# Patient Record
Sex: Female | Born: 1945
Health system: Southern US, Community
[De-identification: ages and names within clinical notes are randomized; demographics above are authoritative.]

## PROBLEM LIST (undated history)

## (undated) DIAGNOSIS — M797 Fibromyalgia: Secondary | ICD-10-CM

## (undated) DIAGNOSIS — E039 Hypothyroidism, unspecified: Secondary | ICD-10-CM

## (undated) DIAGNOSIS — I10 Essential (primary) hypertension: Secondary | ICD-10-CM

## (undated) DIAGNOSIS — K862 Cyst of pancreas: Secondary | ICD-10-CM

## (undated) DIAGNOSIS — R5382 Chronic fatigue, unspecified: Secondary | ICD-10-CM

## (undated) DIAGNOSIS — I493 Ventricular premature depolarization: Secondary | ICD-10-CM

## (undated) DIAGNOSIS — H8109 Meniere's disease, unspecified ear: Secondary | ICD-10-CM

## (undated) DIAGNOSIS — R7303 Prediabetes: Secondary | ICD-10-CM

## (undated) DIAGNOSIS — Z973 Presence of spectacles and contact lenses: Secondary | ICD-10-CM

## (undated) DIAGNOSIS — N189 Chronic kidney disease, unspecified: Secondary | ICD-10-CM

## (undated) DIAGNOSIS — E785 Hyperlipidemia, unspecified: Secondary | ICD-10-CM

## (undated) DIAGNOSIS — Z972 Presence of dental prosthetic device (complete) (partial): Secondary | ICD-10-CM

## (undated) DIAGNOSIS — K0381 Cracked tooth: Secondary | ICD-10-CM

## (undated) DIAGNOSIS — N950 Postmenopausal bleeding: Secondary | ICD-10-CM

## (undated) DIAGNOSIS — K760 Fatty (change of) liver, not elsewhere classified: Secondary | ICD-10-CM

## (undated) DIAGNOSIS — Z8673 Personal history of transient ischemic attack (TIA), and cerebral infarction without residual deficits: Secondary | ICD-10-CM

## (undated) DIAGNOSIS — Z794 Long term (current) use of insulin: Secondary | ICD-10-CM

## (undated) DIAGNOSIS — E119 Type 2 diabetes mellitus without complications: Secondary | ICD-10-CM

## (undated) DIAGNOSIS — R32 Unspecified urinary incontinence: Secondary | ICD-10-CM

## (undated) DIAGNOSIS — G4733 Obstructive sleep apnea (adult) (pediatric): Secondary | ICD-10-CM

## (undated) DIAGNOSIS — J45909 Unspecified asthma, uncomplicated: Secondary | ICD-10-CM

## (undated) DIAGNOSIS — Z8679 Personal history of other diseases of the circulatory system: Secondary | ICD-10-CM

## (undated) DIAGNOSIS — K219 Gastro-esophageal reflux disease without esophagitis: Secondary | ICD-10-CM

## (undated) DIAGNOSIS — K449 Diaphragmatic hernia without obstruction or gangrene: Secondary | ICD-10-CM

## (undated) DIAGNOSIS — D3502 Benign neoplasm of left adrenal gland: Secondary | ICD-10-CM

## (undated) DIAGNOSIS — F32A Depression, unspecified: Secondary | ICD-10-CM

## (undated) DIAGNOSIS — K589 Irritable bowel syndrome without diarrhea: Secondary | ICD-10-CM

## (undated) DIAGNOSIS — M1990 Unspecified osteoarthritis, unspecified site: Secondary | ICD-10-CM

## (undated) DIAGNOSIS — R600 Localized edema: Secondary | ICD-10-CM

## (undated) DIAGNOSIS — G9332 Myalgic encephalomyelitis/chronic fatigue syndrome: Secondary | ICD-10-CM

## (undated) DIAGNOSIS — T7840XA Allergy, unspecified, initial encounter: Secondary | ICD-10-CM

## (undated) DIAGNOSIS — G4730 Sleep apnea, unspecified: Secondary | ICD-10-CM

## (undated) DIAGNOSIS — Z860101 Personal history of adenomatous and serrated colon polyps: Secondary | ICD-10-CM

## (undated) DIAGNOSIS — M8580 Other specified disorders of bone density and structure, unspecified site: Secondary | ICD-10-CM

## (undated) DIAGNOSIS — N183 Chronic kidney disease, stage 3 unspecified: Secondary | ICD-10-CM

## (undated) DIAGNOSIS — H269 Unspecified cataract: Secondary | ICD-10-CM

## (undated) DIAGNOSIS — I639 Cerebral infarction, unspecified: Secondary | ICD-10-CM

## (undated) DIAGNOSIS — Z86010 Personal history of colonic polyps: Secondary | ICD-10-CM

## (undated) DIAGNOSIS — K0889 Other specified disorders of teeth and supporting structures: Secondary | ICD-10-CM

## (undated) HISTORY — DX: Fibromyalgia: M79.7

## (undated) HISTORY — DX: Unspecified asthma, uncomplicated: J45.909

## (undated) HISTORY — DX: Depression, unspecified: F32.A

## (undated) HISTORY — DX: Irritable bowel syndrome, unspecified: K58.9

## (undated) HISTORY — DX: Type 2 diabetes mellitus without complications: E11.9

## (undated) HISTORY — PX: TUBAL LIGATION: SHX77

## (undated) HISTORY — DX: Meniere's disease, unspecified ear: H81.09

## (undated) HISTORY — DX: Hyperlipidemia, unspecified: E78.5

## (undated) HISTORY — DX: Sleep apnea, unspecified: G47.30

## (undated) HISTORY — DX: Unspecified cataract: H26.9

## (undated) HISTORY — DX: Chronic kidney disease, unspecified: N18.9

## (undated) HISTORY — DX: Cerebral infarction, unspecified: I63.9

## (undated) HISTORY — DX: Chronic fatigue, unspecified: R53.82

## (undated) HISTORY — DX: Gastro-esophageal reflux disease without esophagitis: K21.9

## (undated) HISTORY — DX: Myalgic encephalomyelitis/chronic fatigue syndrome: G93.32

## (undated) HISTORY — PX: HERNIA REPAIR: SHX51

## (undated) HISTORY — DX: Other specified disorders of bone density and structure, unspecified site: M85.80

## (undated) HISTORY — DX: Allergy, unspecified, initial encounter: T78.40XA

---

## 1968-11-27 DIAGNOSIS — T7840XA Allergy, unspecified, initial encounter: Secondary | ICD-10-CM | POA: Insufficient documentation

## 1993-03-29 HISTORY — PX: CHOLECYSTECTOMY: SHX55

## 2000-07-12 ENCOUNTER — Encounter: Payer: Self-pay | Admitting: Family Medicine

## 2000-07-12 ENCOUNTER — Ambulatory Visit (HOSPITAL_COMMUNITY): Admission: RE | Admit: 2000-07-12 | Discharge: 2000-07-12 | Payer: Self-pay | Admitting: Family Medicine

## 2001-11-09 ENCOUNTER — Encounter: Payer: Self-pay | Admitting: Family Medicine

## 2001-11-09 ENCOUNTER — Ambulatory Visit (HOSPITAL_COMMUNITY): Admission: RE | Admit: 2001-11-09 | Discharge: 2001-11-09 | Payer: Self-pay | Admitting: Family Medicine

## 2001-11-16 ENCOUNTER — Ambulatory Visit: Admission: RE | Admit: 2001-11-16 | Discharge: 2001-11-16 | Payer: Self-pay | Admitting: Family Medicine

## 2002-11-14 HISTORY — PX: HYSTEROSCOPY WITH D & C: SHX1775

## 2003-02-08 HISTORY — PX: ESOPHAGOGASTRODUODENOSCOPY: SHX1529

## 2004-01-29 HISTORY — PX: LIVER BIOPSY: SHX301

## 2005-03-29 DIAGNOSIS — F909 Attention-deficit hyperactivity disorder, unspecified type: Secondary | ICD-10-CM

## 2005-03-29 HISTORY — DX: Attention-deficit hyperactivity disorder, unspecified type: F90.9

## 2009-05-06 ENCOUNTER — Ambulatory Visit (HOSPITAL_COMMUNITY): Admission: RE | Admit: 2009-05-06 | Discharge: 2009-05-06 | Payer: Self-pay | Admitting: Family Medicine

## 2009-05-15 ENCOUNTER — Ambulatory Visit (HOSPITAL_COMMUNITY): Admission: RE | Admit: 2009-05-15 | Discharge: 2009-05-15 | Payer: Self-pay | Admitting: Family Medicine

## 2010-05-07 ENCOUNTER — Other Ambulatory Visit (HOSPITAL_COMMUNITY): Payer: Self-pay | Admitting: Family Medicine

## 2010-05-07 DIAGNOSIS — Z139 Encounter for screening, unspecified: Secondary | ICD-10-CM

## 2010-05-14 ENCOUNTER — Ambulatory Visit (HOSPITAL_COMMUNITY)
Admission: RE | Admit: 2010-05-14 | Discharge: 2010-05-14 | Disposition: A | Discharge status: Home / Self Care | Payer: Medicare HMO | Source: Ambulatory Visit | Attending: Family Medicine | Admitting: Family Medicine

## 2010-05-14 ENCOUNTER — Other Ambulatory Visit (HOSPITAL_COMMUNITY): Payer: Self-pay | Admitting: Family Medicine

## 2010-05-14 DIAGNOSIS — R0600 Dyspnea, unspecified: Secondary | ICD-10-CM

## 2010-05-14 DIAGNOSIS — Z139 Encounter for screening, unspecified: Secondary | ICD-10-CM

## 2010-05-14 DIAGNOSIS — Z1231 Encounter for screening mammogram for malignant neoplasm of breast: Secondary | ICD-10-CM | POA: Insufficient documentation

## 2011-07-27 ENCOUNTER — Telehealth: Payer: Self-pay

## 2011-07-27 NOTE — Telephone Encounter (Signed)
Pt called to schedule colonoscopy. Referred by Dr. Sallee Lange and Pearson Forster, NP. She has family history of colon rectal cancer in her brother. She also has diarrhea and constipation due to IBS. She is scheduled for OV with Vickey Huger on 08/03/2011 @ 2:30 PM.

## 2011-08-03 ENCOUNTER — Ambulatory Visit: Payer: Medicare HMO | Admitting: Urgent Care

## 2011-08-19 ENCOUNTER — Ambulatory Visit (INDEPENDENT_AMBULATORY_CARE_PROVIDER_SITE_OTHER): Payer: Medicare Other | Admitting: Otolaryngology

## 2011-08-19 DIAGNOSIS — H6980 Other specified disorders of Eustachian tube, unspecified ear: Secondary | ICD-10-CM

## 2011-08-19 DIAGNOSIS — H902 Conductive hearing loss, unspecified: Secondary | ICD-10-CM

## 2011-08-19 DIAGNOSIS — H6520 Chronic serous otitis media, unspecified ear: Secondary | ICD-10-CM

## 2011-08-19 DIAGNOSIS — H8109 Meniere's disease, unspecified ear: Secondary | ICD-10-CM

## 2011-09-02 ENCOUNTER — Ambulatory Visit (INDEPENDENT_AMBULATORY_CARE_PROVIDER_SITE_OTHER): Payer: Medicare Other | Admitting: Otolaryngology

## 2011-09-02 DIAGNOSIS — H6980 Other specified disorders of Eustachian tube, unspecified ear: Secondary | ICD-10-CM

## 2011-09-02 DIAGNOSIS — H902 Conductive hearing loss, unspecified: Secondary | ICD-10-CM

## 2011-09-02 DIAGNOSIS — H6520 Chronic serous otitis media, unspecified ear: Secondary | ICD-10-CM

## 2011-09-02 DIAGNOSIS — J310 Chronic rhinitis: Secondary | ICD-10-CM

## 2011-09-06 ENCOUNTER — Other Ambulatory Visit: Payer: Self-pay | Admitting: Nurse Practitioner

## 2011-09-06 DIAGNOSIS — Z139 Encounter for screening, unspecified: Secondary | ICD-10-CM

## 2011-09-07 ENCOUNTER — Inpatient Hospital Stay (HOSPITAL_COMMUNITY): Admission: RE | Admit: 2011-09-07 | Payer: Medicare Other | Source: Ambulatory Visit

## 2011-10-04 ENCOUNTER — Ambulatory Visit (INDEPENDENT_AMBULATORY_CARE_PROVIDER_SITE_OTHER): Payer: Medicare Other | Admitting: Urgent Care

## 2011-10-04 ENCOUNTER — Encounter: Payer: Self-pay | Admitting: Urgent Care

## 2011-10-04 ENCOUNTER — Other Ambulatory Visit: Payer: Self-pay | Admitting: Gastroenterology

## 2011-10-04 VITALS — BP 130/80 | HR 76 | Temp 98.2°F | Ht 60.0 in | Wt 211.6 lb

## 2011-10-04 DIAGNOSIS — K589 Irritable bowel syndrome without diarrhea: Secondary | ICD-10-CM | POA: Insufficient documentation

## 2011-10-04 DIAGNOSIS — Z800 Family history of malignant neoplasm of digestive organs: Secondary | ICD-10-CM

## 2011-10-04 DIAGNOSIS — K219 Gastro-esophageal reflux disease without esophagitis: Secondary | ICD-10-CM | POA: Insufficient documentation

## 2011-10-04 MED ORDER — PEG 3350-KCL-NA BICARB-NACL 420 G PO SOLR: ORAL | Status: AC

## 2011-10-04 MED ORDER — PANTOPRAZOLE SODIUM 40 MG PO TBEC: 40.0000 mg | DELAYED_RELEASE_TABLET | Freq: Every day | ORAL | Status: DC

## 2011-10-04 NOTE — Assessment & Plan Note (Signed)
Due for high-risk screening colonoscopy.  I have discussed risks & benefits which include, but are not limited to, bleeding, infection, perforation & drug reaction.  The patient agrees with this plan & written consent will be obtained.

## 2011-10-04 NOTE — Progress Notes (Signed)
Referring Provider: Kathyrn Drown, MD Primary Care Physician:  Sallee Lange, MD Primary Gastroenterologist:  Dr. Barney Drain  Chief Complaint  Patient presents with  . Colonoscopy    Family Hx colobn ca (brother)  . Gastrophageal Reflux    HPI:  Kristen Mack is a 66 y.o. female here as a referral from Dr. Wolfgang Phoenix for high-risk screening colonoscopy.   Denies any lower GI symptoms including constipation, diarrhea, rectal bleeding, melena or weight loss.  She gives hx of IBS lifelong.  She has bloating after eating.  She wants to be tested for celiac disease.  She also tells me she has been off PPI for almost 5 years.  More recently she has been having daily heartburn & indigestion.  She has been taking "stomach soothe" herbal supplement.  2 hrs after eating, she has lots of reflux.  C/o intermittent nausea & feels she may have gastroparesis.  No recent vomiting.  She has also tried digestive enzymes & papaya.  Reflux wakes her up in the middle of the night.  If she lies on her right side, she has regurgitation.  Denies dysphagia.  She had an EGD many years ago & believes she had "inflammation"at that time.  C/o lots of belching & sulfer-smelling rotten belches. Weight has steadily increased.  Appetite is ok.  Denies dysphagia or odynophagia.  Reports good relief w/ prevacid in the past.  Tried zantac & omeprazole previously but felt prevacid worked the best as far as she can remember.  Past Medical History  Diagnosis Date  . HTN (hypertension)   . Hyperglycemia   . GERD (gastroesophageal reflux disease)   . Asthma   . Bronchitis   . Hypothyroid   . CFS (chronic fatigue syndrome)   . Fibromyalgia     Past Surgical History  Procedure Date  . Cesarean section     x 2  . Cholecystectomy   . Esophagogastroduodenoscopy     Dr. Inda Castle ([place]) erosive esophagitis?  . Dilation and curettage of uterus     Current Outpatient Prescriptions  Medication Sig Dispense Refill  .  albuterol (PROVENTIL HFA;VENTOLIN HFA) 108 (90 BASE) MCG/ACT inhaler Inhale 2 puffs into the lungs every 6 (six) hours as needed.      . citalopram (CELEXA) 40 MG tablet Take 40 mg by mouth as needed.       . fluticasone (FLONASE) 50 MCG/ACT nasal spray Place 1 spray into the nose as needed.       Marland Kitchen ipratropium (ATROVENT HFA) 17 MCG/ACT inhaler Inhale 2 puffs into the lungs every 6 (six) hours.      Marland Kitchen ipratropium (ATROVENT) 0.02 % nebulizer solution Take 500 mcg by nebulization 4 (four) times daily.      Marland Kitchen levothyroxine (SYNTHROID, LEVOTHROID) 150 MCG tablet Take 150 mcg by mouth daily.       Marland Kitchen losartan (COZAAR) 50 MG tablet Take 100 mg by mouth daily.       . montelukast (SINGULAIR) 10 MG tablet Take 10 mg by mouth at bedtime.       . potassium chloride SA (K-DUR,KLOR-CON) 20 MEQ tablet Take 20 mEq by mouth daily.      . verapamil (VERELAN PM) 180 MG 24 hr capsule Take 180 mg by mouth at bedtime.       . pantoprazole (PROTONIX) 40 MG tablet Take 1 tablet (40 mg total) by mouth daily.  31 tablet  11  . polyethylene glycol-electrolytes (TRILYTE) 420 G solution Use as directed Also buy  1 fleet enema & 4 dulcolax tablets to use as directed  4000 mL  0    Allergies as of 10/04/2011 - Review Complete 10/04/2011  Allergen Reaction Noted  . Codeine Itching and Nausea And Vomiting 10/04/2011  . Sulfa antibiotics Other (See Comments) 10/04/2011    Family History  Problem Relation Age of Onset  . Rectal cancer Brother 75  . Ovarian cancer Mother 65    History   Social History  . Marital Status: Married    Spouse Name: N/A    Number of Children: 2  . Years of Education: N/A   Occupational History  . retired, Therapist, sports    Social History Main Topics  . Smoking status: Former Smoker -- 0.5 packs/day    Types: Cigarettes    Quit date: 10/04/1971  . Smokeless tobacco: Not on file   Comment: quit years ago  . Alcohol Use: No  . Drug Use: No  . Sexually Active: Not on file  Review of  Systems: Gen: Denies any fever, chills, sweats, anorexia, fatigue, weakness, malaise, weight loss, and sleep disorder CV: wt gain 6#, increased LEE since prednisone.  Denies chest pain, angina, palpitations, syncope, orthopnea, PND, and claudication. Resp: Denies dyspnea at rest, dyspnea with exercise, cough, sputum, wheezing, coughing up blood, and pleurisy. GI: Denies vomiting blood, jaundice, and fecal incontinence.   Denies dysphagia or odynophagia. GU : Denies urinary burning, blood in urine, urinary frequency, urinary hesitancy, nocturnal urination, and urinary incontinence. MS: Denies joint pain, limitation of movement, and swelling, stiffness, low back pain, extremity pain. Denies muscle weakness, cramps, atrophy.  Derm: Denies rash, itching, dry skin, hives, moles, warts, or unhealing ulcers.  Psych: Denies depression, anxiety, memory loss, suicidal ideation, hallucinations, paranoia, and confusion. Heme: Denies bruising, bleeding, and enlarged lymph nodes. Neuro:  Denies any headaches, dizziness, paresthesias. Endo:  Thyroid meds recently adjusted, problems w/ hyperglycemia, recent steroids, "borderline" DM  Physical Exam: BP 130/80  Pulse 76  Temp 98.2 F (36.8 C) (Temporal)  Ht 5' (1.524 m)  Wt 211 lb 9.6 oz (95.981 kg)  BMI 41.33 kg/m2 General:   Alert,  Well-developed, obese, pleasant and cooperative in NAD Head:  Normocephalic and atraumatic. Eyes:  Sclera clear, no icterus.   Conjunctiva pink. Ears:  Normal auditory acuity. Nose:  No deformity, discharge, or lesions. Mouth:  No deformity or lesions,oropharynx pink & moist. Neck:  Supple; no masses or thyromegaly. Lungs:  Clear throughout to auscultation.   No wheezes, crackles, or rhonchi. No acute distress. Heart:  Regular rate and rhythm; no murmurs, clicks, rubs,  or gallops. Abdomen:  Obese.  Normal bowel sounds.  No bruits.  Soft, non-tender and non-distended without masses, hepatosplenomegaly or hernias noted.  No  guarding or rebound tenderness.  Exam limited due to body habitus. Rectal:  Deferred. Msk:  Symmetrical without gross deformities. Normal posture. Pulses:  Normal pulses noted. Extremities:  Trace bilateral pretibial edema. Neurologic:  Alert and oriented x4;  grossly normal neurologically. Skin:  Intact without significant lesions or rashes. Lymph Nodes:  No significant cervical adenopathy. Psych:  Alert and cooperative. Normal mood and affect.

## 2011-10-04 NOTE — Assessment & Plan Note (Signed)
Chronic IBS with bloating & post-prandial component.  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  I fell this is warranted.  No alarm features at this time.   TTG IgA

## 2011-10-04 NOTE — Assessment & Plan Note (Addendum)
Hx of GERD years ago, however recent severe daily symptoms, along with chronic nausea, warrant further evaluation w/ EGD to look for PUD, gastritis, gastroparesis, or malignancy. Increased weight may be exacerbating GERD.  I have discussed risks & benefits which include, but are not limited to, bleeding, infection, perforation & drug reaction.  The patient agrees with this plan & written consent will be obtained.    Begin pantoprazole 40 mg 30 minutes before breakfast GERD diet Weight loss

## 2011-10-04 NOTE — Patient Instructions (Addendum)
Go to lab as soon as possible Begin pantoprazole 40 mg 30 minutes before breakfast GERD diet EGD & colonoscopy w/ Dr Oneida Alar Diet for GERD or PUD Nutrition therapy can help ease the discomfort of gastroesophageal reflux disease (GERD) and peptic ulcer disease (PUD).  HOME CARE INSTRUCTIONS   Eat your meals slowly, in a relaxed setting.   Eat 5 to 6 small meals per day.   If a food causes distress, stop eating it for a period of time.  FOODS TO AVOID  Coffee, regular or decaffeinated.   Cola beverages, regular or low calorie.   Tea, regular or decaffeinated.   Pepper.   Cocoa.   High fat foods, including meats.   Butter, margarine, hydrogenated oil (trans fats).   Peppermint or spearmint (if you have GERD).   Fruits and vegetables if not tolerated.   Alcohol.   Nicotine (smoking or chewing). This is one of the most potent stimulants to acid production in the gastrointestinal tract.   Any food that seems to aggravate your condition.  If you have questions regarding your diet, ask your caregiver or a registered dietitian. TIPS  Lying flat may make symptoms worse. Keep the head of your bed raised 6 to 9 inches (15 to 23 cm) by using a foam wedge or blocks under the legs of the bed.   Do not lay down until 3 hours after eating a meal.   Daily physical activity may help reduce symptoms.  MAKE SURE YOU:   Understand these instructions.   Will watch your condition.   Will get help right away if you are not doing well or get worse.  Document Released: 03/15/2005 Document Revised: 03/04/2011 Document Reviewed: 01/29/2011 [facility] Patient Information 2012 [place].

## 2011-10-04 NOTE — Progress Notes (Signed)
Faxed to PCP

## 2011-10-05 ENCOUNTER — Encounter: Payer: Self-pay | Admitting: Urgent Care

## 2011-10-05 ENCOUNTER — Telehealth: Payer: Self-pay

## 2011-10-05 LAB — TISSUE TRANSGLUTAMINASE, IGA: Tissue Transglutaminase Ab, IgA: 6.1 U/mL (ref <20)

## 2011-10-05 NOTE — Telephone Encounter (Signed)
Pt called and said Brennan Bailey was supposed to have sent her prescription for Pantoprazole to [facility] in [place] yesterday and they do not have it. I called it in verbally to [facility] at [facility].

## 2011-10-05 NOTE — Telephone Encounter (Signed)
Records reviewed from [facility] updated

## 2011-10-05 NOTE — Progress Notes (Signed)
Quick Note:  Please call the patient & let her know there is no evidence of celiac disease based on her labs. CC: LUKING,SCOTT, MD  ______

## 2011-10-05 NOTE — Telephone Encounter (Signed)
thanks

## 2011-10-06 NOTE — Progress Notes (Signed)
Quick Note:  Called and informed pt. ______

## 2011-10-07 ENCOUNTER — Ambulatory Visit (INDEPENDENT_AMBULATORY_CARE_PROVIDER_SITE_OTHER): Payer: Medicare Other | Admitting: Otolaryngology

## 2011-10-07 DIAGNOSIS — H8109 Meniere's disease, unspecified ear: Secondary | ICD-10-CM

## 2011-10-07 DIAGNOSIS — H7200 Central perforation of tympanic membrane, unspecified ear: Secondary | ICD-10-CM

## 2011-10-07 DIAGNOSIS — H6980 Other specified disorders of Eustachian tube, unspecified ear: Secondary | ICD-10-CM

## 2011-10-14 ENCOUNTER — Encounter (HOSPITAL_COMMUNITY): Payer: Self-pay | Admitting: Pharmacy Technician

## 2011-10-25 ENCOUNTER — Other Ambulatory Visit: Payer: Self-pay | Admitting: Gastroenterology

## 2011-10-25 ENCOUNTER — Telehealth: Payer: Self-pay | Admitting: Gastroenterology

## 2011-10-25 MED ORDER — PEG 3350-KCL-NA BICARB-NACL 420 G PO SOLR: ORAL | Status: AC

## 2011-10-25 NOTE — Telephone Encounter (Signed)
Pt called to let us know that she has lost her rx of Tri-Lyte and needs another one. I told her we would fax it to her pharmacy. She uses [facility] in [place]

## 2011-10-25 NOTE — Telephone Encounter (Signed)
Rx faxed to pharmacy  

## 2011-10-27 ENCOUNTER — Telehealth: Payer: Self-pay | Admitting: Gastroenterology

## 2011-10-27 NOTE — Telephone Encounter (Signed)
Patient called and cancelled her procedure for Friday 08/02 and she said she will call us back to R/S

## 2011-10-29 ENCOUNTER — Ambulatory Visit (HOSPITAL_COMMUNITY): Admission: RE | Admit: 2011-10-29 | Payer: Medicare Other | Source: Ambulatory Visit | Admitting: Gastroenterology

## 2011-10-29 ENCOUNTER — Encounter (HOSPITAL_COMMUNITY): Admission: RE | Payer: Self-pay | Source: Ambulatory Visit

## 2011-10-29 SURGERY — COLONOSCOPY WITH ESOPHAGOGASTRODUODENOSCOPY (EGD)
Anesthesia: Moderate Sedation

## 2011-11-16 NOTE — Progress Notes (Signed)
REVIEWED.  

## 2012-04-27 ENCOUNTER — Ambulatory Visit (INDEPENDENT_AMBULATORY_CARE_PROVIDER_SITE_OTHER): Payer: Medicare Other | Admitting: Otolaryngology

## 2012-04-27 DIAGNOSIS — H6120 Impacted cerumen, unspecified ear: Secondary | ICD-10-CM

## 2012-04-27 DIAGNOSIS — H7200 Central perforation of tympanic membrane, unspecified ear: Secondary | ICD-10-CM

## 2012-04-27 DIAGNOSIS — H8109 Meniere's disease, unspecified ear: Secondary | ICD-10-CM

## 2012-04-30 DIAGNOSIS — R32 Unspecified urinary incontinence: Secondary | ICD-10-CM | POA: Insufficient documentation

## 2012-06-26 ENCOUNTER — Other Ambulatory Visit: Payer: Self-pay | Admitting: Family Medicine

## 2012-06-27 LAB — TSH: TSH: 0.082 u[IU]/mL — ABNORMAL LOW (ref 0.350–4.500)

## 2012-07-01 ENCOUNTER — Encounter: Payer: Self-pay | Admitting: *Deleted

## 2012-07-06 ENCOUNTER — Encounter: Payer: Self-pay | Admitting: Nurse Practitioner

## 2012-07-06 ENCOUNTER — Ambulatory Visit (INDEPENDENT_AMBULATORY_CARE_PROVIDER_SITE_OTHER): Payer: Medicare Other | Admitting: Nurse Practitioner

## 2012-07-06 VITALS — BP 152/90 | Temp 90.0°F | Ht 60.0 in | Wt 205.0 lb

## 2012-07-06 DIAGNOSIS — Z0000 Encounter for general adult medical examination without abnormal findings: Secondary | ICD-10-CM

## 2012-07-06 DIAGNOSIS — M8580 Other specified disorders of bone density and structure, unspecified site: Secondary | ICD-10-CM

## 2012-07-06 DIAGNOSIS — M949 Disorder of cartilage, unspecified: Secondary | ICD-10-CM

## 2012-07-06 DIAGNOSIS — Z139 Encounter for screening, unspecified: Secondary | ICD-10-CM

## 2012-07-06 MED ORDER — MONTELUKAST SODIUM 10 MG PO TABS: 10.0000 mg | ORAL_TABLET | Freq: Every day | ORAL | Status: DC

## 2012-07-06 MED ORDER — IPRATROPIUM BROMIDE HFA 17 MCG/ACT IN AERS: 2.0000 | INHALATION_SPRAY | Freq: Four times a day (QID) | RESPIRATORY_TRACT | Status: DC

## 2012-07-06 NOTE — Progress Notes (Signed)
Pt requests to make own app. For bone density and mammogram.

## 2012-07-07 ENCOUNTER — Telehealth: Payer: Self-pay | Admitting: Family Medicine

## 2012-07-07 ENCOUNTER — Other Ambulatory Visit: Payer: Self-pay | Admitting: *Deleted

## 2012-07-07 MED ORDER — IPRATROPIUM BROMIDE 0.02 % IN SOLN: 500.0000 ug | Freq: Four times a day (QID) | RESPIRATORY_TRACT | Status: DC | PRN

## 2012-07-07 NOTE — Telephone Encounter (Signed)
Pt notified neb med sent to [facility] with 5 refills

## 2012-07-07 NOTE — Telephone Encounter (Signed)
Patient says that she came in here yesterday for her appointment and had some medications called in, but the nurse called in the wrong prescription. Instead of Atrovent nebulizer being called in, the atrovent inhaler was called in. Please advise. [facility] in [place].

## 2012-07-08 ENCOUNTER — Encounter: Payer: Self-pay | Admitting: Nurse Practitioner

## 2012-07-08 DIAGNOSIS — M8580 Other specified disorders of bone density and structure, unspecified site: Secondary | ICD-10-CM | POA: Insufficient documentation

## 2012-07-08 NOTE — Progress Notes (Signed)
Subjective:  Presents for her preventive health physical. No pelvic pain. No vaginal bleeding. Same sexual partner. No vaginal discharge. Gets regular eye and dental exams. Takes some calcium and vitamin D. Defers Pneumovax and Zostavax vaccines. Does fairly well her diet. Limited exercise.  Objective:   BP 152/90  Temp(Src) 90 F (32.2 C)  Ht 5' (1.524 m)  Wt 205 lb (92.987 kg)  BMI 40.04 kg/m2 NAD. Alert, oriented. TMs mild clear effusion, no erythema. Pharynx clear. Neck supple with minimal adenopathy. Thyroid normal to palpation. Lungs clear. Heart regular rate rhythm. Abdomen soft nondistended nontender. Breast exam normal limit, axilla no adenopathy. EGBUS normal. Bimanual exam normal limit, limited due to abdominal girth. See labs dated 03/10/12.  Assessment: Preventive health physical Morbid obesity Osteopenia Plan: Patient to call to schedule her colonoscopy. Had to reschedule her last one due to her husband's illness. Schedule mammogram and bone density study. Discussed importance of healthy diet activity and weight loss. Recheck 3 months, call back sooner if any problems.

## 2012-07-24 ENCOUNTER — Other Ambulatory Visit: Payer: Self-pay | Admitting: Family Medicine

## 2012-08-30 ENCOUNTER — Other Ambulatory Visit: Payer: Self-pay | Admitting: Family Medicine

## 2012-10-10 ENCOUNTER — Telehealth: Payer: Self-pay | Admitting: Family Medicine

## 2012-10-10 DIAGNOSIS — E119 Type 2 diabetes mellitus without complications: Secondary | ICD-10-CM

## 2012-10-10 DIAGNOSIS — E782 Mixed hyperlipidemia: Secondary | ICD-10-CM

## 2012-10-10 DIAGNOSIS — Z79899 Other long term (current) drug therapy: Secondary | ICD-10-CM

## 2012-10-10 NOTE — Telephone Encounter (Signed)
Lip/liv/hgA1C/met 7

## 2012-10-10 NOTE — Telephone Encounter (Signed)
Patient needs BW paper

## 2012-10-10 NOTE — Telephone Encounter (Signed)
Blood work papers printed and left up front for patient pick up. Patient notified.

## 2012-10-10 NOTE — Telephone Encounter (Signed)
Please call patient when complete

## 2012-10-10 NOTE — Telephone Encounter (Signed)
Had TSH and Vit D labs on 06/26/12

## 2012-10-11 ENCOUNTER — Telehealth: Payer: Self-pay | Admitting: Family Medicine

## 2012-10-11 ENCOUNTER — Other Ambulatory Visit: Payer: Self-pay | Admitting: Family Medicine

## 2012-10-11 ENCOUNTER — Other Ambulatory Visit: Payer: Self-pay

## 2012-10-11 DIAGNOSIS — E039 Hypothyroidism, unspecified: Secondary | ICD-10-CM

## 2012-10-11 LAB — LIPID PANEL
LDL Cholesterol: 115 mg/dL — ABNORMAL HIGH (ref 0–99)
Triglycerides: 104 mg/dL (ref <150)

## 2012-10-11 LAB — HEPATIC FUNCTION PANEL
AST: 49 U/L — ABNORMAL HIGH (ref 0–37)
Albumin: 3.8 g/dL (ref 3.5–5.2)
Bilirubin, Direct: 0.1 mg/dL (ref 0.0–0.3)
Total Bilirubin: 0.5 mg/dL (ref 0.3–1.2)

## 2012-10-11 LAB — BASIC METABOLIC PANEL
BUN: 12 mg/dL (ref 6–23)
Potassium: 3.8 mEq/L (ref 3.5–5.3)
Sodium: 140 mEq/L (ref 135–145)

## 2012-10-11 LAB — HEMOGLOBIN A1C
Hgb A1c MFr Bld: 5.7 % — ABNORMAL HIGH (ref <5.7)
Mean Plasma Glucose: 117 mg/dL — ABNORMAL HIGH (ref <117)

## 2012-10-11 NOTE — Telephone Encounter (Signed)
Left message on voicemail notifying patient lab papers ready for pickup.

## 2012-10-11 NOTE — Telephone Encounter (Signed)
Pt is wanting to add an THS to her labs, can we please do so? Dr Nicki Reaper did not order this on her labs and pt thinks she should have one run since he changed her Thyroid meds 3 months ago.  Dr Nicki Reaper typically will run this after a change to decide if she needs to continue with current dose, stop dose all together or change? Please advise of add lab, pt was trying to get this done this morning.

## 2012-10-11 NOTE — Telephone Encounter (Signed)
Add tsh

## 2012-10-11 NOTE — Telephone Encounter (Signed)
Pt states she will run out of her current thyroid meds in four days, should she just get a refill on her current dose if Dr Richardson Landry does not want to add the THS testing?

## 2012-10-23 ENCOUNTER — Encounter: Payer: Self-pay | Admitting: Family Medicine

## 2012-10-23 ENCOUNTER — Ambulatory Visit (INDEPENDENT_AMBULATORY_CARE_PROVIDER_SITE_OTHER): Payer: Self-pay | Admitting: Family Medicine

## 2012-10-23 VITALS — BP 118/78 | Wt 211.0 lb

## 2012-10-23 DIAGNOSIS — E039 Hypothyroidism, unspecified: Secondary | ICD-10-CM

## 2012-10-23 DIAGNOSIS — E038 Other specified hypothyroidism: Secondary | ICD-10-CM | POA: Insufficient documentation

## 2012-10-23 NOTE — Progress Notes (Signed)
  Subjective:    Patient ID: Kristen Mack, female    DOB: 1945-08-10, 67 y.o.   MRN: [id number]  HPIHere for a follow up on bloodwork. Wants to have bloodwork done to test TSH.  Patient recently had lab work she is watching her diet. She knows that she needs to lose weight.  Review of Systems Denies abdominal pain discomfort vomiting diarrhea fever chills    Objective:   Physical Exam Lungs are clear hearts regular abdomen soft obese extremities no edema  All lab work was reviewed in detail       Assessment & Plan:  #1 prediabetes low starch diet regular exercise bring weight down by 10% patient states she hopes to lose 10 pounds over the next 3 months and walked at least 3 days a week  Elevated liver enzymes we will recheck lab work again in 3 months time reduce weight. Patient states she has been diagnosed previously with abnormal liver enzymes/fatty liver   hyperlipidemia mild dietary measures discussed  Hypothyroidism check lab work await results

## 2012-10-23 NOTE — Patient Instructions (Addendum)
DASH Diet The DASH diet stands for "Dietary Approaches to Stop Hypertension." It is a healthy eating plan that has been shown to reduce high blood pressure (hypertension) in as little as 14 days, while also possibly providing other significant health benefits. These other health benefits include reducing the risk of breast cancer after menopause and reducing the risk of type 2 diabetes, heart disease, colon cancer, and stroke. Health benefits also include weight loss and slowing kidney failure in patients with chronic kidney disease.  DIET GUIDELINES  Limit salt (sodium). Your diet should contain less than 1500 mg of sodium daily.  Limit refined or processed carbohydrates. Your diet should include mostly whole grains. Desserts and added sugars should be used sparingly.  Include small amounts of heart-healthy fats. These types of fats include nuts, oils, and tub margarine. Limit saturated and trans fats. These fats have been shown to be harmful in the body. CHOOSING FOODS  The following food groups are based on a 2000 calorie diet. See your Registered Dietitian for individual calorie needs. Grains and Grain Products (6 to 8 servings daily)  Eat More Often: Whole-wheat bread, brown rice, whole-grain or wheat pasta, quinoa, popcorn without added fat or salt (air popped).  Eat Less Often: White bread, white pasta, white rice, cornbread. Vegetables (4 to 5 servings daily)  Eat More Often: Fresh, frozen, and canned vegetables. Vegetables may be raw, steamed, roasted, or grilled with a minimal amount of fat.  Eat Less Often/Avoid: Creamed or fried vegetables. Vegetables in a cheese sauce. Fruit (4 to 5 servings daily)  Eat More Often: All fresh, canned (in natural juice), or frozen fruits. Dried fruits without added sugar. One hundred percent fruit juice ( cup [237 mL] daily).  Eat Less Often: Dried fruits with added sugar. Canned fruit in light or heavy syrup. [employer], Fish, and Poultry (2  servings or less daily. One serving is 3 to 4 oz [85-114 g]).  Eat More Often: Ninety percent or leaner ground beef, tenderloin, sirloin. Round cuts of beef, chicken breast, [place] breast. All fish. Grill, bake, or broil your meat. Nothing should be fried.  Eat Less Often/Avoid: Fatty cuts of meat, [place], or chicken leg, thigh, or wing. Fried cuts of meat or fish. Dairy (2 to 3 servings)  Eat More Often: Low-fat or fat-free milk, low-fat plain or light yogurt, reduced-fat or part-skim cheese.  Eat Less Often/Avoid: Milk (whole, 2%).Whole milk yogurt. Full-fat cheeses. Nuts, Seeds, and Legumes (4 to 5 servings per week)  Eat More Often: All without added salt.  Eat Less Often/Avoid: Salted nuts and seeds, canned beans with added salt. Fats and Sweets (limited)  Eat More Often: Vegetable oils, tub margarines without trans fats, sugar-free gelatin. Mayonnaise and salad dressings.  Eat Less Often/Avoid: Coconut oils, palm oils, butter, stick margarine, cream, half and half, cookies, candy, pie. FOR MORE INFORMATION The Dash Diet Eating Plan: www.dashdiet.org Document Released: 03/04/2011 Document Revised: 06/07/2011 Document Reviewed: 03/04/2011 [facility] Patient Information 2014 [place], [place].   Fatty Liver Fatty liver is the accumulation of fat in liver cells. It is also called hepatosteatosis or steatohepatitis. It is normal for your liver to contain some fat. If fat is more than 5 to 10% of your liver's weight, you have fatty liver.  There are often no symptoms (problems) for years while damage is still occurring. People often learn about their fatty liver when they have medical tests for other reasons. Fat can damage your liver for years or even decades without causing  problems. When it becomes severe, it can cause fatigue, weight loss, weakness, and confusion. This makes you more likely to develop more serious liver problems. The liver is the largest organ in the body. It does a lot  of work and often gives no warning signs when it is sick until late in a disease. The liver has many important jobs including:  Breaking down foods.  Storing vitamins, iron, and other minerals.  Making proteins.  Making bile for food digestion.  Breaking down many products including medications, alcohol and some poisons. CAUSES  There are a number of different conditions, medications, and poisons that can cause a fatty liver. Eating too many calories causes fat to build up in the liver. Not processing and breaking fats down normally may also cause this. Certain conditions, such as obesity, diabetes, and high triglycerides also cause this. Most fatty liver patients tend to be middle-aged and over weight.  Some causes of fatty liver are:  Alcohol over consumption.  Malnutrition.  Steroid use.  Valproic acid toxicity.  Obesity.  Cushing's syndrome.  Poisons.  Tetracycline in high dosages.  Pregnancy.  Diabetes.  Hyperlipidemia.  Rapid weight loss. Some people develop fatty liver even having none of these conditions. SYMPTOMS  Fatty liver most often causes no problems. This is called asymptomatic.  It can be diagnosed with blood tests and also by a liver biopsy.  It is one of the most common causes of minor elevations of liver enzymes on routine blood tests.  Specialized Imaging of the liver using ultrasound, CT (computed tomography) scan, or MRI (magnetic resonance imaging) can suggest a fatty liver but a biopsy is needed to confirm it.  A biopsy involves taking a small sample of liver tissue. This is done by using a needle. It is then looked at under a microscope by a specialist. TREATMENT  It is important to treat the cause. Simple fatty liver without a medical reason may not need treatment.  Weight loss, fat restriction, and exercise in overweight patients produces inconsistent results but is worth trying.  Fatty liver due to alcohol toxicity may not improve  even with stopping drinking.  Good control of diabetes may reduce fatty liver.  Lower your triglycerides through diet, medication or both.  Eat a balanced, healthy diet.  Increase your physical activity.  Get regular checkups from a liver specialist.  There are no medical or surgical treatments for a fatty liver or NASH, but improving your diet and increasing your exercise may help prevent or reverse some of the damage. PROGNOSIS  Fatty liver may cause no damage or it can lead to an inflammation of the liver. This is, called steatohepatitis. When it is linked to alcohol abuse, it is called alcoholic steatohepatitis. It often is not linked to alcohol. It is then called nonalcoholic steatohepatitis, or NASH. Over time the liver may become scarred and hardened. This condition is called cirrhosis. Cirrhosis is serious and may lead to liver failure or cancer. NASH is one of the leading causes of cirrhosis. About 10-20% of Americans have fatty liver and a smaller 2-5% has NASH. Document Released: 04/30/2005 Document Revised: 06/07/2011 Document Reviewed: 06/23/2005 [facility] Patient Information 2014 [place].

## 2012-10-24 ENCOUNTER — Other Ambulatory Visit: Payer: Self-pay | Admitting: *Deleted

## 2012-10-24 LAB — TSH: TSH: 0.332 u[IU]/mL — ABNORMAL LOW (ref 0.350–4.500)

## 2012-10-24 MED ORDER — CITALOPRAM HYDROBROMIDE 40 MG PO TABS: 40.0000 mg | ORAL_TABLET | ORAL | Status: DC | PRN

## 2012-10-24 MED ORDER — LOSARTAN POTASSIUM 100 MG PO TABS: 100.0000 mg | ORAL_TABLET | Freq: Every day | ORAL | Status: DC

## 2012-10-26 ENCOUNTER — Telehealth: Payer: Self-pay | Admitting: Family Medicine

## 2012-10-26 ENCOUNTER — Other Ambulatory Visit: Payer: Self-pay | Admitting: *Deleted

## 2012-10-26 DIAGNOSIS — E039 Hypothyroidism, unspecified: Secondary | ICD-10-CM

## 2012-10-26 MED ORDER — LEVOTHYROXINE SODIUM 112 MCG PO TABS: 112.0000 ug | ORAL_TABLET | Freq: Every day | ORAL | Status: DC

## 2012-10-26 NOTE — Telephone Encounter (Signed)
Please see chart for note the patient dropped off in regards to her Levothyroxine

## 2012-10-27 MED ORDER — LEVOTHYROXINE SODIUM 125 MCG PO TABS: ORAL_TABLET | ORAL | Status: DC

## 2012-10-27 NOTE — Telephone Encounter (Signed)
Discussed with patient. New dose and directions sent to [facility].

## 2012-10-27 NOTE — Telephone Encounter (Signed)
Pts note was reviewed, tell the pt the records on her electronic chart did not reflect how she was taking the levothyroxine. With the new info in mind please do the following: Switch back to 125 mcg (what I saw and what pt states she was taking did not match) Plz tell pt to stick with 134mg. Use 1 on M,W,F and 1/2 on T,TH,Sat,Sun. Then recheck TSH in 6 weeks AND please correct the med list. Thanks

## 2012-10-31 ENCOUNTER — Other Ambulatory Visit: Payer: Self-pay | Admitting: *Deleted

## 2012-10-31 MED ORDER — CITALOPRAM HYDROBROMIDE 40 MG PO TABS: 40.0000 mg | ORAL_TABLET | ORAL | Status: DC | PRN

## 2012-10-31 MED ORDER — MONTELUKAST SODIUM 10 MG PO TABS: 10.0000 mg | ORAL_TABLET | Freq: Every day | ORAL | Status: DC

## 2012-11-04 ENCOUNTER — Other Ambulatory Visit: Payer: Self-pay

## 2012-11-04 MED ORDER — FLUTICASONE PROPIONATE 50 MCG/ACT NA SUSP: 2.0000 | NASAL | Status: DC | PRN

## 2012-12-01 ENCOUNTER — Telehealth: Payer: Self-pay | Admitting: Family Medicine

## 2012-12-01 NOTE — Telephone Encounter (Signed)
Patient says that [facility] in [place] the diabetes test strips because it does not have a diagnosis code on it. She would like [place] to get in touch with [facility] to correct this.    [facility] in [place]

## 2012-12-01 NOTE — Telephone Encounter (Signed)
Spoke with the pharmacy and gave the diagnosis code for prediabetes. Patient notified.

## 2012-12-22 LAB — TSH: TSH: 11.229 u[IU]/mL — ABNORMAL HIGH (ref 0.350–4.500)

## 2013-01-15 ENCOUNTER — Other Ambulatory Visit: Payer: Self-pay | Admitting: Family Medicine

## 2013-01-15 DIAGNOSIS — Z139 Encounter for screening, unspecified: Secondary | ICD-10-CM

## 2013-01-16 ENCOUNTER — Telehealth: Payer: Self-pay | Admitting: Family Medicine

## 2013-01-16 NOTE — Telephone Encounter (Signed)
Patient dropped off a letter for Dr. Nicki Reaper.  1-Would like a Colonoscopy Screening 2-She is going to a Dental Clinic at [facility]  These are discussed in detail in the letter.  The chart was placed in Dr. Bary Leriche in basket

## 2013-01-18 NOTE — Telephone Encounter (Signed)
We have sent request to Dr Olevia Perches office BUT she needs to call them to schedule. NOTE:we do not have the power to schedule colon directly that is up to her and Dr.Rehman's office to agree upon. Thnaks.also, no need for antibiotics. They are only needed in cases of vavular disease.

## 2013-01-19 NOTE — Telephone Encounter (Signed)
Notified patient we have sent request to Dr Olevia Perches office BUT she needs to call them to schedule. NOTE:we do not have the power to schedule colon directly that is up to her and Dr.Rehman's office to agree upon. Thnaks.also, no need for antibiotics. They are only needed in cases of vavular disease. Patient verbalized understanding.

## 2013-01-23 ENCOUNTER — Ambulatory Visit (HOSPITAL_COMMUNITY)
Admission: RE | Admit: 2013-01-23 | Discharge: 2013-01-23 | Disposition: A | Discharge status: Home / Self Care | Payer: Medicare Other | Source: Ambulatory Visit | Attending: Family Medicine | Admitting: Family Medicine

## 2013-01-23 ENCOUNTER — Ambulatory Visit: Payer: Medicare Other | Admitting: Family Medicine

## 2013-01-23 DIAGNOSIS — Z1231 Encounter for screening mammogram for malignant neoplasm of breast: Secondary | ICD-10-CM | POA: Insufficient documentation

## 2013-01-23 DIAGNOSIS — Z139 Encounter for screening, unspecified: Secondary | ICD-10-CM

## 2013-01-26 ENCOUNTER — Other Ambulatory Visit: Payer: Self-pay | Admitting: Family Medicine

## 2013-01-26 DIAGNOSIS — R928 Other abnormal and inconclusive findings on diagnostic imaging of breast: Secondary | ICD-10-CM

## 2013-02-05 ENCOUNTER — Telehealth: Payer: Self-pay | Admitting: Family Medicine

## 2013-02-05 NOTE — Telephone Encounter (Signed)
Informed the patient that recently the pharmacy sent to [place] and alert. The reason is the cause of her age Kristen Mack is  discouraged. Instead it is now recommended to use baclofen 10 mg a half a tablet as needed. Discussed with the patient this. See if she will allow [place] to try baclofen.

## 2013-02-05 NOTE — Telephone Encounter (Signed)
Review Drug Therapy Alert from [facility]

## 2013-02-06 NOTE — Telephone Encounter (Signed)
Spoke with patient. She states she is not having any problems with Flexeril. She takes it ay night. Pt is going to call her pharmacy to see how much Baclofen will cost and then she will call us back.

## 2013-02-08 ENCOUNTER — Telehealth: Payer: Self-pay | Admitting: Family Medicine

## 2013-02-08 ENCOUNTER — Encounter (INDEPENDENT_AMBULATORY_CARE_PROVIDER_SITE_OTHER): Payer: Self-pay | Admitting: *Deleted

## 2013-02-08 ENCOUNTER — Other Ambulatory Visit (INDEPENDENT_AMBULATORY_CARE_PROVIDER_SITE_OTHER): Payer: Self-pay | Admitting: *Deleted

## 2013-02-08 ENCOUNTER — Telehealth (INDEPENDENT_AMBULATORY_CARE_PROVIDER_SITE_OTHER): Payer: Self-pay | Admitting: *Deleted

## 2013-02-08 DIAGNOSIS — Z1211 Encounter for screening for malignant neoplasm of colon: Secondary | ICD-10-CM

## 2013-02-08 MED ORDER — PEG-KCL-NACL-NASULF-NA ASC-C 100 G PO SOLR: 1.0000 | Freq: Once | ORAL | Status: DC

## 2013-02-08 NOTE — Telephone Encounter (Signed)
Patient needs movi prep 

## 2013-02-08 NOTE — Telephone Encounter (Signed)
Review Drug Therapy Alert from [facility]

## 2013-02-14 ENCOUNTER — Ambulatory Visit (HOSPITAL_COMMUNITY)
Admission: RE | Admit: 2013-02-14 | Discharge: 2013-02-14 | Disposition: A | Discharge status: Home / Self Care | Payer: Medicare Other | Source: Ambulatory Visit | Attending: Family Medicine | Admitting: Family Medicine

## 2013-02-14 DIAGNOSIS — R928 Other abnormal and inconclusive findings on diagnostic imaging of breast: Secondary | ICD-10-CM | POA: Insufficient documentation

## 2013-02-14 NOTE — Telephone Encounter (Signed)
It is noted that because of the patient's age we will discuss on next visit alternatives to Flexeril. A letter will be sent to the patient regarding this.

## 2013-03-06 ENCOUNTER — Telehealth: Payer: Self-pay | Admitting: Family Medicine

## 2013-03-06 NOTE — Telephone Encounter (Signed)
pts wants BW orders for appt 03/12/13  Wants to do work as soon as possible

## 2013-03-06 NOTE — Telephone Encounter (Signed)
The end of July 2014 patient had Lipid, Liver, Met 7, HgbA1C and TSH -  And the end of Sept patient had TSH

## 2013-03-07 ENCOUNTER — Other Ambulatory Visit: Payer: Self-pay | Admitting: *Deleted

## 2013-03-07 DIAGNOSIS — R739 Hyperglycemia, unspecified: Secondary | ICD-10-CM | POA: Insufficient documentation

## 2013-03-07 DIAGNOSIS — E039 Hypothyroidism, unspecified: Secondary | ICD-10-CM

## 2013-03-07 NOTE — Telephone Encounter (Signed)
Patient notified

## 2013-03-07 NOTE — Telephone Encounter (Signed)
TSH, free T4 , and HgA1C

## 2013-03-08 LAB — TSH: TSH: 0.786 u[IU]/mL (ref 0.350–4.500)

## 2013-03-08 LAB — HEMOGLOBIN A1C: Hgb A1c MFr Bld: 5.7 % — ABNORMAL HIGH (ref <5.7)

## 2013-03-12 ENCOUNTER — Encounter: Payer: Self-pay | Admitting: Family Medicine

## 2013-03-12 ENCOUNTER — Ambulatory Visit (INDEPENDENT_AMBULATORY_CARE_PROVIDER_SITE_OTHER): Payer: Medicare Other | Admitting: Family Medicine

## 2013-03-12 VITALS — BP 134/84 | Ht 61.0 in | Wt 209.4 lb

## 2013-03-12 DIAGNOSIS — R739 Hyperglycemia, unspecified: Secondary | ICD-10-CM

## 2013-03-12 DIAGNOSIS — E039 Hypothyroidism, unspecified: Secondary | ICD-10-CM

## 2013-03-12 DIAGNOSIS — R7309 Other abnormal glucose: Secondary | ICD-10-CM

## 2013-03-12 MED ORDER — LEVOTHYROXINE SODIUM 125 MCG PO TABS: ORAL_TABLET | ORAL | Status: DC

## 2013-03-12 NOTE — Progress Notes (Signed)
   Subjective:    Patient ID: Kristen Mack, female    DOB: 1945/08/01, 67 y.o.   MRN: [id number]  HPI Patient is here today to go over her blood work.   She states she is very tired. This is nothing new to her.  Patient history hypothyroidism here for followup recently had lab work this was went over with her in detail. Patient also has a history hyperlipidemia and sleep apnea does not use CPAP currently. Is unable to do much exercise because of fibromyalgia. ROS see above   Review of Systems Denies cough wheezing vomiting diarrhea fever chills    Objective:   Physical Exam  Hearts regular lungs clear pulse normal skin warm dry neurologic grossly normal      Assessment & Plan:  #1 hypothyroidism lab work looks good continue current measures updated refills given  #2 hyperlipidemia check lab work in 6 months time  #3 patient does have significant fatigue tiredness she has underlying chronic fatigue problem if this gets worse the next step would be consideration for doing a repeat sleep study.  #4 patient does need bone density she states she will notify us when she is ready to get it done.  Standard office visit 6 months. Medicare wellness in March

## 2013-03-15 ENCOUNTER — Other Ambulatory Visit: Payer: Self-pay | Admitting: *Deleted

## 2013-03-15 MED ORDER — VERAPAMIL HCL ER 180 MG PO CP24: 180.0000 mg | ORAL_CAPSULE | Freq: Every day | ORAL | Status: DC

## 2013-03-15 MED ORDER — LOSARTAN POTASSIUM 100 MG PO TABS: 100.0000 mg | ORAL_TABLET | Freq: Every day | ORAL | Status: DC

## 2013-03-27 ENCOUNTER — Telehealth (INDEPENDENT_AMBULATORY_CARE_PROVIDER_SITE_OTHER): Payer: Self-pay | Admitting: *Deleted

## 2013-03-27 NOTE — Telephone Encounter (Signed)
  Procedure: tcs  Reason/Indication:  Screening, fam hx rectal ca  Has patient had this procedure before?  Yes, 7-8 yrs ago (tenn)  If so, when, by whom and where?    Is there a family history of colon cancer?  Yes, brother (rectal ca)  Who?  What age when diagnosed?    Is patient diabetic?   Pre-diabetic      Does patient have prosthetic heart valve?  no  Do you have a pacemaker?  no  Has patient ever had endocarditis? no  Has patient had joint replacement within last 12 months?  no  Does patient tend to be constipated or take laxatives? no  Is patient on Coumadin, Plavix and/or Aspirin? no  Medications: verapamil 180 mg daily, citalopram 40 mg daily, montelukast 10 mg daily, losartan 100 mg daily, levothyroxine 125 mcg 1 tab sun, mon, wed, fri & sat and 1/2 tab tues & thurs, klor con 20 meq daily, flexeril 10 mg q hs bedtime, atrovent inhaler 2 puffs prn qid, atrovent nebulizer 1 prn qid, ventolin inhaler prn qid, ventolin nebulizer prn qid, flonase 2 puffs per nostril qd  Allergies: sulfur drugs, codeine, demerol, ambien   Medication Adjustment:   Procedure date & time: 04/19/13 at 730

## 2013-03-28 NOTE — Telephone Encounter (Signed)
agree

## 2013-04-05 ENCOUNTER — Encounter (HOSPITAL_COMMUNITY): Payer: Self-pay | Admitting: Pharmacy Technician

## 2013-04-19 ENCOUNTER — Encounter (HOSPITAL_COMMUNITY): Payer: Self-pay | Admitting: *Deleted

## 2013-04-19 ENCOUNTER — Encounter (HOSPITAL_COMMUNITY)
Admission: RE | Disposition: A | Discharge status: Home / Self Care | Payer: Self-pay | Source: Ambulatory Visit | Attending: Internal Medicine

## 2013-04-19 ENCOUNTER — Ambulatory Visit (HOSPITAL_COMMUNITY)
Admission: RE | Admit: 2013-04-19 | Discharge: 2013-04-19 | Disposition: A | Discharge status: Home / Self Care | Payer: Medicare HMO | Source: Ambulatory Visit | Attending: Internal Medicine | Admitting: Internal Medicine

## 2013-04-19 DIAGNOSIS — D126 Benign neoplasm of colon, unspecified: Secondary | ICD-10-CM

## 2013-04-19 DIAGNOSIS — Z800 Family history of malignant neoplasm of digestive organs: Secondary | ICD-10-CM

## 2013-04-19 DIAGNOSIS — Z1211 Encounter for screening for malignant neoplasm of colon: Secondary | ICD-10-CM | POA: Insufficient documentation

## 2013-04-19 DIAGNOSIS — I10 Essential (primary) hypertension: Secondary | ICD-10-CM | POA: Insufficient documentation

## 2013-04-19 DIAGNOSIS — K644 Residual hemorrhoidal skin tags: Secondary | ICD-10-CM | POA: Insufficient documentation

## 2013-04-19 HISTORY — PX: COLONOSCOPY: SHX5424

## 2013-04-19 SURGERY — COLONOSCOPY
Anesthesia: Moderate Sedation

## 2013-04-19 MED ORDER — MEPERIDINE HCL 50 MG/ML IJ SOLN: INTRAMUSCULAR | Status: DC | PRN

## 2013-04-19 MED ORDER — SODIUM CHLORIDE 0.9 % IV SOLN
INTRAVENOUS | Status: DC
  Administered 2013-04-19: 07:00:00 via INTRAVENOUS

## 2013-04-19 MED ORDER — FENTANYL CITRATE 0.05 MG/ML IJ SOLN
INTRAMUSCULAR | Status: DC | PRN
  Administered 2013-04-19 (×2): 25 ug via INTRAVENOUS

## 2013-04-19 MED ORDER — FENTANYL CITRATE 0.05 MG/ML IJ SOLN
INTRAMUSCULAR | Status: AC
  Filled 2013-04-19: qty 2

## 2013-04-19 MED ORDER — MIDAZOLAM HCL 5 MG/5ML IJ SOLN
INTRAMUSCULAR | Status: DC | PRN
  Administered 2013-04-19 (×3): 2 mg via INTRAVENOUS

## 2013-04-19 MED ORDER — STERILE WATER FOR IRRIGATION IR SOLN
Status: DC | PRN
  Administered 2013-04-19: 08:00:00

## 2013-04-19 MED ORDER — MIDAZOLAM HCL 5 MG/5ML IJ SOLN
INTRAMUSCULAR | Status: AC
  Filled 2013-04-19: qty 5

## 2013-04-19 NOTE — H&P (Addendum)
Kristen Mack is an 68 y.o. female.   Chief Complaint: Patient is  here for colonoscopy. HPI: Patient is 68-year-old Caucasian female who is screening colonoscopy. She denies abdominal pain change in bowel habits or rectal bleeding. Her last colonoscopy was possibly 10 years ago. Family history significant rectal carcinoma in her brother. He was in his 40s at the time of diagnosis.  Past Medical History  Diagnosis Date  . HTN (hypertension)   . Hyperglycemia   . GERD (gastroesophageal reflux disease)   . Asthma   . Bronchitis   . Hypothyroid   . CFS (chronic fatigue syndrome)   . Fibromyalgia   . Elevated LFTs 08/2002    Liver Bx?  . Meniere disease   . Sleep apnea   . Stroke     mini  . Allergy   . IBS (irritable bowel syndrome)   . Osteopenia   . Impaired fasting glucose     Past Surgical History  Procedure Laterality Date  . Cesarean section      x 2  . Cholecystectomy    . Esophagogastroduodenoscopy      Dr. Brimmer ([place]) erosive esophagitis?  . Dilation and curettage of uterus    . Colonoscopy  2005    repeat 5 years    Family History  Problem Relation Age of Onset  . Rectal cancer Brother 48  . Ovarian cancer Mother 53  . Hypertension Mother   . Diabetes Mother   . Hypertension Father   . Diabetes Father   . Breast cancer Maternal Aunt   . Breast cancer Paternal Aunt     four Paternal aunts  . Heart disease Paternal Grandfather    Social History:  reports that she quit smoking about 44 years ago. Her smoking use included Cigarettes. She has a 1.5 pack-year smoking history. She does not have any smokeless tobacco history on file. She reports that she does not drink alcohol or use illicit drugs.  Allergies:  Allergies  Allergen Reactions  . Ambien [Zolpidem Tartrate]     Side effects  . Codeine Itching and Nausea And Vomiting  . Demerol [Meperidine]     "messes up senses"  . Sulfa Antibiotics Other (See Comments)    abd cramp    Medications  Prior to Admission  Medication Sig Dispense Refill  . albuterol (PROVENTIL HFA;VENTOLIN HFA) 108 (90 BASE) MCG/ACT inhaler Inhale 2 puffs into the lungs every 6 (six) hours as needed. For shortness of breath      . albuterol (PROVENTIL) (2.5 MG/3ML) 0.083% nebulizer solution Take 2.5 mg by nebulization every 6 (six) hours as needed for wheezing.      . cetirizine (ZYRTEC) 10 MG tablet Take 10 mg by mouth daily.      . Cholecalciferol (VITAMIN D-3 PO) Take by mouth daily.      . Chromium 1000 MCG TABS Take by mouth.      . citalopram (CELEXA) 40 MG tablet Take 1 tablet (40 mg total) by mouth as needed. For mood  90 tablet  2  . co-enzyme Q-10 30 MG capsule Take 200 mg by mouth daily.       . Cyanocobalamin (B-12) 1000 MCG SUBL Place under the tongue daily.      . cyclobenzaprine (FLEXERIL) 10 MG tablet Take 10 mg by mouth daily as needed for muscle spasms.       . DIGESTIVE ENZYMES PO Take by mouth.      . fluticasone (FLONASE) 50 MCG/ACT nasal   spray Place 2 sprays into the nose as needed. For congestion  16 g  1  . ibuprofen (ADVIL,MOTRIN) 200 MG tablet Take 200-400 mg by mouth every 6 (six) hours as needed. For pain      . ipratropium (ATROVENT HFA) 17 MCG/ACT inhaler Inhale 2 puffs into the lungs every 6 (six) hours.  1 Inhaler  5  . ipratropium (ATROVENT) 0.02 % nebulizer solution Take 2.5 mLs (500 mcg total) by nebulization 4 (four) times daily as needed. For shortness of breath  75 mL  5  . levothyroxine (SYNTHROID, LEVOTHROID) 125 MCG tablet 1/2 on Tues and Thur , 1  On all other days  90 tablet  3  . losartan (COZAAR) 100 MG tablet Take 1 tablet (100 mg total) by mouth daily.  90 tablet  1  . Lysine HCl 500 MG TABS Take by mouth. Take as needed      . Magnesium 250 MG TABS Take by mouth 2 (two) times daily.      . meclizine (ANTIVERT) 50 MG tablet Take 50 mg by mouth daily as needed for dizziness. Take as needed      . montelukast (SINGULAIR) 10 MG tablet Take 1 tablet (10 mg total) by  mouth at bedtime.  90 tablet  2  . OVER THE COUNTER MEDICATION Vitamin b 100mg take one every day      . peg 3350 powder (MOVIPREP) 100 G SOLR Take 1 kit (200 g total) by mouth once.  1 kit  0  . potassium chloride SA (K-DUR,KLOR-CON) 20 MEQ tablet Take 20 mEq by mouth daily.      . verapamil (VERELAN PM) 180 MG 24 hr capsule Take 1 capsule (180 mg total) by mouth at bedtime.  90 capsule  1  . VITAMIN A PO Take 1 capsule by mouth daily.      . vitamin C (ASCORBIC ACID) 500 MG tablet Take 500 mg by mouth daily. Take 4 every day      . vitamin E 100 UNIT capsule Take 100 Units by mouth daily.        No results found for this or any previous visit (from the past 48 hour(s)). No results found.  ROS  Blood pressure 177/92, pulse 77, temperature 98.2 F (36.8 C), temperature source Oral, resp. rate 18, height 5' 0.5" (1.537 m), weight 210 lb (95.255 kg), SpO2 94.00%. Physical Exam  Constitutional: She appears well-developed and well-nourished.  HENT:  Mouth/Throat: Oropharynx is clear and moist.  Eyes: Conjunctivae are normal. No scleral icterus.  Neck: No thyromegaly present.  Cardiovascular: Normal rate, regular rhythm and normal heart sounds.   Respiratory: Effort normal and breath sounds normal.  GI: Soft. She exhibits no distension and no mass. There is no tenderness.  Musculoskeletal: She exhibits no edema.  Lymphadenopathy:    She has no cervical adenopathy.  Neurological: She is alert.  Skin: Skin is warm and dry.     Assessment/Plan High risk screening colonoscopy. History of rectal carcinoma in a brother who was in his 40s.  Kristen Mack 04/19/2013, 7:38 AM    

## 2013-04-19 NOTE — Discharge Instructions (Signed)
Resume usual medications and diet. No driving for 24 hours. Physician will contact you with biopsy results  Colonoscopy, Care After Refer to this sheet in the next few weeks. These instructions provide you with information on caring for yourself after your procedure. Your health care provider may also give you more specific instructions. Your treatment has been planned according to current medical practices, but problems sometimes occur. Call your health care provider if you have any problems or questions after your procedure. WHAT TO EXPECT AFTER THE PROCEDURE  After your procedure, it is typical to have the following:  A small amount of blood in your stool.  Moderate amounts of gas and mild abdominal cramping or bloating. HOME CARE INSTRUCTIONS  Do not drive, operate machinery, or sign important documents for 24 hours.  You may shower and resume your regular physical activities, but move at a slower pace for the first 24 hours.  Take frequent rest periods for the first 24 hours.  Walk around or put a warm pack on your abdomen to help reduce abdominal cramping and bloating.  Drink enough fluids to keep your urine clear or pale yellow.  You may resume your normal diet as instructed by your health care provider. Avoid heavy or fried foods that are hard to digest.  Avoid drinking alcohol for 24 hours or as instructed by your health care provider.  Only take over-the-counter or prescription medicines as directed by your health care provider.  If a tissue sample (biopsy) was taken during your procedure:  Do not take aspirin or blood thinners for 7 days, or as instructed by your health care provider.  Do not drink alcohol for 7 days, or as instructed by your health care provider.  Eat soft foods for the first 24 hours. SEEK MEDICAL CARE IF: You have persistent spotting of blood in your stool 2 3 days after the procedure. SEEK IMMEDIATE MEDICAL CARE IF:  You have more than a small  spotting of blood in your stool.  You pass large blood clots in your stool.  Your abdomen is swollen (distended).  You have nausea or vomiting.  You have a fever.  You have increasing abdominal pain that is not relieved with medicine. Document Released: 10/28/2003 Document Revised: 01/03/2013 Document Reviewed: 11/20/2012 [facility] Patient Information 2014 [place]. Colon Polyps Polyps are lumps of extra tissue growing inside the body. Polyps can grow in the large intestine (colon). Most colon polyps are noncancerous (benign). However, some colon polyps can become cancerous over time. Polyps that are larger than a pea may be harmful. To be safe, caregivers remove and test all polyps. CAUSES  Polyps form when mutations in the genes cause your cells to grow and divide even though no more tissue is needed. RISK FACTORS There are a number of risk factors that can increase your chances of getting colon polyps. They include:  Being older than 50 years.  Family history of colon polyps or colon cancer.  Long-term colon diseases, such as colitis or Crohn disease.  Being overweight.  Smoking.  Being inactive.  Drinking too much alcohol. SYMPTOMS  Most small polyps do not cause symptoms. If symptoms are present, they may include:  Blood in the stool. The stool may look dark red or black.  Constipation or diarrhea that lasts longer than 1 week. DIAGNOSIS People often do not know they have polyps until their caregiver finds them during a regular checkup. Your caregiver can use 4 tests to check for polyps:  Digital  rectal exam. The caregiver wears gloves and feels inside the rectum. This test would find polyps only in the rectum.  Barium enema. The caregiver puts a liquid called barium into your rectum before taking X-rays of your colon. Barium makes your colon look white. Polyps are dark, so they are easy to see in the X-ray pictures.  Sigmoidoscopy. A thin, flexible tube  (sigmoidoscope) is placed into your rectum. The sigmoidoscope has a light and tiny camera in it. The caregiver uses the sigmoidoscope to look at the last third of your colon.  Colonoscopy. This test is like sigmoidoscopy, but the caregiver looks at the entire colon. This is the most common method for finding and removing polyps. TREATMENT  Any polyps will be removed during a sigmoidoscopy or colonoscopy. The polyps are then tested for cancer. PREVENTION  To help lower your risk of getting more colon polyps:  Eat plenty of fruits and vegetables. Avoid eating fatty foods.  Do not smoke.  Avoid drinking alcohol.  Exercise every day.  Lose weight if recommended by your caregiver.  Eat plenty of calcium and folate. Foods that are rich in calcium include milk, cheese, and broccoli. Foods that are rich in folate include chickpeas, kidney beans, and spinach. HOME CARE INSTRUCTIONS Keep all follow-up appointments as directed by your caregiver. You may need periodic exams to check for polyps. SEEK MEDICAL CARE IF: You notice bleeding during a bowel movement. Document Released: 12/10/2003 Document Revised: 06/07/2011 Document Reviewed: 05/25/2011 [facility] Patient Information 2014 [place].

## 2013-04-19 NOTE — Op Note (Signed)
COLONOSCOPY PROCEDURE REPORT  PATIENT:  Kristen Mack  MR#:  [id number] Birthdate:  05-15-1945, 68 y.o., female Endoscopist:  Dr. Rogene Houston, MD Referred By:  Dr. Sallee Lange, MD Procedure Date: 04/19/2013  Procedure:   Colonoscopy  Indications: Patient is 68 year old Caucasian female who is undergoing high risk screening colonoscopy. Her brother was diagnosed with rectal carcinoma and is 25s and died of unrelated causes. Informed Consent:  The procedure and risks were reviewed with the patient and informed consent was obtained.  Medications:  Fentanyl 50 mcg IV Versed 6 mg IV  Description of procedure:  After a digital rectal exam was performed, that colonoscope was advanced from the anus through the rectum and colon to the area of the cecum, ileocecal valve and appendiceal orifice. The cecum was deeply intubated. These structures were well-seen and photographed for the record. From the level of the cecum and ileocecal valve, the scope was slowly and cautiously withdrawn. The mucosal surfaces were carefully surveyed utilizing scope tip to flexion to facilitate fold flattening as needed. The scope was pulled down into the rectum where a thorough exam including retroflexion was performed.  Findings:   Prep fair to satisfactory. 2 small polyps ablated via cold biopsy and submitted together. One was located at cecum and the second one was hepatic flexure. Small submucosal lipoma noted next ileocecal valve. Mucosa rest of the colon and rectum was normal. Small hemorrhoids below the dentate line.   Therapeutic/Diagnostic Maneuvers Performed:  See above  Complications:  None  Cecal Withdrawal Time:  11 minutes  Impression:  Examination performed to cecum. 2 small polyps ablated via cold biopsy and submitted together(cecum and hepatic flexure). Small external hemorrhoids.  Recommendations:  Standard instructions given. I will contact patient with biopsy results and further  recommendations.  Nyesha Cliff U  04/19/2013 8:24 AM  CC: Dr. Sallee Lange, MD & Dr. Rayne Du ref. provider found

## 2013-04-24 ENCOUNTER — Encounter (HOSPITAL_COMMUNITY): Payer: Self-pay | Admitting: Internal Medicine

## 2013-04-26 ENCOUNTER — Telehealth: Payer: Self-pay | Admitting: Family Medicine

## 2013-04-26 NOTE — Telephone Encounter (Signed)
Patient needs mail order Rx's for all her meds, please see list on paper chart

## 2013-04-27 ENCOUNTER — Other Ambulatory Visit: Payer: Self-pay | Admitting: *Deleted

## 2013-04-27 MED ORDER — LEVOTHYROXINE SODIUM 125 MCG PO TABS: ORAL_TABLET | ORAL | Status: DC

## 2013-04-27 MED ORDER — MONTELUKAST SODIUM 10 MG PO TABS: 10.0000 mg | ORAL_TABLET | Freq: Every day | ORAL | Status: DC

## 2013-04-27 MED ORDER — VERAPAMIL HCL ER 180 MG PO CP24: 180.0000 mg | ORAL_CAPSULE | Freq: Every day | ORAL | Status: DC

## 2013-04-27 MED ORDER — CITALOPRAM HYDROBROMIDE 40 MG PO TABS
40.0000 mg | ORAL_TABLET | ORAL | Status: DC | PRN
Start: 2013-04-27 — End: 2013-06-07

## 2013-04-27 MED ORDER — ALBUTEROL SULFATE HFA 108 (90 BASE) MCG/ACT IN AERS: 2.0000 | INHALATION_SPRAY | Freq: Four times a day (QID) | RESPIRATORY_TRACT | Status: DC | PRN

## 2013-04-27 MED ORDER — FLUTICASONE PROPIONATE 50 MCG/ACT NA SUSP: 2.0000 | Freq: Every day | NASAL | Status: DC

## 2013-04-27 MED ORDER — LOSARTAN POTASSIUM 100 MG PO TABS
100.0000 mg | ORAL_TABLET | Freq: Every day | ORAL | Status: DC
Start: 2013-04-27 — End: 2013-09-19

## 2013-04-27 MED ORDER — POTASSIUM CHLORIDE CRYS ER 20 MEQ PO TBCR: 20.0000 meq | EXTENDED_RELEASE_TABLET | Freq: Every day | ORAL | Status: DC

## 2013-04-27 MED ORDER — IPRATROPIUM BROMIDE HFA 17 MCG/ACT IN AERS: 2.0000 | INHALATION_SPRAY | Freq: Four times a day (QID) | RESPIRATORY_TRACT | Status: DC | PRN

## 2013-04-27 NOTE — Telephone Encounter (Signed)
OK, with her health history best to be seen q21month

## 2013-04-27 NOTE — Telephone Encounter (Signed)
rx ready for pick up pt notified.

## 2013-06-01 ENCOUNTER — Telehealth: Payer: Self-pay | Admitting: Family Medicine

## 2013-06-01 MED ORDER — IPRATROPIUM BROMIDE 0.02 % IN SOLN: 500.0000 ug | Freq: Four times a day (QID) | RESPIRATORY_TRACT | Status: DC | PRN

## 2013-06-01 NOTE — Telephone Encounter (Signed)
Patient notified

## 2013-06-01 NOTE — Telephone Encounter (Signed)
Pt needs refill on IPRATROPIUM (Atrovent) neb solution 0.02%, please send to [facility], pt only wants a month's supply, please note that this needs to be billed through pt's part B Medicare coverage (they'll know what it means), please call pt when done

## 2013-06-07 ENCOUNTER — Other Ambulatory Visit: Payer: Self-pay

## 2013-06-07 MED ORDER — CITALOPRAM HYDROBROMIDE 40 MG PO TABS: 40.0000 mg | ORAL_TABLET | Freq: Every day | ORAL | Status: DC

## 2013-07-09 ENCOUNTER — Other Ambulatory Visit: Payer: Self-pay | Admitting: Family Medicine

## 2013-07-09 DIAGNOSIS — R928 Other abnormal and inconclusive findings on diagnostic imaging of breast: Secondary | ICD-10-CM

## 2013-07-17 ENCOUNTER — Telehealth: Payer: Self-pay | Admitting: Family Medicine

## 2013-07-17 NOTE — Telephone Encounter (Signed)
She wou;ld be able to do 1 test qd, 90 dayt with prn refills, Dx DM type 2

## 2013-07-17 NOTE — Telephone Encounter (Signed)
Patient noted that script will be faxed today. Patient verbalized understanding.

## 2013-07-17 NOTE — Telephone Encounter (Signed)
See attached to the chart a letter that was dropped off by the pts family Member   Please advise to what you want to do with this prescription request.

## 2013-08-09 ENCOUNTER — Telehealth: Payer: Self-pay | Admitting: Family Medicine

## 2013-08-09 DIAGNOSIS — E119 Type 2 diabetes mellitus without complications: Secondary | ICD-10-CM

## 2013-08-09 DIAGNOSIS — Z79899 Other long term (current) drug therapy: Secondary | ICD-10-CM

## 2013-08-09 DIAGNOSIS — E039 Hypothyroidism, unspecified: Secondary | ICD-10-CM

## 2013-08-09 DIAGNOSIS — E782 Mixed hyperlipidemia: Secondary | ICD-10-CM

## 2013-08-09 NOTE — Telephone Encounter (Signed)
Patient had Lipid, Liver, Met 7 in July 14  -  HgbA1c, TSH, T4 in 12/14

## 2013-08-09 NOTE — Telephone Encounter (Signed)
Lipid, liver, metabolic 7, TSH, urine micro-protein, hemoglobin A1c

## 2013-08-09 NOTE — Telephone Encounter (Signed)
Blood work orders placed in [employer]. Patient notified.

## 2013-08-09 NOTE — Telephone Encounter (Signed)
Patient needs order for blood work. She is coming next week due to elevated blood sugar levels and would like to go ahead and have the labs done.

## 2013-08-10 LAB — HEPATIC FUNCTION PANEL
ALBUMIN: 3.8 g/dL (ref 3.5–5.2)
ALT: 83 U/L — AB (ref 0–35)
AST: 83 U/L — ABNORMAL HIGH (ref 0–37)
Alkaline Phosphatase: 76 U/L (ref 39–117)
BILIRUBIN INDIRECT: 0.4 mg/dL (ref 0.2–1.2)
Bilirubin, Direct: 0.1 mg/dL (ref 0.0–0.3)
TOTAL PROTEIN: 7 g/dL (ref 6.0–8.3)
Total Bilirubin: 0.5 mg/dL (ref 0.2–1.2)

## 2013-08-10 LAB — HEMOGLOBIN A1C
HEMOGLOBIN A1C: 6.2 % — AB (ref <5.7)
MEAN PLASMA GLUCOSE: 131 mg/dL — AB (ref <117)

## 2013-08-10 LAB — LIPID PANEL
Cholesterol: 176 mg/dL (ref 0–200)
HDL: 40 mg/dL (ref >39)
LDL Cholesterol: 109 mg/dL — ABNORMAL HIGH (ref 0–99)
TRIGLYCERIDES: 137 mg/dL (ref <150)
Total CHOL/HDL Ratio: 4.4 Ratio
VLDL: 27 mg/dL (ref 0–40)

## 2013-08-10 LAB — BASIC METABOLIC PANEL
BUN: 8 mg/dL (ref 6–23)
CHLORIDE: 105 meq/L (ref 96–112)
CO2: 25 mEq/L (ref 19–32)
Calcium: 9.4 mg/dL (ref 8.4–10.5)
Creat: 0.64 mg/dL (ref 0.50–1.10)
Glucose, Bld: 124 mg/dL — ABNORMAL HIGH (ref 70–99)
Potassium: 4 mEq/L (ref 3.5–5.3)
SODIUM: 140 meq/L (ref 135–145)

## 2013-08-10 LAB — TSH: TSH: 2.228 u[IU]/mL (ref 0.350–4.500)

## 2013-08-11 LAB — MICROALBUMIN, URINE: MICROALB UR: 1.74 mg/dL (ref 0.00–1.89)

## 2013-08-13 NOTE — Telephone Encounter (Signed)
Pt notified and verbalized understanding of results. Will discuss further in OV.

## 2013-08-14 ENCOUNTER — Ambulatory Visit (INDEPENDENT_AMBULATORY_CARE_PROVIDER_SITE_OTHER): Payer: Medicare HMO | Admitting: Family Medicine

## 2013-08-14 ENCOUNTER — Encounter: Payer: Self-pay | Admitting: Family Medicine

## 2013-08-14 VITALS — BP 134/88 | Temp 98.3°F | Ht 60.5 in | Wt 213.0 lb

## 2013-08-14 DIAGNOSIS — F329 Major depressive disorder, single episode, unspecified: Secondary | ICD-10-CM

## 2013-08-14 DIAGNOSIS — K760 Fatty (change of) liver, not elsewhere classified: Secondary | ICD-10-CM

## 2013-08-14 DIAGNOSIS — R7303 Prediabetes: Secondary | ICD-10-CM | POA: Insufficient documentation

## 2013-08-14 DIAGNOSIS — R7309 Other abnormal glucose: Secondary | ICD-10-CM

## 2013-08-14 DIAGNOSIS — K7689 Other specified diseases of liver: Secondary | ICD-10-CM

## 2013-08-14 DIAGNOSIS — E039 Hypothyroidism, unspecified: Secondary | ICD-10-CM

## 2013-08-14 DIAGNOSIS — F3289 Other specified depressive episodes: Secondary | ICD-10-CM

## 2013-08-14 DIAGNOSIS — F32A Depression, unspecified: Secondary | ICD-10-CM

## 2013-08-14 MED ORDER — VENLAFAXINE HCL ER 37.5 MG PO CP24: 37.5000 mg | ORAL_CAPSULE | Freq: Every day | ORAL | Status: DC

## 2013-08-14 NOTE — Progress Notes (Signed)
   Subjective:    Patient ID: Kristen Mack, female    DOB: 1945/11/08, 68 y.o.   MRN: [id number]  HPIHyperglycemia. A1C on bloodwork on 5/14 was 6.2. Patient states her blood sugar has been in the 120's -140's fasting.   Patient states she has not been feeling well. Having dizziness, has a viral infection for the past 3 months that comes and goes. Trouble with asthma. Wheezing. Her overall use of inhalers his. In frequent using albuterol just occasionally. She states it only happened a couple days out of every few months. Nothing serious lately. No fevers.  Swelling in feet for the past 2 weeks.   Patient states she stopped celexa about 2 months ago because it made her feel worse. She would like to try Effexor or Wellbutrin she is uncertain of which currently  Review of Systems  Constitutional: Negative for activity change, appetite change and fatigue.  Respiratory: Positive for wheezing. Negative for cough and shortness of breath.   Cardiovascular: Negative for chest pain.  Gastrointestinal: Negative for abdominal pain and abdominal distention.  Endocrine: Negative for polydipsia and polyphagia.  Genitourinary: Negative for frequency.  Neurological: Negative for weakness.  Psychiatric/Behavioral: Negative for confusion.       Objective:   Physical Exam  Vitals reviewed. Constitutional: She appears well-nourished. No distress.  Cardiovascular: Normal rate, regular rhythm and normal heart sounds.   No murmur heard. Pulmonary/Chest: Effort normal and breath sounds normal. No respiratory distress.  Musculoskeletal: She exhibits no edema.  Lymphadenopathy:    She has no cervical adenopathy.  Neurological: She is alert. She exhibits normal muscle tone.  Psychiatric: Her behavior is normal.   Abdomen soft no masses  All of her lab work was reviewed in detail.     Assessment & Plan:  1. Hypothyroid Thyroid under good control continue current measures.  2.  Prediabetes Patient is going to try better on diet and exercise and she will recheck this again in 3-4 months time at that point in time may have to be on metformin  3. Depression She will consider between Wellbutrin and Effexor. She is uncertain which one she wants to do currently  4. Fatty liver She understands the importance of losing weight watching diet. She will need a ultrasound with elastography but she does not want to do this to August  Lipids borderline Patient was advised to get tetanus shot pneumonia vaccine she defers currently

## 2013-08-14 NOTE — Patient Instructions (Signed)
DASH Diet  The DASH diet stands for "Dietary Approaches to Stop Hypertension." It is a healthy eating plan that has been shown to reduce high blood pressure (hypertension) in as little as 14 days, while also possibly providing other significant health benefits. These other health benefits include reducing the risk of breast cancer after menopause and reducing the risk of type 2 diabetes, heart disease, colon cancer, and stroke. Health benefits also include weight loss and slowing kidney failure in patients with chronic kidney disease.   DIET GUIDELINES  · Limit salt (sodium). Your diet should contain less than 1500 mg of sodium daily.  · Limit refined or processed carbohydrates. Your diet should include mostly whole grains. Desserts and added sugars should be used sparingly.  · Include small amounts of heart-healthy fats. These types of fats include nuts, oils, and tub margarine. Limit saturated and trans fats. These fats have been shown to be harmful in the body.  CHOOSING FOODS   The following food groups are based on a 2000 calorie diet. See your Registered Dietitian for individual calorie needs.  Grains and Grain Products (6 to 8 servings daily)  · Eat More Often: Whole-wheat bread, brown rice, whole-grain or wheat pasta, quinoa, popcorn without added fat or salt (air popped).  · Eat Less Often: White bread, white pasta, white rice, cornbread.  Vegetables (4 to 5 servings daily)  · Eat More Often: Fresh, frozen, and canned vegetables. Vegetables may be raw, steamed, roasted, or grilled with a minimal amount of fat.  · Eat Less Often/Avoid: Creamed or fried vegetables. Vegetables in a cheese sauce.  Fruit (4 to 5 servings daily)  · Eat More Often: All fresh, canned (in natural juice), or frozen fruits. Dried fruits without added sugar. One hundred percent fruit juice (½ cup [237 mL] daily).  · Eat Less Often: Dried fruits with added sugar. Canned fruit in light or heavy syrup.  Lean Meats, Fish, and Poultry (2  servings or less daily. One serving is 3 to 4 oz [85-114 g]).  · Eat More Often: Ninety percent or leaner ground beef, tenderloin, sirloin. Round cuts of beef, chicken breast, turkey breast. All fish. Grill, bake, or broil your meat. Nothing should be fried.  · Eat Less Often/Avoid: Fatty cuts of meat, turkey, or chicken leg, thigh, or wing. Fried cuts of meat or fish.  Dairy (2 to 3 servings)  · Eat More Often: Low-fat or fat-free milk, low-fat plain or light yogurt, reduced-fat or part-skim cheese.  · Eat Less Often/Avoid: Milk (whole, 2%). Whole milk yogurt. Full-fat cheeses.  Nuts, Seeds, and Legumes (4 to 5 servings per week)  · Eat More Often: All without added salt.  · Eat Less Often/Avoid: Salted nuts and seeds, canned beans with added salt.  Fats and Sweets (limited)  · Eat More Often: Vegetable oils, tub margarines without trans fats, sugar-free gelatin. Mayonnaise and salad dressings.  · Eat Less Often/Avoid: Coconut oils, palm oils, butter, stick margarine, cream, half and half, cookies, candy, pie.  FOR MORE INFORMATION  The Dash Diet Eating Plan: www.dashdiet.org  Document Released: 03/04/2011 Document Revised: 06/07/2011 Document Reviewed: 03/04/2011  ExitCare® Patient Information ©2014 [employer].

## 2013-08-15 ENCOUNTER — Ambulatory Visit (HOSPITAL_COMMUNITY)
Admission: RE | Admit: 2013-08-15 | Discharge: 2013-08-15 | Disposition: A | Discharge status: Home / Self Care | Payer: Medicare HMO | Source: Ambulatory Visit | Attending: Family Medicine | Admitting: Family Medicine

## 2013-08-15 DIAGNOSIS — R928 Other abnormal and inconclusive findings on diagnostic imaging of breast: Secondary | ICD-10-CM

## 2013-08-29 ENCOUNTER — Encounter: Payer: Self-pay | Admitting: Family Medicine

## 2013-08-30 MED ORDER — VENLAFAXINE HCL ER 75 MG PO CP24: 75.0000 mg | ORAL_CAPSULE | Freq: Every day | ORAL | Status: DC

## 2013-08-31 ENCOUNTER — Other Ambulatory Visit: Payer: Self-pay | Admitting: Family Medicine

## 2013-09-03 ENCOUNTER — Encounter: Payer: Self-pay | Admitting: Family Medicine

## 2013-09-03 ENCOUNTER — Ambulatory Visit (INDEPENDENT_AMBULATORY_CARE_PROVIDER_SITE_OTHER): Payer: Medicare HMO | Admitting: Family Medicine

## 2013-09-03 VITALS — BP 142/86 | Ht 60.5 in | Wt 213.0 lb

## 2013-09-03 DIAGNOSIS — I10 Essential (primary) hypertension: Secondary | ICD-10-CM

## 2013-09-03 MED ORDER — VERAPAMIL HCL ER 240 MG PO CP24: 240.0000 mg | ORAL_CAPSULE | Freq: Every day | ORAL | Status: DC

## 2013-09-03 NOTE — Progress Notes (Signed)
   Subjective:    Patient ID: Kristen Mack, female    DOB: 1945/11/13, 68 y.o.   MRN: [id number]  Hypertension This is a new problem. The current episode started today. The problem has been gradually worsening since onset. The problem is uncontrolled. Associated symptoms include headaches. Pertinent negatives include no chest pain or shortness of breath. There are no associated agents to hypertension. There are no known risk factors for coronary artery disease. Treatments tried: losartan. The current treatment provides no improvement. There are no compliance problems.    Patient has no other concerns at this time.  Patient states that her insurance company sent her blood pressure cuff read very high scared her so therefore she came to be seen  Review of Systems  Constitutional: Negative for activity change, appetite change and fatigue.  Respiratory: Negative for cough and shortness of breath.   Cardiovascular: Negative for chest pain and leg swelling.  Endocrine: Negative for polydipsia and polyphagia.  Genitourinary: Negative for frequency.  Neurological: Positive for headaches. Negative for weakness.  Psychiatric/Behavioral: Negative for confusion.       Objective:   Physical Exam  Vitals reviewed. Constitutional: She appears well-nourished. No distress.  Cardiovascular: Normal rate, regular rhythm and normal heart sounds.   No murmur heard. Pulmonary/Chest: Effort normal and breath sounds normal. No respiratory distress.  Musculoskeletal: She exhibits no edema.  Lymphadenopathy:    She has no cervical adenopathy.  Neurological: She is alert. She exhibits normal muscle tone.  Psychiatric: Her behavior is normal.     Blood pressure was checked with her unit and then with our unit her unit stated 162/120 heart unit 148/92     Assessment & Plan:   HTN uncontrolled increase  verapamil 240 mg daily patient has a wellness visit following up in several weeks we will recheck  blood pressure at that time she is to maintain a heart healthy diet if she has further troubles followup sooner.

## 2013-09-03 NOTE — Patient Instructions (Signed)
DASH Diet  The DASH diet stands for "Dietary Approaches to Stop Hypertension." It is a healthy eating plan that has been shown to reduce high blood pressure (hypertension) in as little as 14 days, while also possibly providing other significant health benefits. These other health benefits include reducing the risk of breast cancer after menopause and reducing the risk of type 2 diabetes, heart disease, colon cancer, and stroke. Health benefits also include weight loss and slowing kidney failure in patients with chronic kidney disease.   DIET GUIDELINES  · Limit salt (sodium). Your diet should contain less than 1500 mg of sodium daily.  · Limit refined or processed carbohydrates. Your diet should include mostly whole grains. Desserts and added sugars should be used sparingly.  · Include small amounts of heart-healthy fats. These types of fats include nuts, oils, and tub margarine. Limit saturated and trans fats. These fats have been shown to be harmful in the body.  CHOOSING FOODS   The following food groups are based on a 2000 calorie diet. See your Registered Dietitian for individual calorie needs.  Grains and Grain Products (6 to 8 servings daily)  · Eat More Often: Whole-wheat bread, brown rice, whole-grain or wheat pasta, quinoa, popcorn without added fat or salt (air popped).  · Eat Less Often: White bread, white pasta, white rice, cornbread.  Vegetables (4 to 5 servings daily)  · Eat More Often: Fresh, frozen, and canned vegetables. Vegetables may be raw, steamed, roasted, or grilled with a minimal amount of fat.  · Eat Less Often/Avoid: Creamed or fried vegetables. Vegetables in a cheese sauce.  Fruit (4 to 5 servings daily)  · Eat More Often: All fresh, canned (in natural juice), or frozen fruits. Dried fruits without added sugar. One hundred percent fruit juice (½ cup [237 mL] daily).  · Eat Less Often: Dried fruits with added sugar. Canned fruit in light or heavy syrup.  Lean Meats, Fish, and Poultry (2  servings or less daily. One serving is 3 to 4 oz [85-114 g]).  · Eat More Often: Ninety percent or leaner ground beef, tenderloin, sirloin. Round cuts of beef, chicken breast, turkey breast. All fish. Grill, bake, or broil your meat. Nothing should be fried.  · Eat Less Often/Avoid: Fatty cuts of meat, turkey, or chicken leg, thigh, or wing. Fried cuts of meat or fish.  Dairy (2 to 3 servings)  · Eat More Often: Low-fat or fat-free milk, low-fat plain or light yogurt, reduced-fat or part-skim cheese.  · Eat Less Often/Avoid: Milk (whole, 2%). Whole milk yogurt. Full-fat cheeses.  Nuts, Seeds, and Legumes (4 to 5 servings per week)  · Eat More Often: All without added salt.  · Eat Less Often/Avoid: Salted nuts and seeds, canned beans with added salt.  Fats and Sweets (limited)  · Eat More Often: Vegetable oils, tub margarines without trans fats, sugar-free gelatin. Mayonnaise and salad dressings.  · Eat Less Often/Avoid: Coconut oils, palm oils, butter, stick margarine, cream, half and half, cookies, candy, pie.  FOR MORE INFORMATION  The Dash Diet Eating Plan: www.dashdiet.org  Document Released: 03/04/2011 Document Revised: 06/07/2011 Document Reviewed: 03/04/2011  ExitCare® Patient Information ©2014 [employer].

## 2013-09-19 ENCOUNTER — Encounter: Payer: Self-pay | Admitting: Family Medicine

## 2013-09-19 ENCOUNTER — Ambulatory Visit (INDEPENDENT_AMBULATORY_CARE_PROVIDER_SITE_OTHER): Payer: Medicare HMO | Admitting: Family Medicine

## 2013-09-19 VITALS — BP 134/80 | Ht <= 58 in | Wt 210.0 lb

## 2013-09-19 DIAGNOSIS — R5381 Other malaise: Secondary | ICD-10-CM

## 2013-09-19 DIAGNOSIS — R7402 Elevation of levels of lactic acid dehydrogenase (LDH): Secondary | ICD-10-CM

## 2013-09-19 DIAGNOSIS — R5383 Other fatigue: Secondary | ICD-10-CM

## 2013-09-19 DIAGNOSIS — R7401 Elevation of levels of liver transaminase levels: Secondary | ICD-10-CM

## 2013-09-19 DIAGNOSIS — R7303 Prediabetes: Secondary | ICD-10-CM

## 2013-09-19 DIAGNOSIS — R7309 Other abnormal glucose: Secondary | ICD-10-CM

## 2013-09-19 DIAGNOSIS — K760 Fatty (change of) liver, not elsewhere classified: Secondary | ICD-10-CM

## 2013-09-19 DIAGNOSIS — K7689 Other specified diseases of liver: Secondary | ICD-10-CM

## 2013-09-19 DIAGNOSIS — I951 Orthostatic hypotension: Secondary | ICD-10-CM

## 2013-09-19 DIAGNOSIS — R740 Nonspecific elevation of levels of transaminase and lactic acid dehydrogenase [LDH]: Secondary | ICD-10-CM

## 2013-09-19 LAB — CBC WITH DIFFERENTIAL/PLATELET
BASOS PCT: 1 % (ref 0–1)
Basophils Absolute: 0.1 10*3/uL (ref 0.0–0.1)
EOS PCT: 8 % — AB (ref 0–5)
Eosinophils Absolute: 0.7 10*3/uL (ref 0.0–0.7)
HEMATOCRIT: 42.3 % (ref 36.0–46.0)
Hemoglobin: 14.2 g/dL (ref 12.0–15.0)
Lymphocytes Relative: 21 % (ref 12–46)
Lymphs Abs: 1.9 10*3/uL (ref 0.7–4.0)
MCH: 28.9 pg (ref 26.0–34.0)
MCHC: 33.6 g/dL (ref 30.0–36.0)
MCV: 86.2 fL (ref 78.0–100.0)
MONO ABS: 0.9 10*3/uL (ref 0.1–1.0)
Monocytes Relative: 10 % (ref 3–12)
Neutro Abs: 5.5 10*3/uL (ref 1.7–7.7)
Neutrophils Relative %: 60 % (ref 43–77)
PLATELETS: 431 10*3/uL — AB (ref 150–400)
RBC: 4.91 MIL/uL (ref 3.87–5.11)
RDW: 13.7 % (ref 11.5–15.5)
WBC: 9.2 10*3/uL (ref 4.0–10.5)

## 2013-09-19 MED ORDER — LEVOTHYROXINE SODIUM 125 MCG PO TABS: ORAL_TABLET | ORAL | Status: DC

## 2013-09-19 MED ORDER — LOSARTAN POTASSIUM 50 MG PO TABS: 50.0000 mg | ORAL_TABLET | Freq: Every day | ORAL | Status: DC

## 2013-09-19 MED ORDER — MONTELUKAST SODIUM 10 MG PO TABS: 10.0000 mg | ORAL_TABLET | Freq: Every day | ORAL | Status: DC

## 2013-09-19 NOTE — Progress Notes (Signed)
   Subjective:    Patient ID: Kristen Mack, female    DOB: 07-23-1945, 68 y.o.   MRN: [id number]  Hypertension This is a chronic problem.   Walks when she feels good. About every 3 days. Follows a healthy diet. Eats a lot of green vegetables. Has cut back on soda.   Bloodwork done on Aug 14, 2013. Had A1C on bloodwork 6.2. Blood sugar running around 130.  Long discussion held with patient regarding prediabetes. Also discussion held regarding fatty liver and cirrhosis. Greater than half the amount of time spent in discussion of all these issues 2530 minutes spent with patient.  Review of Systems     Objective:   Physical Exam  Lungs clear hearts regular abdomen soft obese no masses felt in the abdomen extremities no edema skin warm dry Patient is orthostatic with laying sitting standing significant blood drop.      Assessment & Plan:  Orthostatic hypotension reduce her medication down to 50 mg of the losartan.  Fatty liver elevated liver enzymes this is worrisome for the possibility of some underlying cirrhosis. We will do ultrasound along with a last testing in addition to this patient may need gastroenterology referral depending on the results of the tests. Lab work ordered as well to rule out less obvious causes HTN fair control watch diet closely she is to followup again in 4-6 weeks' time to recheck blood pressure

## 2013-09-20 LAB — SEDIMENTATION RATE: Sed Rate: 32 mm/hr — ABNORMAL HIGH (ref 0–22)

## 2013-09-20 LAB — HEPATITIS C ANTIBODY: HCV AB: NEGATIVE

## 2013-09-20 LAB — ANA: ANA: NEGATIVE

## 2013-09-20 LAB — HEPATITIS B SURFACE ANTIGEN: HEP B S AG: NEGATIVE

## 2013-09-21 ENCOUNTER — Ambulatory Visit (HOSPITAL_COMMUNITY)
Admission: RE | Admit: 2013-09-21 | Discharge: 2013-09-21 | Disposition: A | Discharge status: Home / Self Care | Payer: Medicare HMO | Source: Ambulatory Visit | Attending: Family Medicine | Admitting: Family Medicine

## 2013-09-21 DIAGNOSIS — K7689 Other specified diseases of liver: Secondary | ICD-10-CM | POA: Insufficient documentation

## 2013-09-21 LAB — CERULOPLASMIN: Ceruloplasmin: 28 mg/dL (ref 18–53)

## 2013-09-24 ENCOUNTER — Telehealth: Payer: Self-pay | Admitting: Family Medicine

## 2013-09-24 ENCOUNTER — Other Ambulatory Visit: Payer: Self-pay | Admitting: *Deleted

## 2013-09-24 DIAGNOSIS — Z79899 Other long term (current) drug therapy: Secondary | ICD-10-CM

## 2013-09-24 DIAGNOSIS — K760 Fatty (change of) liver, not elsewhere classified: Secondary | ICD-10-CM

## 2013-09-24 MED ORDER — LEVOTHYROXINE SODIUM 125 MCG PO TABS: ORAL_TABLET | ORAL | Status: DC

## 2013-09-24 MED ORDER — MONTELUKAST SODIUM 10 MG PO TABS: 10.0000 mg | ORAL_TABLET | Freq: Every day | ORAL | Status: DC

## 2013-09-24 NOTE — Telephone Encounter (Signed)
Patient said that she realized after going through her prescriptions that she told us the wrong pharmacy to send in. She needs Rx for levothyroxine (SYNTHROID, LEVOTHROID) 125 MCG tablet and montelukast (SINGULAIR) 10 MG tablet sent to [facility].

## 2013-09-24 NOTE — Telephone Encounter (Signed)
Medications sent to [facility] order. Patient was notified.

## 2013-12-13 ENCOUNTER — Ambulatory Visit: Payer: Medicare HMO

## 2013-12-13 VITALS — BP 130/92

## 2013-12-13 DIAGNOSIS — Z0130 Encounter for examination of blood pressure without abnormal findings: Secondary | ICD-10-CM

## 2014-01-01 LAB — HEPATIC FUNCTION PANEL
ALT: 88 U/L — AB (ref 0–35)
AST: 87 U/L — ABNORMAL HIGH (ref 0–37)
Albumin: 3.9 g/dL (ref 3.5–5.2)
Alkaline Phosphatase: 71 U/L (ref 39–117)
Bilirubin, Direct: 0.1 mg/dL (ref 0.0–0.3)
Indirect Bilirubin: 0.6 mg/dL (ref 0.2–1.2)
TOTAL PROTEIN: 7.1 g/dL (ref 6.0–8.3)
Total Bilirubin: 0.7 mg/dL (ref 0.2–1.2)

## 2014-01-03 ENCOUNTER — Ambulatory Visit (INDEPENDENT_AMBULATORY_CARE_PROVIDER_SITE_OTHER): Payer: Medicare HMO | Admitting: Family Medicine

## 2014-01-03 VITALS — BP 148/100

## 2014-01-03 DIAGNOSIS — R748 Abnormal levels of other serum enzymes: Secondary | ICD-10-CM

## 2014-01-03 DIAGNOSIS — I10 Essential (primary) hypertension: Secondary | ICD-10-CM

## 2014-01-03 MED ORDER — LOSARTAN POTASSIUM 100 MG PO TABS: 50.0000 mg | ORAL_TABLET | Freq: Every day | ORAL | Status: DC

## 2014-01-03 MED ORDER — LOSARTAN POTASSIUM 100 MG PO TABS: 100.0000 mg | ORAL_TABLET | Freq: Every day | ORAL | Status: DC

## 2014-01-03 NOTE — Progress Notes (Signed)
   Subjective:    Patient ID: Kristen Mack, female    DOB: [birth year], 68 y.o.   MRN: [id number]  HPIHypertension. Taking verapamil 240 mg and losartan 37m at night. Pt states her BP has been running in the 130's over 90's at home.     Review of Systems     Objective:   Physical Exam  Blood pressure rechecked twice by the nurse twice by myself 130/92      Assessment & Plan:  15 minutes spent with patient discussing her blood pressure and discussing elevated liver enzymes. #1 hypertension subpar control increase losartan. 100 mg daily. New prescription sent in #2 fatty liver elevated liver enzymes this patient needs further evaluation. I am concerned about her risk of developing cirrhosis. I encouraged her to exercise minimize starches and sugars in the diet watch her portions and try to bring her weight down. We will refer her to gastroenterology.  Followup 4-6 weeks to recheck blood pressure

## 2014-01-04 ENCOUNTER — Other Ambulatory Visit: Payer: Self-pay | Admitting: *Deleted

## 2014-01-04 MED ORDER — LOSARTAN POTASSIUM 100 MG PO TABS: 100.0000 mg | ORAL_TABLET | Freq: Every day | ORAL | Status: DC

## 2014-01-11 ENCOUNTER — Telehealth: Payer: Self-pay | Admitting: Internal Medicine

## 2014-01-11 ENCOUNTER — Other Ambulatory Visit: Payer: Self-pay | Admitting: Family Medicine

## 2014-01-11 DIAGNOSIS — Z09 Encounter for follow-up examination after completed treatment for conditions other than malignant neoplasm: Secondary | ICD-10-CM

## 2014-01-11 DIAGNOSIS — R921 Mammographic calcification found on diagnostic imaging of breast: Secondary | ICD-10-CM

## 2014-01-11 DIAGNOSIS — Z1231 Encounter for screening mammogram for malignant neoplasm of breast: Secondary | ICD-10-CM

## 2014-01-14 NOTE — Telephone Encounter (Signed)
Left message for pt to call back.  Offered pt an appt with [facility] but pt states she would like to wait to see the MD. Pt scheduled to see Dr. Henrene Pastor 03/19/14@3pm . Pt aware of appt.

## 2014-01-14 NOTE — Telephone Encounter (Signed)
She can see an extender when I'm supervising.

## 2014-01-14 NOTE — Telephone Encounter (Signed)
Op Note by Rogene Houston, MD at 04/19/2013 8:24 AM    Author: Rogene Houston, MD Service: Endoscopy Author Type: Physician   Filed: 04/19/2013 8:28 AM Note Time: 04/19/2013 8:24 AM Status: Signed   Editor: Rogene Houston, MD (Physician)      COLONOSCOPY PROCEDURE REPORT  PATIENT: Kristen Mack MR#: [id number]  Birthdate: [birth year], 68 y.o., female  Endoscopist: Dr. Rogene Houston, MD  Referred By: Dr. Sallee Lange, MD  Procedure Date: 04/19/2013  Procedure: Colonoscopy  Indications: Patient is 68 year old Caucasian female who is undergoing high risk screening colonoscopy. Her brother was diagnosed with rectal carcinoma and is 70s and died of unrelated causes.  Informed Consent: The procedure and risks were reviewed with the patient and informed consent was obtained.  Medications:  Fentanyl 50 mcg IV  Versed 6 mg IV  Description of procedure: After a digital rectal exam was performed, that colonoscope was advanced from the anus through the rectum and colon to the area of the cecum, ileocecal valve and appendiceal orifice. The cecum was deeply intubated. These structures were well-seen and photographed for the record. From the level of the cecum and ileocecal valve, the scope was slowly and cautiously withdrawn. The mucosal surfaces were carefully surveyed utilizing scope tip to flexion to facilitate fold flattening as needed. The scope was pulled down into the rectum where a thorough exam including retroflexion was performed.  Findings:  Prep fair to satisfactory.  2 small polyps ablated via cold biopsy and submitted together. One was located at cecum and the second one was hepatic flexure.  Small submucosal lipoma noted next ileocecal valve.  Mucosa rest of the colon and rectum was normal.  Small hemorrhoids below the dentate line.  Therapeutic/Diagnostic Maneuvers Performed: See above  Complications: None  Cecal Withdrawal Time: 11 minutes  Impression:  Examination performed to  cecum.  2 small polyps ablated via cold biopsy and submitted together(cecum and hepatic flexure).  Small external hemorrhoids.  Recommendations:  Standard instructions given.  I will contact patient with biopsy results and further recommendations.  KristenNAJEEB Mack 04/19/2013 8:24 AM  CC: Dr. Sallee Lange, MD & Dr. Rayne Mack ref. provider found      Notes Recorded by Rogene Houston, MD on 04/23/2013 at 5:08 PM Biopsy results reviewed with patient. Patient had 2 small polyps removed and they are tubular adenomas. Family history significant for rectal carcinoma in her brother. Next colonoscopy in 5 years Report to PCP  Please see colon and path report.

## 2014-01-14 NOTE — Telephone Encounter (Signed)
Pt requests to change care from Dr. Laural Golden to Dr. Henrene Pastor. Pt has only had colon with Dr. Laural Golden. Records are in epic. Will you accept the pt? Please advise.

## 2014-01-14 NOTE — Telephone Encounter (Signed)
I didn't see a colon with Dr Laural Golden? I did see procedures from somewhere else 2004

## 2014-01-18 ENCOUNTER — Telehealth: Payer: Self-pay | Admitting: Family Medicine

## 2014-01-18 DIAGNOSIS — M8580 Other specified disorders of bone density and structure, unspecified site: Secondary | ICD-10-CM

## 2014-01-18 NOTE — Telephone Encounter (Signed)
Patient needs order for bone density test to [facility].

## 2014-01-20 NOTE — Telephone Encounter (Signed)
Please go forward with setting this up. Nurses, please help patient set this up.

## 2014-01-21 NOTE — Telephone Encounter (Signed)
Pt notified Order for bone density at  diagnostic put in. Pt states she wants to call and schedule her own appt. Pt has phone number.

## 2014-01-25 ENCOUNTER — Ambulatory Visit (HOSPITAL_COMMUNITY)
Admission: RE | Admit: 2014-01-25 | Discharge: 2014-01-25 | Disposition: A | Discharge status: Home / Self Care | Payer: Medicare HMO | Source: Ambulatory Visit | Attending: Family Medicine | Admitting: Family Medicine

## 2014-01-25 DIAGNOSIS — M859 Disorder of bone density and structure, unspecified: Secondary | ICD-10-CM | POA: Insufficient documentation

## 2014-01-29 ENCOUNTER — Ambulatory Visit (HOSPITAL_COMMUNITY)
Admission: RE | Admit: 2014-01-29 | Discharge: 2014-01-29 | Disposition: A | Discharge status: Home / Self Care | Payer: Medicare HMO | Source: Ambulatory Visit | Attending: Family Medicine | Admitting: Family Medicine

## 2014-01-29 DIAGNOSIS — R921 Mammographic calcification found on diagnostic imaging of breast: Secondary | ICD-10-CM | POA: Insufficient documentation

## 2014-01-29 DIAGNOSIS — Z09 Encounter for follow-up examination after completed treatment for conditions other than malignant neoplasm: Secondary | ICD-10-CM

## 2014-02-11 ENCOUNTER — Other Ambulatory Visit: Payer: Self-pay | Admitting: *Deleted

## 2014-02-11 MED ORDER — LOSARTAN POTASSIUM 100 MG PO TABS: 100.0000 mg | ORAL_TABLET | Freq: Every day | ORAL | Status: DC

## 2014-02-11 MED ORDER — VERAPAMIL HCL ER 240 MG PO CP24: 240.0000 mg | ORAL_CAPSULE | Freq: Every day | ORAL | Status: DC

## 2014-02-19 ENCOUNTER — Ambulatory Visit (INDEPENDENT_AMBULATORY_CARE_PROVIDER_SITE_OTHER): Payer: Medicare HMO | Admitting: Nurse Practitioner

## 2014-02-19 ENCOUNTER — Encounter: Payer: Self-pay | Admitting: Nurse Practitioner

## 2014-02-19 ENCOUNTER — Other Ambulatory Visit: Payer: Self-pay | Admitting: Nurse Practitioner

## 2014-02-19 VITALS — BP 134/92 | Ht 61.0 in | Wt 212.0 lb

## 2014-02-19 DIAGNOSIS — N950 Postmenopausal bleeding: Secondary | ICD-10-CM

## 2014-02-19 DIAGNOSIS — R102 Pelvic and perineal pain: Secondary | ICD-10-CM

## 2014-02-22 ENCOUNTER — Other Ambulatory Visit: Payer: Self-pay | Admitting: Nurse Practitioner

## 2014-02-22 DIAGNOSIS — R102 Pelvic and perineal pain: Secondary | ICD-10-CM

## 2014-02-23 ENCOUNTER — Encounter: Payer: Self-pay | Admitting: Nurse Practitioner

## 2014-02-23 NOTE — Progress Notes (Signed)
Subjective:  Presents with complaints of postmenopausal bleeding that began last week. This is the first time she's had bleeding since menopause. Had some light spotting for the first 3 days and slightly heavier bleeding over the past 3 days. Mild pelvic cramping. No severe pain. No fever. No discharge. No changes in her medications. Has brought a report with her today for [facility] and hysteroscopy done in 2004 or endometrial hyperplasia and endometrial polyp.  Objective:   BP 134/92 mmHg  Ht 5' 1"  (1.549 m)  Wt 212 lb (96.163 kg)  BMI 40.08 kg/m2 NAD. Alert, oriented. Lungs clear. Heart regular rate rhythm. Abdomen obese soft nondistended. External GU no rashes or lesions. Vagina a small amount of dark blood noted. No CMT. Mild pelvic area tenderness, no rebound or guarding. Difficulty assessing adnexa due to abdominal girth.  Assessment: Post-menopausal bleeding - Plan: US Pelvis Complete  Pelvic pain in female - Plan: US Pelvis Complete  Plan: Further follow-up based on pelvic ultrasound, call back sooner if bleeding worsens.

## 2014-02-27 ENCOUNTER — Other Ambulatory Visit (HOSPITAL_COMMUNITY): Payer: Medicare HMO

## 2014-02-27 ENCOUNTER — Ambulatory Visit (HOSPITAL_COMMUNITY)
Admission: RE | Admit: 2014-02-27 | Discharge: 2014-02-27 | Disposition: A | Discharge status: Home / Self Care | Payer: Medicare HMO | Source: Ambulatory Visit | Attending: Nurse Practitioner | Admitting: Nurse Practitioner

## 2014-02-27 DIAGNOSIS — R102 Pelvic and perineal pain: Secondary | ICD-10-CM | POA: Insufficient documentation

## 2014-02-27 DIAGNOSIS — N950 Postmenopausal bleeding: Secondary | ICD-10-CM | POA: Diagnosis not present

## 2014-02-27 DIAGNOSIS — R938 Abnormal findings on diagnostic imaging of other specified body structures: Secondary | ICD-10-CM | POA: Insufficient documentation

## 2014-02-27 DIAGNOSIS — N8500 Endometrial hyperplasia, unspecified: Secondary | ICD-10-CM | POA: Insufficient documentation

## 2014-03-01 ENCOUNTER — Other Ambulatory Visit: Payer: Self-pay

## 2014-03-01 DIAGNOSIS — N939 Abnormal uterine and vaginal bleeding, unspecified: Secondary | ICD-10-CM

## 2014-03-01 MED ORDER — AMLODIPINE BESYLATE 2.5 MG PO TABS: 2.5000 mg | ORAL_TABLET | Freq: Every day | ORAL | Status: DC

## 2014-03-02 ENCOUNTER — Encounter: Payer: Self-pay | Admitting: Family Medicine

## 2014-03-04 ENCOUNTER — Encounter: Payer: Self-pay | Admitting: Family Medicine

## 2014-03-04 NOTE — Telephone Encounter (Signed)
She may increase her DEffexor BUT doubtful her insurance will pay for what she is requesting. The next step is 150 mg XR one daily.that is what is recoimmended we can go to 150 mg XR one qd ,30 4 refills with follow up in 4 to 6 weeks

## 2014-03-05 ENCOUNTER — Other Ambulatory Visit: Payer: Self-pay | Admitting: *Deleted

## 2014-03-05 MED ORDER — VENLAFAXINE HCL ER 150 MG PO CP24: 75.0000 mg | ORAL_CAPSULE | Freq: Every day | ORAL | Status: DC

## 2014-03-12 ENCOUNTER — Other Ambulatory Visit: Payer: Self-pay | Admitting: Obstetrics & Gynecology

## 2014-03-12 ENCOUNTER — Encounter: Payer: Self-pay | Admitting: Family Medicine

## 2014-03-13 ENCOUNTER — Ambulatory Visit: Payer: Medicare HMO | Admitting: Internal Medicine

## 2014-03-15 LAB — CYTOLOGY - PAP

## 2014-03-19 ENCOUNTER — Ambulatory Visit: Payer: Medicare HMO | Admitting: Internal Medicine

## 2014-03-26 ENCOUNTER — Encounter: Payer: Self-pay | Admitting: Family Medicine

## 2014-03-27 ENCOUNTER — Encounter (HOSPITAL_BASED_OUTPATIENT_CLINIC_OR_DEPARTMENT_OTHER): Payer: Self-pay | Admitting: *Deleted

## 2014-04-01 ENCOUNTER — Encounter: Payer: Self-pay | Admitting: Family Medicine

## 2014-04-01 ENCOUNTER — Ambulatory Visit (INDEPENDENT_AMBULATORY_CARE_PROVIDER_SITE_OTHER): Payer: Medicare (Managed Care) | Admitting: Family Medicine

## 2014-04-01 VITALS — BP 140/90 | Ht 61.0 in | Wt 217.0 lb

## 2014-04-01 DIAGNOSIS — R233 Spontaneous ecchymoses: Secondary | ICD-10-CM

## 2014-04-01 DIAGNOSIS — I10 Essential (primary) hypertension: Secondary | ICD-10-CM

## 2014-04-01 DIAGNOSIS — D699 Hemorrhagic condition, unspecified: Secondary | ICD-10-CM

## 2014-04-01 DIAGNOSIS — R748 Abnormal levels of other serum enzymes: Secondary | ICD-10-CM

## 2014-04-01 LAB — PLATELET FUNCTION ASSAY: COLLAGEN / EPINEPHRINE: 131 s (ref 0–184)

## 2014-04-01 MED ORDER — INDAPAMIDE 1.25 MG PO TABS: 1.2500 mg | ORAL_TABLET | Freq: Every day | ORAL | Status: DC

## 2014-04-01 NOTE — Progress Notes (Signed)
   Subjective:    Patient ID: Kristen Mack, female    DOB: 1945/11/13, 69 y.o.   MRN: [id number]  Hypertension This is a chronic problem. The current episode started more than 1 year ago. The problem has been gradually improving since onset. The problem is controlled. Pertinent negatives include no chest pain or headaches. There are no associated agents to hypertension. There are no known risk factors for coronary artery disease. Treatments tried: amlodipine. The current treatment provides significant improvement. There are no compliance problems.    Patient states that she is having trouble with dizziness since the adding of the Amlodipine.    Review of Systems  Constitutional: Negative for activity change, appetite change and fatigue.  Respiratory: Negative for cough.   Cardiovascular: Negative for chest pain.  Gastrointestinal: Negative for abdominal pain.  Neurological: Negative for headaches.  Psychiatric/Behavioral: Negative for behavioral problems.       Objective:   Physical Exam  Constitutional: She appears well-nourished. No distress.  Cardiovascular: Normal rate, regular rhythm and normal heart sounds.   No murmur heard. Pulmonary/Chest: Effort normal and breath sounds normal. No respiratory distress.  Musculoskeletal: She exhibits no edema.  Lymphadenopathy:    She has no cervical adenopathy.  Neurological: She is alert. She exhibits normal muscle tone.  Psychiatric: Her behavior is normal.  Vitals reviewed.         Assessment & Plan:  HTN-subpar control. She states had side effects with Norvasc. Start Lozol 1.25 every morning patient to send [place] blood pressure readings within the next few weeks Change meds Recheck 3 months Bruising-she has easy bruising on her legs. She has history of elevated liver enzymes. Will check PT PTT bleeding time and CBC  History elevated liver enzymes we will go ahead with rechecking nose she has a follow-up with  gastroenterology in the near future this is significantly concerning regarding her fatty liver she was encouraged to watch diet lose weight  Patient does have appointment with gastroenterology at the start of February

## 2014-04-01 NOTE — Patient Instructions (Signed)
DASH Eating Plan °DASH stands for "Dietary Approaches to Stop Hypertension." The DASH eating plan is a healthy eating plan that has been shown to reduce high blood pressure (hypertension). Additional health benefits may include reducing the risk of type 2 diabetes mellitus, heart disease, and stroke. The DASH eating plan may also help with weight loss. °WHAT DO I NEED TO KNOW ABOUT THE DASH EATING PLAN? °For the DASH eating plan, you will follow these general guidelines: °· Choose foods with a percent daily value for sodium of less than 5% (as listed on the food label). °· Use salt-free seasonings or herbs instead of table salt or sea salt. °· Check with your health care provider or pharmacist before using salt substitutes. °· Eat lower-sodium products, often labeled as "lower sodium" or "no salt added." °· Eat fresh foods. °· Eat more vegetables, fruits, and low-fat dairy products. °· Choose whole grains. Look for the word "whole" as the first word in the ingredient list. °· Choose fish and skinless chicken or turkey more often than red meat. Limit fish, poultry, and meat to 6 oz (170 g) each day. °· Limit sweets, desserts, sugars, and sugary drinks. °· Choose heart-healthy fats. °· Limit cheese to 1 oz (28 g) per day. °· Eat more home-cooked food and less restaurant, buffet, and fast food. °· Limit fried foods. °· Cook foods using methods other than frying. °· Limit canned vegetables. If you do use them, rinse them well to decrease the sodium. °· When eating at a restaurant, ask that your food be prepared with less salt, or no salt if possible. °WHAT FOODS CAN I EAT? °Seek help from a dietitian for individual calorie needs. °Grains °Whole grain or whole wheat bread. Brown rice. Whole grain or whole wheat pasta. Quinoa, bulgur, and whole grain cereals. Low-sodium cereals. Corn or whole wheat flour tortillas. Whole grain cornbread. Whole grain crackers. Low-sodium crackers. °Vegetables °Fresh or frozen vegetables  (raw, steamed, roasted, or grilled). Low-sodium or reduced-sodium tomato and vegetable juices. Low-sodium or reduced-sodium tomato sauce and paste. Low-sodium or reduced-sodium canned vegetables.  °Fruits °All fresh, canned (in natural juice), or frozen fruits. °Meat and Other Protein Products °Ground beef (85% or leaner), grass-fed beef, or beef trimmed of fat. Skinless chicken or turkey. Ground chicken or turkey. Pork trimmed of fat. All fish and seafood. Eggs. Dried beans, peas, or lentils. Unsalted nuts and seeds. Unsalted canned beans. °Dairy °Low-fat dairy products, such as skim or 1% milk, 2% or reduced-fat cheeses, low-fat ricotta or cottage cheese, or plain low-fat yogurt. Low-sodium or reduced-sodium cheeses. °Fats and Oils °Tub margarines without trans fats. Light or reduced-fat mayonnaise and salad dressings (reduced sodium). Avocado. Safflower, olive, or canola oils. Natural peanut or almond butter. °Other °Unsalted popcorn and pretzels. °The items listed above may not be a complete list of recommended foods or beverages. Contact your dietitian for more options. °WHAT FOODS ARE NOT RECOMMENDED? °Grains °White bread. White pasta. White rice. Refined cornbread. Bagels and croissants. Crackers that contain trans fat. °Vegetables °Creamed or fried vegetables. Vegetables in a cheese sauce. Regular canned vegetables. Regular canned tomato sauce and paste. Regular tomato and vegetable juices. °Fruits °Dried fruits. Canned fruit in light or heavy syrup. Fruit juice. °Meat and Other Protein Products °Fatty cuts of meat. Ribs, chicken wings, bacon, sausage, bologna, salami, chitterlings, fatback, hot dogs, bratwurst, and packaged luncheon meats. Salted nuts and seeds. Canned beans with salt. °Dairy °Whole or 2% milk, cream, half-and-half, and cream cheese. Whole-fat or sweetened yogurt. Full-fat   cheeses or blue cheese. Nondairy creamers and whipped toppings. Processed cheese, cheese spreads, or cheese  curds. °Condiments °Onion and garlic salt, seasoned salt, table salt, and sea salt. Canned and packaged gravies. Worcestershire sauce. Tartar sauce. Barbecue sauce. Teriyaki sauce. Soy sauce, including reduced sodium. Steak sauce. Fish sauce. Oyster sauce. Cocktail sauce. Horseradish. Ketchup and mustard. Meat flavorings and tenderizers. Bouillon cubes. Hot sauce. Tabasco sauce. Marinades. Taco seasonings. Relishes. °Fats and Oils °Butter, stick margarine, lard, shortening, ghee, and bacon fat. Coconut, palm kernel, or palm oils. Regular salad dressings. °Other °Pickles and olives. Salted popcorn and pretzels. °The items listed above may not be a complete list of foods and beverages to avoid. Contact your dietitian for more information. °WHERE CAN I FIND MORE INFORMATION? °National Heart, Lung, and [employer] Institute: www.nhlbi.nih.gov/health/health-topics/topics/dash/ °Document Released: 03/04/2011 Document Revised: 07/30/2013 Document Reviewed: 01/17/2013 °ExitCare® Patient Information ©2015 [employer]. This information is not intended to replace advice given to you by your health care provider. Make sure you discuss any questions you have with your health care provider. ° °

## 2014-04-02 LAB — HEPATIC FUNCTION PANEL
ALBUMIN: 3.7 g/dL (ref 3.5–5.2)
ALT: 82 U/L — AB (ref 0–35)
AST: 76 U/L — AB (ref 0–37)
Alkaline Phosphatase: 76 U/L (ref 39–117)
BILIRUBIN DIRECT: 0.1 mg/dL (ref 0.0–0.3)
Indirect Bilirubin: 0.4 mg/dL (ref 0.2–1.2)
TOTAL PROTEIN: 7 g/dL (ref 6.0–8.3)
Total Bilirubin: 0.5 mg/dL (ref 0.2–1.2)

## 2014-04-02 LAB — CBC WITH DIFFERENTIAL/PLATELET
BASOS ABS: 0.1 10*3/uL (ref 0.0–0.1)
BASOS PCT: 1 % (ref 0–1)
EOS ABS: 0.6 10*3/uL (ref 0.0–0.7)
Eosinophils Relative: 8 % — ABNORMAL HIGH (ref 0–5)
HCT: 39.6 % (ref 36.0–46.0)
Hemoglobin: 13.5 g/dL (ref 12.0–15.0)
Lymphocytes Relative: 33 % (ref 12–46)
Lymphs Abs: 2.3 10*3/uL (ref 0.7–4.0)
MCH: 29.6 pg (ref 26.0–34.0)
MCHC: 34.1 g/dL (ref 30.0–36.0)
MCV: 86.8 fL (ref 78.0–100.0)
MONO ABS: 0.5 10*3/uL (ref 0.1–1.0)
MPV: 9.9 fL (ref 8.6–12.4)
Monocytes Relative: 7 % (ref 3–12)
Neutro Abs: 3.5 10*3/uL (ref 1.7–7.7)
Neutrophils Relative %: 51 % (ref 43–77)
PLATELETS: 420 10*3/uL — AB (ref 150–400)
RBC: 4.56 MIL/uL (ref 3.87–5.11)
RDW: 13.8 % (ref 11.5–15.5)
WBC: 6.9 10*3/uL (ref 4.0–10.5)

## 2014-04-02 LAB — APTT: aPTT: 33 seconds (ref 24–37)

## 2014-04-02 LAB — PROTIME-INR
INR: 1.06 (ref <1.50)
Prothrombin Time: 13.8 seconds (ref 11.6–15.2)

## 2014-04-03 ENCOUNTER — Encounter (HOSPITAL_BASED_OUTPATIENT_CLINIC_OR_DEPARTMENT_OTHER): Payer: Self-pay | Admitting: *Deleted

## 2014-04-03 NOTE — Progress Notes (Addendum)
NPO AFTER MN. ARRIVE AT 1015. NEEDS ISTAT AND EKG. WILL DO NEBULIZER AND FLONASE NASAL SPRAY WITH SIPS OF WATER DILUTED APPLE JUICE.   PRE-OP ORDERS PENDING.

## 2014-04-04 ENCOUNTER — Other Ambulatory Visit: Payer: Self-pay | Admitting: Obstetrics & Gynecology

## 2014-04-05 ENCOUNTER — Encounter (HOSPITAL_BASED_OUTPATIENT_CLINIC_OR_DEPARTMENT_OTHER): Payer: Self-pay

## 2014-04-05 ENCOUNTER — Other Ambulatory Visit: Payer: Self-pay

## 2014-04-05 ENCOUNTER — Ambulatory Visit (HOSPITAL_BASED_OUTPATIENT_CLINIC_OR_DEPARTMENT_OTHER)
Admission: RE | Admit: 2014-04-05 | Discharge: 2014-04-05 | Disposition: A | Discharge status: Home / Self Care | Payer: PPO | Source: Ambulatory Visit | Attending: Obstetrics & Gynecology | Admitting: Obstetrics & Gynecology

## 2014-04-05 ENCOUNTER — Ambulatory Visit (HOSPITAL_BASED_OUTPATIENT_CLINIC_OR_DEPARTMENT_OTHER): Payer: PPO | Admitting: Anesthesiology

## 2014-04-05 ENCOUNTER — Encounter (HOSPITAL_BASED_OUTPATIENT_CLINIC_OR_DEPARTMENT_OTHER)
Admission: RE | Disposition: A | Discharge status: Home / Self Care | Payer: Self-pay | Source: Ambulatory Visit | Attending: Obstetrics & Gynecology

## 2014-04-05 DIAGNOSIS — N840 Polyp of corpus uteri: Secondary | ICD-10-CM | POA: Diagnosis not present

## 2014-04-05 DIAGNOSIS — I10 Essential (primary) hypertension: Secondary | ICD-10-CM | POA: Insufficient documentation

## 2014-04-05 DIAGNOSIS — Z882 Allergy status to sulfonamides status: Secondary | ICD-10-CM | POA: Insufficient documentation

## 2014-04-05 DIAGNOSIS — N950 Postmenopausal bleeding: Secondary | ICD-10-CM | POA: Insufficient documentation

## 2014-04-05 DIAGNOSIS — Z885 Allergy status to narcotic agent status: Secondary | ICD-10-CM | POA: Insufficient documentation

## 2014-04-05 DIAGNOSIS — K219 Gastro-esophageal reflux disease without esophagitis: Secondary | ICD-10-CM | POA: Diagnosis not present

## 2014-04-05 DIAGNOSIS — G4733 Obstructive sleep apnea (adult) (pediatric): Secondary | ICD-10-CM | POA: Diagnosis not present

## 2014-04-05 DIAGNOSIS — Z91040 Latex allergy status: Secondary | ICD-10-CM | POA: Insufficient documentation

## 2014-04-05 DIAGNOSIS — J45909 Unspecified asthma, uncomplicated: Secondary | ICD-10-CM | POA: Insufficient documentation

## 2014-04-05 DIAGNOSIS — Z888 Allergy status to other drugs, medicaments and biological substances status: Secondary | ICD-10-CM | POA: Diagnosis not present

## 2014-04-05 DIAGNOSIS — M797 Fibromyalgia: Secondary | ICD-10-CM | POA: Diagnosis not present

## 2014-04-05 DIAGNOSIS — Z8673 Personal history of transient ischemic attack (TIA), and cerebral infarction without residual deficits: Secondary | ICD-10-CM | POA: Insufficient documentation

## 2014-04-05 DIAGNOSIS — E039 Hypothyroidism, unspecified: Secondary | ICD-10-CM | POA: Insufficient documentation

## 2014-04-05 DIAGNOSIS — Z800 Family history of malignant neoplasm of digestive organs: Secondary | ICD-10-CM | POA: Insufficient documentation

## 2014-04-05 HISTORY — DX: Fatty (change of) liver, not elsewhere classified: K76.0

## 2014-04-05 HISTORY — DX: Essential (primary) hypertension: I10

## 2014-04-05 HISTORY — DX: Hypothyroidism, unspecified: E03.9

## 2014-04-05 HISTORY — DX: Personal history of adenomatous and serrated colon polyps: Z86.0101

## 2014-04-05 HISTORY — DX: Postmenopausal bleeding: N95.0

## 2014-04-05 HISTORY — DX: Cracked tooth: K03.81

## 2014-04-05 HISTORY — DX: Personal history of other diseases of the circulatory system: Z86.79

## 2014-04-05 HISTORY — PX: HYSTEROSCOPY WITH D & C: SHX1775

## 2014-04-05 HISTORY — DX: Personal history of transient ischemic attack (TIA), and cerebral infarction without residual deficits: Z86.73

## 2014-04-05 HISTORY — DX: Unspecified osteoarthritis, unspecified site: M19.90

## 2014-04-05 HISTORY — DX: Obstructive sleep apnea (adult) (pediatric): G47.33

## 2014-04-05 HISTORY — DX: Personal history of colonic polyps: Z86.010

## 2014-04-05 HISTORY — DX: Prediabetes: R73.03

## 2014-04-05 HISTORY — DX: Other specified disorders of teeth and supporting structures: K08.89

## 2014-04-05 LAB — POCT I-STAT 4, (NA,K, GLUC, HGB,HCT)
Glucose, Bld: 134 mg/dL — ABNORMAL HIGH (ref 70–99)
HCT: 41 % (ref 36.0–46.0)
HEMOGLOBIN: 13.9 g/dL (ref 12.0–15.0)
Potassium: 3.2 mmol/L — ABNORMAL LOW (ref 3.5–5.1)
Sodium: 143 mmol/L (ref 135–145)

## 2014-04-05 SURGERY — DILATATION AND CURETTAGE /HYSTEROSCOPY
Anesthesia: General | Site: Vagina

## 2014-04-05 MED ORDER — HYDROMORPHONE HCL 1 MG/ML IJ SOLN
0.2500 mg | INTRAMUSCULAR | Status: DC | PRN
  Filled 2014-04-05: qty 1

## 2014-04-05 MED ORDER — FENTANYL CITRATE 0.05 MG/ML IJ SOLN
INTRAMUSCULAR | Status: AC
  Filled 2014-04-05: qty 2

## 2014-04-05 MED ORDER — LIDOCAINE HCL (CARDIAC) 20 MG/ML IV SOLN
INTRAVENOUS | Status: DC | PRN
  Administered 2014-04-05: 100 mg via INTRAVENOUS

## 2014-04-05 MED ORDER — LACTATED RINGERS IV SOLN
INTRAVENOUS | Status: DC
  Administered 2014-04-05: 12:00:00 via INTRAVENOUS
  Filled 2014-04-05: qty 1000

## 2014-04-05 MED ORDER — PROMETHAZINE HCL 25 MG/ML IJ SOLN
6.2500 mg | INTRAMUSCULAR | Status: DC | PRN
  Filled 2014-04-05: qty 1

## 2014-04-05 MED ORDER — DEXAMETHASONE SODIUM PHOSPHATE 4 MG/ML IJ SOLN
INTRAMUSCULAR | Status: DC | PRN
  Administered 2014-04-05: 10 mg via INTRAVENOUS

## 2014-04-05 MED ORDER — KETOROLAC TROMETHAMINE 30 MG/ML IJ SOLN
INTRAMUSCULAR | Status: DC | PRN
  Administered 2014-04-05: 30 mg via INTRAVENOUS

## 2014-04-05 MED ORDER — GLYCINE 1.5 % IR SOLN
Status: DC | PRN
  Administered 2014-04-05: 9000 mL

## 2014-04-05 MED ORDER — OXYCODONE-ACETAMINOPHEN 5-325 MG PO TABS
1.0000 | ORAL_TABLET | ORAL | Status: DC | PRN
  Filled 2014-04-05: qty 2

## 2014-04-05 MED ORDER — FENTANYL CITRATE 0.05 MG/ML IJ SOLN
INTRAMUSCULAR | Status: DC | PRN
  Administered 2014-04-05: 50 ug via INTRAVENOUS
  Administered 2014-04-05 (×2): 25 ug via INTRAVENOUS
  Administered 2014-04-05 (×2): 50 ug via INTRAVENOUS

## 2014-04-05 MED ORDER — ONDANSETRON HCL 4 MG PO TABS
4.0000 mg | ORAL_TABLET | Freq: Four times a day (QID) | ORAL | Status: DC | PRN
  Filled 2014-04-05: qty 1

## 2014-04-05 MED ORDER — ONDANSETRON HCL 4 MG/2ML IJ SOLN
INTRAMUSCULAR | Status: DC | PRN
  Administered 2014-04-05: 4 mg via INTRAVENOUS

## 2014-04-05 MED ORDER — PROPOFOL 10 MG/ML IV BOLUS
INTRAVENOUS | Status: DC | PRN
  Administered 2014-04-05: 200 mg via INTRAVENOUS

## 2014-04-05 MED ORDER — LIDOCAINE HCL 1 % IJ SOLN
INTRAMUSCULAR | Status: DC | PRN
  Administered 2014-04-05: 10 mL

## 2014-04-05 MED ORDER — MIDAZOLAM HCL 2 MG/2ML IJ SOLN
INTRAMUSCULAR | Status: AC
  Filled 2014-04-05: qty 2

## 2014-04-05 MED ORDER — MIDAZOLAM HCL 5 MG/5ML IJ SOLN
INTRAMUSCULAR | Status: DC | PRN
  Administered 2014-04-05: 1 mg via INTRAVENOUS

## 2014-04-05 MED ORDER — LACTATED RINGERS IV SOLN
INTRAVENOUS | Status: DC
  Filled 2014-04-05: qty 1000

## 2014-04-05 MED ORDER — HYDROMORPHONE HCL 1 MG/ML IJ SOLN
0.2000 mg | INTRAMUSCULAR | Status: DC | PRN
  Filled 2014-04-05: qty 1

## 2014-04-05 MED ORDER — ONDANSETRON HCL 4 MG/2ML IJ SOLN
4.0000 mg | Freq: Four times a day (QID) | INTRAMUSCULAR | Status: DC | PRN
  Filled 2014-04-05: qty 2

## 2014-04-05 MED ORDER — SILVER NITRATE-POT NITRATE 75-25 % EX MISC
CUTANEOUS | Status: DC | PRN
  Administered 2014-04-05: 1

## 2014-04-05 SURGICAL SUPPLY — 36 items
AQUILEX ×3 IMPLANT
CANISTER SUCTION 2500CC (MISCELLANEOUS) ×9 IMPLANT
CATH ROBINSON RED A/P 16FR (CATHETERS) ×3 IMPLANT
CATH SILICONE 16FRX5CC (CATHETERS) IMPLANT
COVER TABLE BACK 60X90 (DRAPES) ×3 IMPLANT
DRAPE CAMERA CLOSED 9X96 (DRAPES) IMPLANT
DRAPE LG THREE QUARTER DISP (DRAPES) ×3 IMPLANT
DRSG TELFA 3X8 NADH (GAUZE/BANDAGES/DRESSINGS) ×3 IMPLANT
ELECT REM PT RETURN 9FT ADLT (ELECTROSURGICAL) ×3
ELECTRODE REM PT RTRN 9FT ADLT (ELECTROSURGICAL) ×1 IMPLANT
GLOVE BIO SURGEON STRL SZ 6.5 (GLOVE) ×2 IMPLANT
GLOVE BIO SURGEONS STRL SZ 6.5 (GLOVE) ×1
GLOVE BIOGEL M 6.5 STRL (GLOVE) ×3 IMPLANT
GLOVE BIOGEL PI IND STRL 6.5 (GLOVE) ×1 IMPLANT
GLOVE BIOGEL PI IND STRL 7.0 (GLOVE) ×1 IMPLANT
GLOVE BIOGEL PI IND STRL 7.5 (GLOVE) ×1 IMPLANT
GLOVE BIOGEL PI INDICATOR 6.5 (GLOVE) ×2
GLOVE BIOGEL PI INDICATOR 7.0 (GLOVE) ×2
GLOVE BIOGEL PI INDICATOR 7.5 (GLOVE) ×2
GLOVE SKINSENSE NS SZ6.5 (GLOVE) ×2
GLOVE SKINSENSE NS SZ7.0 (GLOVE) ×2
GLOVE SKINSENSE STRL SZ6.5 (GLOVE) ×1 IMPLANT
GLOVE SKINSENSE STRL SZ7.0 (GLOVE) ×1 IMPLANT
GLYCINE 1.5% IRRIG UROMATIC (IV SOLUTION) ×9 IMPLANT
GOWN STRL NON-REIN LRG LVL3 (GOWN DISPOSABLE) IMPLANT
GOWN STRL REIN XL XLG (GOWN DISPOSABLE) IMPLANT
GOWN STRL REUS W/TWL LRG LVL3 (GOWN DISPOSABLE) ×3 IMPLANT
GOWN STRL REUS W/TWL XL LVL3 (GOWN DISPOSABLE) ×3 IMPLANT
LEGGING LITHOTOMY PAIR STRL (DRAPES) ×3 IMPLANT
LOOP ANGLED CUTTING 22FR (CUTTING LOOP) ×3 IMPLANT
NEEDLE SPNL 25GX3.5 QUINCKE BL (NEEDLE) ×3 IMPLANT
SYR CONTROL 10ML LL (SYRINGE) ×3 IMPLANT
TOWEL OR 17X24 6PK STRL BLUE (TOWEL DISPOSABLE) ×6 IMPLANT
TRAY DSU PREP LF (CUSTOM PROCEDURE TRAY) ×3 IMPLANT
TUBE HYSTEROSCOPY W Y-CONNECT (TUBING) IMPLANT
WATER STERILE IRR 500ML POUR (IV SOLUTION) ×3 IMPLANT

## 2014-04-05 NOTE — Anesthesia Postprocedure Evaluation (Signed)
Anesthesia Post Note  Patient: Kristen Mack  Procedure(s) Performed: Procedure(s) (LRB): DILATATION AND CURETTAGE /HYSTEROSCOPY, ENDOMETRIAL POLYPECTOMY (N/A)  Anesthesia type: general  Patient location: PACU  Post pain: Pain level controlled  Post assessment: Patient's Cardiovascular Status Stable  Last Vitals:  Filed Vitals:   04/05/14 1510  BP: 152/87  Pulse: 70  Temp: 36.6 C  Resp: 16    Post vital signs: Reviewed and stable  Level of consciousness: sedated  Complications: No apparent anesthesia complications

## 2014-04-05 NOTE — Transfer of Care (Signed)
Immediate Anesthesia Transfer of Care Note  Patient: Kristen Mack  Procedure(s) Performed: Procedure(s): DILATATION AND CURETTAGE /HYSTEROSCOPY, ENDOMETRIAL POLYPECTOMY (N/A)  Patient Location: PACU  Anesthesia Type:General  Level of Consciousness: awake and oriented  Airway & Oxygen Therapy: Patient Spontanous Breathing and Patient connected to nasal cannula oxygen  Post-op Assessment: Report given to PACU RN  Post vital signs: Reviewed and stable  Complications: No apparent anesthesia complications

## 2014-04-05 NOTE — Discharge Instructions (Signed)
° °  D & C Home care Instructions:   Personal hygiene:  Used sanitary napkins for vaginal drainage not tampons. Shower or tub bathe the day after your procedure. No douching until bleeding stops. Always wipe from front to back after  Elimination.  Activity: Do not drive or operate any equipment today. The effects of the anesthesia are still present and drowsiness may result. Rest today, not necessarily flat bed rest, just take it easy. You may resume your normal activity in one to 2 days.  Sexual activity: No intercourse for one week or as indicated by your physician  Diet: Eat a light diet as desired this evening. You may resume a regular diet tomorrow.  Return to work: One to 2 days.  General Expectations of your surgery: Vaginal bleeding should be no heavier than a normal period. Spotting may continue up to 10 days. Mild cramps may continue for a couple of days. You may have a regular period in 2-6 weeks.  Unexpected observations call your doctor if these occur: persistent or heavy bleeding. Severe abdominal cramping or pain. Elevation of temperature greater than 100F.  Call for an appointment in one week.    Patient's Signature_______________________________________________________  Nurse's Signature________________________________________________________   Post Anesthesia Home Care Instructions  Activity: Get plenty of rest for the remainder of the day. A responsible adult should stay with you for 24 hours following the procedure.  For the next 24 hours, DO NOT: -Drive a car -Paediatric nurse -Drink alcoholic beverages -Take any medication unless instructed by your physician -Make any legal decisions or sign important papers.  Meals: Start with liquid foods such as gelatin or soup. Progress to regular foods as tolerated. Avoid greasy, spicy, heavy foods. If nausea and/or vomiting occur, drink only clear liquids until the nausea and/or vomiting subsides. Call your physician  if vomiting continues.  Special Instructions/Symptoms: Your throat may feel dry or sore from the anesthesia or the breathing tube placed in your throat during surgery. If this causes discomfort, gargle with warm salt water. The discomfort should disappear within 24 hours.

## 2014-04-05 NOTE — Anesthesia Procedure Notes (Signed)
Procedure Name: LMA Insertion Date/Time: 04/05/2014 12:15 PM Performed by: Bethena Roys T Pre-anesthesia Checklist: Patient identified, Emergency Drugs available, Suction available and Patient being monitored Patient Re-evaluated:Patient Re-evaluated prior to inductionOxygen Delivery Method: Circle System Utilized Preoxygenation: Pre-oxygenation with 100% oxygen Intubation Type: IV induction Ventilation: Mask ventilation without difficulty LMA: LMA with gastric port inserted LMA Size: 5.0 Number of attempts: 1 Placement Confirmation: positive ETCO2 Tube secured with: Tape Dental Injury: Teeth and Oropharynx as per pre-operative assessment

## 2014-04-05 NOTE — Op Note (Signed)
Procedure(s): DILATATION AND CURETTAGE /HYSTEROSCOPY, ENDOMETRIAL POLYPECTOMY Procedure Note  Kristen Mack female 69 y.o. 04/05/2014  Procedure(s) and Anesthesia Type:    * DILATATION AND CURETTAGE /HYSTEROSCOPY, ENDOMETRIAL POLYPECTOMY - General  Surgeon(s) and Role:    * Sanjuana Kava, MD - Primary   Indications: The patient was brought to same day surgery for diagnostic hysteroscopy D&C possible polypectomy for post menopausal bleeding. Her office endometrial biopsy showed endometrial polyp.      Procedure Detail  DILATATION AND CURETTAGE /HYSTEROSCOPY, ENDOMETRIAL POLYPECTOMY  Findings: Exam under anesthesia: External vulva: menopausal atrophy, vagina normal cervix normal  Uterus slightly enlarged, midline mobile no palpable masses, adnexa: no palpable mass Hysteroscopy: midline large endometrial polyp, vascularity and calcifications throughout the polyp.  Bilateral tubal ostia visualized  Estimated Blood Loss:  less than 50 mL         Drains: none         Total IV Fluids: 600 ml  Blood Given: none          Specimens: Endometrial polyp and endometrial curettings         Implants: none        Complications:  * No complications entered in OR log *         Disposition: PACU - hemodynamically stable.         Condition: stable  Anesthesia: General LMA anesthesia  ASA Class: 2   After adequate anesthesia was achieved, the patient was prepped and draped in the usual sterile fashion.  The speculum was placed in the vagina and the cervix stabilized with a single-tooth tenaculum.  A paracervical block was performed at 4 and 8 o'clock with 10 mL of 1% lidocaine. The  cervix was dilated with hank dilators and sounded to 10 cm. The 12 degree hysteroscope passed inside the endometrial cavity. The above findings were noted and sharp curettage was then performed and uterine curettings sent to path. The cervix was further dilated up to 26Fr Hanks and an operative hysteroscope  was used with loop monopolar cautery attached to transect the endometrial polyp. The polyp was removed in pieces using Randal stone polyp forceps. Hysteroscopy showed that the polyp was removed in it's entirety at the root. Minimal bleeding was noted from endometrial canal.  All instruments were removed from the vagina. Glycine was used as a endometrial distension medium and the deficit was 616 mL.  The tenaculum sites were made hemostatic with silver nitrate sticks.The patient tolerated the procedure well.    [name] STACIA   [place], [place]

## 2014-04-05 NOTE — H&P (Signed)
Kristen Mack is an 69 y.o. female  Presents for scheduled D&C Hysteroscopy for several episodes of  Post menopausal bleeding. Imaging shows a large complex mass in endometrium measuring 41m.  Endometrial biopsy negative for malignancy positive for endometrial polyp  Pertinent Gynecological History: Menses: post-menopausal Bleeding: post menopausal bleeding Contraception: none DES exposure: denies Blood transfusions: none Sexually transmitted diseases: no past history Previous GYN Procedures: DNC  Last mammogram: normal Date: 2015 Last pap: normal Date: 02/2014 OB History: G2, P2   Menstrual History: Menarche age: [age] No LMP recorded. Patient is postmenopausal.    Past Medical History  Diagnosis Date  . GERD (gastroesophageal reflux disease)   . Asthma   . CFS (chronic fatigue syndrome)   . Fibromyalgia   . Meniere disease   . IBS (irritable bowel syndrome)   . Osteopenia   . Hypertension   . History of TIA (transient ischemic attack)     per CT scan   . Hypothyroidism   . Prediabetes   . History of adenomatous polyp of colon   . Family history of colon cancer   . PMB (postmenopausal bleeding)   . Fatty liver disease, nonalcoholic   . History of cardiac murmur     until age 69 . OSA (obstructive sleep apnea)     CPAP intolerate -- per study 2010  moderate osa--  STATES HAS RAISED HOB  . Arthritis   . Tooth loose     LOWER FRONT  . Cracked tooth     X2  RIGHT LOWER MOLAR AND LEFT UPPER MOLAR    Past Surgical History  Procedure Laterality Date  . Cesarean section  1[place] . Esophagogastroduodenoscopy  02-08-2003  . Colonoscopy N/A 04/19/2013    Procedure: COLONOSCOPY;  Surgeon: NRogene Houston MD;  [facility];  Service: Endoscopy;  Laterality: N/A;    . Hysteroscopy w/d&c  11-14-2002    endometrial polypectomy  . Liver biopsy  01-29-2004    benign  . Cholecystectomy  1995    and  REPAIR UMBILICAL HERNIA    Family History   Problem Relation Age of Onset  . Rectal cancer Brother 466 . Ovarian cancer Mother 562 . Hypertension Mother   . Diabetes Mother   . Hypertension Father   . Diabetes Father   . Breast cancer Maternal Aunt   . Breast cancer Paternal Aunt     four Paternal aunts  . Heart disease Paternal Grandfather     Social History:  reports that she quit smoking about 45 years ago. Her smoking use included Cigarettes. She has a 1.5 pack-year smoking history. She has never used smokeless tobacco. She reports that she does not drink alcohol or use illicit drugs.  Allergies:  Allergies  Allergen Reactions  . Ambien [Zolpidem Tartrate] Other (See Comments)    Side effect "last a couple days"  . Codeine Itching and Nausea And Vomiting  . Demerol [Meperidine] Other (See Comments)    "messes up senses"  . Latex Other (See Comments)    "skin gets raw"  . Sulfa Antibiotics Other (See Comments)    Severe abd cramp  . Amlodipine Other (See Comments)    dizzy  . Celexa [Citalopram Hydrobromide] Other (See Comments)    Mouth sores    Prescriptions prior to admission  Medication Sig Dispense Refill Last Dose  . albuterol (PROVENTIL HFA;VENTOLIN HFA) 108 (90 BASE) MCG/ACT inhaler Inhale 2 puffs into the lungs  every 6 (six) hours as needed. For shortness of breath 3 Inhaler 1 04/05/2014 at 0800  . albuterol (PROVENTIL) (2.5 MG/3ML) 0.083% nebulizer solution Take 2.5 mg by nebulization every morning. Pt  Does nebulizer am daily and prn   Past Week at Unknown time  . ASTRAGALUS PO Take 1 tablet by mouth daily.    04/04/2014 at 0900  . B Complex Vitamins (B COMPLEX 100 PO) Take 1 tablet by mouth daily.   04/04/2014 at 0900  . Barberry-Oreg Grape-Goldenseal (BERBERINE COMPLEX PO) Take 1 tablet by mouth daily.    04/04/2014 at 2100  . cetirizine (ZYRTEC) 10 MG tablet Take 10 mg by mouth daily.   04/04/2014 at 0900  . Cholecalciferol (VITAMIN D3) 5000 UNITS CAPS Take 1 capsule by mouth daily.   04/04/2014 at 1800  .  Chromium 1000 MCG TABS Take 1 tablet by mouth daily.    04/04/2014 at 1800  . Coenzyme Q10 (COQ10) 200 MG CAPS Take 1 capsule by mouth daily.   04/04/2014 at 0900  . Cyanocobalamin (B-12) 1000 MCG SUBL Place 1 tablet under the tongue daily.    04/04/2014 at 0900  . DIGESTIVE ENZYMES PO Take 1 tablet by mouth 2 (two) times daily as needed.    04/04/2014 at 1800  . fluticasone (FLONASE) 50 MCG/ACT nasal spray Place 2 sprays into both nostrils daily. For congestion 16 g 1 04/05/2014 at 0800  . ibuprofen (ADVIL,MOTRIN) 200 MG tablet Take 200-400 mg by mouth every 6 (six) hours as needed. For pain   04/04/2014 at 1800  . indapamide (LOZOL) 1.25 MG tablet Take 1 tablet (1.25 mg total) by mouth daily. (Patient taking differently: Take 1.25 mg by mouth every morning. ) 30 tablet 6 04/03/2014 at 0600  . ipratropium (ATROVENT HFA) 17 MCG/ACT inhaler Inhale 2 puffs into the lungs every 6 (six) hours as needed for wheezing. 3 Inhaler 1 Past Month at Unknown time  . ipratropium (ATROVENT) 0.02 % nebulizer solution USE ONE VIAL IN NEBULIZER 4 TIMES DAILY AS NEEDED FOR SHORTNESS OF BREATH (Patient taking differently: USE ONE VIAL IN NEBULIZER 4 TIMES DAILY AS NEEDED FOR SHORTNESS OF BREATH--  pt uses DAILY AM AND PRN) 5 mL 0 Past Month at Unknown time  . levothyroxine (SYNTHROID, LEVOTHROID) 125 MCG tablet 1/2 on Tues and Thur , 1  On all other days (Patient taking differently: Take 75-125 mcg by mouth as directed. 1/2 tablet on Tues and Thur , 1 tablet  On all other days--  TAKES IN EVENING) 90 tablet 1 04/04/2014 at 2100  . losartan (COZAAR) 100 MG tablet Take 1 tablet (100 mg total) by mouth daily. (Patient taking differently: Take 100 mg by mouth every evening. ) 90 tablet 0 04/04/2014 at 2100  . Lysine HCl 500 MG TABS Take 1 tablet by mouth daily. Take as needed   04/04/2014 at 0900  . Magnesium 250 MG TABS Take by mouth 2 (two) times daily.   04/04/2014 at 0900  . meclizine (ANTIVERT) 50 MG tablet Take 50 mg by mouth daily as needed  for dizziness. Take as needed   04/04/2014 at 2100  . Melatonin 10 MG CAPS Take by mouth at bedtime.   04/04/2014 at 2100  . montelukast (SINGULAIR) 10 MG tablet Take 1 tablet (10 mg total) by mouth at bedtime. 90 tablet 1 04/04/2014 at 2100  . OVER THE COUNTER MEDICATION Take 1 tablet by mouth daily. Bladderwrack leaves PO capsule   Past Week at Unknown time  .  potassium chloride SA (K-DUR,KLOR-CON) 20 MEQ tablet Take 1 tablet (20 mEq total) by mouth daily. 90 tablet 1 04/04/2014 at 0900  . venlafaxine XR (EFFEXOR-XR) 150 MG 24 hr capsule Take 1 capsule (150 mg total) by mouth daily with breakfast. 30 capsule 4 04/04/2014 at 0900  . verapamil (VERELAN PM) 240 MG 24 hr capsule Take 1 capsule (240 mg total) by mouth daily. (Patient taking differently: Take 240 mg by mouth every evening. ) 90 capsule 0 04/04/2014 at 2100  . VITAMIN A PO Take 1 capsule by mouth daily.   Taking  . vitamin C (ASCORBIC ACID) 500 MG tablet Take 2,000 mg by mouth daily.    Taking  . vitamin E 100 UNIT capsule Take 100 Units by mouth daily.   Taking    ROS  Blood pressure 176/87, pulse 78, temperature 97.7 F (36.5 C), temperature source Oral, resp. rate 18, weight 96.163 kg (212 lb), SpO2 96 %. Physical Exam  Results for orders placed or performed during the hospital encounter of 04/05/14 (from the past 24 hour(s))  I-STAT 4, (NA,K, GLUC, HGB,HCT)     Status: Abnormal   Collection Time: 04/05/14 11:38 AM  Result Value Ref Range   Sodium 143 135 - 145 mmol/L   Potassium 3.2 (L) 3.5 - 5.1 mmol/L   Glucose, Bld 134 (H) 70 - 99 mg/dL   HCT 41.0 36.0 - 46.0 %   Hemoglobin 13.9 12.0 - 15.0 g/dL    No results found.  Assessment/Plan: 69 yo with post menopausal bleeding Endometrial polyp suspected To OR for Dilatation and Curettage, polypectomy, diagnostic hysteroscopy Risks of surgery previously explained to patient in office  [place], [place] 04/05/2014, 12:01 PM

## 2014-04-05 NOTE — Anesthesia Preprocedure Evaluation (Signed)
Anesthesia Evaluation  Patient identified by MRN, date of birth, ID band Patient awake    Reviewed: Allergy & Precautions, NPO status , Patient's Chart, lab work & pertinent test results  Airway Mallampati: II  TM Distance: >3 FB Neck ROM: Full    Dental  (+) Poor Dentition, Dental Advisory Given   Pulmonary asthma , sleep apnea , former smoker,    Pulmonary exam normal       Cardiovascular hypertension, negative cardio ROS      Neuro/Psych PSYCHIATRIC DISORDERS Depression negative neurological ROS  negative psych ROS   GI/Hepatic Neg liver ROS, GERD-  ,  Endo/Other  Hypothyroidism   Renal/GU negative Renal ROS  negative genitourinary   Musculoskeletal  (+) Arthritis -, Fibromyalgia -  Abdominal   Peds negative pediatric ROS (+)  Hematology negative hematology ROS (+)   Anesthesia Other Findings   Reproductive/Obstetrics negative OB ROS                             Anesthesia Physical Anesthesia Plan  ASA: III  Anesthesia Plan: General   Post-op Pain Management:    Induction: Intravenous  Airway Management Planned: LMA  Additional Equipment:   Intra-op Plan:   Post-operative Plan: Extubation in OR  Informed Consent: I have reviewed the patients History and Physical, chart, labs and discussed the procedure including the risks, benefits and alternatives for the proposed anesthesia with the patient or authorized representative who has indicated his/her understanding and acceptance.   Dental advisory given  Plan Discussed with: CRNA and Anesthesiologist  Anesthesia Plan Comments:         Anesthesia Quick Evaluation

## 2014-04-07 ENCOUNTER — Encounter: Payer: Self-pay | Admitting: Family Medicine

## 2014-04-08 ENCOUNTER — Encounter: Payer: Self-pay | Admitting: Family Medicine

## 2014-04-08 ENCOUNTER — Encounter (HOSPITAL_BASED_OUTPATIENT_CLINIC_OR_DEPARTMENT_OTHER): Payer: Self-pay | Admitting: Obstetrics & Gynecology

## 2014-04-08 MED ORDER — GLUCOSE BLOOD VI STRP: ORAL_STRIP | Status: DC

## 2014-04-09 NOTE — Telephone Encounter (Signed)
Tell the patient on cladribine here everything went well at already received copies of the pathology. Please also renew her medicines requested for 6 months

## 2014-04-09 NOTE — Telephone Encounter (Signed)
Not sure why it came back see previous message

## 2014-04-10 MED ORDER — IPRATROPIUM BROMIDE HFA 17 MCG/ACT IN AERS: 2.0000 | INHALATION_SPRAY | Freq: Four times a day (QID) | RESPIRATORY_TRACT | Status: DC | PRN

## 2014-04-10 MED ORDER — MONTELUKAST SODIUM 10 MG PO TABS: 10.0000 mg | ORAL_TABLET | Freq: Every day | ORAL | Status: DC

## 2014-04-10 MED ORDER — FLUTICASONE PROPIONATE 50 MCG/ACT NA SUSP: 2.0000 | Freq: Every day | NASAL | Status: DC

## 2014-04-10 MED ORDER — ALBUTEROL SULFATE HFA 108 (90 BASE) MCG/ACT IN AERS: 2.0000 | INHALATION_SPRAY | Freq: Four times a day (QID) | RESPIRATORY_TRACT | Status: DC | PRN

## 2014-04-10 MED ORDER — LEVOTHYROXINE SODIUM 125 MCG PO TABS: ORAL_TABLET | ORAL | Status: DC

## 2014-04-10 NOTE — Addendum Note (Signed)
Addended by: Dairl Ponder on: 04/10/2014 10:06 AM   Modules accepted: Orders

## 2014-04-24 ENCOUNTER — Encounter: Payer: Self-pay | Admitting: Internal Medicine

## 2014-04-30 ENCOUNTER — Encounter: Payer: Self-pay | Admitting: Internal Medicine

## 2014-04-30 ENCOUNTER — Ambulatory Visit (INDEPENDENT_AMBULATORY_CARE_PROVIDER_SITE_OTHER): Payer: PPO | Admitting: Internal Medicine

## 2014-04-30 ENCOUNTER — Other Ambulatory Visit (INDEPENDENT_AMBULATORY_CARE_PROVIDER_SITE_OTHER): Payer: PPO

## 2014-04-30 VITALS — BP 112/74 | HR 68 | Ht 61.0 in | Wt 215.0 lb

## 2014-04-30 DIAGNOSIS — R945 Abnormal results of liver function studies: Principal | ICD-10-CM

## 2014-04-30 DIAGNOSIS — K219 Gastro-esophageal reflux disease without esophagitis: Secondary | ICD-10-CM

## 2014-04-30 DIAGNOSIS — R7989 Other specified abnormal findings of blood chemistry: Secondary | ICD-10-CM | POA: Diagnosis not present

## 2014-04-30 DIAGNOSIS — K760 Fatty (change of) liver, not elsewhere classified: Secondary | ICD-10-CM

## 2014-04-30 LAB — IBC PANEL
IRON: 55 ug/dL (ref 42–145)
Saturation Ratios: 12.8 % — ABNORMAL LOW (ref 20.0–50.0)
TRANSFERRIN: 306 mg/dL (ref 212.0–360.0)

## 2014-04-30 LAB — FERRITIN: Ferritin: 122.4 ng/mL (ref 10.0–291.0)

## 2014-04-30 LAB — IRON: Iron: 55 ug/dL (ref 42–145)

## 2014-04-30 NOTE — Patient Instructions (Signed)
Your physician has requested that you go to the basement for lab work before leaving today

## 2014-04-30 NOTE — Progress Notes (Signed)
HISTORY OF PRESENT ILLNESS:  Kristen Mack is a 69 y.o. female with multiple medical problems as listed below. She is sent here today by her primary care provider regarding abnormal liver tests. She is accompanied by her daughter. Multiple outside records, including those from [place], reviewed. The patient has been morbidly obese most of her adult life. She has been known to have abnormal liver test for greater than 10 years. She was evaluated in November 2005, all living in [place], with liver biopsy. The biopsy showed marked steatosis with minimal inflammation and no fibrosis. Other laboratory studies evaluating for elevated liver tests have included normal hepatology serologies, negative ANA and ceruloplasmin. Also, negative celiac profile. CBC including MCV and platelets have been normal as have her prothrombin time/INR. The patient denies alcohol use. Her current BMI is 41. Abdominal ultrasound shows changes consistent with fatty liver without other abnormalities such as portal hypertension. Transaminases have generally been approximate 2 times the upper limit of normal with normal alkaline phosphatase and bilirubin as well as protein and albumin. She has no complaints relevant to her liver. She does take on demand digestive enzymes 4 acid reflux and bloating. Patient did undergo upper endoscopy with duodenal biopsies in 2006. These were negative. Also, she underwent complete colonoscopy in 2015 with Dr. Laural Golden, for screening purposes.  REVIEW OF SYSTEMS:  All non-GI ROS negative except for decreased memory, forgetfulness, fatigue, shortness of breath, arthritis,  Past Medical History  Diagnosis Date  . GERD (gastroesophageal reflux disease)   . Asthma   . CFS (chronic fatigue syndrome)   . Fibromyalgia   . Meniere disease   . IBS (irritable bowel syndrome)   . Osteopenia   . Hypertension   . History of TIA (transient ischemic attack)     per CT scan   . Hypothyroidism   .  Prediabetes   . History of adenomatous polyp of colon   . PMB (postmenopausal bleeding)   . Fatty liver disease, nonalcoholic   . History of cardiac murmur     until age 75  . OSA (obstructive sleep apnea)     CPAP intolerate -- per study 2010  moderate osa--  STATES HAS RAISED HOB  . Arthritis   . Tooth loose     LOWER FRONT  . Cracked tooth     X2  RIGHT LOWER MOLAR AND LEFT UPPER MOLAR    Past Surgical History  Procedure Laterality Date  . Cesarean section  [place]  . Esophagogastroduodenoscopy  02-08-2003  . Colonoscopy N/A 04/19/2013    Procedure: COLONOSCOPY;  Surgeon: Rogene Houston, MD;  [facility];  Service: Endoscopy;  Laterality: N/A;    . Hysteroscopy w/d&c  11-14-2002    endometrial polypectomy  . Liver biopsy  01-29-2004    benign  . Cholecystectomy  1995    and  REPAIR UMBILICAL HERNIA  . Hysteroscopy w/d&c N/A 04/05/2014    Procedure: DILATATION AND CURETTAGE /HYSTEROSCOPY, ENDOMETRIAL POLYPECTOMY;  Surgeon: Sanjuana Kava, MD;  Location: [facility];  Service: Gynecology;  Laterality: N/A;    Social History Kristen Mack  reports that she quit smoking about 45 years ago. Her smoking use included Cigarettes. She has a 1.5 pack-year smoking history. She has never used smokeless tobacco. She reports that she does not drink alcohol or use illicit drugs.  family history includes Breast cancer in her maternal aunt and paternal aunt; Diabetes in her father and mother; Heart disease in  her paternal grandfather; Hypertension in her father and mother; Ovarian cancer (age of onset: 59) in her mother; Rectal cancer (age of onset: 79) in her brother.  Allergies  Allergen Reactions  . Ambien [Zolpidem Tartrate] Other (See Comments)    Side effect "last a couple days"  . Codeine Itching and Nausea And Vomiting  . Demerol [Meperidine] Other (See Comments)    "messes up senses"  . Latex Other (See Comments)    "skin gets raw"  . Sulfa  Antibiotics Other (See Comments)    Severe abd cramp  . Amlodipine Other (See Comments)    dizzy  . Celexa [Citalopram Hydrobromide] Other (See Comments)    Mouth sores       PHYSICAL EXAMINATION: Vital signs: BP 112/74 mmHg  Pulse 68  Ht 5' 1"  (1.549 m)  Wt 215 lb (97.523 kg)  BMI 40.64 kg/m2  Constitutional: Obese, unhealthy appearing, no acute distress Psychiatric: alert and oriented x3, cooperative Eyes: extraocular movements intact, anicteric, conjunctiva pink Mouth: oral pharynx moist, no lesions Neck: supple no lymphadenopathy Cardiovascular: heart regular rate and rhythm, no murmur Lungs: clear to auscultation bilaterally Abdomen: soft, obese, nontender, nondistended, no obvious ascites, no peritoneal signs, normal bowel sounds, no organomegaly Rectal: Omitted Extremities: no lower extremity edema bilaterally Skin: no lesions on visible extremities Neuro: No focal deficits. No asterixis.    ASSESSMENT:  #1. Chronic elevation of hepatic transaminases secondary to fatty liver. No evidence for hepatic synthetic dysfunction. Previous liver biopsy as described  #2. Morbid obesity #3. GERD #4. Screening colonoscopy 2015 with Dr. Laural Golden   PLAN:  #1. Long discussion today on fatty liver. Discussed NASH with progression to cirrhosis. #2. Weight loss #3. Exercise #4. Reflux precautions #5. Iron studies #6. No further GI workup plan. Resume care with Dr. Wolfgang Phoenix

## 2014-05-02 ENCOUNTER — Encounter: Payer: Self-pay | Admitting: Family Medicine

## 2014-05-02 DIAGNOSIS — Z79899 Other long term (current) drug therapy: Secondary | ICD-10-CM

## 2014-05-02 DIAGNOSIS — E039 Hypothyroidism, unspecified: Secondary | ICD-10-CM

## 2014-05-02 DIAGNOSIS — Z13220 Encounter for screening for lipoid disorders: Secondary | ICD-10-CM

## 2014-05-02 DIAGNOSIS — R7303 Prediabetes: Secondary | ICD-10-CM

## 2014-05-02 NOTE — Telephone Encounter (Signed)
Nurses #1 please let patient know that I received her message and I appreciated #2 let patient know I would like for her to do lab work before being seen including lipid, metabolic 7, hemoglobin Q2H, TSH, your micro-protein #3 patient to follow-up with office visit after lab work thank you

## 2014-05-03 NOTE — Addendum Note (Signed)
Addended by: Carmelina Noun on: 05/03/2014 08:25 AM   Modules accepted: Orders

## 2014-05-03 NOTE — Telephone Encounter (Signed)
Discussed with pt. Orders put in for labwork to be done at [facility].

## 2014-05-07 ENCOUNTER — Encounter: Payer: Self-pay | Admitting: Family Medicine

## 2014-05-07 DIAGNOSIS — R7303 Prediabetes: Secondary | ICD-10-CM

## 2014-05-07 DIAGNOSIS — Z79899 Other long term (current) drug therapy: Secondary | ICD-10-CM

## 2014-05-07 DIAGNOSIS — E039 Hypothyroidism, unspecified: Secondary | ICD-10-CM

## 2014-05-07 DIAGNOSIS — Z13220 Encounter for screening for lipoid disorders: Secondary | ICD-10-CM

## 2014-05-09 LAB — LIPID PANEL
Cholesterol: 153 mg/dL (ref 0–200)
HDL: 46 mg/dL (ref >39)
LDL Cholesterol: 83 mg/dL (ref 0–99)
TRIGLYCERIDES: 119 mg/dL (ref <150)
Total CHOL/HDL Ratio: 3.3 Ratio
VLDL: 24 mg/dL (ref 0–40)

## 2014-05-09 LAB — BASIC METABOLIC PANEL
BUN: 14 mg/dL (ref 6–23)
CO2: 25 meq/L (ref 19–32)
Calcium: 9.6 mg/dL (ref 8.4–10.5)
Chloride: 101 mEq/L (ref 96–112)
Creat: 0.67 mg/dL (ref 0.50–1.10)
Glucose, Bld: 120 mg/dL — ABNORMAL HIGH (ref 70–99)
Potassium: 3.6 mEq/L (ref 3.5–5.3)
SODIUM: 139 meq/L (ref 135–145)

## 2014-05-09 LAB — TSH: TSH: 8.87 u[IU]/mL — AB (ref 0.350–4.500)

## 2014-05-10 LAB — HEMOGLOBIN A1C
HEMOGLOBIN A1C: 6.1 % — AB (ref <5.7)
Mean Plasma Glucose: 128 mg/dL — ABNORMAL HIGH (ref <117)

## 2014-05-10 LAB — MICROALBUMIN, URINE: Microalb, Ur: 0.4 mg/dL (ref <2.0)

## 2014-05-13 ENCOUNTER — Ambulatory Visit: Payer: PPO | Admitting: Family Medicine

## 2014-05-14 ENCOUNTER — Encounter: Payer: Self-pay | Admitting: Family Medicine

## 2014-05-15 ENCOUNTER — Other Ambulatory Visit: Payer: Self-pay | Admitting: Family Medicine

## 2014-05-15 NOTE — Telephone Encounter (Signed)
Please figure out a way to rectify her problem. The patient does test herself daily. Therefore she needs at least 90 strips to do 90 days. It seems reasonable to me for her to get 2 containers of 50 strips apiece. Please call the patient let her know we will send in a new prescription she may have a years worth of refills on that issue.

## 2014-05-16 ENCOUNTER — Ambulatory Visit: Payer: PPO | Admitting: Family Medicine

## 2014-05-16 MED ORDER — IPRATROPIUM BROMIDE 0.02 % IN SOLN: RESPIRATORY_TRACT | Status: DC

## 2014-05-17 ENCOUNTER — Other Ambulatory Visit: Payer: Self-pay | Admitting: *Deleted

## 2014-05-20 ENCOUNTER — Ambulatory Visit: Payer: PPO | Admitting: Family Medicine

## 2014-05-22 ENCOUNTER — Ambulatory Visit (INDEPENDENT_AMBULATORY_CARE_PROVIDER_SITE_OTHER): Payer: PPO | Admitting: Family Medicine

## 2014-05-22 ENCOUNTER — Encounter: Payer: Self-pay | Admitting: Family Medicine

## 2014-05-22 VITALS — BP 138/88 | Ht 61.0 in | Wt 211.4 lb

## 2014-05-22 DIAGNOSIS — E038 Other specified hypothyroidism: Secondary | ICD-10-CM

## 2014-05-22 DIAGNOSIS — Z23 Encounter for immunization: Secondary | ICD-10-CM

## 2014-05-22 DIAGNOSIS — R7303 Prediabetes: Secondary | ICD-10-CM

## 2014-05-22 DIAGNOSIS — R7309 Other abnormal glucose: Secondary | ICD-10-CM

## 2014-05-22 DIAGNOSIS — I10 Essential (primary) hypertension: Secondary | ICD-10-CM

## 2014-05-22 NOTE — Progress Notes (Signed)
   Subjective:    Patient ID: Kristen Mack, female    DOB: 16-Aug-1945, 69 y.o.   MRN: [id number]  Diabetes She presents for her follow-up diabetic visit. She has type 2 diabetes mellitus. Pertinent negatives for hypoglycemia include no confusion. Associated symptoms include fatigue. Pertinent negatives for diabetes include no chest pain, no polydipsia, no polyphagia and no weakness. Her breakfast blood glucose range is generally 140-180 mg/dl. Her lunch blood glucose range is generally 110-130 mg/dl. She does not see a podiatrist.Eye exam is not current.  Blood sugars have been running high since having D and C on jan 8th. A1C  Done on bloodwork 6.1.   Concerns about blood pressure running high. Stopped taking effexor to see if that helped blood pressure.   Discuss TSH. Pt states it is higher on her bloodwork.   Discuss taking berberine.   Review of Systems  Constitutional: Positive for fatigue. Negative for fever, activity change and appetite change.  HENT: Negative for congestion.   Respiratory: Negative for cough.   Cardiovascular: Negative for chest pain.  Gastrointestinal: Negative for abdominal pain.  Endocrine: Negative for polydipsia and polyphagia.  Neurological: Negative for weakness.  Psychiatric/Behavioral: Negative for confusion.       Objective:   Physical Exam  Constitutional: She appears well-nourished. No distress.  Cardiovascular: Normal rate, regular rhythm and normal heart sounds.   No murmur heard. Pulmonary/Chest: Effort normal and breath sounds normal. No respiratory distress.  Musculoskeletal: She exhibits no edema.  Lymphadenopathy:    She has no cervical adenopathy.  Neurological: She is alert. She exhibits normal muscle tone.  Psychiatric: Her behavior is normal.  Vitals reviewed.         Assessment & Plan:  1. Other specified hypothyroidism Patient with significant fatigue. She was encouraged to continue medication check lab work await  results - TSH - T4, free  2. Prediabetes She has prediabetes watch starches and diet stay physically active try to reduce weight check A1c  3. HTN (hypertension), benign Blood pressure good control continue current measures watch salt, patient will work on it when she follows up in 3 months if the numbers are consistently staying good may need adjustments  4. Need for vaccination Pneumonia vaccine today - Pneumococcal conjugate vaccine 13-valent IM  Respiratory status stable

## 2014-05-23 ENCOUNTER — Other Ambulatory Visit: Payer: Self-pay | Admitting: Family Medicine

## 2014-06-02 ENCOUNTER — Telehealth: Payer: Self-pay | Admitting: Family Medicine

## 2014-06-02 NOTE — Telephone Encounter (Signed)
A form for her dental clinic was filled out. Please fax this to the dental clinic. Also finished putting her address on it phone number etc. Give patient courtesy phone call letting her know that this was sent thank you

## 2014-06-12 ENCOUNTER — Other Ambulatory Visit: Payer: Self-pay | Admitting: Family Medicine

## 2014-06-12 MED ORDER — LOSARTAN POTASSIUM 100 MG PO TABS: 100.0000 mg | ORAL_TABLET | Freq: Every day | ORAL | Status: DC

## 2014-06-14 ENCOUNTER — Encounter: Payer: Self-pay | Admitting: Family Medicine

## 2014-06-21 ENCOUNTER — Other Ambulatory Visit: Payer: Self-pay | Admitting: *Deleted

## 2014-06-21 DIAGNOSIS — E039 Hypothyroidism, unspecified: Secondary | ICD-10-CM

## 2014-06-21 LAB — T4, FREE: Free T4: 1.1 ng/dL (ref 0.80–1.80)

## 2014-06-21 LAB — TSH: TSH: 2.301 u[IU]/mL (ref 0.350–4.500)

## 2014-06-23 ENCOUNTER — Encounter: Payer: Self-pay | Admitting: Family Medicine

## 2014-06-24 ENCOUNTER — Encounter: Payer: Self-pay | Admitting: Family Medicine

## 2014-06-24 MED ORDER — LEVOTHYROXINE SODIUM 125 MCG PO TABS: ORAL_TABLET | ORAL | Status: DC

## 2014-06-27 ENCOUNTER — Encounter: Payer: Self-pay | Admitting: Family Medicine

## 2014-06-27 DIAGNOSIS — E039 Hypothyroidism, unspecified: Secondary | ICD-10-CM

## 2014-06-27 NOTE — Telephone Encounter (Signed)
Nurses,please go with 1 qd, change RX instructions in EPIC, send in new Rx if needed and let pt know, repeat TSH in 3 months- TSH and free T4

## 2014-06-27 NOTE — Telephone Encounter (Signed)
I don't see in documentation where you changed the directions of the levothyroxine.

## 2014-06-28 MED ORDER — LEVOTHYROXINE SODIUM 125 MCG PO TABS: 125.0000 ug | ORAL_TABLET | Freq: Every day | ORAL | Status: DC

## 2014-06-28 NOTE — Addendum Note (Signed)
Addended byCharolotte Capuchin D on: 06/28/2014 11:50 AM   Modules accepted: Orders

## 2014-06-28 NOTE — Addendum Note (Signed)
Addended byCharolotte Capuchin D on: 06/28/2014 10:24 AM   Modules accepted: Orders

## 2014-07-04 ENCOUNTER — Encounter: Payer: Self-pay | Admitting: Family Medicine

## 2014-07-04 MED ORDER — PENICILLIN V POTASSIUM 500 MG PO TABS: 500.0000 mg | ORAL_TABLET | Freq: Four times a day (QID) | ORAL | Status: DC

## 2014-08-06 ENCOUNTER — Other Ambulatory Visit: Payer: Self-pay | Admitting: Family Medicine

## 2014-09-11 ENCOUNTER — Encounter: Payer: Self-pay | Admitting: Family Medicine

## 2014-09-11 ENCOUNTER — Other Ambulatory Visit: Payer: Self-pay

## 2014-09-11 MED ORDER — GLUCOSE BLOOD VI STRP: ORAL_STRIP | Status: DC

## 2014-09-13 ENCOUNTER — Telehealth: Payer: Self-pay | Admitting: Family Medicine

## 2014-09-13 NOTE — Telephone Encounter (Signed)
Pt is requesting lab orders to be sent over. Last labs per epic were: tsh and t4 free on 06-28-14

## 2014-09-13 NOTE — Telephone Encounter (Signed)
We can do A1c in off that day

## 2014-09-13 NOTE — Telephone Encounter (Signed)
Pt made appt for July 7th. Prediabetic and concerned about her blood sugar. She states they have been running a little high.

## 2014-09-13 NOTE — Telephone Encounter (Signed)
Ask pt bw for what chronic visit no due til aug??

## 2014-09-13 NOTE — Telephone Encounter (Signed)
Discussed with pt

## 2014-10-02 ENCOUNTER — Ambulatory Visit (INDEPENDENT_AMBULATORY_CARE_PROVIDER_SITE_OTHER): Payer: PPO | Admitting: Family Medicine

## 2014-10-02 ENCOUNTER — Encounter: Payer: Self-pay | Admitting: Family Medicine

## 2014-10-02 VITALS — BP 132/90 | Ht 61.0 in | Wt 214.2 lb

## 2014-10-02 DIAGNOSIS — R7309 Other abnormal glucose: Secondary | ICD-10-CM | POA: Diagnosis not present

## 2014-10-02 DIAGNOSIS — E039 Hypothyroidism, unspecified: Secondary | ICD-10-CM | POA: Diagnosis not present

## 2014-10-02 DIAGNOSIS — I10 Essential (primary) hypertension: Secondary | ICD-10-CM

## 2014-10-02 DIAGNOSIS — R7303 Prediabetes: Secondary | ICD-10-CM

## 2014-10-02 LAB — TSH: TSH: 1.89 u[IU]/mL (ref 0.350–4.500)

## 2014-10-02 LAB — T4, FREE: Free T4: 1.12 ng/dL (ref 0.80–1.80)

## 2014-10-02 LAB — POCT GLYCOSYLATED HEMOGLOBIN (HGB A1C): HEMOGLOBIN A1C: 6.1

## 2014-10-02 NOTE — Progress Notes (Signed)
   Subjective:    Patient ID: Kristen Mack, female    DOB: 12-19-1945, 69 y.o.   MRN: [id number]  Diabetes She presents for her follow-up diabetic visit. She has type 2 diabetes mellitus. No MedicAlert identification noted. She is compliant with treatment all of the time. She has not had a previous visit with a dietitian. She does not see a podiatrist.Eye exam is not current.   Patient states no other concerns this visit. She is able to do some walking in the house she cannot tolerate the outdoor weather because of her lungs. She states she is try to minimize starches in her diet is trying to minimize what she is eating. She is try to lose weight time. She does relate some fatigue and tiredness. Her daughter is a Microbiologist and she is learning a lot through her.  Review of Systems Denies excessive thirst urination headaches chest pressure pain cough denies fever    Objective:   Physical Exam  Lungs are clear hearts regular pulse normal blood pressure taken in the forearm per patient's request blood pressure good 138/86 extremities no edema skin warm dry      Assessment & Plan:  prediabetes-the importance of watching starches discussed do not need to add medication currently. Patient frustrated by her weight. Frustrated by her fasting sugars being elevated. We talked about low starch diet we talked about regular activity exercise  Obesity patient watch diet try to stay physically active and lose weight  Hypertension good control continue current measures, blood pressure taken in the left forearm due to fibromyalgia and able to tolerate the bicep blood pressure  Hypothyroidism good control continue current measures, overall thyroid control is good continue current measures  Follow-up 3-4 months check A1c at that time, patient will need additional lab work to look at kidney function lipid and liver at that time she was encouraged to stick with her current medication regimen 25 minutes  spent with patient today.

## 2014-10-07 NOTE — Progress Notes (Signed)
As far as I can tell, the patient will in fact have to wait a year from her Prevnar 17.  I have contacted her insurance company to find out for sure, but still no response.  I will get back to you.

## 2014-10-29 ENCOUNTER — Other Ambulatory Visit: Payer: Self-pay | Admitting: Family Medicine

## 2014-11-05 ENCOUNTER — Other Ambulatory Visit: Payer: Self-pay | Admitting: Family Medicine

## 2014-11-06 ENCOUNTER — Encounter: Payer: Self-pay | Admitting: Family Medicine

## 2014-11-06 DIAGNOSIS — Z79899 Other long term (current) drug therapy: Secondary | ICD-10-CM

## 2014-11-06 MED ORDER — POTASSIUM CHLORIDE CRYS ER 20 MEQ PO TBCR: 20.0000 meq | EXTENDED_RELEASE_TABLET | Freq: Every day | ORAL | Status: DC

## 2014-11-06 NOTE — Addendum Note (Signed)
Addended by: Jesusita Oka on: 11/06/2014 02:17 PM   Modules accepted: Orders

## 2014-11-06 NOTE — Telephone Encounter (Signed)
Nurses please give 90 day supply 3 refills and send it in please thank you I would recommend lab work later this year to verify potassium looks good check metabolic 7 in 4 weeks

## 2014-12-02 ENCOUNTER — Other Ambulatory Visit: Payer: Self-pay | Admitting: Family Medicine

## 2015-01-28 ENCOUNTER — Other Ambulatory Visit: Payer: Self-pay | Admitting: Family Medicine

## 2015-02-04 ENCOUNTER — Ambulatory Visit: Payer: PPO | Admitting: Family Medicine

## 2015-02-21 ENCOUNTER — Other Ambulatory Visit: Payer: Self-pay | Admitting: Family Medicine

## 2015-02-21 DIAGNOSIS — Z1231 Encounter for screening mammogram for malignant neoplasm of breast: Secondary | ICD-10-CM

## 2015-02-27 ENCOUNTER — Inpatient Hospital Stay (HOSPITAL_COMMUNITY): Admission: RE | Admit: 2015-02-27 | Payer: PPO | Source: Ambulatory Visit

## 2015-03-03 ENCOUNTER — Other Ambulatory Visit: Payer: Self-pay | Admitting: Family Medicine

## 2015-03-25 ENCOUNTER — Other Ambulatory Visit: Payer: Self-pay | Admitting: *Deleted

## 2015-03-26 ENCOUNTER — Encounter: Payer: Self-pay | Admitting: Family Medicine

## 2015-03-26 NOTE — Telephone Encounter (Signed)
Nurse's-assist patient by setting up lab work. Please check hemoglobin R8V, TSH, metabolic 7, lipid, liver. Please put lab work into the system-the patient will be checking her messages thank you

## 2015-03-27 ENCOUNTER — Other Ambulatory Visit: Payer: Self-pay | Admitting: *Deleted

## 2015-03-27 DIAGNOSIS — E039 Hypothyroidism, unspecified: Secondary | ICD-10-CM

## 2015-03-27 DIAGNOSIS — R7303 Prediabetes: Secondary | ICD-10-CM

## 2015-03-27 DIAGNOSIS — E785 Hyperlipidemia, unspecified: Secondary | ICD-10-CM

## 2015-03-27 DIAGNOSIS — I10 Essential (primary) hypertension: Secondary | ICD-10-CM

## 2015-03-27 DIAGNOSIS — Z79899 Other long term (current) drug therapy: Secondary | ICD-10-CM

## 2015-03-28 ENCOUNTER — Other Ambulatory Visit: Payer: Self-pay | Admitting: *Deleted

## 2015-03-28 MED ORDER — IPRATROPIUM BROMIDE 0.02 % IN SOLN: RESPIRATORY_TRACT | Status: DC

## 2015-04-01 DIAGNOSIS — R7303 Prediabetes: Secondary | ICD-10-CM | POA: Diagnosis not present

## 2015-04-01 DIAGNOSIS — E785 Hyperlipidemia, unspecified: Secondary | ICD-10-CM | POA: Diagnosis not present

## 2015-04-01 DIAGNOSIS — E039 Hypothyroidism, unspecified: Secondary | ICD-10-CM | POA: Diagnosis not present

## 2015-04-01 DIAGNOSIS — I10 Essential (primary) hypertension: Secondary | ICD-10-CM | POA: Diagnosis not present

## 2015-04-01 DIAGNOSIS — Z79899 Other long term (current) drug therapy: Secondary | ICD-10-CM | POA: Diagnosis not present

## 2015-04-02 ENCOUNTER — Ambulatory Visit (INDEPENDENT_AMBULATORY_CARE_PROVIDER_SITE_OTHER): Payer: PPO | Admitting: Family Medicine

## 2015-04-02 ENCOUNTER — Encounter: Payer: Self-pay | Admitting: Family Medicine

## 2015-04-02 VITALS — Ht 61.0 in | Wt 212.6 lb

## 2015-04-02 DIAGNOSIS — K219 Gastro-esophageal reflux disease without esophagitis: Secondary | ICD-10-CM

## 2015-04-02 DIAGNOSIS — K760 Fatty (change of) liver, not elsewhere classified: Secondary | ICD-10-CM

## 2015-04-02 DIAGNOSIS — R740 Nonspecific elevation of levels of transaminase and lactic acid dehydrogenase [LDH]: Secondary | ICD-10-CM | POA: Diagnosis not present

## 2015-04-02 DIAGNOSIS — I10 Essential (primary) hypertension: Secondary | ICD-10-CM | POA: Diagnosis not present

## 2015-04-02 DIAGNOSIS — E038 Other specified hypothyroidism: Secondary | ICD-10-CM | POA: Diagnosis not present

## 2015-04-02 DIAGNOSIS — R7303 Prediabetes: Secondary | ICD-10-CM

## 2015-04-02 DIAGNOSIS — R7401 Elevation of levels of liver transaminase levels: Secondary | ICD-10-CM

## 2015-04-02 LAB — LIPID PANEL
Chol/HDL Ratio: 4.8 ratio units — ABNORMAL HIGH (ref 0.0–4.4)
Cholesterol, Total: 198 mg/dL (ref 100–199)
HDL: 41 mg/dL (ref >39)
LDL Calculated: 123 mg/dL — ABNORMAL HIGH (ref 0–99)
TRIGLYCERIDES: 169 mg/dL — AB (ref 0–149)
VLDL Cholesterol Cal: 34 mg/dL (ref 5–40)

## 2015-04-02 LAB — BASIC METABOLIC PANEL
BUN/Creatinine Ratio: 16 (ref 11–26)
BUN: 12 mg/dL (ref 8–27)
CALCIUM: 9.3 mg/dL (ref 8.7–10.3)
CHLORIDE: 102 mmol/L (ref 96–106)
CO2: 22 mmol/L (ref 18–29)
Creatinine, Ser: 0.73 mg/dL (ref 0.57–1.00)
GFR calc Af Amer: 96 mL/min/{1.73_m2} (ref >59)
GFR calc non Af Amer: 84 mL/min/{1.73_m2} (ref >59)
GLUCOSE: 140 mg/dL — AB (ref 65–99)
POTASSIUM: 3.8 mmol/L (ref 3.5–5.2)
Sodium: 143 mmol/L (ref 134–144)

## 2015-04-02 LAB — HEPATIC FUNCTION PANEL
ALK PHOS: 102 IU/L (ref 39–117)
ALT: 116 IU/L — AB (ref 0–32)
AST: 131 IU/L — ABNORMAL HIGH (ref 0–40)
Albumin: 4.1 g/dL (ref 3.5–4.8)
BILIRUBIN TOTAL: 0.5 mg/dL (ref 0.0–1.2)
BILIRUBIN, DIRECT: 0.12 mg/dL (ref 0.00–0.40)
Total Protein: 7.3 g/dL (ref 6.0–8.5)

## 2015-04-02 LAB — HEMOGLOBIN A1C
ESTIMATED AVERAGE GLUCOSE: 134 mg/dL
Hgb A1c MFr Bld: 6.3 % — ABNORMAL HIGH (ref 4.8–5.6)

## 2015-04-02 LAB — TSH: TSH: 0.647 u[IU]/mL (ref 0.450–4.500)

## 2015-04-02 MED ORDER — VERAPAMIL HCL ER 240 MG PO CP24: 240.0000 mg | ORAL_CAPSULE | Freq: Every day | ORAL | Status: DC

## 2015-04-02 NOTE — Progress Notes (Signed)
   Subjective:    Patient ID: Kristen Mack, female    DOB: Jan 30, 1946, 70 y.o.   MRN: [id number]  Hypertension This is a chronic problem. The current episode started more than 1 year ago. Pertinent negatives include no chest pain. Risk factors for coronary artery disease include diabetes mellitus, dyslipidemia, obesity and post-menopausal state. Treatments tried: lozol, cozaar. There are no compliance problems.    Patient would like to discuss recent labs. We went over her labs in detail including elevated liver enzymes see discussion below She relates she has not been doing a great job watching diet or being careful how she is eating. In addition to this she also states unable to been doing not as much exercise. Because of all this she has been gaining weight. There has been some social stresses as well. She does take her other medicines including blood pressure medicine and thyroid medicine. She does try to watch starches to some degree but she states she has not done is good of a job.  Review of Systems  Constitutional: Negative for activity change, appetite change and fatigue.  HENT: Negative for congestion.   Respiratory: Negative for cough.   Cardiovascular: Negative for chest pain.  Gastrointestinal: Negative for abdominal pain.  Endocrine: Negative for polydipsia and polyphagia.  Neurological: Negative for weakness.  Psychiatric/Behavioral: Negative for confusion.       Objective:   Physical Exam  Constitutional: She appears well-nourished. No distress.  Cardiovascular: Normal rate, regular rhythm and normal heart sounds.   No murmur heard. Pulmonary/Chest: Effort normal and breath sounds normal. No respiratory distress.  Musculoskeletal: She exhibits no edema.  Lymphadenopathy:    She has no cervical adenopathy.  Neurological: She is alert. She exhibits normal muscle tone.  Psychiatric: Her behavior is normal.  Vitals reviewed.   25 minutes was spent with the  patient. Greater than half the time was spent in discussion and answering questions and counseling regarding the issues that the patient came in for today.       Assessment & Plan:   hyperlipidemia-slight worsening patient states she will do a better job watching diet she also takes some over-the-counter supplements    hypothyroidism doing well on current dosing keep as is    Severe elevation of liver enzymes with fatty liver she will need a repeat ultrasound with elastrography   Patient also having additional blood work she will need to repeat lab work again in 3-4 months along with a repeat of an A1c in addition to this healthy diet try to lose weight   Blood pressure good control  Patient at risk of developing cirrhosis due to fatty liver importance of losing weight taking her medication discussed in detail in addition to this may need referral to gastroenterology.

## 2015-04-03 LAB — CERULOPLASMIN: Ceruloplasmin: 28.4 mg/dL (ref 19.0–39.0)

## 2015-04-03 LAB — FERRITIN: Ferritin: 145 ng/mL (ref 15–150)

## 2015-04-03 LAB — IRON: Iron: 84 ug/dL (ref 27–139)

## 2015-04-03 LAB — ANA: Anti Nuclear Antibody(ANA): NEGATIVE

## 2015-04-04 ENCOUNTER — Ambulatory Visit (HOSPITAL_COMMUNITY): Payer: PPO

## 2015-04-04 ENCOUNTER — Ambulatory Visit (HOSPITAL_COMMUNITY)
Admission: RE | Admit: 2015-04-04 | Discharge: 2015-04-04 | Disposition: A | Discharge status: Home / Self Care | Payer: PPO | Source: Ambulatory Visit | Attending: Family Medicine | Admitting: Family Medicine

## 2015-04-04 DIAGNOSIS — R740 Nonspecific elevation of levels of transaminase and lactic acid dehydrogenase [LDH]: Secondary | ICD-10-CM | POA: Insufficient documentation

## 2015-04-04 DIAGNOSIS — R945 Abnormal results of liver function studies: Secondary | ICD-10-CM | POA: Diagnosis not present

## 2015-04-07 NOTE — Addendum Note (Signed)
Addended by: Ofilia Neas R on: 04/07/2015 11:38 AM   Modules accepted: Orders

## 2015-04-16 ENCOUNTER — Encounter: Payer: Self-pay | Admitting: Family Medicine

## 2015-04-16 NOTE — Telephone Encounter (Signed)
Nurse's-please assist the patient. It would be perfectly fine to give her 3 month supply with a years refill of these particular strips. Typically Medicare and insurance companies will allow for once a day testing with individuals who are not on insulin.(If they are on insulin they will allow for up to 3. Times per day with testing strips) please discuss with the patient and allow her to get the proper prescription thank you

## 2015-04-22 ENCOUNTER — Encounter: Payer: Self-pay | Admitting: Family Medicine

## 2015-04-23 ENCOUNTER — Other Ambulatory Visit (INDEPENDENT_AMBULATORY_CARE_PROVIDER_SITE_OTHER): Payer: PPO

## 2015-04-23 ENCOUNTER — Ambulatory Visit (INDEPENDENT_AMBULATORY_CARE_PROVIDER_SITE_OTHER): Payer: PPO | Admitting: Internal Medicine

## 2015-04-23 ENCOUNTER — Encounter: Payer: Self-pay | Admitting: Internal Medicine

## 2015-04-23 VITALS — BP 138/78 | HR 84 | Ht 61.0 in | Wt 210.0 lb

## 2015-04-23 DIAGNOSIS — E669 Obesity, unspecified: Secondary | ICD-10-CM

## 2015-04-23 DIAGNOSIS — K760 Fatty (change of) liver, not elsewhere classified: Secondary | ICD-10-CM

## 2015-04-23 DIAGNOSIS — R945 Abnormal results of liver function studies: Secondary | ICD-10-CM

## 2015-04-23 DIAGNOSIS — K7400 Hepatic fibrosis, unspecified: Secondary | ICD-10-CM

## 2015-04-23 DIAGNOSIS — R7989 Other specified abnormal findings of blood chemistry: Secondary | ICD-10-CM

## 2015-04-23 LAB — CBC WITH DIFFERENTIAL/PLATELET
BASOS PCT: 0.9 % (ref 0.0–3.0)
Basophils Absolute: 0.1 10*3/uL (ref 0.0–0.1)
EOS PCT: 3.6 % (ref 0.0–5.0)
Eosinophils Absolute: 0.3 10*3/uL (ref 0.0–0.7)
HEMATOCRIT: 43.8 % (ref 36.0–46.0)
HEMOGLOBIN: 14.6 g/dL (ref 12.0–15.0)
Lymphocytes Relative: 23.7 % (ref 12.0–46.0)
Lymphs Abs: 1.6 10*3/uL (ref 0.7–4.0)
MCHC: 33.2 g/dL (ref 30.0–36.0)
MCV: 87.4 fl (ref 78.0–100.0)
MONO ABS: 0.6 10*3/uL (ref 0.1–1.0)
MONOS PCT: 8.9 % (ref 3.0–12.0)
Neutro Abs: 4.4 10*3/uL (ref 1.4–7.7)
Neutrophils Relative %: 62.9 % (ref 43.0–77.0)
Platelets: 437 10*3/uL — ABNORMAL HIGH (ref 150.0–400.0)
RBC: 5.01 Mil/uL (ref 3.87–5.11)
RDW: 13.9 % (ref 11.5–15.5)
WBC: 6.9 10*3/uL (ref 4.0–10.5)

## 2015-04-23 LAB — PROTIME-INR
INR: 1.2 ratio — AB (ref 0.8–1.0)
Prothrombin Time: 12.1 s (ref 9.6–13.1)

## 2015-04-23 MED ORDER — LEVOTHYROXINE SODIUM 125 MCG PO TABS: 125.0000 ug | ORAL_TABLET | Freq: Every day | ORAL | Status: DC

## 2015-04-23 MED ORDER — VERAPAMIL HCL ER 240 MG PO CP24: 240.0000 mg | ORAL_CAPSULE | Freq: Every day | ORAL | Status: DC

## 2015-04-23 NOTE — Patient Instructions (Addendum)
Your physician has requested that you go to the basement for the following lab work before leaving today: CBC, PT INR  Please follow up with Dr. Henrene Pastor in one year.

## 2015-04-23 NOTE — Progress Notes (Signed)
HISTORY OF PRESENT ILLNESS:  Kristen Mack is a 69 y.o. female with multiple significant medical problems as listed below who is sent by her primary care provider Dr. Sallee Lange regarding elevated liver tests and abnormal abdominal ultrasound with elastography. I saw the patient initially for comprehensive consultation regarding abnormal liver tests 08/29/2014. PLEASE SEE THAT DICTATION. She has had previous extensive laboratory workup and liver biopsy in [place]. Her diagnosis is fatty liver disease. Transaminases have generally been about 2 times the upper limit of normal with normal alkaline phosphatase and bilirubin. We discussed her disease with her and her daughter last year. Exercise and weight loss recommended. No routine follow-up planned. Blood work from 04/01/2015 has been reviewed today. AST 131, ALT 116, alkaline phosphatase 102, and total bilirubin 0.5. Normal albumin. Normal iron studies. Hemoglobin A1c 6.3. Negative ANA. Abdominal ultrasound with elastography was performed 04/02/2015. The gallbladder is surgically absent with normal common bile duct diameter. The liver is diffusely hyperechoic similar to previous exam. The spleen is normal. The Metavir fibrosis score calculated at [facility] + F4, with the risk of fibrosis high. Patient's review of systems is remarkable for fatigue. She does have some transient ankle swelling. She has not been able to lose weight stating fibromyalgia as the reason. Her BMI remains at approximately 40. She is accompanied today by 2 daughters. There has been no evidence for encephalopathy on questioning. No GI bleeding. No ascites. She brings with her a supplement purported to provide "liver health". I reviewed this at her request  REVIEW OF SYSTEMS:  All non-GI ROS negative except for fatigue, myalgias, arthritis  Past Medical History  Diagnosis Date  . GERD (gastroesophageal reflux disease)   . Asthma   . CFS (chronic fatigue syndrome)   .  Fibromyalgia   . Meniere disease   . IBS (irritable bowel syndrome)   . Osteopenia   . Hypertension   . History of TIA (transient ischemic attack)     per CT scan   . Hypothyroidism   . Prediabetes   . History of adenomatous polyp of colon   . PMB (postmenopausal bleeding)   . Fatty liver disease, nonalcoholic   . History of cardiac murmur     until age 67  . OSA (obstructive sleep apnea)     CPAP intolerate -- per study 2010  moderate osa--  STATES HAS RAISED HOB  . Arthritis   . Tooth loose     LOWER FRONT  . Cracked tooth     X2  RIGHT LOWER MOLAR AND LEFT UPPER MOLAR    Past Surgical History  Procedure Laterality Date  . Cesarean section  [place]  . Esophagogastroduodenoscopy  02-08-2003  . Colonoscopy N/A 04/19/2013    Procedure: COLONOSCOPY;  Surgeon: Rogene Houston, MD;  Location[facility];  Service: Endoscopy;  Laterality: N/A;    . Hysteroscopy w/d&c  11-14-2002    endometrial polypectomy  . Liver biopsy  01-29-2004    benign  . Cholecystectomy  1995    and  REPAIR UMBILICAL HERNIA  . Hysteroscopy w/d&c N/A 04/05/2014    Procedure: DILATATION AND CURETTAGE /HYSTEROSCOPY, ENDOMETRIAL POLYPECTOMY;  Surgeon: Sanjuana Kava, MD;  Location: [place];  Service: Gynecology;  Laterality: N/A;    Social History Kristen Mack  reports that she quit smoking about 46 years ago. Her smoking use included Cigarettes. She has a 1.5 pack-year smoking history. She has never used smokeless tobacco. She reports that  she does not drink alcohol or use illicit drugs.  family history includes Breast cancer in her maternal aunt and paternal aunt; Diabetes in her father and mother; Heart disease in her paternal grandfather; Hypertension in her father and mother; Liver disease in her sister; Ovarian cancer (age of onset: 6) in her mother; Rectal cancer (age of onset: 61) in her brother. There is no history of Pancreatic cancer or Esophageal cancer.  Allergies   Allergen Reactions  . Ambien [Zolpidem Tartrate] Other (See Comments)    Side effect "last a couple days"  . Codeine Itching and Nausea And Vomiting  . Demerol [Meperidine] Other (See Comments)    "messes up senses"  . Latex Other (See Comments)    "skin gets raw"  . Sulfa Antibiotics Other (See Comments)    Severe abd cramp  . Amlodipine Other (See Comments)    dizzy  . Celexa [Citalopram Hydrobromide] Other (See Comments)    Mouth sores       PHYSICAL EXAMINATION: Vital signs: BP 138/78 mmHg  Pulse 84  Ht 5' 1"  (1.549 m)  Wt 210 lb (95.255 kg)  BMI 39.70 kg/m2  Constitutional: Obese, unhealthy appearing but no acute distress Psychiatric: alert and oriented x3, cooperative Eyes: extraocular movements intact, anicteric, conjunctiva pink Mouth: oral pharynx moist, no lesions. The tongue hue is normal Neck: supple without thyromegaly Lymph: no lymphadenopathy Cardiovascular: heart regular rate and rhythm, no murmur Lungs: clear to auscultation bilaterally Abdomen: soft, obese, nontender, nondistended, no obvious ascites, no peritoneal signs, normal bowel sounds, no organomegaly Rectal: Omitted Extremities: no clubbing, cyanosis, or lower extremity edema bilaterally Skin: no lesions on visible extremities. No spider telangiectasia or abnormal abdominal venous pattern Neuro: No focal deficits. No asterixis.  ASSESSMENT:  #1. Fatty liver disease. Likely has NASH. No evidence for decompensated liver disease, obvious cirrhosis, or portal hypertension. At risk for progression to cirrhosis. We discussed this last year. I discussed this today #2. Elevated hepatic transaminases. Chronic. Secondary to fatty liver disease #3. Multiple medical problems #4. Last colonoscopy with Dr. Laural Golden 2015  PLAN:  #1. Discussion on fatty liver disease #2. Cannot advise her with regards to over-the-counter supplement for "liver health". No data #3. Discussion on cirrhosis. Discussed  complications of cirrhosis. They were concerned #4. Exercise #5. Weight loss #6. Obtain CBC with platelets (thrombocytopenia may suggest early portal hypertension) and PT/INR (regarding hepatic synthetic function). #7. Routine GI follow-up in 1 year #8. Ongoing general medical care with Dr. Sallee Lange  A copy of this consultation note has been sent to Dr. Sallee Lange  25 minutes has been spent face-to-face with the patient. Greater than 50% of the time use for counseling her and her daughters regarding her liver disease

## 2015-04-24 ENCOUNTER — Encounter: Payer: Self-pay | Admitting: Family Medicine

## 2015-04-24 DIAGNOSIS — K7581 Nonalcoholic steatohepatitis (NASH): Secondary | ICD-10-CM | POA: Insufficient documentation

## 2015-05-26 ENCOUNTER — Other Ambulatory Visit: Payer: Self-pay | Admitting: Family Medicine

## 2015-06-30 ENCOUNTER — Other Ambulatory Visit: Payer: Self-pay | Admitting: Family Medicine

## 2015-07-15 ENCOUNTER — Encounter: Payer: Self-pay | Admitting: Family Medicine

## 2015-07-15 NOTE — Telephone Encounter (Signed)
Nurse's-please order lipid profile and hemoglobin A1c. Patient needs to do these lab work fasting before her follow-up office visit on May 4-thanks Dr. Nicki Reaper

## 2015-07-16 ENCOUNTER — Other Ambulatory Visit: Payer: Self-pay | Admitting: *Deleted

## 2015-07-16 DIAGNOSIS — E785 Hyperlipidemia, unspecified: Secondary | ICD-10-CM

## 2015-07-16 DIAGNOSIS — R739 Hyperglycemia, unspecified: Secondary | ICD-10-CM

## 2015-07-16 NOTE — Telephone Encounter (Signed)
See previous message

## 2015-07-28 DIAGNOSIS — R739 Hyperglycemia, unspecified: Secondary | ICD-10-CM | POA: Diagnosis not present

## 2015-07-28 DIAGNOSIS — E785 Hyperlipidemia, unspecified: Secondary | ICD-10-CM | POA: Diagnosis not present

## 2015-07-29 LAB — LIPID PANEL
Chol/HDL Ratio: 3.8 ratio units (ref 0.0–4.4)
Cholesterol, Total: 166 mg/dL (ref 100–199)
HDL: 44 mg/dL (ref >39)
LDL CALC: 93 mg/dL (ref 0–99)
Triglycerides: 143 mg/dL (ref 0–149)
VLDL CHOLESTEROL CAL: 29 mg/dL (ref 5–40)

## 2015-07-29 LAB — HEMOGLOBIN A1C
ESTIMATED AVERAGE GLUCOSE: 134 mg/dL
HEMOGLOBIN A1C: 6.3 % — AB (ref 4.8–5.6)

## 2015-07-31 ENCOUNTER — Ambulatory Visit: Payer: PPO | Admitting: Family Medicine

## 2015-07-31 ENCOUNTER — Other Ambulatory Visit: Payer: Self-pay | Admitting: Family Medicine

## 2015-08-04 ENCOUNTER — Encounter: Payer: Self-pay | Admitting: Family Medicine

## 2015-08-04 DIAGNOSIS — K760 Fatty (change of) liver, not elsewhere classified: Secondary | ICD-10-CM

## 2015-08-04 NOTE — Addendum Note (Signed)
Addended by: Dairl Ponder on: 08/04/2015 03:07 PM   Modules accepted: Orders

## 2015-08-04 NOTE — Telephone Encounter (Signed)
It would be fine to go ahead and repeat liver function at your convenience-nurses will put in the order for the liver function that can be done through lab core. Reason-fatty liver

## 2015-08-14 DIAGNOSIS — K760 Fatty (change of) liver, not elsewhere classified: Secondary | ICD-10-CM | POA: Diagnosis not present

## 2015-08-15 LAB — HEPATIC FUNCTION PANEL
ALBUMIN: 4 g/dL (ref 3.5–4.8)
ALK PHOS: 86 IU/L (ref 39–117)
ALT: 63 IU/L — ABNORMAL HIGH (ref 0–32)
AST: 49 IU/L — ABNORMAL HIGH (ref 0–40)
BILIRUBIN, DIRECT: 0.14 mg/dL (ref 0.00–0.40)
Bilirubin Total: 0.6 mg/dL (ref 0.0–1.2)
TOTAL PROTEIN: 7.1 g/dL (ref 6.0–8.5)

## 2015-08-20 ENCOUNTER — Ambulatory Visit (INDEPENDENT_AMBULATORY_CARE_PROVIDER_SITE_OTHER): Payer: PPO | Admitting: Family Medicine

## 2015-08-20 ENCOUNTER — Encounter: Payer: Self-pay | Admitting: Family Medicine

## 2015-08-20 VITALS — BP 132/82 | Ht 61.0 in | Wt 202.6 lb

## 2015-08-20 DIAGNOSIS — R0600 Dyspnea, unspecified: Secondary | ICD-10-CM | POA: Diagnosis not present

## 2015-08-20 DIAGNOSIS — E038 Other specified hypothyroidism: Secondary | ICD-10-CM

## 2015-08-20 DIAGNOSIS — K760 Fatty (change of) liver, not elsewhere classified: Secondary | ICD-10-CM | POA: Diagnosis not present

## 2015-08-20 DIAGNOSIS — M8580 Other specified disorders of bone density and structure, unspecified site: Secondary | ICD-10-CM | POA: Diagnosis not present

## 2015-08-20 DIAGNOSIS — R7303 Prediabetes: Secondary | ICD-10-CM | POA: Diagnosis not present

## 2015-08-20 DIAGNOSIS — I10 Essential (primary) hypertension: Secondary | ICD-10-CM

## 2015-08-20 NOTE — Progress Notes (Signed)
   Subjective:    Patient ID: Kristen Mack, female    DOB: September 26, 1945, 70 y.o.   MRN: [id number]  Hypertension This is a chronic problem. The current episode started more than 1 year ago. Risk factors for coronary artery disease include obesity and post-menopausal state. Treatments tried: cozaar, verapamil. There are no compliance problems.    Discuss blood work States has some fatigue and tiredness Tries watch starches in her diet to try to keep diabetes under control recent lab work reviewed Has fatty liver is seen GI doctor who told her she had fatty liver with hepatitis recommended doing the best he can at losing weight Patient is trying to watch her diet lose weight She is taken her thyroid medicine recent labs reviewed He does have osteopenia she will need a bone density later this year 25 minutes was spent with the patient. Greater than half the time was spent in discussion and answering questions and counseling regarding the issues that the patient came in for today. Family is concerned about some shortness of breath with increased humidity. Review of Systems Patient denies any chest tightness pressure pain shortness breath nausea vomiting diarrhea denies high fever chills sweats    Objective:   Physical Exam Neck no masses lungs clear no crackles heart regular pulse normal BP good abdomen soft obese soft no tenderness extremities no edema       Assessment & Plan:  HTN good control continue current measures watch diet continue try to lose weight Morbid obesity patient is lost 12 pounds she needs to continue to lose weight Fatty liver concerning that this can turn into cirrhosis. Enzymes are better recheck this again in a proximally 6 months watch diet stay physically active try to lose weight Patient is taking some herbal medicines which could be helping it is hard to say Prediabetes decent control no medications needed to be added but importance of watching starches in  diet discussed Hypothyroidism continue current measures recent lab work reviewed continue medication Osteopenia patient will be do bone density in November we will set it up and bring the patient in for follow-up including lab work Intermittent dyspnea and shortness of breath O2 sats ration 97% check pulmonary function pre-imposed patient does have history of asthma.

## 2015-09-02 ENCOUNTER — Other Ambulatory Visit: Payer: Self-pay | Admitting: Family Medicine

## 2015-09-02 ENCOUNTER — Other Ambulatory Visit: Payer: Self-pay | Admitting: *Deleted

## 2015-09-02 MED ORDER — ALBUTEROL SULFATE HFA 108 (90 BASE) MCG/ACT IN AERS: INHALATION_SPRAY | RESPIRATORY_TRACT | Status: DC

## 2015-09-03 ENCOUNTER — Ambulatory Visit (HOSPITAL_COMMUNITY)
Admission: RE | Admit: 2015-09-03 | Discharge: 2015-09-03 | Disposition: A | Discharge status: Home / Self Care | Payer: PPO | Source: Ambulatory Visit | Attending: Family Medicine | Admitting: Family Medicine

## 2015-09-03 DIAGNOSIS — R0600 Dyspnea, unspecified: Secondary | ICD-10-CM | POA: Diagnosis not present

## 2015-09-03 LAB — PULMONARY FUNCTION TEST
DL/VA % pred: 98 %
DL/VA: 4.34 ml/min/mmHg/L
DLCO UNC % PRED: 81 %
DLCO UNC: 16.41 ml/min/mmHg
FEF 25-75 Post: 2.58 L/sec
FEF 25-75 Pre: 1.46 L/sec
FEF2575-%CHANGE-POST: 76 %
FEF2575-%Pred-Post: 154 %
FEF2575-%Pred-Pre: 87 %
FEV1-%CHANGE-POST: 10 %
FEV1-%PRED-POST: 102 %
FEV1-%Pred-Pre: 92 %
FEV1-POST: 2 L
FEV1-Pre: 1.81 L
FEV1FVC-%CHANGE-POST: 14 %
FEV1FVC-%Pred-Pre: 100 %
FEV6-%Change-Post: -2 %
FEV6-%PRED-PRE: 94 %
FEV6-%Pred-Post: 92 %
FEV6-POST: 2.28 L
FEV6-PRE: 2.34 L
FEV6FVC-%Change-Post: 1 %
FEV6FVC-%PRED-POST: 105 %
FEV6FVC-%PRED-PRE: 103 %
FVC-%CHANGE-POST: -3 %
FVC-%PRED-PRE: 91 %
FVC-%Pred-Post: 88 %
FVC-POST: 2.28 L
FVC-PRE: 2.37 L
POST FEV6/FVC RATIO: 100 %
PRE FEV6/FVC RATIO: 99 %
Post FEV1/FVC ratio: 87 %
Pre FEV1/FVC ratio: 76 %
RV % PRED: 99 %
RV: 2.05 L
TLC % PRED: 95 %
TLC: 4.38 L

## 2015-09-03 MED ORDER — ALBUTEROL SULFATE (2.5 MG/3ML) 0.083% IN NEBU
2.5000 mg | INHALATION_SOLUTION | Freq: Once | RESPIRATORY_TRACT | Status: AC
  Administered 2015-09-03: 2.5 mg via RESPIRATORY_TRACT

## 2015-09-27 ENCOUNTER — Other Ambulatory Visit: Payer: Self-pay | Admitting: Family Medicine

## 2015-10-22 ENCOUNTER — Inpatient Hospital Stay (HOSPITAL_COMMUNITY): Admission: RE | Admit: 2015-10-22 | Payer: PPO | Source: Ambulatory Visit

## 2015-10-27 ENCOUNTER — Ambulatory Visit (HOSPITAL_COMMUNITY): Payer: PPO

## 2015-10-30 ENCOUNTER — Ambulatory Visit (HOSPITAL_COMMUNITY)
Admission: RE | Admit: 2015-10-30 | Discharge: 2015-10-30 | Disposition: A | Discharge status: Home / Self Care | Payer: PPO | Source: Ambulatory Visit | Attending: Family Medicine | Admitting: Family Medicine

## 2015-10-30 ENCOUNTER — Ambulatory Visit (HOSPITAL_COMMUNITY): Payer: PPO

## 2015-10-30 ENCOUNTER — Other Ambulatory Visit: Payer: Self-pay | Admitting: Family Medicine

## 2015-10-30 DIAGNOSIS — Z1231 Encounter for screening mammogram for malignant neoplasm of breast: Secondary | ICD-10-CM | POA: Diagnosis not present

## 2015-11-03 ENCOUNTER — Other Ambulatory Visit: Payer: Self-pay | Admitting: Family Medicine

## 2015-11-05 DIAGNOSIS — N393 Stress incontinence (female) (male): Secondary | ICD-10-CM | POA: Insufficient documentation

## 2015-11-05 DIAGNOSIS — Z01419 Encounter for gynecological examination (general) (routine) without abnormal findings: Secondary | ICD-10-CM | POA: Diagnosis not present

## 2015-11-27 DIAGNOSIS — H538 Other visual disturbances: Secondary | ICD-10-CM | POA: Diagnosis not present

## 2015-11-27 DIAGNOSIS — H2513 Age-related nuclear cataract, bilateral: Secondary | ICD-10-CM | POA: Diagnosis not present

## 2015-11-27 LAB — HM DIABETES EYE EXAM

## 2015-12-03 ENCOUNTER — Encounter: Payer: Self-pay | Admitting: *Deleted

## 2015-12-04 ENCOUNTER — Other Ambulatory Visit: Payer: Self-pay | Admitting: Family Medicine

## 2015-12-09 DIAGNOSIS — R32 Unspecified urinary incontinence: Secondary | ICD-10-CM | POA: Diagnosis not present

## 2015-12-22 ENCOUNTER — Encounter: Payer: Self-pay | Admitting: Family Medicine

## 2015-12-24 ENCOUNTER — Ambulatory Visit (INDEPENDENT_AMBULATORY_CARE_PROVIDER_SITE_OTHER): Payer: PPO | Admitting: Family Medicine

## 2015-12-24 ENCOUNTER — Encounter: Payer: Self-pay | Admitting: Family Medicine

## 2015-12-24 VITALS — BP 104/76 | Temp 97.9°F | Ht 61.0 in | Wt 204.2 lb

## 2015-12-24 DIAGNOSIS — J329 Chronic sinusitis, unspecified: Secondary | ICD-10-CM

## 2015-12-24 MED ORDER — CEFPROZIL 500 MG PO TABS: 500.0000 mg | ORAL_TABLET | Freq: Two times a day (BID) | ORAL | 0 refills | Status: DC

## 2015-12-24 NOTE — Progress Notes (Signed)
   Subjective:    Patient ID: Kristen Mack, female    DOB: 1946/01/10, 70 y.o.   MRN: [id number]  Otalgia   There is pain in both ears. This is a new problem. The current episode started 1 to 4 weeks ago. The problem has been unchanged. There has been no fever. The patient is experiencing no pain. Treatments tried: Vitamin C, Robitussin, Tumeric. The treatment provided no relief.   Patient states she has no other concerns at this time.   Upper resp infxn that settled in four wks ago, gunky at times Started last wed, bad headache  Severe pain low back  Walking and hcing with the back   Sneezing coughin gcong coughin and sneeain not a lot  Still achey    Low gr fever    gfigured viral plus allergies  alavert d plus flonase has not hellped  Review of Systems  HENT: Positive for ear pain.        Objective:   Physical Exam Alert, mild malaise. Hydration good Vitals stable. frontal/ maxillary tenderness evident positive nasal congestion. pharynx normal neck supple  lungs clear/no crackles or wheezes. heart regular in rhythm        Assessment & Plan:  Impression rhinosinusitis likely post viral, discussed with patient. plan antibiotics prescribed. Questions answered. Symptomatic care discussed. warning signs discussed. WSL

## 2015-12-29 ENCOUNTER — Ambulatory Visit (INDEPENDENT_AMBULATORY_CARE_PROVIDER_SITE_OTHER): Payer: PPO | Admitting: Family Medicine

## 2015-12-29 VITALS — BP 122/84 | Temp 97.8°F | Wt 204.0 lb

## 2015-12-29 DIAGNOSIS — H6980 Other specified disorders of Eustachian tube, unspecified ear: Secondary | ICD-10-CM

## 2015-12-29 DIAGNOSIS — H6990 Unspecified Eustachian tube disorder, unspecified ear: Secondary | ICD-10-CM

## 2015-12-29 DIAGNOSIS — J329 Chronic sinusitis, unspecified: Secondary | ICD-10-CM

## 2015-12-29 MED ORDER — AMOXICILLIN-POT CLAVULANATE 875-125 MG PO TABS: 1.0000 | ORAL_TABLET | Freq: Two times a day (BID) | ORAL | 0 refills | Status: DC

## 2015-12-29 NOTE — Progress Notes (Signed)
This patient presents with head congestion drainage sinus pressure ear pressure that's been present over the past week was seen about a week ago placed on antibiotics no better. Relates sinus pressure pain discomfort denies high fever chills sweats. His tried decongestants nasal sprays and allergy medicines along with the antibiotic without any success PMH noted. Exam moderate time sinus tenderness eardrums normal throat is normal neck no masses lungs clear no crackles heart regular A/P allergic rhinitis continue allergy medicine Sinusitis antibiotics prescribed warning signs discussed follow-up if problems

## 2016-01-06 DIAGNOSIS — N393 Stress incontinence (female) (male): Secondary | ICD-10-CM | POA: Diagnosis not present

## 2016-01-12 ENCOUNTER — Ambulatory Visit (INDEPENDENT_AMBULATORY_CARE_PROVIDER_SITE_OTHER): Payer: PPO | Admitting: Family Medicine

## 2016-01-12 ENCOUNTER — Ambulatory Visit: Payer: PPO | Admitting: Family Medicine

## 2016-01-12 ENCOUNTER — Encounter: Payer: Self-pay | Admitting: Family Medicine

## 2016-01-12 VITALS — Temp 97.7°F | Ht 61.0 in | Wt 204.0 lb

## 2016-01-12 DIAGNOSIS — H6502 Acute serous otitis media, left ear: Secondary | ICD-10-CM

## 2016-01-12 NOTE — Progress Notes (Signed)
   Subjective:    Patient ID: Kristen Mack, female    DOB: 27-Jun-1945, 70 y.o.   MRN: [id number]  Otalgia   This is a new problem. The current episode started 1 to 4 weeks ago. The problem has been unchanged. The pain is moderate. She has tried antibiotics for the symptoms. The treatment provided no relief.  She feels like she can't hear out of her left ear she denies any sinus pressure pain discomfort wheezing difficulty breathing no high fevers chills or sweats has previous trouble with her ears previous tubes and years    Review of Systems  HENT: Positive for ear pain.    See above    Objective:   Physical Exam Left eardrum with serous fluid right eardrum normal throat normal neck supple lungs clear       Assessment & Plan:  Serous otitis patient relates been present for 6-8 weeks not getting any better. I recommend that this patient be seen by ENT probable will need tube placement. Patient is asked we try to urgently get her in because the symptoms are worsening and bothering her

## 2016-01-13 NOTE — Addendum Note (Signed)
Addended by: Carmelina Noun on: 01/13/2016 09:21 AM   Modules accepted: Orders

## 2016-02-02 ENCOUNTER — Ambulatory Visit (INDEPENDENT_AMBULATORY_CARE_PROVIDER_SITE_OTHER): Payer: PPO | Admitting: Otolaryngology

## 2016-02-02 DIAGNOSIS — H903 Sensorineural hearing loss, bilateral: Secondary | ICD-10-CM

## 2016-02-02 DIAGNOSIS — J310 Chronic rhinitis: Secondary | ICD-10-CM

## 2016-02-02 DIAGNOSIS — H9012 Conductive hearing loss, unilateral, left ear, with unrestricted hearing on the contralateral side: Secondary | ICD-10-CM

## 2016-02-02 DIAGNOSIS — J342 Deviated nasal septum: Secondary | ICD-10-CM | POA: Diagnosis not present

## 2016-02-02 DIAGNOSIS — J343 Hypertrophy of nasal turbinates: Secondary | ICD-10-CM | POA: Diagnosis not present

## 2016-02-02 DIAGNOSIS — H6983 Other specified disorders of Eustachian tube, bilateral: Secondary | ICD-10-CM | POA: Diagnosis not present

## 2016-02-03 ENCOUNTER — Other Ambulatory Visit: Payer: Self-pay | Admitting: Obstetrics & Gynecology

## 2016-02-03 DIAGNOSIS — N898 Other specified noninflammatory disorders of vagina: Secondary | ICD-10-CM | POA: Diagnosis not present

## 2016-02-03 DIAGNOSIS — R300 Dysuria: Secondary | ICD-10-CM | POA: Diagnosis not present

## 2016-02-06 DIAGNOSIS — H6522 Chronic serous otitis media, left ear: Secondary | ICD-10-CM | POA: Diagnosis not present

## 2016-02-06 DIAGNOSIS — H9012 Conductive hearing loss, unilateral, left ear, with unrestricted hearing on the contralateral side: Secondary | ICD-10-CM | POA: Diagnosis not present

## 2016-02-06 DIAGNOSIS — H6982 Other specified disorders of Eustachian tube, left ear: Secondary | ICD-10-CM | POA: Diagnosis not present

## 2016-02-11 ENCOUNTER — Ambulatory Visit: Payer: PPO | Admitting: Family Medicine

## 2016-02-24 ENCOUNTER — Encounter: Payer: Self-pay | Admitting: Family Medicine

## 2016-02-24 DIAGNOSIS — K760 Fatty (change of) liver, not elsewhere classified: Secondary | ICD-10-CM

## 2016-02-24 DIAGNOSIS — Z79899 Other long term (current) drug therapy: Secondary | ICD-10-CM

## 2016-02-24 DIAGNOSIS — I10 Essential (primary) hypertension: Secondary | ICD-10-CM

## 2016-02-24 DIAGNOSIS — R7303 Prediabetes: Secondary | ICD-10-CM

## 2016-02-25 ENCOUNTER — Other Ambulatory Visit: Payer: Self-pay | Admitting: *Deleted

## 2016-02-25 DIAGNOSIS — M8580 Other specified disorders of bone density and structure, unspecified site: Secondary | ICD-10-CM

## 2016-02-25 DIAGNOSIS — Z780 Asymptomatic menopausal state: Secondary | ICD-10-CM

## 2016-02-25 NOTE — Telephone Encounter (Signed)
Pt wants to schedule own bone density test. Order put in. Pt wants to know if she also needs bloodwork besides the micro-protein urine. She states she usually gets a liver test, glucose and A1C

## 2016-02-25 NOTE — Telephone Encounter (Signed)
Nurses set up your micro-protein through lab core #2 set up bone density as requested

## 2016-02-25 NOTE — Telephone Encounter (Signed)
Please order lipid, liver, hemoglobin S9G, metabolic 7

## 2016-02-26 NOTE — Addendum Note (Signed)
Addended by: Ofilia Neas R on: 02/26/2016 08:09 AM   Modules accepted: Orders

## 2016-02-28 ENCOUNTER — Other Ambulatory Visit: Payer: Self-pay | Admitting: Family Medicine

## 2016-02-28 DIAGNOSIS — Z79899 Other long term (current) drug therapy: Secondary | ICD-10-CM | POA: Diagnosis not present

## 2016-02-28 DIAGNOSIS — I10 Essential (primary) hypertension: Secondary | ICD-10-CM | POA: Diagnosis not present

## 2016-02-28 DIAGNOSIS — R7303 Prediabetes: Secondary | ICD-10-CM | POA: Diagnosis not present

## 2016-02-28 DIAGNOSIS — K760 Fatty (change of) liver, not elsewhere classified: Secondary | ICD-10-CM | POA: Diagnosis not present

## 2016-02-28 DIAGNOSIS — E039 Hypothyroidism, unspecified: Secondary | ICD-10-CM | POA: Diagnosis not present

## 2016-02-29 LAB — BASIC METABOLIC PANEL
BUN/Creatinine Ratio: 18 (ref 12–28)
BUN: 12 mg/dL (ref 8–27)
CALCIUM: 9.5 mg/dL (ref 8.7–10.3)
CO2: 23 mmol/L (ref 18–29)
CREATININE: 0.68 mg/dL (ref 0.57–1.00)
Chloride: 104 mmol/L (ref 96–106)
GFR calc Af Amer: 102 mL/min/{1.73_m2} (ref >59)
GFR, EST NON AFRICAN AMERICAN: 88 mL/min/{1.73_m2} (ref >59)
Glucose: 133 mg/dL — ABNORMAL HIGH (ref 65–99)
Potassium: 3.9 mmol/L (ref 3.5–5.2)
Sodium: 143 mmol/L (ref 134–144)

## 2016-02-29 LAB — LIPID PANEL
CHOL/HDL RATIO: 4.7 ratio — AB (ref 0.0–4.4)
Cholesterol, Total: 198 mg/dL (ref 100–199)
HDL: 42 mg/dL (ref >39)
LDL Calculated: 122 mg/dL — ABNORMAL HIGH (ref 0–99)
Triglycerides: 168 mg/dL — ABNORMAL HIGH (ref 0–149)
VLDL Cholesterol Cal: 34 mg/dL (ref 5–40)

## 2016-02-29 LAB — HEMOGLOBIN A1C
Est. average glucose Bld gHb Est-mCnc: 126 mg/dL
Hgb A1c MFr Bld: 6 % — ABNORMAL HIGH (ref 4.8–5.6)

## 2016-02-29 LAB — HEPATIC FUNCTION PANEL
ALT: 57 IU/L — ABNORMAL HIGH (ref 0–32)
AST: 49 IU/L — ABNORMAL HIGH (ref 0–40)
Albumin: 4 g/dL (ref 3.5–4.8)
Alkaline Phosphatase: 79 IU/L (ref 39–117)
BILIRUBIN TOTAL: 0.4 mg/dL (ref 0.0–1.2)
BILIRUBIN, DIRECT: 0.11 mg/dL (ref 0.00–0.40)
TOTAL PROTEIN: 7.3 g/dL (ref 6.0–8.5)

## 2016-02-29 LAB — MICROALBUMIN / CREATININE URINE RATIO
Creatinine, Urine: 105.9 mg/dL
MICROALB/CREAT RATIO: 35.6 mg/g{creat} — AB (ref 0.0–30.0)
MICROALBUM., U, RANDOM: 37.7 ug/mL

## 2016-03-02 ENCOUNTER — Ambulatory Visit (INDEPENDENT_AMBULATORY_CARE_PROVIDER_SITE_OTHER): Payer: PPO | Admitting: Family Medicine

## 2016-03-02 ENCOUNTER — Encounter: Payer: Self-pay | Admitting: Family Medicine

## 2016-03-02 VITALS — BP 132/88 | Ht 61.0 in | Wt 202.0 lb

## 2016-03-02 DIAGNOSIS — R7303 Prediabetes: Secondary | ICD-10-CM

## 2016-03-02 DIAGNOSIS — K760 Fatty (change of) liver, not elsewhere classified: Secondary | ICD-10-CM | POA: Diagnosis not present

## 2016-03-02 DIAGNOSIS — R740 Nonspecific elevation of levels of transaminase and lactic acid dehydrogenase [LDH]: Secondary | ICD-10-CM | POA: Diagnosis not present

## 2016-03-02 DIAGNOSIS — E038 Other specified hypothyroidism: Secondary | ICD-10-CM

## 2016-03-02 DIAGNOSIS — R7401 Elevation of levels of liver transaminase levels: Secondary | ICD-10-CM

## 2016-03-02 DIAGNOSIS — I10 Essential (primary) hypertension: Secondary | ICD-10-CM

## 2016-03-02 DIAGNOSIS — K7581 Nonalcoholic steatohepatitis (NASH): Secondary | ICD-10-CM

## 2016-03-02 DIAGNOSIS — F331 Major depressive disorder, recurrent, moderate: Secondary | ICD-10-CM | POA: Diagnosis not present

## 2016-03-02 DIAGNOSIS — Z23 Encounter for immunization: Secondary | ICD-10-CM

## 2016-03-02 MED ORDER — SERTRALINE HCL 50 MG PO TABS: 50.0000 mg | ORAL_TABLET | Freq: Every day | ORAL | 4 refills | Status: DC

## 2016-03-02 NOTE — Progress Notes (Signed)
   Subjective:    Patient ID: Kristen Mack, female    DOB: 25-Sep-1945, 70 y.o.   MRN: [id number]  Hypertension  This is a chronic problem. Pertinent negatives include no chest pain, headaches or shortness of breath.  takes her blood pressure medicine regularly she dnies dietary problems. Pain in right hip, leg and thigh. Started 3 months ago.   him lab wok was discussed with patient in detail including her F8H metabolic  And liver profile CBC liver enzymes Wants left checked to see if tube is still in place.   she relates a lot of pressure in her left ear. Fatigue.patiet states she akes her thyroid medicine on a regular basis   Allergic reactions since august. This happened mainly at the part classes she is taking have an exposure to dust  Wants to discuss starting back on anti depressant.  She relates problems with feeling depressed and low nergy.  Review of Systems  Constitutional: Positive for fatigue. Negative for activity change, chills and fever.  HENT: Negative for congestion.   Respiratory: Negative for cough and shortness of breath.   Cardiovascular: Negative for chest pain and leg swelling.  Gastrointestinal: Negative for abdominal pain.  Musculoskeletal: Positive for arthralgias.  Neurological: Negative for headaches.  Psychiatric/Behavioral: Negative for confusion.       Objective:   Physical Exam  Constitutional: She appears well-nourished. No distress.  Cardiovascular: Normal rate, regular rhythm and normal heart sounds.   No murmur heard. Pulmonary/Chest: Effort normal and breath sounds normal. No respiratory distress.  Musculoskeletal: She exhibits no edema.  Lymphadenopathy:    She has no cervical adenopathy.  Neurological: She is alert. She exhibits normal muscle tone.  Psychiatric: Her behavior is normal.  Vitals reviewed.         Assessment & Plan:  1. Need for vaccination  today - Flu Vaccine QUAD 36+ mos PF IM (Fluarix & Fluzone Quad  PF)  2. HTN (hypertension), benign  good control continue current measures  3. NASH (nonalcoholic steatohepatitis)  liver functions look better watch diet lose weight  4. Fatty liver  liver functions better watch diet lose weight  5. Other specified hypothyroidism  check TSH awat esults  6. Elevated transaminase level  liver enzmeslook better  7. Moderate episode of recurrent major depressive disorder ([place])  start Zoloft recheck in 4-6 weeks patient not suicidal  8. Prediabetes  A1c much better. 25 minutes was spent with the patient. Greater than half the time was spent in discussion and answering questions and counseling regarding the issues that the patient came in for today.

## 2016-03-03 LAB — SPECIMEN STATUS REPORT

## 2016-03-03 NOTE — Progress Notes (Signed)
Lab added on yesterday 03/03/2016

## 2016-03-04 ENCOUNTER — Encounter: Payer: Self-pay | Admitting: Family Medicine

## 2016-03-04 ENCOUNTER — Other Ambulatory Visit: Payer: Self-pay | Admitting: Family Medicine

## 2016-03-06 LAB — SPECIMEN STATUS REPORT

## 2016-03-06 LAB — TSH: TSH: 2.56 u[IU]/mL (ref 0.450–4.500)

## 2016-03-08 ENCOUNTER — Other Ambulatory Visit: Payer: Self-pay | Admitting: *Deleted

## 2016-03-08 MED ORDER — MONTELUKAST SODIUM 10 MG PO TABS: 10.0000 mg | ORAL_TABLET | Freq: Every day | ORAL | 5 refills | Status: DC

## 2016-03-08 MED ORDER — ALBUTEROL SULFATE HFA 108 (90 BASE) MCG/ACT IN AERS
INHALATION_SPRAY | RESPIRATORY_TRACT | 5 refills | Status: DC
Start: 2016-03-08 — End: 2016-04-20

## 2016-03-31 ENCOUNTER — Ambulatory Visit: Payer: PPO | Admitting: Family Medicine

## 2016-04-05 ENCOUNTER — Other Ambulatory Visit: Payer: Self-pay | Admitting: Family Medicine

## 2016-04-20 ENCOUNTER — Telehealth: Payer: Self-pay | Admitting: Pediatrics

## 2016-04-20 MED ORDER — ALBUTEROL SULFATE HFA 108 (90 BASE) MCG/ACT IN AERS: INHALATION_SPRAY | RESPIRATORY_TRACT | 5 refills | Status: DC

## 2016-04-20 NOTE — Telephone Encounter (Signed)
Pt's ins co faxed notification that they will not cover name brand Proventil.  I resent rx for albuterol-whatever brand was in stock.

## 2016-04-22 ENCOUNTER — Ambulatory Visit (INDEPENDENT_AMBULATORY_CARE_PROVIDER_SITE_OTHER): Payer: PPO | Admitting: Otolaryngology

## 2016-04-22 DIAGNOSIS — J310 Chronic rhinitis: Secondary | ICD-10-CM | POA: Diagnosis not present

## 2016-04-22 DIAGNOSIS — J0101 Acute recurrent maxillary sinusitis: Secondary | ICD-10-CM

## 2016-04-22 DIAGNOSIS — J343 Hypertrophy of nasal turbinates: Secondary | ICD-10-CM

## 2016-04-22 DIAGNOSIS — H7202 Central perforation of tympanic membrane, left ear: Secondary | ICD-10-CM

## 2016-05-05 ENCOUNTER — Other Ambulatory Visit: Payer: Self-pay | Admitting: Family Medicine

## 2016-05-06 ENCOUNTER — Encounter: Payer: Self-pay | Admitting: Family Medicine

## 2016-05-06 ENCOUNTER — Telehealth: Payer: Self-pay | Admitting: Family Medicine

## 2016-05-06 NOTE — Telephone Encounter (Signed)
This is beyond my level of expertise this requires both nursing and administrative input in order to get this to happen if there are forms I need to sign I will be happy to do so-I recommend talking to [facility] to find out how to go about doing what the patient is requesting

## 2016-05-06 NOTE — Telephone Encounter (Signed)
Patient wanted to tell Larene Beach that If you do not receive a fax from [facility] within an hour, give her a call and she will call them back.

## 2016-05-06 NOTE — Telephone Encounter (Signed)
Spoke with patient and informed that we have not received a fax from [facility].

## 2016-05-06 NOTE — Telephone Encounter (Signed)
Left message return call 05/06/16

## 2016-05-11 ENCOUNTER — Telehealth: Payer: Self-pay

## 2016-05-11 NOTE — Telephone Encounter (Signed)
Form received from [place] stating that Nitrofurantoin mono-mcr 100 mg has been approved. Valid 02/12/2016 - 03/28/2017. Form sent to be scanned.

## 2016-05-26 ENCOUNTER — Encounter: Payer: Self-pay | Admitting: Family Medicine

## 2016-05-27 ENCOUNTER — Encounter: Payer: Self-pay | Admitting: Family Medicine

## 2016-07-04 ENCOUNTER — Other Ambulatory Visit: Payer: Self-pay | Admitting: Family Medicine

## 2016-07-19 ENCOUNTER — Telehealth: Payer: Self-pay | Admitting: *Deleted

## 2016-07-19 ENCOUNTER — Encounter: Payer: Self-pay | Admitting: Family Medicine

## 2016-07-19 ENCOUNTER — Other Ambulatory Visit: Payer: Self-pay | Admitting: *Deleted

## 2016-07-19 DIAGNOSIS — K921 Melena: Secondary | ICD-10-CM

## 2016-07-19 NOTE — Telephone Encounter (Signed)
See mychart message.   Nurses patient needs to be seen this week also patient needs to do IFBOT 1+ also CBC, ferritin, TIBC. It would be fine for the patient to get blood drawn within 24 hours and to pick up the stool tests and follow-up middle to late this week.  Called pt and discussed. Pt verbalzied understanding. bw orders put in and IFBOT test put up front for pickup. Pt scheduled office visit for follow up this week with dr Nicki Reaper.

## 2016-07-19 NOTE — Telephone Encounter (Signed)
Nurses patient needs to be seen this week also patient needs to do IFBOT 1+ also CBC, ferritin, TIBC. It would be fine for the patient to get blood drawn within 24 hours and to pick up the stool tests and follow-up middle to late this week

## 2016-07-21 DIAGNOSIS — K921 Melena: Secondary | ICD-10-CM | POA: Diagnosis not present

## 2016-07-22 LAB — CBC WITH DIFFERENTIAL/PLATELET
BASOS ABS: 0.1 10*3/uL (ref 0.0–0.2)
Basos: 1 %
EOS (ABSOLUTE): 0.3 10*3/uL (ref 0.0–0.4)
EOS: 7 %
HEMOGLOBIN: 14 g/dL (ref 11.1–15.9)
Hematocrit: 42 % (ref 34.0–46.6)
IMMATURE GRANS (ABS): 0 10*3/uL (ref 0.0–0.1)
Immature Granulocytes: 0 %
Lymphocytes Absolute: 1.1 10*3/uL (ref 0.7–3.1)
Lymphs: 22 %
MCH: 28.7 pg (ref 26.6–33.0)
MCHC: 33.3 g/dL (ref 31.5–35.7)
MCV: 86 fL (ref 79–97)
MONOCYTES: 10 %
Monocytes Absolute: 0.5 10*3/uL (ref 0.1–0.9)
NEUTROS ABS: 3 10*3/uL (ref 1.4–7.0)
Neutrophils: 60 %
Platelets: 386 10*3/uL — ABNORMAL HIGH (ref 150–379)
RBC: 4.88 x10E6/uL (ref 3.77–5.28)
RDW: 14.2 % (ref 12.3–15.4)
WBC: 5 10*3/uL (ref 3.4–10.8)

## 2016-07-22 LAB — IRON AND TIBC
IRON SATURATION: 25 % (ref 15–55)
IRON: 88 ug/dL (ref 27–139)
TIBC: 351 ug/dL (ref 250–450)
UIBC: 263 ug/dL (ref 118–369)

## 2016-07-22 LAB — FERRITIN: FERRITIN: 91 ng/mL (ref 15–150)

## 2016-07-23 ENCOUNTER — Encounter: Payer: Self-pay | Admitting: Family Medicine

## 2016-07-23 ENCOUNTER — Ambulatory Visit (INDEPENDENT_AMBULATORY_CARE_PROVIDER_SITE_OTHER): Payer: PPO | Admitting: Family Medicine

## 2016-07-23 ENCOUNTER — Other Ambulatory Visit (INDEPENDENT_AMBULATORY_CARE_PROVIDER_SITE_OTHER): Payer: PPO | Admitting: *Deleted

## 2016-07-23 VITALS — BP 132/88 | Ht 61.0 in | Wt 209.0 lb

## 2016-07-23 DIAGNOSIS — R195 Other fecal abnormalities: Secondary | ICD-10-CM

## 2016-07-23 LAB — IFOBT (OCCULT BLOOD): IMMUNOLOGICAL FECAL OCCULT BLOOD TEST: NEGATIVE

## 2016-07-23 NOTE — Progress Notes (Signed)
   Subjective:    Patient ID: Kristen Mack, female    DOB: 1945/12/02, 71 y.o.   MRN: [id number]  HPIFollow up stool test.  The patient comes in today to discuss follow-up or stool tests. She had a school stool that was a reddish color she doesn't know if it was blood or not it just concerned her so we did some testing her stool tests looked okay lab work looked good she is up-to-date on colonoscopy she denies any abdominal pains no vomiting no melena and no hematochezia     Review of Systems    please see above no weight loss or changes. Objective:   Physical Exam  Lungs are clear hearts regular abdomen soft no guarding rebound or tenderness  Lab work looks good stool tests negative for blood    Assessment & Plan:  [place] stools by report 2-all testing negative so far. Colonoscopy a few years ago negative. I sent one additional stool tests home with the patient- IFBOT-she will return that to [place] if she has another [place] stool. Also we will send a copy of this to her gastroenterologist to see if they recommend any other intervention

## 2016-08-10 ENCOUNTER — Ambulatory Visit (INDEPENDENT_AMBULATORY_CARE_PROVIDER_SITE_OTHER): Payer: PPO | Admitting: Family Medicine

## 2016-08-10 ENCOUNTER — Encounter: Payer: Self-pay | Admitting: Family Medicine

## 2016-08-10 VITALS — BP 144/86 | Ht 61.0 in | Wt 207.4 lb

## 2016-08-10 DIAGNOSIS — M797 Fibromyalgia: Secondary | ICD-10-CM

## 2016-08-10 DIAGNOSIS — R7303 Prediabetes: Secondary | ICD-10-CM | POA: Diagnosis not present

## 2016-08-10 DIAGNOSIS — R400 Somnolence: Secondary | ICD-10-CM | POA: Diagnosis not present

## 2016-08-10 DIAGNOSIS — I10 Essential (primary) hypertension: Secondary | ICD-10-CM | POA: Diagnosis not present

## 2016-08-10 DIAGNOSIS — R5383 Other fatigue: Secondary | ICD-10-CM

## 2016-08-10 DIAGNOSIS — L570 Actinic keratosis: Secondary | ICD-10-CM

## 2016-08-10 DIAGNOSIS — R740 Nonspecific elevation of levels of transaminase and lactic acid dehydrogenase [LDH]: Secondary | ICD-10-CM

## 2016-08-10 DIAGNOSIS — M545 Low back pain, unspecified: Secondary | ICD-10-CM

## 2016-08-10 DIAGNOSIS — R0609 Other forms of dyspnea: Secondary | ICD-10-CM

## 2016-08-10 DIAGNOSIS — R7401 Elevation of levels of liver transaminase levels: Secondary | ICD-10-CM

## 2016-08-10 DIAGNOSIS — K760 Fatty (change of) liver, not elsewhere classified: Secondary | ICD-10-CM | POA: Diagnosis not present

## 2016-08-10 MED ORDER — AMLODIPINE BESYLATE 5 MG PO TABS: 5.0000 mg | ORAL_TABLET | Freq: Every day | ORAL | 6 refills | Status: DC

## 2016-08-10 NOTE — Progress Notes (Signed)
   Subjective:    Patient ID: Kristen Mack, female    DOB: [birth year], 71 y.o.   MRN: [id number]  HPI Patient in today for fatigue. States fatigue has been gradually worsening over the past two months.  The patient denies any shortness of breath currently she gets fatigued easily She also relates taking her thyroid medicine previous TSH within the past several months looked good Takes her blood pressure medicine although systolic number is been high at times Patient denies being depressed. She does have a history of fatty liver. Also complains of lumbar pain radiates into both legs intermittently Also has concerns of bilateral hip and knee pain.   Review of Systems She relates a lot of fatigue tiredness denies chest tightness pressure pain denies rectal bleeding no vomiting she relates a lot of daytime fatigue and sleepiness. Does not rest well at night has a lot of muscle aches joint pains also back pain and sciatica into both legs    Objective:   Physical Exam Lungs are clear no crackles heart is regular blood pressure was taken on her right forearm per patient request mild elevation of systolic number   25 minutes was spent with the patient. Greater than half the time was spent in discussion and answering questions and counseling regarding the issues that the patient came in for today. Very involved patient multiple issues discussed    Assessment & Plan:  Fatigue tiredness possibly related to fibromyalgia lab work pending may need referral to endocrinology to evaluate for possible adrenal insufficiency  Daytime fatigue and tiredness [place] questionnaire filled out a sleep study may well be needed  History of fatty liver repeat liver profile it is possible this could play a function in her fatigue  Fibromyalgia-patient does not one to start Cymbalta does not one to start Lyrica she will do stretching exercises  Blood pressure stop verapamil try amlodipine 5 mg daily. Continue  losartan  Recheck blood pressure in several weeks' time for follow-up office visit  Lumbar back pain with sciatica patient doing stretching exercises  Mild shortness of breath with vigorous activity check BNP patient concerned about possibility of CHF I do not hear any crackles  Actinic keratosis patient will be seeing dermatology she will schedule her an appointment  Prediabetes will check A1c await the results

## 2016-08-10 NOTE — Patient Instructions (Signed)
DASH Eating Plan DASH stands for "Dietary Approaches to Stop Hypertension." The DASH eating plan is a healthy eating plan that has been shown to reduce high blood pressure (hypertension). It may also reduce your risk for type 2 diabetes, heart disease, and stroke. The DASH eating plan may also help with weight loss. What are tips for following this plan? General guidelines  Avoid eating more than 2,300 mg (milligrams) of salt (sodium) a day. If you have hypertension, you may need to reduce your sodium intake to 1,500 mg a day.  Limit alcohol intake to no more than 1 drink a day for nonpregnant women and 2 drinks a day for men. One drink equals 12 oz of beer, 5 oz of wine, or 1 oz of hard liquor.  Work with your health care provider to maintain a healthy body weight or to lose weight. Ask what an ideal weight is for you.  Get at least 30 minutes of exercise that causes your heart to beat faster (aerobic exercise) most days of the week. Activities may include walking, swimming, or biking.  Work with your health care provider or diet and nutrition specialist (dietitian) to adjust your eating plan to your individual calorie needs. Reading food labels  Check food labels for the amount of sodium per serving. Choose foods with less than 5 percent of the Daily Value of sodium. Generally, foods with less than 300 mg of sodium per serving fit into this eating plan.  To find whole grains, look for the word "whole" as the first word in the ingredient list. Shopping  Buy products labeled as "low-sodium" or "no salt added."  Buy fresh foods. Avoid canned foods and premade or frozen meals. Cooking  Avoid adding salt when cooking. Use salt-free seasonings or herbs instead of table salt or sea salt. Check with your health care provider or pharmacist before using salt substitutes.  Do not fry foods. Cook foods using healthy methods such as baking, boiling, grilling, and broiling instead.  Cook with  heart-healthy oils, such as olive, canola, soybean, or sunflower oil. Meal planning   Eat a balanced diet that includes: ? 5 or more servings of fruits and vegetables each day. At each meal, try to fill half of your plate with fruits and vegetables. ? Up to 6-8 servings of whole grains each day. ? Less than 6 oz of lean meat, poultry, or fish each day. A 3-oz serving of meat is about the same size as a deck of cards. One egg equals 1 oz. ? 2 servings of low-fat dairy each day. ? A serving of nuts, seeds, or beans 5 times each week. ? Heart-healthy fats. Healthy fats called Omega-3 fatty acids are found in foods such as flaxseeds and coldwater fish, like sardines, salmon, and mackerel.  Limit how much you eat of the following: ? Canned or prepackaged foods. ? Food that is high in trans fat, such as fried foods. ? Food that is high in saturated fat, such as fatty meat. ? Sweets, desserts, sugary drinks, and other foods with added sugar. ? Full-fat dairy products.  Do not salt foods before eating.  Try to eat at least 2 vegetarian meals each week.  Eat more home-cooked food and less restaurant, buffet, and fast food.  When eating at a restaurant, ask that your food be prepared with less salt or no salt, if possible. What foods are recommended? The items listed may not be a complete list. Talk with your dietitian about what   dietary choices are best for you. Grains Whole-grain or whole-wheat bread. Whole-grain or whole-wheat pasta. Brown rice. Oatmeal. Quinoa. Bulgur. Whole-grain and low-sodium cereals. Pita bread. Low-fat, low-sodium crackers. Whole-wheat flour tortillas. Vegetables Fresh or frozen vegetables (raw, steamed, roasted, or grilled). Low-sodium or reduced-sodium tomato and vegetable juice. Low-sodium or reduced-sodium tomato sauce and tomato paste. Low-sodium or reduced-sodium canned vegetables. Fruits All fresh, dried, or frozen fruit. Canned fruit in natural juice (without  added sugar). Meat and other protein foods Skinless chicken or turkey. Ground chicken or turkey. Pork with fat trimmed off. Fish and seafood. Egg whites. Dried beans, peas, or lentils. Unsalted nuts, nut butters, and seeds. Unsalted canned beans. Lean cuts of beef with fat trimmed off. Low-sodium, lean deli meat. Dairy Low-fat (1%) or fat-free (skim) milk. Fat-free, low-fat, or reduced-fat cheeses. Nonfat, low-sodium ricotta or cottage cheese. Low-fat or nonfat yogurt. Low-fat, low-sodium cheese. Fats and oils Soft margarine without trans fats. Vegetable oil. Low-fat, reduced-fat, or light mayonnaise and salad dressings (reduced-sodium). Canola, safflower, olive, soybean, and sunflower oils. Avocado. Seasoning and other foods Herbs. Spices. Seasoning mixes without salt. Unsalted popcorn and pretzels. Fat-free sweets. What foods are not recommended? The items listed may not be a complete list. Talk with your dietitian about what dietary choices are best for you. Grains Baked goods made with fat, such as croissants, muffins, or some breads. Dry pasta or rice meal packs. Vegetables Creamed or fried vegetables. Vegetables in a cheese sauce. Regular canned vegetables (not low-sodium or reduced-sodium). Regular canned tomato sauce and paste (not low-sodium or reduced-sodium). Regular tomato and vegetable juice (not low-sodium or reduced-sodium). Pickles. Olives. Fruits Canned fruit in a light or heavy syrup. Fried fruit. Fruit in cream or butter sauce. Meat and other protein foods Fatty cuts of meat. Ribs. Fried meat. Bacon. Sausage. Bologna and other processed lunch meats. Salami. Fatback. Hotdogs. Bratwurst. Salted nuts and seeds. Canned beans with added salt. Canned or smoked fish. Whole eggs or egg yolks. Chicken or turkey with skin. Dairy Whole or 2% milk, cream, and half-and-half. Whole or full-fat cream cheese. Whole-fat or sweetened yogurt. Full-fat cheese. Nondairy creamers. Whipped toppings.  Processed cheese and cheese spreads. Fats and oils Butter. Stick margarine. Lard. Shortening. Ghee. Bacon fat. Tropical oils, such as coconut, palm kernel, or palm oil. Seasoning and other foods Salted popcorn and pretzels. Onion salt, garlic salt, seasoned salt, table salt, and sea salt. Worcestershire sauce. Tartar sauce. Barbecue sauce. Teriyaki sauce. Soy sauce, including reduced-sodium. Steak sauce. Canned and packaged gravies. Fish sauce. Oyster sauce. Cocktail sauce. Horseradish that you find on the shelf. Ketchup. Mustard. Meat flavorings and tenderizers. Bouillon cubes. Hot sauce and Tabasco sauce. Premade or packaged marinades. Premade or packaged taco seasonings. Relishes. Regular salad dressings. Where to find more information:  National Heart, Lung, and Blood Institute: www.nhlbi.nih.gov  American Heart Association: www.heart.org Summary  The DASH eating plan is a healthy eating plan that has been shown to reduce high blood pressure (hypertension). It may also reduce your risk for type 2 diabetes, heart disease, and stroke.  With the DASH eating plan, you should limit salt (sodium) intake to 2,300 mg a day. If you have hypertension, you may need to reduce your sodium intake to 1,500 mg a day.  When on the DASH eating plan, aim to eat more fresh fruits and vegetables, whole grains, lean proteins, low-fat dairy, and heart-healthy fats.  Work with your health care provider or diet and nutrition specialist (dietitian) to adjust your eating plan to your individual   calorie needs. This information is not intended to replace advice given to you by your health care provider. Make sure you discuss any questions you have with your health care provider. Document Released: 03/04/2011 Document Revised: 03/08/2016 Document Reviewed: 03/08/2016 Elsevier Interactive Patient Education  2017 Elsevier Inc.  

## 2016-08-11 DIAGNOSIS — L570 Actinic keratosis: Secondary | ICD-10-CM | POA: Diagnosis not present

## 2016-08-12 LAB — HEPATIC FUNCTION PANEL
ALK PHOS: 86 IU/L (ref 39–117)
ALT: 47 IU/L — ABNORMAL HIGH (ref 0–32)
AST: 44 IU/L — ABNORMAL HIGH (ref 0–40)
Albumin: 3.9 g/dL (ref 3.5–4.8)
BILIRUBIN, DIRECT: 0.1 mg/dL (ref 0.00–0.40)
Bilirubin Total: 0.3 mg/dL (ref 0.0–1.2)
TOTAL PROTEIN: 6.8 g/dL (ref 6.0–8.5)

## 2016-08-12 LAB — CORTISOL: Cortisol: 19 ug/dL

## 2016-08-12 LAB — HEMOGLOBIN A1C
Est. average glucose Bld gHb Est-mCnc: 126 mg/dL
Hgb A1c MFr Bld: 6 % — ABNORMAL HIGH (ref 4.8–5.6)

## 2016-08-12 LAB — SEDIMENTATION RATE: Sed Rate: 13 mm/hr (ref 0–40)

## 2016-08-12 LAB — BRAIN NATRIURETIC PEPTIDE: BNP: 64.5 pg/mL (ref 0.0–100.0)

## 2016-09-01 ENCOUNTER — Encounter: Payer: Self-pay | Admitting: Family Medicine

## 2016-09-01 MED ORDER — ALBUTEROL SULFATE HFA 108 (90 BASE) MCG/ACT IN AERS: INHALATION_SPRAY | RESPIRATORY_TRACT | 5 refills | Status: DC

## 2016-09-01 MED ORDER — LOSARTAN POTASSIUM 100 MG PO TABS: 100.0000 mg | ORAL_TABLET | Freq: Every day | ORAL | 4 refills | Status: DC

## 2016-09-20 ENCOUNTER — Ambulatory Visit: Payer: PPO | Admitting: Family Medicine

## 2016-09-23 ENCOUNTER — Encounter: Payer: Self-pay | Admitting: Family Medicine

## 2016-09-23 ENCOUNTER — Other Ambulatory Visit: Payer: Self-pay

## 2016-09-23 MED ORDER — GLUCOSE BLOOD VI STRP: ORAL_STRIP | 1 refills | Status: DC

## 2016-10-03 ENCOUNTER — Encounter: Payer: Self-pay | Admitting: Family Medicine

## 2016-10-04 MED ORDER — ALBUTEROL SULFATE HFA 108 (90 BASE) MCG/ACT IN AERS: INHALATION_SPRAY | RESPIRATORY_TRACT | 1 refills | Status: DC

## 2016-10-22 ENCOUNTER — Telehealth: Payer: Self-pay | Admitting: Family Medicine

## 2016-10-22 DIAGNOSIS — Z79899 Other long term (current) drug therapy: Secondary | ICD-10-CM

## 2016-10-22 DIAGNOSIS — K760 Fatty (change of) liver, not elsewhere classified: Secondary | ICD-10-CM

## 2016-10-22 DIAGNOSIS — E038 Other specified hypothyroidism: Secondary | ICD-10-CM

## 2016-10-22 NOTE — Telephone Encounter (Signed)
Left message return call 10/22/2016

## 2016-10-22 NOTE — Telephone Encounter (Signed)
TSH, lipid, liver

## 2016-10-22 NOTE — Telephone Encounter (Signed)
Spoke with patient and informed her per Dr.Scott Luking- Labs have been ordered. Patient verbalized understanding. 

## 2016-10-22 NOTE — Telephone Encounter (Signed)
Pt is needing lab orders sent over for her appt on Monday. Pt went to lab today thinking they were already ordered. Pt will go in the morning to get them done. appt is with Dr. Nicki Reaper.

## 2016-10-23 DIAGNOSIS — E038 Other specified hypothyroidism: Secondary | ICD-10-CM | POA: Diagnosis not present

## 2016-10-23 DIAGNOSIS — Z79899 Other long term (current) drug therapy: Secondary | ICD-10-CM | POA: Diagnosis not present

## 2016-10-23 DIAGNOSIS — K760 Fatty (change of) liver, not elsewhere classified: Secondary | ICD-10-CM | POA: Diagnosis not present

## 2016-10-24 LAB — LIPID PANEL
CHOL/HDL RATIO: 3.8 ratio (ref 0.0–4.4)
Cholesterol, Total: 155 mg/dL (ref 100–199)
HDL: 41 mg/dL (ref >39)
LDL Calculated: 77 mg/dL (ref 0–99)
TRIGLYCERIDES: 183 mg/dL — AB (ref 0–149)
VLDL Cholesterol Cal: 37 mg/dL (ref 5–40)

## 2016-10-24 LAB — HEPATIC FUNCTION PANEL
ALK PHOS: 97 IU/L (ref 39–117)
ALT: 67 IU/L — ABNORMAL HIGH (ref 0–32)
AST: 62 IU/L — ABNORMAL HIGH (ref 0–40)
Albumin: 4.3 g/dL (ref 3.5–4.8)
BILIRUBIN TOTAL: 0.4 mg/dL (ref 0.0–1.2)
BILIRUBIN, DIRECT: 0.12 mg/dL (ref 0.00–0.40)
TOTAL PROTEIN: 7.6 g/dL (ref 6.0–8.5)

## 2016-10-24 LAB — TSH: TSH: 6.33 u[IU]/mL — AB (ref 0.450–4.500)

## 2016-10-25 ENCOUNTER — Encounter: Payer: Self-pay | Admitting: Family Medicine

## 2016-10-25 ENCOUNTER — Ambulatory Visit (INDEPENDENT_AMBULATORY_CARE_PROVIDER_SITE_OTHER): Payer: PPO | Admitting: Family Medicine

## 2016-10-25 VITALS — BP 128/84 | Ht 61.0 in | Wt 210.0 lb

## 2016-10-25 DIAGNOSIS — I10 Essential (primary) hypertension: Secondary | ICD-10-CM

## 2016-10-25 DIAGNOSIS — K760 Fatty (change of) liver, not elsewhere classified: Secondary | ICD-10-CM

## 2016-10-25 DIAGNOSIS — E038 Other specified hypothyroidism: Secondary | ICD-10-CM | POA: Diagnosis not present

## 2016-10-25 MED ORDER — LEVOTHYROXINE SODIUM 150 MCG PO TABS: ORAL_TABLET | ORAL | 5 refills | Status: DC

## 2016-10-25 NOTE — Progress Notes (Signed)
   Subjective:    Patient ID: Kristen Mack, female    DOB: [birth year], 71 y.o.   MRN: [id number]  Hypertension  This is a chronic problem. The current episode started more than 1 year ago. Pertinent negatives include no chest pain, headaches or shortness of breath. Compliance problems include exercise (tries to eat healthy, takes meds every day).    Patient with a fair amount of fatigue tiredness recent lab work shows elevated liver enzymes also shows elevated TSH she does have fibromyalgia which limits her activity she states dietary is not been good recently but she is making improvements   Review of Systems  Constitutional: Positive for fatigue. Negative for activity change and fever.  Respiratory: Negative for cough and shortness of breath.   Cardiovascular: Negative for chest pain and leg swelling.  Musculoskeletal: Positive for myalgias.  Neurological: Negative for headaches.       Objective:   Physical Exam  Constitutional: She appears well-nourished. No distress.  Cardiovascular: Normal rate, regular rhythm and normal heart sounds.   No murmur heard. Pulmonary/Chest: Effort normal and breath sounds normal. No respiratory distress.  Musculoskeletal: She exhibits no edema.  Lymphadenopathy:    She has no cervical adenopathy.  Neurological: She is alert. She exhibits normal muscle tone.  Psychiatric: Her behavior is normal.  Vitals reviewed.         Assessment & Plan:  Blood pressure much better continue current measures Thyroid needs adjusting increase levothyroxine 150 g daily Recheck TSH 8 weeks Follow-up office visit in 4 months with repeat liver profile Fatty liver with Karlene Lineman Encourage patient healthy eating weight loss.

## 2016-11-02 ENCOUNTER — Other Ambulatory Visit: Payer: Self-pay | Admitting: Family Medicine

## 2016-11-02 DIAGNOSIS — Z1231 Encounter for screening mammogram for malignant neoplasm of breast: Secondary | ICD-10-CM

## 2016-11-18 ENCOUNTER — Ambulatory Visit (HOSPITAL_COMMUNITY)
Admission: RE | Admit: 2016-11-18 | Discharge: 2016-11-18 | Disposition: A | Discharge status: Home / Self Care | Payer: PPO | Source: Ambulatory Visit | Attending: Family Medicine | Admitting: Family Medicine

## 2016-11-18 DIAGNOSIS — M810 Age-related osteoporosis without current pathological fracture: Secondary | ICD-10-CM | POA: Diagnosis not present

## 2016-11-18 DIAGNOSIS — Z780 Asymptomatic menopausal state: Secondary | ICD-10-CM | POA: Insufficient documentation

## 2016-11-18 DIAGNOSIS — Z1231 Encounter for screening mammogram for malignant neoplasm of breast: Secondary | ICD-10-CM | POA: Insufficient documentation

## 2016-11-18 DIAGNOSIS — M8580 Other specified disorders of bone density and structure, unspecified site: Secondary | ICD-10-CM | POA: Diagnosis present

## 2016-11-21 ENCOUNTER — Encounter: Payer: Self-pay | Admitting: Family Medicine

## 2016-11-21 DIAGNOSIS — M810 Age-related osteoporosis without current pathological fracture: Secondary | ICD-10-CM | POA: Insufficient documentation

## 2016-12-06 ENCOUNTER — Ambulatory Visit (INDEPENDENT_AMBULATORY_CARE_PROVIDER_SITE_OTHER): Payer: PPO | Admitting: Family Medicine

## 2016-12-06 ENCOUNTER — Encounter: Payer: Self-pay | Admitting: Family Medicine

## 2016-12-06 VITALS — BP 112/64 | Ht 61.0 in | Wt 207.0 lb

## 2016-12-06 DIAGNOSIS — E038 Other specified hypothyroidism: Secondary | ICD-10-CM | POA: Diagnosis not present

## 2016-12-06 DIAGNOSIS — M810 Age-related osteoporosis without current pathological fracture: Secondary | ICD-10-CM

## 2016-12-06 DIAGNOSIS — R7303 Prediabetes: Secondary | ICD-10-CM

## 2016-12-06 DIAGNOSIS — I10 Essential (primary) hypertension: Secondary | ICD-10-CM

## 2016-12-06 MED ORDER — BUPROPION HCL ER (SR) 150 MG PO TB12: 150.0000 mg | ORAL_TABLET | Freq: Two times a day (BID) | ORAL | 4 refills | Status: DC

## 2016-12-06 MED ORDER — POTASSIUM CHLORIDE CRYS ER 20 MEQ PO TBCR: 20.0000 meq | EXTENDED_RELEASE_TABLET | Freq: Every day | ORAL | 3 refills | Status: DC

## 2016-12-06 NOTE — Progress Notes (Signed)
   Subjective:    Patient ID: Kristen Mack, female    DOB: Dec 04, 1945, 71 y.o.   MRN: [id number]  HPIpt arrives to discuss bone density test results.  Dizziness, sinus pressure and nausea. Doesn't think clearly when she gets the dizziness. Started 3 days ago.  She denies any sweats chills vomiting  20 minutes spent today discussing her osteoporosis and treatment options Needs refill on potassium.     Review of Systems     Objective:   Physical Exam  Lungs clear heart regular pulse normal HEENT benign 20 minutes spent with patient discussing osteoporosis including treatments and treatment options     Assessment & Plan:  Osteoporosis Patient will consider reclast Patient not a candidate for oral biphosphonate's because of esophageal stomach issues  She will let us know within the next 2 weeks what she has decided  Lab testing in October follow-up office visit in December

## 2016-12-15 ENCOUNTER — Telehealth: Payer: Self-pay | Admitting: Family Medicine

## 2016-12-15 DIAGNOSIS — M810 Age-related osteoporosis without current pathological fracture: Secondary | ICD-10-CM

## 2016-12-15 NOTE — Telephone Encounter (Signed)
Please order Reclast IV once yearly, patient has severe GERD with esophageal issues will not be able to tolerate oral biphosphonate's

## 2016-12-15 NOTE — Telephone Encounter (Signed)
Order for referral put in.

## 2016-12-15 NOTE — Telephone Encounter (Signed)
Pt called to notify our office that she's decided that she would like to do RECLAST   This requires a prior auth with her insurance  Please advise on reason for IV infusion meds v/s oral meds  Pt also needs a BUN & Creatinine within 30 days of infustion (was ordered but no results yet, pt may not have had labs done)  Please forward to [facility] to process prior auth & complete order form for [facility]

## 2016-12-16 ENCOUNTER — Telehealth: Payer: Self-pay | Admitting: Family Medicine

## 2016-12-16 NOTE — Telephone Encounter (Signed)
Patient stepped on tack last night,its red but no pain .She wanted to know if she needed a tetanus shot.

## 2016-12-16 NOTE — Telephone Encounter (Signed)
No record of tetanus shot in epic, or paper chart. Patient states does not recall having tetanus is a tetanus shot recommend for this injury.Please advise?

## 2016-12-16 NOTE — Telephone Encounter (Signed)
Left message return call 12/16/16

## 2016-12-16 NOTE — Telephone Encounter (Signed)
Nurse visit with tetanus shot would be advisable if patient feels it's infected she will need to see provider as well. This can occur tomorrow.

## 2016-12-17 ENCOUNTER — Ambulatory Visit: Payer: PPO

## 2016-12-17 NOTE — Telephone Encounter (Signed)
Spoke with patient and informed her per Dr.Scott Luking- A tetanus shot would be advisable if you feel it is infected recommend an office visit. If you feel you just need the shot may see a nurse to have vaccine administered. Patient verbalized understanding.

## 2016-12-21 ENCOUNTER — Encounter: Payer: Self-pay | Admitting: Family Medicine

## 2016-12-21 DIAGNOSIS — R809 Proteinuria, unspecified: Secondary | ICD-10-CM

## 2016-12-21 NOTE — Telephone Encounter (Signed)
Nurses #1 when the patient does report blood work next week it we will check her kidney function #2 the medication is relatively safe as long as her kidney functions look good #3 please have [facility] check to see if prior authorization necessary/if it has already been done please inform the patient that it is approved #4 finally you have my permission to tell the patient that certainly she does not have to take the medicine but she runs higher risk of fractures without the medicine

## 2016-12-22 NOTE — Telephone Encounter (Signed)
Patient advised Nurses #1 when the patient does her blood work next week it  will check her kidney function #2 the medication is relatively safe as long as her kidney functions look good #3 the patient was notified that it is approved #4 finally you have my permission to tell the patient that certainly she does not have to take the medicine but she runs higher risk of fractures without the medicine. Patient verbalized understanding and is going to wait for her blood work results before she decides whether she is going to follow thru with treatment.

## 2016-12-23 NOTE — Telephone Encounter (Signed)
Nurses-please go ahead and also order urinalysis and urine ACR for the patient to do when she does her lab work next week send her message notifying her of that thank you

## 2016-12-24 NOTE — Addendum Note (Signed)
Addended by: Dairl Ponder on: 12/24/2016 08:47 AM   Modules accepted: Orders

## 2016-12-24 NOTE — Addendum Note (Signed)
Addended by: Dairl Ponder on: 12/24/2016 08:46 AM   Modules accepted: Orders

## 2016-12-31 DIAGNOSIS — R809 Proteinuria, unspecified: Secondary | ICD-10-CM | POA: Diagnosis not present

## 2016-12-31 DIAGNOSIS — E038 Other specified hypothyroidism: Secondary | ICD-10-CM | POA: Diagnosis not present

## 2016-12-31 DIAGNOSIS — R7303 Prediabetes: Secondary | ICD-10-CM | POA: Diagnosis not present

## 2016-12-31 DIAGNOSIS — I10 Essential (primary) hypertension: Secondary | ICD-10-CM | POA: Diagnosis not present

## 2017-01-01 LAB — URINALYSIS, ROUTINE W REFLEX MICROSCOPIC
BILIRUBIN UA: NEGATIVE
Glucose, UA: NEGATIVE
Nitrite, UA: NEGATIVE
RBC UA: NEGATIVE
SPEC GRAV UA: 1.026 (ref 1.005–1.030)
Urobilinogen, Ur: 0.2 mg/dL (ref 0.2–1.0)
pH, UA: 6 (ref 5.0–7.5)

## 2017-01-01 LAB — T4, FREE: Free T4: 1.67 ng/dL (ref 0.82–1.77)

## 2017-01-01 LAB — BASIC METABOLIC PANEL
BUN / CREAT RATIO: 16 (ref 12–28)
BUN: 11 mg/dL (ref 8–27)
CHLORIDE: 104 mmol/L (ref 96–106)
CO2: 22 mmol/L (ref 20–29)
Calcium: 9.8 mg/dL (ref 8.7–10.3)
Creatinine, Ser: 0.7 mg/dL (ref 0.57–1.00)
GFR calc non Af Amer: 87 mL/min/{1.73_m2} (ref >59)
GFR, EST AFRICAN AMERICAN: 100 mL/min/{1.73_m2} (ref >59)
GLUCOSE: 145 mg/dL — AB (ref 65–99)
Potassium: 4.2 mmol/L (ref 3.5–5.2)
SODIUM: 142 mmol/L (ref 134–144)

## 2017-01-01 LAB — TSH: TSH: 0.492 u[IU]/mL (ref 0.450–4.500)

## 2017-01-01 LAB — MICROSCOPIC EXAMINATION: Casts: NONE SEEN /lpf

## 2017-01-01 LAB — MICROALBUMIN / CREATININE URINE RATIO
CREATININE, UR: 290.4 mg/dL
MICROALBUM., U, RANDOM: 189.9 ug/mL
Microalb/Creat Ratio: 65.4 mg/g creat — ABNORMAL HIGH (ref 0.0–30.0)

## 2017-01-01 LAB — HEMOGLOBIN A1C
ESTIMATED AVERAGE GLUCOSE: 131 mg/dL
HEMOGLOBIN A1C: 6.2 % — AB (ref 4.8–5.6)

## 2017-01-03 ENCOUNTER — Other Ambulatory Visit: Payer: Self-pay

## 2017-01-03 DIAGNOSIS — D72829 Elevated white blood cell count, unspecified: Secondary | ICD-10-CM

## 2017-01-05 LAB — URINE CULTURE

## 2017-01-06 MED ORDER — NITROFURANTOIN MONOHYD MACRO 100 MG PO CAPS: 100.0000 mg | ORAL_CAPSULE | Freq: Two times a day (BID) | ORAL | 0 refills | Status: DC

## 2017-01-25 ENCOUNTER — Other Ambulatory Visit: Payer: Self-pay | Admitting: Family Medicine

## 2017-01-28 ENCOUNTER — Encounter: Payer: Self-pay | Admitting: Family Medicine

## 2017-02-04 ENCOUNTER — Encounter (HOSPITAL_COMMUNITY)
Admission: RE | Admit: 2017-02-04 | Discharge: 2017-02-04 | Disposition: A | Discharge status: Home / Self Care | Payer: PPO | Source: Ambulatory Visit | Attending: Family Medicine | Admitting: Family Medicine

## 2017-02-13 ENCOUNTER — Encounter: Payer: Self-pay | Admitting: Family Medicine

## 2017-02-13 DIAGNOSIS — Z79899 Other long term (current) drug therapy: Secondary | ICD-10-CM

## 2017-02-13 DIAGNOSIS — E785 Hyperlipidemia, unspecified: Secondary | ICD-10-CM

## 2017-02-15 NOTE — Telephone Encounter (Signed)
Patient will need lipid, liver-hyperlipidemia elevated transaminases.  Patient can get her flu and pneumonia shot when she comes.  I highly advise her husband to set up a nurse visit so he can get those but this may not necessarily be able to be done at the time of his wife's visit (because my nurse will be busy with my patients I am not certain that there is a nursing slot available for shots at that time)

## 2017-02-15 NOTE — Addendum Note (Signed)
Addended by: Dairl Ponder on: 02/15/2017 01:58 PM   Modules accepted: Orders

## 2017-02-22 DIAGNOSIS — E119 Type 2 diabetes mellitus without complications: Secondary | ICD-10-CM | POA: Diagnosis not present

## 2017-02-22 DIAGNOSIS — H2513 Age-related nuclear cataract, bilateral: Secondary | ICD-10-CM | POA: Diagnosis not present

## 2017-02-28 ENCOUNTER — Ambulatory Visit: Payer: PPO | Admitting: Family Medicine

## 2017-02-28 ENCOUNTER — Other Ambulatory Visit: Payer: Self-pay | Admitting: Family Medicine

## 2017-03-03 ENCOUNTER — Ambulatory Visit (INDEPENDENT_AMBULATORY_CARE_PROVIDER_SITE_OTHER): Payer: PPO

## 2017-03-03 DIAGNOSIS — Z23 Encounter for immunization: Secondary | ICD-10-CM | POA: Diagnosis not present

## 2017-03-04 DIAGNOSIS — E785 Hyperlipidemia, unspecified: Secondary | ICD-10-CM | POA: Diagnosis not present

## 2017-03-04 DIAGNOSIS — Z79899 Other long term (current) drug therapy: Secondary | ICD-10-CM | POA: Diagnosis not present

## 2017-03-05 LAB — HEPATIC FUNCTION PANEL
ALT: 77 IU/L — AB (ref 0–32)
AST: 80 IU/L — ABNORMAL HIGH (ref 0–40)
Albumin: 4 g/dL (ref 3.5–4.8)
Alkaline Phosphatase: 114 IU/L (ref 39–117)
Bilirubin Total: 0.5 mg/dL (ref 0.0–1.2)
Bilirubin, Direct: 0.16 mg/dL (ref 0.00–0.40)
Total Protein: 7.3 g/dL (ref 6.0–8.5)

## 2017-03-05 LAB — LIPID PANEL
CHOL/HDL RATIO: 3.8 ratio (ref 0.0–4.4)
Cholesterol, Total: 165 mg/dL (ref 100–199)
HDL: 44 mg/dL (ref >39)
LDL CALC: 87 mg/dL (ref 0–99)
TRIGLYCERIDES: 172 mg/dL — AB (ref 0–149)
VLDL CHOLESTEROL CAL: 34 mg/dL (ref 5–40)

## 2017-03-07 ENCOUNTER — Encounter: Payer: Self-pay | Admitting: Family Medicine

## 2017-03-11 ENCOUNTER — Encounter (HOSPITAL_COMMUNITY)
Admission: RE | Admit: 2017-03-11 | Discharge: 2017-03-11 | Disposition: A | Discharge status: Home / Self Care | Payer: PPO | Source: Ambulatory Visit | Attending: Family Medicine | Admitting: Family Medicine

## 2017-03-11 DIAGNOSIS — M810 Age-related osteoporosis without current pathological fracture: Secondary | ICD-10-CM | POA: Insufficient documentation

## 2017-03-11 LAB — COMPREHENSIVE METABOLIC PANEL WITH GFR
ALT: 87 U/L — ABNORMAL HIGH (ref 14–54)
AST: 109 U/L — ABNORMAL HIGH (ref 15–41)
Albumin: 3.8 g/dL (ref 3.5–5.0)
Alkaline Phosphatase: 107 U/L (ref 38–126)
Anion gap: 11 (ref 5–15)
BUN: 19 mg/dL (ref 6–20)
CO2: 24 mmol/L (ref 22–32)
Calcium: 9.4 mg/dL (ref 8.9–10.3)
Chloride: 104 mmol/L (ref 101–111)
Creatinine, Ser: 0.76 mg/dL (ref 0.44–1.00)
GFR calc Af Amer: >60 mL/min (ref >60)
GFR calc non Af Amer: >60 mL/min (ref >60)
Glucose, Bld: 130 mg/dL — ABNORMAL HIGH (ref 65–99)
Potassium: 3.2 mmol/L — ABNORMAL LOW (ref 3.5–5.1)
Sodium: 139 mmol/L (ref 135–145)
Total Bilirubin: 0.7 mg/dL (ref 0.3–1.2)
Total Protein: 7.8 g/dL (ref 6.5–8.1)

## 2017-03-11 MED ORDER — ZOLEDRONIC ACID 5 MG/100ML IV SOLN
INTRAVENOUS | Status: AC
  Filled 2017-03-11: qty 100

## 2017-03-11 MED ORDER — ZOLEDRONIC ACID 5 MG/100ML IV SOLN
5.0000 mg | Freq: Once | INTRAVENOUS | Status: AC
  Administered 2017-03-11: 5 mg via INTRAVENOUS

## 2017-03-11 MED ORDER — SODIUM CHLORIDE 0.9 % IV SOLN
INTRAVENOUS | Status: DC
  Administered 2017-03-11: 250 mL via INTRAVENOUS

## 2017-03-21 ENCOUNTER — Other Ambulatory Visit: Payer: Self-pay | Admitting: *Deleted

## 2017-03-21 DIAGNOSIS — E876 Hypokalemia: Secondary | ICD-10-CM

## 2017-03-29 ENCOUNTER — Other Ambulatory Visit: Payer: Self-pay | Admitting: Family Medicine

## 2017-04-05 ENCOUNTER — Ambulatory Visit: Payer: PPO | Admitting: Family Medicine

## 2017-04-12 DIAGNOSIS — E876 Hypokalemia: Secondary | ICD-10-CM | POA: Diagnosis not present

## 2017-04-13 LAB — BASIC METABOLIC PANEL
BUN/Creatinine Ratio: 20 (ref 12–28)
BUN: 14 mg/dL (ref 8–27)
CHLORIDE: 104 mmol/L (ref 96–106)
CO2: 22 mmol/L (ref 20–29)
Calcium: 9.3 mg/dL (ref 8.7–10.3)
Creatinine, Ser: 0.69 mg/dL (ref 0.57–1.00)
GFR calc non Af Amer: 87 mL/min/{1.73_m2} (ref >59)
GFR, EST AFRICAN AMERICAN: 101 mL/min/{1.73_m2} (ref >59)
Glucose: 116 mg/dL — ABNORMAL HIGH (ref 65–99)
POTASSIUM: 4.4 mmol/L (ref 3.5–5.2)
Sodium: 141 mmol/L (ref 134–144)

## 2017-04-19 ENCOUNTER — Ambulatory Visit (INDEPENDENT_AMBULATORY_CARE_PROVIDER_SITE_OTHER): Payer: PPO | Admitting: Family Medicine

## 2017-04-19 ENCOUNTER — Encounter: Payer: Self-pay | Admitting: Family Medicine

## 2017-04-19 VITALS — Ht 61.0 in | Wt 206.6 lb

## 2017-04-19 DIAGNOSIS — E038 Other specified hypothyroidism: Secondary | ICD-10-CM | POA: Diagnosis not present

## 2017-04-19 DIAGNOSIS — R7303 Prediabetes: Secondary | ICD-10-CM | POA: Diagnosis not present

## 2017-04-19 DIAGNOSIS — K760 Fatty (change of) liver, not elsewhere classified: Secondary | ICD-10-CM | POA: Diagnosis not present

## 2017-04-19 DIAGNOSIS — M797 Fibromyalgia: Secondary | ICD-10-CM

## 2017-04-19 DIAGNOSIS — K7581 Nonalcoholic steatohepatitis (NASH): Secondary | ICD-10-CM | POA: Diagnosis not present

## 2017-04-19 DIAGNOSIS — I10 Essential (primary) hypertension: Secondary | ICD-10-CM

## 2017-04-19 DIAGNOSIS — R0609 Other forms of dyspnea: Secondary | ICD-10-CM

## 2017-04-19 NOTE — Patient Instructions (Signed)
Do labs mid march before follow up

## 2017-04-19 NOTE — Progress Notes (Addendum)
Subjective:    Patient ID: Kristen Mack, female    DOB: November 26, 1945, 72 y.o.   MRN: [id number]  Hypertension  This is a chronic problem. The current episode started more than 1 year ago. Pertinent negatives include no chest pain. Risk factors for coronary artery disease include post-menopausal state. There are no compliance problems.   Patient has history of fibromyalgia for years manages it somewhat medication mainly with supplements as well as stretching  Patient has elevated liver enzyme-and fatty liver NASH patient understands the importance of trying to lose weight watch diet and exercise understands the importance of avoiding medications that can aggravate this.  Has seen gastroenterology before in the past is willing to go back if need be has noted that her abdomen is been swelling some she does not think it is fluid but she does have pedal edema  She has prediabetes watches starches in her diet knows she needs to cut back no she needs to lose weight tries to control this with diet  Has morbid obesity she understands the importance of trying to lose weight and exercise she has been struggling this for years  She relates shortness of breath with some activities but no chest tightness pressure pain doubtful that this is cardiac it is possible that the patient may be suffering with some lung issues or possibly deconditioning  Patient also has snoring at nighttime at times pauses with her breathing wonders if she may have sleep apnea and has a lot of daytime fatigue tiredness sleepiness Patient does not like to see GI doctor-patient states he is patronizing to her..   Patient is experiencing a lot of extreme fatigue and SOB is worsening.  Patient needs referral to cardiology for repeat stress test Patient would like to discuss a handicap placecard .   Patient also wants to know when she is due blood work or when or if she needs another abdominal ultrasound.   Review of Systems    Constitutional: Negative for activity change, appetite change and fatigue.  HENT: Negative for congestion.   Respiratory: Negative for cough.   Cardiovascular: Negative for chest pain.  Gastrointestinal: Negative for abdominal pain.  Endocrine: Negative for polydipsia and polyphagia.  Skin: Negative for color change.  Neurological: Negative for weakness.  Psychiatric/Behavioral: Negative for confusion.       Objective:   Physical Exam  Constitutional: She appears well-developed and well-nourished. No distress.  HENT:  Head: Normocephalic and atraumatic.  Eyes: Right eye exhibits no discharge. Left eye exhibits no discharge.  Neck: No tracheal deviation present.  Cardiovascular: Normal rate, regular rhythm and normal heart sounds.  No murmur heard. Pulmonary/Chest: Effort normal and breath sounds normal. No respiratory distress. She has no wheezes. She has no rales.  Musculoskeletal: She exhibits no edema.  Lymphadenopathy:    She has no cervical adenopathy.  Neurological: She is alert. She exhibits normal muscle tone.  Skin: Skin is warm and dry. No erythema.  Psychiatric: Her behavior is normal.  Vitals reviewed.    25 minutes was spent with the patient. Greater than half the time was spent in discussion and answering questions and counseling regarding the issues that the patient came in for today.  EKG did not show any acute changes.  No need for any type other intervention at the present moment    Assessment & Plan:  1. HTN (hypertension), benign Blood pressure under good control watch diet continue medications - Lipid panel  2. NASH (nonalcoholic steatohepatitis) Elevated  liver enzymes repeat these again in March with a follow-up office visit try to lose weight because of her condition and swelling in her abdomen with we will go ahead and do ultrasound of the abdomen - US Abdomen Complete - Hepatic function panel  3. Fatty liver Please see discussion above  4.  Other specified hypothyroidism Lab work ordered await results  5. Prediabetes Lab work ordered await results minimize starches in the diet stay physically active - Hemoglobin U8K - Basic metabolic panel  6. Morbid obesity ([place]) Cut back on portions stay active try to lose weight  7. Fibromyalgia Stretching exercises supplements.  8. DOE (dyspnea on exertion) Chest x-ray ordered echo ordered sleep study ordered because of significant fatigue tiredness and daytime sleepiness along with risk factors of sleep apnea including morbid obesity - DG Chest 2 View - ECHOCARDIOGRAM COMPLETE; Future - Ambulatory referral to Sleep Studies - PR ELECTROCARDIOGRAM, COMPLETE Follow-up mid March

## 2017-04-20 ENCOUNTER — Encounter: Payer: Self-pay | Admitting: Family Medicine

## 2017-04-21 ENCOUNTER — Other Ambulatory Visit: Payer: Self-pay | Admitting: Family Medicine

## 2017-04-22 ENCOUNTER — Telehealth: Payer: Self-pay | Admitting: Family Medicine

## 2017-04-22 NOTE — Telephone Encounter (Signed)
Please sign & date order for home sleep test in red folder in yellow box & forward to Brendale to send for scheduling

## 2017-04-25 NOTE — Telephone Encounter (Signed)
Order faxed, [facility] will contact pt for HST & depending on results CPAP (if needed) will be ordered through [place]

## 2017-04-25 NOTE — Telephone Encounter (Signed)
This form was signed, also [place] questionnaire was sent back and reviewed and will be scanned into the system thank you

## 2017-04-26 ENCOUNTER — Encounter (HOSPITAL_COMMUNITY): Payer: Self-pay

## 2017-04-26 ENCOUNTER — Ambulatory Visit (HOSPITAL_BASED_OUTPATIENT_CLINIC_OR_DEPARTMENT_OTHER)
Admission: RE | Admit: 2017-04-26 | Discharge: 2017-04-26 | Disposition: A | Discharge status: Home / Self Care | Payer: PPO | Source: Ambulatory Visit | Attending: Family Medicine | Admitting: Family Medicine

## 2017-04-26 ENCOUNTER — Ambulatory Visit (HOSPITAL_COMMUNITY)
Admission: RE | Admit: 2017-04-26 | Discharge: 2017-04-26 | Disposition: A | Discharge status: Home / Self Care | Payer: PPO | Source: Ambulatory Visit | Attending: Family Medicine | Admitting: Family Medicine

## 2017-04-26 DIAGNOSIS — R0602 Shortness of breath: Secondary | ICD-10-CM | POA: Diagnosis not present

## 2017-04-26 DIAGNOSIS — R7303 Prediabetes: Secondary | ICD-10-CM | POA: Diagnosis not present

## 2017-04-26 DIAGNOSIS — R7989 Other specified abnormal findings of blood chemistry: Secondary | ICD-10-CM | POA: Diagnosis not present

## 2017-04-26 DIAGNOSIS — K7581 Nonalcoholic steatohepatitis (NASH): Secondary | ICD-10-CM | POA: Insufficient documentation

## 2017-04-26 DIAGNOSIS — R0609 Other forms of dyspnea: Secondary | ICD-10-CM | POA: Insufficient documentation

## 2017-04-26 DIAGNOSIS — K219 Gastro-esophageal reflux disease without esophagitis: Secondary | ICD-10-CM | POA: Insufficient documentation

## 2017-04-26 DIAGNOSIS — I119 Hypertensive heart disease without heart failure: Secondary | ICD-10-CM | POA: Diagnosis not present

## 2017-04-26 DIAGNOSIS — I081 Rheumatic disorders of both mitral and tricuspid valves: Secondary | ICD-10-CM | POA: Insufficient documentation

## 2017-04-26 LAB — ECHOCARDIOGRAM COMPLETE
AOPV: 0.68 m/s
AV VEL mean LVOT/AV: 0.69
AV peak Index: 0.75
AV vel: 1.56
AVAREAMEANV: 1.57 cm2
AVAREAMEANVIN: 0.76 cm2/m2
AVAREAVTI: 1.55 cm2
AVAREAVTIIND: 0.76 cm2/m2
AVG: 7 mmHg
AVLVOTPG: 6 mmHg
AVPG: 13 mmHg
AVPKVEL: 177 cm/s
CHL CUP DOP CALC LVOT VTI: 26.1 cm
CHL CUP STROKE VOLUME: 35 mL
CHL CUP TV REG PEAK VELOCITY: 228 cm/s
DOP CAL AO MEAN VELOCITY: 120 cm/s
E/e' ratio: 8.55
EWDT: 373 ms
FS: 41 % (ref 28–44)
IVS/LV PW RATIO, ED: 1.31
LA diam index: 1.7 cm/m2
LA vol A4C: 41.7 ml
LASIZE: 35 mm
LAVOL: 41.9 mL
LAVOLIN: 20.4 mL/m2
LDCA: 2.27 cm2
LEFT ATRIUM END SYS DIAM: 35 mm
LV E/e'average: 8.55
LV PW d: 12.5 mm — AB (ref 0.6–1.1)
LV TDI E'LATERAL: 6.42
LV dias vol index: 29 mL/m2
LV dias vol: 60 mL (ref 46–106)
LV e' LATERAL: 6.42 cm/s
LV sys vol index: 12 mL/m2
LVEEMED: 8.55
LVOT SV: 59 mL
LVOT diameter: 17 mm
LVOT peak VTI: 0.69 cm
LVOT peak vel: 121 cm/s
LVSYSVOL: 25 mL
MV Dec: 373
MV pk A vel: 95.9 m/s
MVPKEVEL: 54.9 m/s
RV LATERAL S' VELOCITY: 11.9 cm/s
RV TAPSE: 20.4 mm
RV sys press: 24 mmHg
Simpson's disk: 59
TDI e' medial: 6.2
TRMAXVEL: 228 cm/s
VTI: 37.9 cm
Valve area index: 0.76
Valve area: 1.56 cm2

## 2017-04-26 NOTE — Progress Notes (Signed)
*  PRELIMINARY RESULTS* Echocardiogram 2D Echocardiogram has been performed.  Kristen Mack 04/26/2017, 11:28 AM

## 2017-04-27 ENCOUNTER — Other Ambulatory Visit: Payer: Self-pay | Admitting: *Deleted

## 2017-04-27 DIAGNOSIS — K862 Cyst of pancreas: Secondary | ICD-10-CM

## 2017-05-02 ENCOUNTER — Other Ambulatory Visit: Payer: Self-pay | Admitting: Family Medicine

## 2017-05-02 ENCOUNTER — Other Ambulatory Visit: Payer: Self-pay | Admitting: *Deleted

## 2017-05-02 MED ORDER — ZOSTER VAC RECOMB ADJUVANTED 50 MCG/0.5ML IM SUSR: 0.5000 mL | Freq: Once | INTRAMUSCULAR | 0 refills | Status: AC

## 2017-05-03 ENCOUNTER — Encounter: Payer: Self-pay | Admitting: Family Medicine

## 2017-05-04 ENCOUNTER — Other Ambulatory Visit: Payer: Self-pay | Admitting: Family Medicine

## 2017-05-04 ENCOUNTER — Ambulatory Visit (HOSPITAL_COMMUNITY)
Admission: RE | Admit: 2017-05-04 | Discharge: 2017-05-04 | Disposition: A | Discharge status: Home / Self Care | Payer: PPO | Source: Ambulatory Visit | Attending: Family Medicine | Admitting: Family Medicine

## 2017-05-04 DIAGNOSIS — R160 Hepatomegaly, not elsewhere classified: Secondary | ICD-10-CM | POA: Diagnosis not present

## 2017-05-04 DIAGNOSIS — D3502 Benign neoplasm of left adrenal gland: Secondary | ICD-10-CM | POA: Diagnosis not present

## 2017-05-04 DIAGNOSIS — I700 Atherosclerosis of aorta: Secondary | ICD-10-CM | POA: Insufficient documentation

## 2017-05-04 DIAGNOSIS — K760 Fatty (change of) liver, not elsewhere classified: Secondary | ICD-10-CM | POA: Diagnosis not present

## 2017-05-04 DIAGNOSIS — K862 Cyst of pancreas: Secondary | ICD-10-CM | POA: Diagnosis not present

## 2017-05-04 DIAGNOSIS — D3500 Benign neoplasm of unspecified adrenal gland: Secondary | ICD-10-CM | POA: Diagnosis not present

## 2017-05-04 MED ORDER — GADOBENATE DIMEGLUMINE 529 MG/ML IV SOLN
20.0000 mL | Freq: Once | INTRAVENOUS | Status: AC | PRN
  Administered 2017-05-04: 20 mL via INTRAVENOUS

## 2017-05-05 DIAGNOSIS — R0602 Shortness of breath: Secondary | ICD-10-CM | POA: Diagnosis not present

## 2017-05-05 DIAGNOSIS — G4733 Obstructive sleep apnea (adult) (pediatric): Secondary | ICD-10-CM | POA: Diagnosis not present

## 2017-05-05 LAB — PULMONARY FUNCTION TEST

## 2017-05-07 DIAGNOSIS — G4733 Obstructive sleep apnea (adult) (pediatric): Secondary | ICD-10-CM | POA: Diagnosis not present

## 2017-05-07 DIAGNOSIS — R0602 Shortness of breath: Secondary | ICD-10-CM | POA: Diagnosis not present

## 2017-05-10 ENCOUNTER — Encounter: Payer: Self-pay | Admitting: Family Medicine

## 2017-05-10 ENCOUNTER — Ambulatory Visit (INDEPENDENT_AMBULATORY_CARE_PROVIDER_SITE_OTHER): Payer: PPO | Admitting: Family Medicine

## 2017-05-10 VITALS — BP 136/88 | Ht 61.0 in | Wt 205.0 lb

## 2017-05-10 DIAGNOSIS — D3500 Benign neoplasm of unspecified adrenal gland: Secondary | ICD-10-CM

## 2017-05-10 DIAGNOSIS — K863 Pseudocyst of pancreas: Secondary | ICD-10-CM | POA: Diagnosis not present

## 2017-05-10 DIAGNOSIS — I10 Essential (primary) hypertension: Secondary | ICD-10-CM | POA: Diagnosis not present

## 2017-05-10 NOTE — Progress Notes (Signed)
   Subjective:    Patient ID: Kristen Mack, female    DOB: November 24, 1945, 72 y.o.   MRN: [id number]  HPI Patient is here today to discuss her test results with Dr.Scott. Long discussion held today regarding her MRI of the pancreas as well as her echo of her heart in addition to this we discussed her lab work as well.  Patient does relate fatigue tiredness shortness of breath with activity denies chest tightness pressure pain nausea vomiting diarrhea  Review of Systems  Constitutional: Negative for activity change, fatigue and fever.  HENT: Negative for congestion.   Respiratory: Negative for cough, chest tightness and shortness of breath.   Cardiovascular: Negative for chest pain and leg swelling.  Gastrointestinal: Negative for abdominal pain.  Skin: Negative for color change.  Neurological: Negative for headaches.  Psychiatric/Behavioral: Negative for behavioral problems.       Objective:   Physical Exam  Constitutional: She appears well-developed and well-nourished. No distress.  HENT:  Head: Normocephalic and atraumatic.  Eyes: Right eye exhibits no discharge. Left eye exhibits no discharge.  Neck: No tracheal deviation present.  Cardiovascular: Normal rate, regular rhythm and normal heart sounds.  No murmur heard. Pulmonary/Chest: Effort normal and breath sounds normal. No respiratory distress. She has no wheezes. She has no rales.  Musculoskeletal: She exhibits no edema.  Lymphadenopathy:    She has no cervical adenopathy.  Neurological: She is alert. She exhibits normal muscle tone.  Skin: Skin is warm and dry. No erythema.  Psychiatric: Her behavior is normal.  Vitals reviewed.         Assessment & Plan:  Pancreatic pseudocyst will need a follow-up MRI in 6 months all of this was discussed with the patient  Adrenal adenoma this was discussed with the patient also recommend endocrinology consultation to see if this is a functioning adenoma  Fatty liver with  cirrhosis importance of watching diet losing weight being healthy was recommended lab work again in several months time  Left ventricular hypertrophy along with diastolic dysfunction monitor closely keep blood pressure under good control try to lose weight

## 2017-05-19 ENCOUNTER — Encounter: Payer: Self-pay | Admitting: Family Medicine

## 2017-05-30 ENCOUNTER — Other Ambulatory Visit: Payer: Self-pay | Admitting: *Deleted

## 2017-05-30 MED ORDER — LOSARTAN POTASSIUM 100 MG PO TABS: 100.0000 mg | ORAL_TABLET | Freq: Every day | ORAL | 0 refills | Status: DC

## 2017-05-30 NOTE — Telephone Encounter (Signed)
This was signed as requested-thank you for your assistance

## 2017-05-30 NOTE — Telephone Encounter (Signed)
Please sign & date order for [employer] & supplies (in red folder in yellow box)  The home sleep test results are attached to the order for review  Please forward to me to be faxed with all required documentation

## 2017-06-02 NOTE — Telephone Encounter (Signed)
[employer] order faxed to [facility] & filed

## 2017-06-08 ENCOUNTER — Ambulatory Visit: Payer: PPO | Admitting: Family Medicine

## 2017-06-14 DIAGNOSIS — I10 Essential (primary) hypertension: Secondary | ICD-10-CM | POA: Diagnosis not present

## 2017-06-14 DIAGNOSIS — K7581 Nonalcoholic steatohepatitis (NASH): Secondary | ICD-10-CM | POA: Diagnosis not present

## 2017-06-14 DIAGNOSIS — R7303 Prediabetes: Secondary | ICD-10-CM | POA: Diagnosis not present

## 2017-06-15 LAB — HEPATIC FUNCTION PANEL
ALT: 51 IU/L — AB (ref 0–32)
AST: 44 IU/L — ABNORMAL HIGH (ref 0–40)
Albumin: 4.2 g/dL (ref 3.5–4.8)
Alkaline Phosphatase: 77 IU/L (ref 39–117)
BILIRUBIN, DIRECT: 0.12 mg/dL (ref 0.00–0.40)
Bilirubin Total: 0.4 mg/dL (ref 0.0–1.2)
Total Protein: 7.4 g/dL (ref 6.0–8.5)

## 2017-06-15 LAB — LIPID PANEL
CHOL/HDL RATIO: 3.2 ratio (ref 0.0–4.4)
Cholesterol, Total: 149 mg/dL (ref 100–199)
HDL: 47 mg/dL (ref >39)
LDL CALC: 81 mg/dL (ref 0–99)
TRIGLYCERIDES: 105 mg/dL (ref 0–149)
VLDL Cholesterol Cal: 21 mg/dL (ref 5–40)

## 2017-06-15 LAB — BASIC METABOLIC PANEL
BUN / CREAT RATIO: 21 (ref 12–28)
BUN: 14 mg/dL (ref 8–27)
CO2: 21 mmol/L (ref 20–29)
Calcium: 9.4 mg/dL (ref 8.7–10.3)
Chloride: 106 mmol/L (ref 96–106)
Creatinine, Ser: 0.66 mg/dL (ref 0.57–1.00)
GFR calc Af Amer: 101 mL/min/{1.73_m2} (ref >59)
GFR, EST NON AFRICAN AMERICAN: 88 mL/min/{1.73_m2} (ref >59)
Glucose: 130 mg/dL — ABNORMAL HIGH (ref 65–99)
POTASSIUM: 4.4 mmol/L (ref 3.5–5.2)
SODIUM: 143 mmol/L (ref 134–144)

## 2017-06-15 LAB — HEMOGLOBIN A1C
Est. average glucose Bld gHb Est-mCnc: 137 mg/dL
HEMOGLOBIN A1C: 6.4 % — AB (ref 4.8–5.6)

## 2017-06-16 ENCOUNTER — Encounter: Payer: Self-pay | Admitting: "Endocrinology

## 2017-06-16 ENCOUNTER — Ambulatory Visit (INDEPENDENT_AMBULATORY_CARE_PROVIDER_SITE_OTHER): Payer: PPO | Admitting: "Endocrinology

## 2017-06-16 VITALS — BP 144/82 | HR 86 | Ht 61.0 in | Wt 206.0 lb

## 2017-06-16 DIAGNOSIS — D3502 Benign neoplasm of left adrenal gland: Secondary | ICD-10-CM

## 2017-06-16 NOTE — Progress Notes (Signed)
Endocrinology Consult Note                                            06/16/2017, 6:32 PM   Subjective:    Patient ID: Kristen Mack, female    DOB: [birth year], PCP Kathyrn Drown, MD   Past Medical History:  Diagnosis Date  . Arthritis   . Asthma   . CFS (chronic fatigue syndrome)   . Cracked tooth    X2  RIGHT LOWER MOLAR AND LEFT UPPER MOLAR  . Fatty liver disease, nonalcoholic   . Fibromyalgia   . GERD (gastroesophageal reflux disease)   . History of adenomatous polyp of colon   . History of cardiac murmur    until age 67  . History of TIA (transient ischemic attack)    per CT scan   . Hypertension   . Hypothyroidism   . IBS (irritable bowel syndrome)   . Meniere disease   . OSA (obstructive sleep apnea)    CPAP intolerate -- per study 2010  moderate osa--  STATES HAS RAISED HOB  . Osteopenia   . PMB (postmenopausal bleeding)   . Prediabetes   . Tooth loose    LOWER FRONT   Past Surgical History:  Procedure Laterality Date  . [place]  . CHOLECYSTECTOMY  1995   and  REPAIR UMBILICAL HERNIA  . COLONOSCOPY N/A 04/19/2013   Procedure: COLONOSCOPY;  Surgeon: Rogene Houston, MD;  Location: [facility];  Service: Endoscopy;  Laterality: N/A;    . ESOPHAGOGASTRODUODENOSCOPY  02-08-2003  . HYSTEROSCOPY W/D&C  11-14-2002   endometrial polypectomy  . HYSTEROSCOPY W/D&C N/A 04/05/2014   Procedure: DILATATION AND CURETTAGE /HYSTEROSCOPY, ENDOMETRIAL POLYPECTOMY;  Surgeon: Sanjuana Kava, MD;  Location: [facility];  Service: Gynecology;  Laterality: N/A;  . LIVER BIOPSY  01-29-2004   benign   Social History   Socioeconomic History  . Marital status: Married    Spouse name: Not on file  . Number of children: 2  . Years of education: Not on file  . Highest education level: Not on file  Occupational History  . Occupation: retired, Animal nutritionist  . Financial resource strain: Not on file  . Food insecurity:   Worry: Not on file    Inability: Not on file  . Transportation needs:    Medical: Not on file    Non-medical: Not on file  Tobacco Use  . Smoking status: Former Smoker    Packs/day: 0.50    Years: 3.00    Pack years: 1.50    Types: Cigarettes    Last attempt to quit: 10/03/1968    Years since quitting: 48.7  . Smokeless tobacco: Never Used  Substance and Sexual Activity  . Alcohol use: No    Alcohol/week: 0.0 oz  . Drug use: No  . Sexual activity: Not on file  Lifestyle  . Physical activity:    Days per week: Not on file    Minutes per session: Not on file  . Stress: Not on file  Relationships  . Social connections:    Talks on phone: Not on file    Gets together: Not on file    Attends religious service: Not on file    Active member of club or organization: Not on file  Attends meetings of clubs or organizations: Not on file    Relationship status: Not on file  Other Topics Concern  . Not on file  Social History Narrative  . Not on file   Outpatient Encounter Medications as of 06/16/2017  Medication Sig  . albuterol (PROVENTIL HFA;VENTOLIN HFA) 108 (90 Base) MCG/ACT inhaler Inhale 2 puffs into lungs every 6 hours as needed for shortness of breath.  . AMBULATORY NON FORMULARY MEDICATION Medication Name: Liver Essentials- Take 2 capsules by mouth 2-3 times daily  . amLODipine (NORVASC) 5 MG tablet TAKE 1 TABLET BY MOUTH DAILY  . B Complex Vitamins (B COMPLEX 100 PO) Take 1 tablet by mouth daily.  Jolyne Loa Grape-Goldenseal (BERBERINE COMPLEX PO) Take by mouth. 455m. Takes 3 tabs 2 -3 times a day  . buPROPion (WELLBUTRIN SR) 150 MG 12 hr tablet Take 1 tablet (150 mg total) by mouth 2 (two) times daily.  . cetirizine (ZYRTEC) 10 MG tablet Take 10 mg by mouth daily as needed.   . Cholecalciferol (VITAMIN D) 2000 UNITS CAPS Take by mouth.  . Chromium 1000 MCG TABS Take 1 tablet by mouth daily.   .Marland KitchenCINNAMON PO Take by mouth.  . Coenzyme Q10 (COQ10) 200 MG CAPS Take 1  capsule by mouth daily.  . Cyanocobalamin (B-12) 1000 MCG SUBL Place 1 tablet under the tongue daily.   .Marland KitchenDIGESTIVE ENZYMES PO Take 1 tablet by mouth 3 (three) times daily.   . fluticasone (FLONASE) 50 MCG/ACT nasal spray USE TWO SPRAYS IN EACH NOSTRIL ONCE DAILY FOR CONGESTION  . ibuprofen (ADVIL,MOTRIN) 200 MG tablet Take 200-400 mg by mouth every 6 (six) hours as needed. For pain  . ipratropium (ATROVENT) 0.02 % nebulizer solution USE ONE VIAL IN NEBULIZER 4 TIMES DAILY AS NEEDED FOR SHORTNESS OF BREATH  . levothyroxine (SYNTHROID, LEVOTHROID) 150 MCG tablet TAKE 1 TABLET BY MOUTH ONCE DAILY  . loratadine (LORADAMED) 10 MG tablet Take 10 mg by mouth daily.  .Marland Kitchenlosartan (COZAAR) 100 MG tablet Take 1 tablet (100 mg total) by mouth daily.  .Marland KitchenLysine HCl 500 MG TABS Take 1 tablet by mouth daily. Take as needed  . Magnesium 250 MG TABS Take by mouth 2 (two) times daily.  . meclizine (ANTIVERT) 50 MG tablet Take 50 mg by mouth daily as needed for dizziness. Take as needed  . Melatonin 10 MG CAPS Take by mouth at bedtime.  . montelukast (SINGULAIR) 10 MG tablet TAKE ONE TABLET BY MOUTH AT BEDTIME  . ONETOUCH VERIO test strip Test blood sugar once daily  . OVER THE COUNTER MEDICATION Tumeric plus beperrine. Once daily.  .Marland KitchenOVER THE COUNTER MEDICATION Fennel seeds. One tsp with meals  . potassium chloride SA (KLOR-CON M20) 20 MEQ tablet Take 1 tablet (20 mEq total) by mouth daily.  .Marland KitchenVITAMIN A PO Take 10,000 mcg by mouth daily.   . vitamin C (ASCORBIC ACID) 500 MG tablet Take 6,000 mg by mouth daily.   . vitamin E 100 UNIT capsule Take 400 Units by mouth daily.    No facility-administered encounter medications on file as of 06/16/2017.    ALLERGIES: Allergies  Allergen Reactions  . Ambien [Zolpidem Tartrate] Other (See Comments)    Side effect "last a couple days"  . Cefzil [Cefprozil]     diarrhea  . Codeine Itching and Nausea And Vomiting  . Demerol [Meperidine] Other (See Comments)     "messes up senses"  . Formaldehyde   . Latex Other (See Comments)    "  skin gets raw"  . Sulfa Antibiotics Other (See Comments)    Severe abd cramp  . Celexa [Citalopram Hydrobromide] Other (See Comments)    Mouth sores    VACCINATION STATUS: Immunization History  Administered Date(s) Administered  . Influenza,inj,Quad PF,6+ Mos 03/02/2016, 03/03/2017  . Pneumococcal Conjugate-13 05/22/2014  . Pneumococcal Polysaccharide-23 03/03/2017    HPI Kristen Mack is 72 y.o. female who presents today with a medical history as above. she is being seen in consultation for incidental 1.5 cm left adrenal adenoma requested by Kathyrn Drown, MD. -Patient denies any prior history of adrenal dysfunction or lesion. -She is known to have pancreatic pseudocyst for some time.  On surveillance MRI of the abdomen to follow-up with the pancreatic pseudocyst on May 04, 2017 she was found to have 1.5 cm left adrenal adenoma. -She did not have subsequent functional workup. -She denies history of difficult to control hypertension, in fact has history of orthostatic hypotension.  She denies spells of palpitations, headaches, sweating. -Denies rapid weight change lately, she has prediabetes on no medications.  She has hypothyroidism induced by Hashimoto's thyroiditis currently on levothyroxine 150 mcg p.o. every morning. -She denies close  family history of pituitary, adrenal dysfunctions.  Review of Systems  Constitutional: + Steady body weight , no fatigue, no subjective hyperthermia, no subjective hypothermia Eyes: no blurry vision, no xerophthalmia ENT: no sore throat, no nodules palpated in throat, no dysphagia/odynophagia, no hoarseness Cardiovascular: no Chest Pain, no Shortness of Breath, no palpitations, no leg swelling Respiratory: no cough, no SOB Gastrointestinal: no Nausea/Vomiting/Diarhhea Musculoskeletal: no muscle/joint aches Skin: no rashes Neurological: no tremors, no numbness,  no tingling, no dizziness Psychiatric: no depression, no anxiety  Objective:    BP (!) 144/82   Pulse 86   Ht 5' 1"  (1.549 m)   Wt 206 lb (93.4 kg)   BMI 38.92 kg/m   Wt Readings from Last 3 Encounters:  06/16/17 206 lb (93.4 kg)  05/10/17 205 lb (93 kg)  04/19/17 206 lb 9.6 oz (93.7 kg)    Physical Exam  Constitutional: + Obese for height, not in acute distress, normal state of mind Eyes: PERRLA, EOMI, no exophthalmos ENT: moist mucous membranes, no thyromegaly, no cervical lymphadenopathy Cardiovascular: normal precordial activity, Regular Rate and Rhythm, no Murmur/Rubs/Gallops Respiratory:  adequate breathing efforts, no gross chest deformity, Clear to auscultation bilaterally Gastrointestinal: abdomen soft, Non -tender, No distension, Bowel Sounds present Musculoskeletal: no gross deformities, strength intact in all four extremities Skin: moist, warm, no rashes Neurological: no tremor with outstretched hands, Deep tendon reflexes normal in all four extremities.  CMP ( most recent) CMP     Component Value Date/Time   NA 143 06/14/2017 1202   K 4.4 06/14/2017 1202   CL 106 06/14/2017 1202   CO2 21 06/14/2017 1202   GLUCOSE 130 (H) 06/14/2017 1202   GLUCOSE 130 (H) 03/11/2017 1330   BUN 14 06/14/2017 1202   CREATININE 0.66 06/14/2017 1202   CREATININE 0.67 05/07/2014 1122   CALCIUM 9.4 06/14/2017 1202   PROT 7.4 06/14/2017 1202   ALBUMIN 4.2 06/14/2017 1202   AST 44 (H) 06/14/2017 1202   ALT 51 (H) 06/14/2017 1202   ALKPHOS 77 06/14/2017 1202   BILITOT 0.4 06/14/2017 1202   GFRNONAA 88 06/14/2017 1202   GFRAA 101 06/14/2017 1202     Diabetic Labs (most recent): Lab Results  Component Value Date   HGBA1C 6.4 (H) 06/14/2017   HGBA1C 6.2 (H) 12/31/2016   HGBA1C 6.0 (  H) 08/11/2016     Lipid Panel ( most recent) Lipid Panel     Component Value Date/Time   CHOL 149 06/14/2017 1202   TRIG 105 06/14/2017 1202   HDL 47 06/14/2017 1202   CHOLHDL 3.2  06/14/2017 1202   CHOLHDL 3.3 05/07/2014 1122   VLDL 24 05/07/2014 1122   LDLCALC 81 06/14/2017 1202      Lab Results  Component Value Date   TSH 0.492 12/31/2016   TSH 6.330 (H) 10/23/2016   TSH 2.560 02/28/2016   TSH 0.647 04/01/2015   TSH 1.890 10/01/2014   TSH 2.301 06/21/2014   TSH 8.870 (H) 05/07/2014   TSH 2.228 08/09/2013   TSH 0.786 03/07/2013   TSH 11.229 (H) 12/21/2012   FREET4 1.67 12/31/2016   FREET4 1.12 10/01/2014   FREET4 1.10 06/21/2014   FREET4 1.25 03/07/2013   FREET4 1.21 10/23/2012        MRI of abdomen on May 04, 2017:  Normal right adrenal gland. Left adrenal nodule on the order of 1.5 cm is consistent with an adenoma.  Assessment & Plan:   1. Adenoma of left adrenal gland - Kristen Mack  is being seen at a kind request of Luking, Elayne Snare, MD. - I have reviewed her available records on her adrenal incidentaloma and clinically evaluated the patient. -She does not have sufficient information /workup to proceed with any  treatment plan. -The radiology descriptions of the left adrenal adenoma is that of none suspicious for malignancy. -however, it warrants functional study to rule out pheochromocytoma, hypercortisolism, hyperaldosteronism. -For this she would be considered for 24-hour urine studies for catecholamines, metanephrines, cortisol, and aldosterone.  -Her subsequent plan of action will depend on her lab findings. -If the adenoma is not active, she will be put on observation plan, with next abdominal MRI in 6-12 months.  - I did not initiate any new prescriptions today. - I advised patient to maintain close follow up with Kathyrn Drown, MD for primary care needs.   - Time spent with the patient: 45 minutes, of which >50% was spent in obtaining information about her symptoms, reviewing her previous labs, evaluations, and treatments, and discussing about her left adrenal incidentaloma  and developing a plan to confirm the  diagnosis and long term treatment as necessary.  [name] participated in the discussions, expressed understanding, and voiced agreement with the above plans.  All questions were answered to her satisfaction. she is encouraged to contact clinic should she have any questions or concerns prior to her return visit.  Follow up plan: Return in about 2 weeks (around 06/30/2017) for with 24 hour urine studies.   Glade Lloyd, MD [facility] [address] [place], [place] [address] Phone: [phone]  Fax: [phone]     06/16/2017, 6:32 PM  This note was partially dictated with voice recognition software. Similar sounding words can be transcribed inadequately or may not  be corrected upon review.

## 2017-06-20 ENCOUNTER — Encounter: Payer: Self-pay | Admitting: Family Medicine

## 2017-06-21 NOTE — Telephone Encounter (Signed)
Nurses- we will be fine for the patient to cancel her appointment for this Friday.  Also the patient may do a follow-up office visit in July. Please also talk with the patient and find out how we can help her regarding sleep study.  Ask her what specifically she is waiting on or needing then please let our office to do everything possible to figure this out.  Let me know specifically what I need to review or do.  If they can be handled via nurses please do so.  Thank you for all your help

## 2017-06-22 ENCOUNTER — Other Ambulatory Visit: Payer: Self-pay | Admitting: "Endocrinology

## 2017-06-22 DIAGNOSIS — D3502 Benign neoplasm of left adrenal gland: Secondary | ICD-10-CM | POA: Diagnosis not present

## 2017-06-22 NOTE — Telephone Encounter (Signed)
Called pt. Canceled appt for this Friday and told her to follow up in July. Pt states she had at home sleep study in February and they told her it would be about 3 weeks to get results to her doctor. She wants to know the results. Wants to know what her oxygens levels though the night were, how many times a night she stopped breathing, if she needs a cpap or oxygen. Did not see the report in Epic. Got Brendale to help me track down the report. Copy of report put in dr scott's folder. brendale states cpap has been ordered.

## 2017-06-24 ENCOUNTER — Ambulatory Visit: Payer: PPO | Admitting: Family Medicine

## 2017-06-26 LAB — ALDOSTERONE, URINE
ALDOSTERONE 24H UR: 12.74 ug/(24.h) (ref 0.00–19.00)
Aldosterone U,Random: 6.37 ug/L

## 2017-06-26 LAB — CREATININE, URINE, 24 HOUR
CREATININE, UR: 32.9 mg/dL
Creatinine, 24H Ur: 658 mg/24 hr — ABNORMAL LOW (ref 800–1800)

## 2017-06-26 LAB — METANEPHRINES, URINE, 24 HOUR
Metaneph Total, Ur: 23 ug/L
Metanephrines, 24H Ur: 46 ug/(24.h) (ref 45–290)
Normetanephrine, 24H Ur: 228 ug/(24.h) (ref 82–500)
Normetanephrine, Ur: 114 ug/L

## 2017-06-26 LAB — CORTISOL, URINE, FREE
Cortisol (Ur), Free: 26 ug/24 hr (ref 6–42)
Cortisol,F,ug/L,U: 13 ug/L

## 2017-06-27 ENCOUNTER — Encounter: Payer: Self-pay | Admitting: Family Medicine

## 2017-06-27 LAB — CATECHOLAMINES, FRACTIONATED, URINE, 24 HOUR
Dopamine , 24H Ur: 200 ug/24 hr (ref 0–510)
Dopamine, Rand Ur: 95 ug/L
Epinephrine, 24H Ur: <2 ug/24 hr (ref 0–20)
NOREPINEPHRINE 24H UR: 42 ug/(24.h) (ref 0–135)
Norepinephrine, Rand Ur: 20 ug/L

## 2017-06-28 ENCOUNTER — Other Ambulatory Visit: Payer: Self-pay | Admitting: Family Medicine

## 2017-07-05 ENCOUNTER — Encounter: Payer: Self-pay | Admitting: "Endocrinology

## 2017-07-05 ENCOUNTER — Ambulatory Visit (INDEPENDENT_AMBULATORY_CARE_PROVIDER_SITE_OTHER): Payer: PPO | Admitting: "Endocrinology

## 2017-07-05 VITALS — BP 140/84 | HR 81 | Ht 61.0 in | Wt 208.0 lb

## 2017-07-05 DIAGNOSIS — D3502 Benign neoplasm of left adrenal gland: Secondary | ICD-10-CM | POA: Diagnosis not present

## 2017-07-05 NOTE — Progress Notes (Signed)
Endocrinology follow-up note                                            07/05/2017, 4:47 PM   Subjective:    Patient ID: Kristen Mack, female    DOB: Feb 03, 1946, PCP Kathyrn Drown, MD   Past Medical History:  Diagnosis Date  . Arthritis   . Asthma   . CFS (chronic fatigue syndrome)   . Cracked tooth    X2  RIGHT LOWER MOLAR AND LEFT UPPER MOLAR  . Fatty liver disease, nonalcoholic   . Fibromyalgia   . GERD (gastroesophageal reflux disease)   . History of adenomatous polyp of colon   . History of cardiac murmur    until age 52  . History of TIA (transient ischemic attack)    per CT scan   . Hypertension   . Hypothyroidism   . IBS (irritable bowel syndrome)   . Meniere disease   . OSA (obstructive sleep apnea)    CPAP intolerate -- per study 2010  moderate osa--  STATES HAS RAISED HOB  . Osteopenia   . PMB (postmenopausal bleeding)   . Prediabetes   . Tooth loose    LOWER FRONT   Past Surgical History:  Procedure Laterality Date  . [place]  . CHOLECYSTECTOMY  1995   and  REPAIR UMBILICAL HERNIA  . COLONOSCOPY N/A 04/19/2013   Procedure: COLONOSCOPY;  Surgeon: Rogene Houston, MD;  Location: [facility];  Service: Endoscopy;  Laterality: N/A;    . ESOPHAGOGASTRODUODENOSCOPY  02-08-2003  . HYSTEROSCOPY W/D&C  11-14-2002   endometrial polypectomy  . HYSTEROSCOPY W/D&C N/A 04/05/2014   Procedure: DILATATION AND CURETTAGE /HYSTEROSCOPY, ENDOMETRIAL POLYPECTOMY;  Surgeon: Sanjuana Kava, MD;  Location: [facility];  Service: Gynecology;  Laterality: N/A;  . LIVER BIOPSY  01-29-2004   benign   Social History   Socioeconomic History  . Marital status: Married    Spouse name: Not on file  . Number of children: 2  . Years of education: Not on file  . Highest education level: Not on file  Occupational History  . Occupation: retired, Animal nutritionist  . Financial resource strain: Not on file  . Food insecurity:     Worry: Not on file    Inability: Not on file  . Transportation needs:    Medical: Not on file    Non-medical: Not on file  Tobacco Use  . Smoking status: Former Smoker    Packs/day: 0.50    Years: 3.00    Pack years: 1.50    Types: Cigarettes    Last attempt to quit: 10/03/1968    Years since quitting: 48.7  . Smokeless tobacco: Never Used  Substance and Sexual Activity  . Alcohol use: No    Alcohol/week: 0.0 oz  . Drug use: No  . Sexual activity: Not on file  Lifestyle  . Physical activity:    Days per week: Not on file    Minutes per session: Not on file  . Stress: Not on file  Relationships  . Social connections:    Talks on phone: Not on file    Gets together: Not on file    Attends religious service: Not on file    Active member of club or organization: Not  on file    Attends meetings of clubs or organizations: Not on file    Relationship status: Not on file  Other Topics Concern  . Not on file  Social History Narrative  . Not on file   Outpatient Encounter Medications as of 07/05/2017  Medication Sig  . albuterol (PROVENTIL HFA;VENTOLIN HFA) 108 (90 Base) MCG/ACT inhaler Inhale 2 puffs into lungs every 6 hours as needed for shortness of breath.  . AMBULATORY NON FORMULARY MEDICATION Medication Name: Liver Essentials- Take 2 capsules by mouth 2-3 times daily  . amLODipine (NORVASC) 5 MG tablet TAKE 1 TABLET BY MOUTH DAILY  . B Complex Vitamins (B COMPLEX 100 PO) Take 1 tablet by mouth daily.  Jolyne Loa Grape-Goldenseal (BERBERINE COMPLEX PO) Take by mouth. 484m. Takes 3 tabs 2 -3 times a day  . buPROPion (WELLBUTRIN SR) 150 MG 12 hr tablet Take 1 tablet (150 mg total) by mouth 2 (two) times daily.  . cetirizine (ZYRTEC) 10 MG tablet Take 10 mg by mouth daily as needed.   . Cholecalciferol (VITAMIN D) 2000 UNITS CAPS Take by mouth.  . Chromium 1000 MCG TABS Take 1 tablet by mouth daily.   .Marland KitchenCINNAMON PO Take by mouth.  . Coenzyme Q10 (COQ10) 200 MG CAPS Take 1  capsule by mouth daily.  . Cyanocobalamin (B-12) 1000 MCG SUBL Place 1 tablet under the tongue daily.   .Marland KitchenDIGESTIVE ENZYMES PO Take 1 tablet by mouth 3 (three) times daily.   . fluticasone (FLONASE) 50 MCG/ACT nasal spray USE TWO SPRAYS IN EACH NOSTRIL ONCE DAILY FOR CONGESTION  . ibuprofen (ADVIL,MOTRIN) 200 MG tablet Take 200-400 mg by mouth every 6 (six) hours as needed. For pain  . ipratropium (ATROVENT) 0.02 % nebulizer solution USE ONE VIAL IN NEBULIZER 4 TIMES DAILY AS NEEDED FOR SHORTNESS OF BREATH  . levothyroxine (SYNTHROID, LEVOTHROID) 150 MCG tablet TAKE 1 TABLET BY MOUTH ONCE DAILY  . loratadine (LORADAMED) 10 MG tablet Take 10 mg by mouth daily.  .Marland Kitchenlosartan (COZAAR) 100 MG tablet Take 1 tablet (100 mg total) by mouth daily.  .Marland KitchenLysine HCl 500 MG TABS Take 1 tablet by mouth daily. Take as needed  . Magnesium 250 MG TABS Take by mouth 2 (two) times daily.  . meclizine (ANTIVERT) 50 MG tablet Take 50 mg by mouth daily as needed for dizziness. Take as needed  . Melatonin 10 MG CAPS Take by mouth at bedtime.  . montelukast (SINGULAIR) 10 MG tablet TAKE ONE TABLET BY MOUTH AT BEDTIME  . ONETOUCH VERIO test strip Test once daily  . OVER THE COUNTER MEDICATION Tumeric plus beperrine. Once daily.  .Marland KitchenOVER THE COUNTER MEDICATION Fennel seeds. One tsp with meals  . potassium chloride SA (KLOR-CON M20) 20 MEQ tablet Take 1 tablet (20 mEq total) by mouth daily.  .Marland KitchenVITAMIN A PO Take 10,000 mcg by mouth daily.   . vitamin C (ASCORBIC ACID) 500 MG tablet Take 6,000 mg by mouth daily.   . vitamin E 100 UNIT capsule Take 400 Units by mouth daily.    No facility-administered encounter medications on file as of 07/05/2017.    ALLERGIES: Allergies  Allergen Reactions  . Ambien [Zolpidem Tartrate] Other (See Comments)    Side effect "last a couple days"  . Cefzil [Cefprozil]     diarrhea  . Codeine Itching and Nausea And Vomiting  . Demerol [Meperidine] Other (See Comments)    "messes up senses"   . Formaldehyde   . Latex  Other (See Comments)    "skin gets raw"  . Sulfa Antibiotics Other (See Comments)    Severe abd cramp  . Celexa [Citalopram Hydrobromide] Other (See Comments)    Mouth sores    VACCINATION STATUS: Immunization History  Administered Date(s) Administered  . Influenza,inj,Quad PF,6+ Mos 03/02/2016, 03/03/2017  . Pneumococcal Conjugate-13 05/22/2014  . Pneumococcal Polysaccharide-23 03/03/2017    HPI Kristen Mack is 72 y.o. female who presents today with a medical history as above. she is returning with the results of her 24-hour urine studies after she was seen in consultation for  incidental 1.5 cm left adrenal adenoma requested by Kathyrn Drown, MD.   -Patient denies any prior history of adrenal dysfunction or lesion. -She is known to have pancreatic pseudocyst for some time.  On surveillance MRI of the abdomen to follow-up with the pancreatic pseudocyst on May 04, 2017 she was found to have 1.5 cm left adrenal adenoma. -Her 24-hour urine function studies are all within normal limits.  Denies any new complaints since last visit. -She denies history of difficult to control hypertension, in fact has history of orthostatic hypotension.  She denies spells of palpitations, headaches, sweating. -Denies rapid weight change lately, she has prediabetes on no medications.  She has hypothyroidism induced by Hashimoto's thyroiditis currently on levothyroxine 150 mcg p.o. every morning. -She denies close  family history of pituitary, adrenal dysfunctions.  Review of Systems  Constitutional: + Steady body weight , no fatigue, no subjective hyperthermia nor hyponatremia.    Eyes: no blurry vision, no xerophthalmia ENT: no sore throat, no nodules palpated in throat, no dysphagia/odynophagia, no hoarseness Cardiovascular: no Chest Pain, no Shortness of Breath, no palpitations, no leg swelling Respiratory: no cough, no SOB Gastrointestinal: no  Nausea/Vomiting/Diarhhea Musculoskeletal: no muscle/joint aches Skin: no rashes Neurological: no tremors, no numbness, no tingling, no dizziness Psychiatric: no depression, no anxiety  Objective:    BP 140/84   Pulse 81   Ht 5' 1"  (1.549 m)   Wt 208 lb (94.3 kg)   BMI 39.30 kg/m   Wt Readings from Last 3 Encounters:  07/05/17 208 lb (94.3 kg)  06/16/17 206 lb (93.4 kg)  05/10/17 205 lb (93 kg)    Physical Exam  Constitutional: + Obese for height, not in acute distress, alert and oriented x3.   Eyes: PERRLA, EOMI, no exophthalmos ENT: moist mucous membranes, no thyromegaly, no cervical lymphadenopathy Musculoskeletal: no gross deformities, strength intact in all four extremities Skin: moist, warm, no rashes Neurological: no tremor with outstretched hands, Deep tendon reflexes normal in all four extremities.   Recent Results (from the past 2160 hour(s))  Basic metabolic panel     Status: Abnormal   Collection Time: 04/12/17  4:15 PM  Result Value Ref Range   Glucose 116 (H) 65 - 99 mg/dL   BUN 14 8 - 27 mg/dL   Creatinine, Ser 0.69 0.57 - 1.00 mg/dL   GFR calc non Af Amer 87 >59 mL/min/1.73   GFR calc Af Amer 101 >59 mL/min/1.73   BUN/Creatinine Ratio 20 12 - 28   Sodium 141 134 - 144 mmol/L   Potassium 4.4 3.5 - 5.2 mmol/L   Chloride 104 96 - 106 mmol/L   CO2 22 20 - 29 mmol/L   Calcium 9.3 8.7 - 10.3 mg/dL  ECHOCARDIOGRAM COMPLETE     Status: Abnormal   Collection Time: 04/26/17 11:28 AM  Result Value Ref Range   LV PW d 12.5 (A) 0.6 - 1.1  mm   FS 41 28 - 44 %   LA ID, A-P, ES 35 mm   LA diam end sys 35.00 mm   LA vol 41.9 mL   LA vol index 20.4 mL/m2   LV sys vol 25 mL   LV sys vol index 12.0 mL/m2   LV dias vol 60 46 - 106 mL   LV dias vol index 29.0 mL/m2   Simpson's disk 59.00    IVS/LV PW RATIO, ED 1.31    Stroke v 35 ml   LVOT diameter 17 mm   AV vel 1.56    LVOT VTI 26.1 cm   Reg peak vel 228 cm/s   RV sys press 24 mmHg   LV e' LATERAL 6.42 cm/s    LV E/e' medial 8.55    LV E/e'average 8.55    AV pk vel 177 cm/s   AV Area VTI index 0.76 cm2/m2   AV Area VTI 1.55 cm2   AV VEL mean LVOT/AV 0.69    AV Area mean vel 1.57 cm2   AV area mean vel ind 0.76 cm2/m2   LA diam index 1.7 cm/m2   LA vol A4C 41.7 ml   Mean grad 7 mmHg   Valve area 1.56 cm2   LVOT peak grad rest 6 mmHg   E decel time 373 msec   LVOT area 2.27 cm2   LVOT peak vel 121 cm/s   LVOT peak VTI 0.69 cm   Ao pk vel 0.68 m/s   VTI 37.9 cm   LVOT SV 59.00 mL   Peak grad 13 mmHg   E/e' ratio 8.55    AO mean calculated velocity dopler 120 cm/s   MV pk E vel 54.9 m/s   TR max vel 228 cm/s   MV pk A vel 95.9 m/s   Valve area index 0.76    AV peak Index 0.75    MV Dec 373    TDI e' medial 6.20    TDI e' lateral 6.42    Lateral S' vel 11.90 cm/sec   TAPSE 20.40 mm  Pulmonary Function Test     Status: None   Collection Time: 05/05/17  9:48 AM  Result Value Ref Range   FEV1  liters   FVC  liters   FEV1/FVC  %   TLC     DLCO  ml/mmHg sec  Lipid panel     Status: None   Collection Time: 06/14/17 12:02 PM  Result Value Ref Range   Cholesterol, Total 149 100 - 199 mg/dL   Triglycerides 105 0 - 149 mg/dL   HDL 47 >39 mg/dL   VLDL Cholesterol Cal 21 5 - 40 mg/dL   LDL Calculated 81 0 - 99 mg/dL   Chol/HDL Ratio 3.2 0.0 - 4.4 ratio    Comment:                                   T. Chol/HDL Ratio                                             Men  Women                               1/2  Avg.Risk  3.4    3.3                                   Avg.Risk  5.0    4.4                                2X Avg.Risk  9.6    7.1                                3X Avg.Risk 23.4   11.0   Hepatic function panel     Status: Abnormal   Collection Time: 06/14/17 12:02 PM  Result Value Ref Range   Total Protein 7.4 6.0 - 8.5 g/dL   Albumin 4.2 3.5 - 4.8 g/dL   Bilirubin Total 0.4 0.0 - 1.2 mg/dL   Bilirubin, Direct 0.12 0.00 - 0.40 mg/dL   Alkaline Phosphatase 77 39 - 117 IU/L    AST 44 (H) 0 - 40 IU/L   ALT 51 (H) 0 - 32 IU/L  Hemoglobin A1c     Status: Abnormal   Collection Time: 06/14/17 12:02 PM  Result Value Ref Range   Hgb A1c MFr Bld 6.4 (H) 4.8 - 5.6 %    Comment:          Prediabetes: 5.7 - 6.4          Diabetes: >6.4          Glycemic control for adults with diabetes: <7.0    Est. average glucose Bld gHb Est-mCnc 137 mg/dL  Basic metabolic panel     Status: Abnormal   Collection Time: 06/14/17 12:02 PM  Result Value Ref Range   Glucose 130 (H) 65 - 99 mg/dL   BUN 14 8 - 27 mg/dL   Creatinine, Ser 0.66 0.57 - 1.00 mg/dL   GFR calc non Af Amer 88 >59 mL/min/1.73   GFR calc Af Amer 101 >59 mL/min/1.73   BUN/Creatinine Ratio 21 12 - 28   Sodium 143 134 - 144 mmol/L   Potassium 4.4 3.5 - 5.2 mmol/L   Chloride 106 96 - 106 mmol/L   CO2 21 20 - 29 mmol/L   Calcium 9.4 8.7 - 10.3 mg/dL  Metanephrines, urine, 24 hour     Status: None   Collection Time: 06/22/17  3:03 PM  Result Value Ref Range   Normetanephrine, Ur 114 Undefined ug/L    Comment: This test was developed and its performance characteristics determined by [facility]. It has not been cleared or approved by the Food and Drug Administration.    Normetanephrine, 24H Ur 228 82 - 500 ug/24 hr    Comment:      (Hypertensive) >17 years 11 months:    110 - 1050   Metaneph Total, Ur 23 Undefined ug/L    Comment: This test was developed and its performance characteristics determined by [facility]. It has not been cleared or approved by the Food and Drug Administration.    Metanephrines, 24H Ur 46 45 - 290 ug/24 hr    Comment:      (Hypertensive) >17 years 11 months:     35 -  460  Creatinine, urine, 24 hour     Status: Abnormal   Collection Time: 06/22/17  3:03 PM  Result Value Ref Range  Creatinine, Urine 32.9 Not Estab. mg/dL   Creatinine, 24H Ur 658 (L) 800 - 1,800 mg/24 hr  Aldosterone, Urine     Status: None   Collection Time: 06/22/17  3:03 PM  Result Value Ref Range   Aldosterone  U,Random 6.37 Not Estab. ug/L    Comment: This test was developed and its performance characteristics determined by [facility]. It has not been cleared or approved by the Food and Drug Administration.    Aldosterone, 24H Ur 12.74 0.00 - 19.00 ug/24 hr    Comment:                                  Adult Ranges                     Low Sodium Intake     20.00 - 80.00                     Normal Sodium Intake   0.00 - 19.00                     High Sodium Intake     0.00 - 12.00   Cortisol, urine, free     Status: None   Collection Time: 06/22/17  3:03 PM  Result Value Ref Range   Cortisol,F,ug/L,U 13 Undefined ug/L    Comment: This test was developed and its performance characteristics determined by [facility]. It has not been cleared or approved by the Food and Drug Administration.    Cortisol (Ur), Free 26 6 - 42 ug/24 hr    Comment:               **Please note reference interval change**  Catecholamines, fractionated, urine, 24 hour     Status: None   Collection Time: 06/22/17  3:19 PM  Result Value Ref Range   Epinephrine, Rand Ur <1 Undefined ug/L    Comment: This test was developed and its performance characteristics determined by [facility]. It has not been cleared or approved by the Food and Drug Administration.    Epinephrine, 24H Ur <2 0 - 20 ug/24 hr   Norepinephrine, Rand Ur 20 Undefined ug/L    Comment: This test was developed and its performance characteristics determined by [facility]. It has not been cleared or approved by the Food and Drug Administration.    Norepinephrine, 24H Ur 42 0 - 135 ug/24 hr   Dopamine, Rand Ur 95 Undefined ug/L    Comment: This test was developed and its performance characteristics determined by [facility]. It has not been cleared or approved by the Food and Drug Administration.    Dopamine , 24H Ur 200 0 - 510 ug/24 hr    Diabetic Labs (most recent): Lab Results  Component Value Date   HGBA1C 6.4 (H) 06/14/2017   HGBA1C 6.2 (H) 12/31/2016    HGBA1C 6.0 (H) 08/11/2016     Lipid Panel ( most recent) Lipid Panel     Component Value Date/Time   CHOL 149 06/14/2017 1202   TRIG 105 06/14/2017 1202   HDL 47 06/14/2017 1202   CHOLHDL 3.2 06/14/2017 1202   CHOLHDL 3.3 05/07/2014 1122   VLDL 24 05/07/2014 1122   LDLCALC 81 06/14/2017 1202     Results for ANNALYSE, LANGLAIS (MRN [id number]) as of 07/05/2017 16:51  Ref. Range 12/31/2016 11:32  TSH Latest Ref Range: 0.450 - 4.500 uIU/mL  0.492  T4,Free(Direct) Latest Ref Range: 0.82 - 1.77 ng/dL 1.67      MRI of abdomen on May 04, 2017:  Normal right adrenal gland. Left adrenal nodule on the order of 1.5 cm is consistent with an adenoma.  Assessment & Plan:   1. Adenoma of left adrenal gland  -She has undergone 24-hour urine studies for catecholamines, metanephrines, cortisol, and aldosterone, which are all within normal limits. -This is consistent with nonfunctioning left adrenal adenoma of 1.5 cm. -She will not require any specific treatment at this time. -She is scheduled to have surveillance MRI of abdomen related to her history of pancreatic pseudocyst, hoping that it will include adrenal glands and would be a chance to see if the adrenal adenoma is changing. - I advised patient to maintain close follow up with Kathyrn Drown, MD for primary care needs.  - Time spent with the patient: 25 min, of which >50% was spent in reviewing her  current and  previous labs, previous treatments, and  developing a plan for long-term care.  Kristen Mack participated in the discussions, expressed understanding, and voiced agreement with the above plans.  All questions were answered to her satisfaction. she is encouraged to contact clinic should she have any questions or concerns prior to her return visit.   Follow up plan: Return in about 1 year (around 07/06/2018) for follow up with pre-visit labs.   Glade Lloyd, MD [facility] [place] [address] [place], [place] [address] Phone: [phone]  Fax: [phone]     07/05/2017, 4:47 PM  This note was partially dictated with voice recognition software. Similar sounding words can be transcribed inadequately or may not  be corrected upon review.

## 2017-07-07 ENCOUNTER — Ambulatory Visit: Payer: PPO | Admitting: "Endocrinology

## 2017-07-11 ENCOUNTER — Telehealth: Payer: Self-pay | Admitting: Family Medicine

## 2017-07-11 ENCOUNTER — Encounter: Payer: Self-pay | Admitting: Family Medicine

## 2017-07-11 DIAGNOSIS — G4733 Obstructive sleep apnea (adult) (pediatric): Secondary | ICD-10-CM | POA: Insufficient documentation

## 2017-07-11 DIAGNOSIS — G4730 Sleep apnea, unspecified: Secondary | ICD-10-CM

## 2017-07-11 HISTORY — DX: Sleep apnea, unspecified: G47.30

## 2017-07-11 HISTORY — DX: Obstructive sleep apnea (adult) (pediatric): G47.33

## 2017-07-11 NOTE — Telephone Encounter (Signed)
The patient did a home pulmonary function tests as well as a home sleep apnea test.  Sleep apnea test did show moderate sleep apnea.  CPAP machine was ordered for the patient.

## 2017-07-11 NOTE — Telephone Encounter (Signed)
The patient had MRI of the abdomen in February showed a pseudocyst on the pancreas.  Patient is aware.  Nurses-please put in the reminder file for August a repeat MRI of the pancreatic pseudocyst as well as MRI of the adrenal glands are adenoma-patient saw Dr. Dorris Fetch endocrinology who recommended a follow-up on adrenal gland-certainly if the patient needs a follow-up office visit at that time in order to get this approved so be it-main thing is to put in the reminder file in 6 months August 2019

## 2017-07-11 NOTE — Telephone Encounter (Signed)
Pt added to tickler file for august 2019

## 2017-07-18 DIAGNOSIS — G4733 Obstructive sleep apnea (adult) (pediatric): Secondary | ICD-10-CM | POA: Diagnosis not present

## 2017-07-22 ENCOUNTER — Encounter: Payer: Self-pay | Admitting: Family Medicine

## 2017-08-17 DIAGNOSIS — G4733 Obstructive sleep apnea (adult) (pediatric): Secondary | ICD-10-CM | POA: Diagnosis not present

## 2017-08-25 ENCOUNTER — Ambulatory Visit (INDEPENDENT_AMBULATORY_CARE_PROVIDER_SITE_OTHER): Payer: PPO | Admitting: Otolaryngology

## 2017-08-25 DIAGNOSIS — H6123 Impacted cerumen, bilateral: Secondary | ICD-10-CM | POA: Diagnosis not present

## 2017-08-25 DIAGNOSIS — H6983 Other specified disorders of Eustachian tube, bilateral: Secondary | ICD-10-CM | POA: Diagnosis not present

## 2017-08-25 DIAGNOSIS — H903 Sensorineural hearing loss, bilateral: Secondary | ICD-10-CM

## 2017-09-01 ENCOUNTER — Other Ambulatory Visit: Payer: Self-pay | Admitting: Family Medicine

## 2017-09-17 DIAGNOSIS — G4733 Obstructive sleep apnea (adult) (pediatric): Secondary | ICD-10-CM | POA: Diagnosis not present

## 2017-09-27 ENCOUNTER — Other Ambulatory Visit: Payer: Self-pay | Admitting: Family Medicine

## 2017-10-03 ENCOUNTER — Other Ambulatory Visit: Payer: Self-pay | Admitting: Family Medicine

## 2017-10-05 ENCOUNTER — Other Ambulatory Visit: Payer: Self-pay | Admitting: Family Medicine

## 2017-10-05 ENCOUNTER — Telehealth: Payer: Self-pay | Admitting: Family Medicine

## 2017-10-05 DIAGNOSIS — E785 Hyperlipidemia, unspecified: Secondary | ICD-10-CM

## 2017-10-05 DIAGNOSIS — R7303 Prediabetes: Secondary | ICD-10-CM

## 2017-10-05 DIAGNOSIS — I10 Essential (primary) hypertension: Secondary | ICD-10-CM

## 2017-10-05 DIAGNOSIS — Z79899 Other long term (current) drug therapy: Secondary | ICD-10-CM

## 2017-10-05 NOTE — Telephone Encounter (Signed)
appt Tuesday. Last bw ordered by dr Nicki Reaper was march 2019 lipid,liver, a1c, bmp,   MRI needs to be august 6th or later. Will ask pt a good day to go when I call her about the labs and schedule.

## 2017-10-05 NOTE — Telephone Encounter (Signed)
Repeat same b w since seeing dr Nicki Reaper next tue wait til then to sched mri since cant do for a while yet

## 2017-10-05 NOTE — Telephone Encounter (Signed)
Left message to return call; labs placed

## 2017-10-05 NOTE — Telephone Encounter (Signed)
Patient has an appointment on 10/11/17 with Dr. Nicki Reaper.  She is requesting orders for labs.  Also, she said that Dr. Nicki Reaper mentioned an MRI of her abdomen being ordered.

## 2017-10-06 ENCOUNTER — Other Ambulatory Visit: Payer: Self-pay | Admitting: *Deleted

## 2017-10-06 DIAGNOSIS — E785 Hyperlipidemia, unspecified: Secondary | ICD-10-CM | POA: Diagnosis not present

## 2017-10-06 DIAGNOSIS — R7303 Prediabetes: Secondary | ICD-10-CM | POA: Diagnosis not present

## 2017-10-06 DIAGNOSIS — K862 Cyst of pancreas: Secondary | ICD-10-CM

## 2017-10-06 DIAGNOSIS — Z79899 Other long term (current) drug therapy: Secondary | ICD-10-CM | POA: Diagnosis not present

## 2017-10-06 DIAGNOSIS — I10 Essential (primary) hypertension: Secondary | ICD-10-CM | POA: Diagnosis not present

## 2017-10-06 NOTE — Telephone Encounter (Signed)
Pt notified  bloodwork orders are ready and MRI put in to be precerted and scheduled afer august 6th.

## 2017-10-07 LAB — HEPATIC FUNCTION PANEL
ALBUMIN: 4 g/dL (ref 3.5–4.8)
ALK PHOS: 89 IU/L (ref 39–117)
ALT: 50 IU/L — ABNORMAL HIGH (ref 0–32)
AST: 51 IU/L — AB (ref 0–40)
BILIRUBIN TOTAL: 0.4 mg/dL (ref 0.0–1.2)
BILIRUBIN, DIRECT: 0.13 mg/dL (ref 0.00–0.40)
Total Protein: 7.3 g/dL (ref 6.0–8.5)

## 2017-10-07 LAB — HEMOGLOBIN A1C
Est. average glucose Bld gHb Est-mCnc: 128 mg/dL
Hgb A1c MFr Bld: 6.1 % — ABNORMAL HIGH (ref 4.8–5.6)

## 2017-10-07 LAB — LIPID PANEL
CHOL/HDL RATIO: 3.1 ratio (ref 0.0–4.4)
Cholesterol, Total: 144 mg/dL (ref 100–199)
HDL: 46 mg/dL (ref >39)
LDL Calculated: 70 mg/dL (ref 0–99)
Triglycerides: 141 mg/dL (ref 0–149)
VLDL Cholesterol Cal: 28 mg/dL (ref 5–40)

## 2017-10-07 LAB — BASIC METABOLIC PANEL
BUN/Creatinine Ratio: 25 (ref 12–28)
BUN: 17 mg/dL (ref 8–27)
CO2: 20 mmol/L (ref 20–29)
CREATININE: 0.69 mg/dL (ref 0.57–1.00)
Calcium: 9.8 mg/dL (ref 8.7–10.3)
Chloride: 105 mmol/L (ref 96–106)
GFR, EST AFRICAN AMERICAN: 100 mL/min/{1.73_m2} (ref >59)
GFR, EST NON AFRICAN AMERICAN: 87 mL/min/{1.73_m2} (ref >59)
GLUCOSE: 130 mg/dL — AB (ref 65–99)
Potassium: 4.6 mmol/L (ref 3.5–5.2)
Sodium: 142 mmol/L (ref 134–144)

## 2017-10-11 ENCOUNTER — Encounter: Payer: Self-pay | Admitting: Family Medicine

## 2017-10-11 ENCOUNTER — Ambulatory Visit (INDEPENDENT_AMBULATORY_CARE_PROVIDER_SITE_OTHER): Payer: PPO | Admitting: Family Medicine

## 2017-10-11 VITALS — BP 142/96 | Ht 61.0 in | Wt 206.0 lb

## 2017-10-11 DIAGNOSIS — R7303 Prediabetes: Secondary | ICD-10-CM

## 2017-10-11 DIAGNOSIS — K760 Fatty (change of) liver, not elsewhere classified: Secondary | ICD-10-CM | POA: Diagnosis not present

## 2017-10-11 DIAGNOSIS — K863 Pseudocyst of pancreas: Secondary | ICD-10-CM | POA: Diagnosis not present

## 2017-10-11 DIAGNOSIS — I10 Essential (primary) hypertension: Secondary | ICD-10-CM | POA: Diagnosis not present

## 2017-10-11 DIAGNOSIS — G4733 Obstructive sleep apnea (adult) (pediatric): Secondary | ICD-10-CM

## 2017-10-11 NOTE — Progress Notes (Signed)
Subjective:    Patient ID: Kristen Mack, female    DOB: [birth year], 72 y.o.   MRN: [id number]  Hypertension  This is a chronic problem. Pertinent negatives include no chest pain or shortness of breath.  She thinks her blood pressure medicine regular basis because of her fibromyalgia she does not tolerate blood pressure checks well she recently bought a wrist blood pressure cuff to use and states her readings have been moderately elevated  She denies any chest tightness pressure pain shortness of breath or headaches with all of this  Her recent lab work overall look good in regards to the A1c Would like to discuss sleep study results.  Detailed discussion about his CPAP testing and with her risk of sudden death as well as stroke and how she is using her CPAP machine and seems to help she uses it greater than 80% of time more than 4 hours every single night it is followed by [employer] things were answered in detail  Would like to discuss the blood work was discussed in detail including elevated liver enzymes she does have fatty liver she has had ultrasounds in the past and a recent MRI which did show fatty liver best of our knowledge she does not have cirrhosis currently she denies any bleeding problems or swelling in the legs    Prediabetes. Had A1C on bloodwork.  And the rest of her lab work also looked at these were all discussed in detail  She also has a history of a pancreatic pseudocyst which was discussed in detail she needs a follow-up MRI per recommendation of radiology to be done this August   Review of Systems  Constitutional: Negative for activity change, appetite change and fatigue.  HENT: Negative for congestion and rhinorrhea.   Respiratory: Negative for cough and shortness of breath.   Cardiovascular: Negative for chest pain and leg swelling.  Gastrointestinal: Negative for abdominal pain and constipation.  Endocrine: Negative for polydipsia and  polyphagia.  Musculoskeletal: Negative for arthralgias and back pain.  Skin: Negative for color change.  Neurological: Negative for weakness.  Psychiatric/Behavioral: Negative for confusion.       Objective:   Physical Exam  Constitutional: She appears well-nourished. No distress.  Cardiovascular: Normal rate, regular rhythm and normal heart sounds.  No murmur heard. Pulmonary/Chest: Effort normal and breath sounds normal. No respiratory distress.  Musculoskeletal: She exhibits no edema.  Lymphadenopathy:    She has no cervical adenopathy.  Neurological: She is alert. She exhibits normal muscle tone.  Psychiatric: Her behavior is normal.  Vitals reviewed.  Care was taken to check her blood pressure using her forearm and a good reading was obtained when she was sitting and see       Assessment & Plan:  Blood pressure-Long discussion held regarding this importance of taking medicine under good control currently watch diet try to lose weight keep salt down  Sleep apnea uses it majority of the time it does help her we reviewed over her sleep study  Fatty liver liver enzymes continue to be slightly elevated watch diet closely patient is interested in the last her graft the we will look into see if this is covered by Medicare  Pancreatic pseudocyst needs follow-up MRI this will be done somewhere in August await the results of this may need referral to specialist depending on what is shown  Prediabetes under good control watch diet keep healthy and good control  25 minutes was spent with the patient.  This statement verifies that 25 minutes was indeed spent with the patient.  More than 50% of this visit-total duration of the visit-was spent in counseling and coordination of care. The issues that the patient came in for today as reflected in the diagnosis (s) please refer to documentation for further details.

## 2017-10-17 DIAGNOSIS — G4733 Obstructive sleep apnea (adult) (pediatric): Secondary | ICD-10-CM | POA: Diagnosis not present

## 2017-10-18 DIAGNOSIS — G4733 Obstructive sleep apnea (adult) (pediatric): Secondary | ICD-10-CM | POA: Diagnosis not present

## 2017-10-27 ENCOUNTER — Telehealth: Payer: Self-pay | Admitting: Family Medicine

## 2017-10-27 NOTE — Telephone Encounter (Signed)
Left message to return call 

## 2017-10-27 NOTE — Telephone Encounter (Signed)
Pt contacted office concerned about her BP. She states upon waking up her BP was 173/104, she rested for about 5 minutes and sitting her BP was 155/91; and standing her BP was 123/91. Patient has no vision changes, no headache. Pt states her BP has been "goofy" all week. She is taking her medication like she is supposed to. Pt advised to go to ER for evaluation. Pt verbalized understanding.

## 2017-10-27 NOTE — Telephone Encounter (Signed)
Pt returned call and is not experiencing chest discomfort or head pain; pt is getting an appt for tomorrow

## 2017-10-27 NOTE — Telephone Encounter (Signed)
Thank you for talking with patient If she is not experiencing any head pain or chest discomfort if she would like to be seen tomorrow for her blood pressure that would be reasonable giving her numbers

## 2017-10-28 ENCOUNTER — Encounter: Payer: Self-pay | Admitting: Family Medicine

## 2017-10-28 ENCOUNTER — Ambulatory Visit (INDEPENDENT_AMBULATORY_CARE_PROVIDER_SITE_OTHER): Payer: PPO | Admitting: Family Medicine

## 2017-10-28 VITALS — BP 138/90 | Ht 61.0 in | Wt 209.4 lb

## 2017-10-28 DIAGNOSIS — I10 Essential (primary) hypertension: Secondary | ICD-10-CM | POA: Diagnosis not present

## 2017-10-28 MED ORDER — HYDROCHLOROTHIAZIDE 12.5 MG PO CAPS: ORAL_CAPSULE | ORAL | 5 refills | Status: DC

## 2017-10-28 NOTE — Progress Notes (Signed)
   Subjective:    Patient ID: Kristen Mack, female    DOB: [birth year], 72 y.o.   MRN: [id number]  HPI  Patient arrives with concerns of blood pressure reading high with ome wrist cuff. Multiple episodes of elevated blood pressure in past couple weeks been working at taking medication watching diet minimizing salt very concerned about this denies any chest tightness pressure pain unilateral numbness weakness blurry vision 15 minutes was spent with patient today discussing healthcare issues which they came.  More than 50% of this visit-total duration of visit-was spent in counseling and coordination of care.  Please see diagnosis regarding the focus of this coordination and care Has history of hypertension takes medication this is a chronic issue worse currently Review of Systems  Constitutional: Negative for activity change, fatigue and fever.  HENT: Negative for congestion and rhinorrhea.   Respiratory: Negative for cough, chest tightness and shortness of breath.   Cardiovascular: Negative for chest pain and leg swelling.  Gastrointestinal: Negative for abdominal pain and nausea.  Skin: Negative for color change.  Neurological: Negative for dizziness and headaches.  Psychiatric/Behavioral: Negative for agitation and behavioral problems.       Objective:   Physical Exam  Constitutional: She appears well-nourished. No distress.  HENT:  Head: Normocephalic and atraumatic.  Eyes: Right eye exhibits no discharge. Left eye exhibits no discharge.  Neck: No tracheal deviation present.  Cardiovascular: Normal rate, regular rhythm and normal heart sounds.  No murmur heard. Pulmonary/Chest: Effort normal and breath sounds normal. No respiratory distress.  Musculoskeletal: She exhibits no edema.  Lymphadenopathy:    She has no cervical adenopathy.  Neurological: She is alert. Coordination normal.  Skin: Skin is warm and dry.  Psychiatric: She has a normal mood and affect. Her behavior  is normal.  Vitals reviewed.   Blood pressure was checked with her wrist cuff and with wall cuff on her wrist best reading 140/90      Assessment & Plan:  Mild hypertension continue current medications add HCTZ 12.5 mg 1 every morning recommend metabolic 7 in 5 to 6 weeks send [place] some blood pressure readings within 2 to 3 weeks follow-up office visit within 6 weeks

## 2017-11-08 ENCOUNTER — Other Ambulatory Visit: Payer: Self-pay | Admitting: Family Medicine

## 2017-11-08 ENCOUNTER — Ambulatory Visit (HOSPITAL_COMMUNITY)
Admission: RE | Admit: 2017-11-08 | Discharge: 2017-11-08 | Disposition: A | Discharge status: Home / Self Care | Payer: PPO | Source: Ambulatory Visit | Attending: Family Medicine | Admitting: Family Medicine

## 2017-11-08 DIAGNOSIS — K449 Diaphragmatic hernia without obstruction or gangrene: Secondary | ICD-10-CM | POA: Diagnosis not present

## 2017-11-08 DIAGNOSIS — K7689 Other specified diseases of liver: Secondary | ICD-10-CM | POA: Diagnosis not present

## 2017-11-08 DIAGNOSIS — M4186 Other forms of scoliosis, lumbar region: Secondary | ICD-10-CM | POA: Diagnosis not present

## 2017-11-08 DIAGNOSIS — K862 Cyst of pancreas: Secondary | ICD-10-CM | POA: Diagnosis not present

## 2017-11-08 MED ORDER — GADOBENATE DIMEGLUMINE 529 MG/ML IV SOLN
20.0000 mL | Freq: Once | INTRAVENOUS | Status: AC | PRN
  Administered 2017-11-08: 20 mL via INTRAVENOUS

## 2017-11-17 DIAGNOSIS — G4733 Obstructive sleep apnea (adult) (pediatric): Secondary | ICD-10-CM | POA: Diagnosis not present

## 2017-12-09 DIAGNOSIS — I10 Essential (primary) hypertension: Secondary | ICD-10-CM | POA: Diagnosis not present

## 2017-12-10 LAB — BASIC METABOLIC PANEL
BUN/Creatinine Ratio: 22 (ref 12–28)
BUN: 15 mg/dL (ref 8–27)
CALCIUM: 9.7 mg/dL (ref 8.7–10.3)
CO2: 18 mmol/L — AB (ref 20–29)
CREATININE: 0.67 mg/dL (ref 0.57–1.00)
Chloride: 105 mmol/L (ref 96–106)
GFR, EST AFRICAN AMERICAN: 101 mL/min/{1.73_m2} (ref >59)
GFR, EST NON AFRICAN AMERICAN: 87 mL/min/{1.73_m2} (ref >59)
Glucose: 147 mg/dL — ABNORMAL HIGH (ref 65–99)
POTASSIUM: 4.5 mmol/L (ref 3.5–5.2)
Sodium: 141 mmol/L (ref 134–144)

## 2017-12-13 ENCOUNTER — Encounter: Payer: Self-pay | Admitting: Family Medicine

## 2017-12-13 ENCOUNTER — Ambulatory Visit (INDEPENDENT_AMBULATORY_CARE_PROVIDER_SITE_OTHER): Payer: PPO | Admitting: Family Medicine

## 2017-12-13 VITALS — BP 134/72 | Ht 61.0 in | Wt 212.0 lb

## 2017-12-13 DIAGNOSIS — I10 Essential (primary) hypertension: Secondary | ICD-10-CM | POA: Diagnosis not present

## 2017-12-13 NOTE — Progress Notes (Signed)
   Subjective:    Patient ID: Kristen Mack, female    DOB: 1945/11/10, 72 y.o.   MRN: [id number]  HPI  Patient is here today to follow up on chronic health issues. She states she eats healthy, and does not exercise. She sees an eye Dr.  Patient states she is trying to do good job watching her diet She is taking her medication HCTZ is causing her to go the bathroom a lot but she feels it is helping her blood pressure She denies any type of chest pressure tightness or shortness of breath Denies any severe headaches  Review of Systems  Constitutional: Negative for activity change, fatigue and fever.  HENT: Negative for congestion and rhinorrhea.   Respiratory: Negative for cough, chest tightness and shortness of breath.   Cardiovascular: Negative for chest pain and leg swelling.  Gastrointestinal: Negative for abdominal pain and nausea.  Skin: Negative for color change.  Neurological: Negative for dizziness and headaches.  Psychiatric/Behavioral: Negative for agitation and behavioral problems.       Objective:   Physical Exam  Constitutional: She appears well-nourished. No distress.  HENT:  Head: Normocephalic and atraumatic.  Eyes: Right eye exhibits no discharge. Left eye exhibits no discharge.  Neck: No tracheal deviation present.  Cardiovascular: Normal rate, regular rhythm and normal heart sounds.  No murmur heard. Pulmonary/Chest: Effort normal and breath sounds normal. No respiratory distress.  Musculoskeletal: She exhibits no edema.  Lymphadenopathy:    She has no cervical adenopathy.  Neurological: She is alert. Coordination normal.  Skin: Skin is warm and dry.  Psychiatric: She has a normal mood and affect. Her behavior is normal.  Vitals reviewed.         Assessment & Plan:  Overall blood pressure very good I checked her blood pressure sitting in the chair also checked laying sitting standing overall very good control continue current measures watch  diet closely stay physically active.  Follow-up again in several months time follow-up sooner if problems.  No need to doing blood work currently

## 2017-12-18 DIAGNOSIS — G4733 Obstructive sleep apnea (adult) (pediatric): Secondary | ICD-10-CM | POA: Diagnosis not present

## 2017-12-23 DIAGNOSIS — G4733 Obstructive sleep apnea (adult) (pediatric): Secondary | ICD-10-CM | POA: Diagnosis not present

## 2018-01-11 ENCOUNTER — Other Ambulatory Visit: Payer: Self-pay | Admitting: Family Medicine

## 2018-01-17 DIAGNOSIS — G4733 Obstructive sleep apnea (adult) (pediatric): Secondary | ICD-10-CM | POA: Diagnosis not present

## 2018-01-19 ENCOUNTER — Other Ambulatory Visit: Payer: Self-pay | Admitting: Family Medicine

## 2018-01-25 DIAGNOSIS — G4733 Obstructive sleep apnea (adult) (pediatric): Secondary | ICD-10-CM | POA: Diagnosis not present

## 2018-02-05 ENCOUNTER — Other Ambulatory Visit: Payer: Self-pay | Admitting: Family Medicine

## 2018-02-07 ENCOUNTER — Encounter: Payer: Self-pay | Admitting: Family Medicine

## 2018-02-07 DIAGNOSIS — I10 Essential (primary) hypertension: Secondary | ICD-10-CM

## 2018-02-07 NOTE — Telephone Encounter (Signed)
Patient is diabetic not on insulin 1 testing per day may have prescription with refills 90-day with 3 refills  Also may have order for metabolic 7

## 2018-02-08 MED ORDER — GLUCOSE BLOOD VI STRP: ORAL_STRIP | 1 refills | Status: DC

## 2018-02-08 NOTE — Addendum Note (Signed)
Addended by: Dairl Ponder on: 02/08/2018 10:51 AM   Modules accepted: Orders

## 2018-02-13 ENCOUNTER — Telehealth: Payer: Self-pay | Admitting: Family Medicine

## 2018-02-13 NOTE — Telephone Encounter (Signed)
Please sign & date renewal order for Reclast so that I may send back  In red folder in basket on wall

## 2018-02-13 NOTE — Telephone Encounter (Signed)
This was completed thank you

## 2018-02-15 ENCOUNTER — Encounter: Payer: Self-pay | Admitting: Family Medicine

## 2018-02-15 NOTE — Telephone Encounter (Signed)
Sent signed order for Reclast  Sent to be scanned & filed

## 2018-02-16 ENCOUNTER — Other Ambulatory Visit: Payer: Self-pay | Admitting: Family Medicine

## 2018-02-16 DIAGNOSIS — R7303 Prediabetes: Secondary | ICD-10-CM

## 2018-02-16 DIAGNOSIS — E785 Hyperlipidemia, unspecified: Secondary | ICD-10-CM

## 2018-02-16 DIAGNOSIS — R5383 Other fatigue: Secondary | ICD-10-CM

## 2018-02-16 DIAGNOSIS — E038 Other specified hypothyroidism: Secondary | ICD-10-CM

## 2018-02-16 NOTE — Telephone Encounter (Signed)
Contacted daughter and informed her that we are going to order labs for patient. Pt daughter verbalized understanding. Pt daughter states she will get patient to contact office to change January appt to December.

## 2018-02-16 NOTE — Telephone Encounter (Signed)
Patient having significant fatigue tiredness with activity  I recommend that she go ahead with her lab work including V1O, metabolic 7, lipid, liver, CBC, TSH  Diagnosis hypothyroidism, diabetes, hyperlipidemia, fatigue  She has a follow-up appointment in January if they would like to move it into December they should speak with the front

## 2018-02-17 ENCOUNTER — Telehealth: Payer: Self-pay | Admitting: Family Medicine

## 2018-02-17 ENCOUNTER — Encounter: Payer: Self-pay | Admitting: Family Medicine

## 2018-02-17 ENCOUNTER — Other Ambulatory Visit: Payer: Self-pay | Admitting: Family Medicine

## 2018-02-17 ENCOUNTER — Other Ambulatory Visit: Payer: Self-pay

## 2018-02-17 DIAGNOSIS — G4733 Obstructive sleep apnea (adult) (pediatric): Secondary | ICD-10-CM | POA: Diagnosis not present

## 2018-02-17 DIAGNOSIS — Z1231 Encounter for screening mammogram for malignant neoplasm of breast: Secondary | ICD-10-CM

## 2018-02-17 MED ORDER — IPRATROPIUM BROMIDE 0.02 % IN SOLN: RESPIRATORY_TRACT | 2 refills | Status: DC

## 2018-02-17 NOTE — Telephone Encounter (Signed)
Refills sent in and patient is aware.

## 2018-02-17 NOTE — Telephone Encounter (Signed)
Pt just went to use her nebulizer and realized the medication has expired and so has the script to get a refill. She is hoping this can be called in this afternoon for her to [facility], [id number]

## 2018-02-17 NOTE — Telephone Encounter (Signed)
Please advise. Thank you

## 2018-02-17 NOTE — Telephone Encounter (Signed)
Pt called back and rescheduled Jan. appt to Dec 9th with Dr. Nicki Reaper.

## 2018-02-17 NOTE — Telephone Encounter (Signed)
Ok three ref

## 2018-02-18 ENCOUNTER — Other Ambulatory Visit: Payer: Self-pay | Admitting: Family Medicine

## 2018-02-18 DIAGNOSIS — R7303 Prediabetes: Secondary | ICD-10-CM | POA: Diagnosis not present

## 2018-02-18 DIAGNOSIS — R5383 Other fatigue: Secondary | ICD-10-CM | POA: Diagnosis not present

## 2018-02-18 DIAGNOSIS — E785 Hyperlipidemia, unspecified: Secondary | ICD-10-CM | POA: Diagnosis not present

## 2018-02-18 DIAGNOSIS — E038 Other specified hypothyroidism: Secondary | ICD-10-CM | POA: Diagnosis not present

## 2018-02-18 MED ORDER — IPRATROPIUM BROMIDE 0.02 % IN SOLN: RESPIRATORY_TRACT | 6 refills | Status: DC

## 2018-02-19 LAB — LIPID PANEL
Chol/HDL Ratio: 3.9 ratio (ref 0.0–4.4)
Cholesterol, Total: 166 mg/dL (ref 100–199)
HDL: 43 mg/dL (ref >39)
LDL Calculated: 95 mg/dL (ref 0–99)
Triglycerides: 142 mg/dL (ref 0–149)
VLDL CHOLESTEROL CAL: 28 mg/dL (ref 5–40)

## 2018-02-19 LAB — CBC WITH DIFFERENTIAL/PLATELET
BASOS: 2 %
Basophils Absolute: 0.1 10*3/uL (ref 0.0–0.2)
EOS (ABSOLUTE): 0.5 10*3/uL — AB (ref 0.0–0.4)
Eos: 9 %
Hematocrit: 40 % (ref 34.0–46.6)
Hemoglobin: 13.4 g/dL (ref 11.1–15.9)
Immature Grans (Abs): 0 10*3/uL (ref 0.0–0.1)
Immature Granulocytes: 0 %
LYMPHS: 24 %
Lymphocytes Absolute: 1.3 10*3/uL (ref 0.7–3.1)
MCH: 28.4 pg (ref 26.6–33.0)
MCHC: 33.5 g/dL (ref 31.5–35.7)
MCV: 85 fL (ref 79–97)
MONOS ABS: 0.4 10*3/uL (ref 0.1–0.9)
Monocytes: 8 %
NEUTROS ABS: 3.1 10*3/uL (ref 1.4–7.0)
NEUTROS PCT: 57 %
PLATELETS: 396 10*3/uL (ref 150–450)
RBC: 4.72 x10E6/uL (ref 3.77–5.28)
RDW: 13.6 % (ref 12.3–15.4)
WBC: 5.4 10*3/uL (ref 3.4–10.8)

## 2018-02-19 LAB — BASIC METABOLIC PANEL
BUN/Creatinine Ratio: 18 (ref 12–28)
BUN: 12 mg/dL (ref 8–27)
CO2: 21 mmol/L (ref 20–29)
Calcium: 9.3 mg/dL (ref 8.7–10.3)
Chloride: 103 mmol/L (ref 96–106)
Creatinine, Ser: 0.67 mg/dL (ref 0.57–1.00)
GFR calc Af Amer: 101 mL/min/{1.73_m2} (ref >59)
GFR calc non Af Amer: 87 mL/min/{1.73_m2} (ref >59)
Glucose: 151 mg/dL — ABNORMAL HIGH (ref 65–99)
Potassium: 4.2 mmol/L (ref 3.5–5.2)
SODIUM: 143 mmol/L (ref 134–144)

## 2018-02-19 LAB — HEPATIC FUNCTION PANEL
ALT: 69 IU/L — AB (ref 0–32)
AST: 69 IU/L — ABNORMAL HIGH (ref 0–40)
Albumin: 4.4 g/dL (ref 3.5–4.8)
Alkaline Phosphatase: 98 IU/L (ref 39–117)
BILIRUBIN, DIRECT: 0.11 mg/dL (ref 0.00–0.40)
Bilirubin Total: 0.5 mg/dL (ref 0.0–1.2)
Total Protein: 7.2 g/dL (ref 6.0–8.5)

## 2018-02-19 LAB — HEMOGLOBIN A1C
ESTIMATED AVERAGE GLUCOSE: 140 mg/dL
Hgb A1c MFr Bld: 6.5 % — ABNORMAL HIGH (ref 4.8–5.6)

## 2018-02-19 LAB — TSH: TSH: 1.39 u[IU]/mL (ref 0.450–4.500)

## 2018-02-19 NOTE — Telephone Encounter (Signed)
That sounds fine

## 2018-02-20 ENCOUNTER — Ambulatory Visit (HOSPITAL_COMMUNITY)
Admission: RE | Admit: 2018-02-20 | Discharge: 2018-02-20 | Disposition: A | Discharge status: Home / Self Care | Payer: PPO | Source: Ambulatory Visit | Attending: Family Medicine | Admitting: Family Medicine

## 2018-02-20 ENCOUNTER — Encounter (HOSPITAL_COMMUNITY): Payer: Self-pay

## 2018-02-20 DIAGNOSIS — Z1231 Encounter for screening mammogram for malignant neoplasm of breast: Secondary | ICD-10-CM | POA: Diagnosis not present

## 2018-02-24 ENCOUNTER — Other Ambulatory Visit: Payer: Self-pay | Admitting: Family Medicine

## 2018-03-02 ENCOUNTER — Ambulatory Visit (INDEPENDENT_AMBULATORY_CARE_PROVIDER_SITE_OTHER): Payer: PPO | Admitting: Family Medicine

## 2018-03-02 ENCOUNTER — Encounter: Payer: Self-pay | Admitting: Family Medicine

## 2018-03-02 VITALS — BP 134/86 | Temp 99.5°F | Ht 61.0 in | Wt 214.0 lb

## 2018-03-02 DIAGNOSIS — J019 Acute sinusitis, unspecified: Secondary | ICD-10-CM

## 2018-03-02 DIAGNOSIS — B9689 Other specified bacterial agents as the cause of diseases classified elsewhere: Secondary | ICD-10-CM

## 2018-03-02 MED ORDER — DOXYCYCLINE HYCLATE 100 MG PO CAPS: 100.0000 mg | ORAL_CAPSULE | Freq: Two times a day (BID) | ORAL | 0 refills | Status: DC

## 2018-03-02 NOTE — Progress Notes (Signed)
   Subjective:    Patient ID: Kristen Mack, female    DOB: 1945-06-03, 72 y.o.   MRN: [id number]  Sinusitis  This is a new problem. Episode onset: 5 days. Associated symptoms include congestion, coughing, headaches and a sore throat. Pertinent negatives include no ear pain or shortness of breath. (Wheezing) Treatments tried: lysine, vit c, tussin, zyrtec, neb treatments,  atrovent.  Denies any chest congestion but having a little bit of occasional wheeze    Review of Systems  Constitutional: Negative for activity change and fever.  HENT: Positive for congestion, rhinorrhea and sore throat. Negative for ear pain.   Eyes: Negative for discharge.  Respiratory: Positive for cough. Negative for shortness of breath and wheezing.   Cardiovascular: Negative for chest pain.  Neurological: Positive for headaches.       Objective:   Physical Exam  Constitutional: She appears well-developed.  HENT:  Head: Normocephalic.  Nose: Nose normal.  Mouth/Throat: Oropharynx is clear and moist. No oropharyngeal exudate.  Neck: Neck supple.  Cardiovascular: Normal rate and normal heart sounds.  No murmur heard. Pulmonary/Chest: Effort normal and breath sounds normal. She has no wheezes.  Lymphadenopathy:    She has no cervical adenopathy.  Skin: Skin is warm and dry.  Nursing note and vitals reviewed.         Assessment & Plan:  Acute rhinosinusitis Antibiotic prescribed Warning signs discussed Follow-up if progressive troubles or worse

## 2018-03-06 ENCOUNTER — Encounter: Payer: Self-pay | Admitting: Family Medicine

## 2018-03-06 ENCOUNTER — Ambulatory Visit (INDEPENDENT_AMBULATORY_CARE_PROVIDER_SITE_OTHER): Payer: PPO | Admitting: Family Medicine

## 2018-03-06 VITALS — BP 110/72 | HR 73 | Temp 99.0°F | Ht 61.0 in | Wt 215.0 lb

## 2018-03-06 DIAGNOSIS — R7303 Prediabetes: Secondary | ICD-10-CM | POA: Diagnosis not present

## 2018-03-06 DIAGNOSIS — E785 Hyperlipidemia, unspecified: Secondary | ICD-10-CM

## 2018-03-06 DIAGNOSIS — R5383 Other fatigue: Secondary | ICD-10-CM | POA: Diagnosis not present

## 2018-03-06 DIAGNOSIS — M545 Low back pain, unspecified: Secondary | ICD-10-CM

## 2018-03-06 DIAGNOSIS — R740 Nonspecific elevation of levels of transaminase and lactic acid dehydrogenase [LDH]: Secondary | ICD-10-CM

## 2018-03-06 DIAGNOSIS — K760 Fatty (change of) liver, not elsewhere classified: Secondary | ICD-10-CM | POA: Diagnosis not present

## 2018-03-06 DIAGNOSIS — R7401 Elevation of levels of liver transaminase levels: Secondary | ICD-10-CM

## 2018-03-06 DIAGNOSIS — I10 Essential (primary) hypertension: Secondary | ICD-10-CM

## 2018-03-06 NOTE — Progress Notes (Signed)
Subjective:    Patient ID: Kristen Mack, female    DOB: 1945-07-07, 72 y.o.   MRN: [id number]  HPI Patient is here today to follow up on her chronic illnesses. She has a history of Htn. She is taking Amlodipine 5 mg once per day,HCTZ 12.5 one po Q am,Losartan 100 mg once per day.She says she eats healthy per pt,but daughter disagrees. She does get some exercise.She has a history of depression and is supposed to be taking Wellbutrin 150 mg one Bid,but reports she is only taking one per day. She has a cold also and was seen last week and placed on Doxycycline,was doing better,but yesterday started sneezing,runny nose,and coughing.  Recent upper respiratory illness now starting to get somewhat better  Chronic fatigue depression stable patient is a CPAP machine she does take her Wellbutrin on a regular basis  Prediabetes she does not do a good job watching starches in her diet typically eats out very often gets into much carbohydrates  Blood pressure takes her medicine on a regular basis tries to avoid excessive salt  Fatty liver been going on for years has element of [place] liver enzymes recently a slight elevation compared to where it was  The patient's BMI is calculated.  The patient does have obesity.  The patient does try to some degree staying active and watching diet.  It is in the vital signs and acknowledged.  It is above the recommended BMI for the patient's height and weight.  The patient has been counseled regarding healthy diet, restricted portions, avoiding excessive carbohydrates/sugary foods, and increase physical activity as health permits.  It is in the patient's best interest to lower the risk of secondary illness including heart disease strokes and cancer by losing weight.  The patient acknowledges this information.  Patient mentions a sharp pain in her lower back on the left side on palpation there is little bit of muscle tenderness this pain comes and goes more likely  musculoskeletal Review of Systems  Constitutional: Negative for activity change, appetite change and fatigue.  HENT: Negative for congestion and rhinorrhea.   Respiratory: Negative for cough and shortness of breath.   Cardiovascular: Negative for chest pain and leg swelling.  Gastrointestinal: Negative for abdominal pain and diarrhea.  Endocrine: Negative for polydipsia and polyphagia.  Skin: Negative for color change.  Neurological: Negative for dizziness and weakness.  Psychiatric/Behavioral: Negative for behavioral problems and confusion.       Objective:   Physical Exam  Constitutional: She appears well-nourished. No distress.  HENT:  Head: Normocephalic and atraumatic.  Eyes: Right eye exhibits no discharge. Left eye exhibits no discharge.  Neck: No tracheal deviation present.  Cardiovascular: Normal rate, regular rhythm and normal heart sounds.  No murmur heard. Pulmonary/Chest: Effort normal and breath sounds normal. No respiratory distress.  Musculoskeletal: She exhibits no edema.  Lymphadenopathy:    She has no cervical adenopathy.  Neurological: She is alert. Coordination normal.  Skin: Skin is warm and dry.  Psychiatric: She has a normal mood and affect. Her behavior is normal.  Vitals reviewed.   Back pain see above      Assessment & Plan:  Lab work was reviewed with the patient in detail Fatigue ongoing Probably partly related to age deconditioning multiple chronic health issues as well as sleep apnea does use CPAP machine on a regular basis  Prediabetes A1c is gone up very important to minimize starches in diet healthier diet more activity repeat A1c in approximately 3  to 4 months if not improved start additional medicines  Hyperlipidemia watch diet closely exercise  HTN good control continue current medications watch diet closely  Fatty liver liver enzymes up very important to be healthy diet regular physical activity repeat lab work again in 4 months if  it goes up dramatically get back in with gastroenterology  Morbid obesity very difficult for the patient to lose weight but is best she can she needs to exercise watch diet try to lose weight  Elevated transaminase levels related to the fatty liver  Lumbar pain and discomfort patient is open to going to physical therapy plus also x-rays of the lower back  25 minutes was spent with the patient.  This statement verifies that 25 minutes was indeed spent with the patient.  More than 50% of this visit-total duration of the visit-was spent in counseling and coordination of care. The issues that the patient came in for today as reflected in the diagnosis (s) please refer to documentation for further details.

## 2018-03-06 NOTE — Patient Instructions (Addendum)
Results for orders placed or performed in visit on 02/16/18  TSH  Result Value Ref Range   TSH 1.390 0.450 - 4.500 uIU/mL  CBC with Differential  Result Value Ref Range   WBC 5.4 3.4 - 10.8 x10E3/uL   RBC 4.72 3.77 - 5.28 x10E6/uL   Hemoglobin 13.4 11.1 - 15.9 g/dL   Hematocrit 40.0 34.0 - 46.6 %   MCV 85 79 - 97 fL   MCH 28.4 26.6 - 33.0 pg   MCHC 33.5 31.5 - 35.7 g/dL   RDW 13.6 12.3 - 15.4 %   Platelets 396 150 - 450 x10E3/uL   Neutrophils 57 Not Estab. %   Lymphs 24 Not Estab. %   Monocytes 8 Not Estab. %   Eos 9 Not Estab. %   Basos 2 Not Estab. %   Neutrophils Absolute 3.1 1.4 - 7.0 x10E3/uL   Lymphocytes Absolute 1.3 0.7 - 3.1 x10E3/uL   Monocytes Absolute 0.4 0.1 - 0.9 x10E3/uL   EOS (ABSOLUTE) 0.5 (H) 0.0 - 0.4 x10E3/uL   Basophils Absolute 0.1 0.0 - 0.2 x10E3/uL   Immature Granulocytes 0 Not Estab. %   Immature Grans (Abs) 0.0 0.0 - 0.1 x10E3/uL  Hepatic function panel  Result Value Ref Range   Total Protein 7.2 6.0 - 8.5 g/dL   Albumin 4.4 3.5 - 4.8 g/dL   Bilirubin Total 0.5 0.0 - 1.2 mg/dL   Bilirubin, Direct 0.11 0.00 - 0.40 mg/dL   Alkaline Phosphatase 98 39 - 117 IU/L   AST 69 (H) 0 - 40 IU/L   ALT 69 (H) 0 - 32 IU/L  Lipid Profile  Result Value Ref Range   Cholesterol, Total 166 100 - 199 mg/dL   Triglycerides 142 0 - 149 mg/dL   HDL 43 >39 mg/dL   VLDL Cholesterol Cal 28 5 - 40 mg/dL   LDL Calculated 95 0 - 99 mg/dL   Chol/HDL Ratio 3.9 0.0 - 4.4 ratio  Basic Metabolic Panel (BMET)  Result Value Ref Range   Glucose 151 (H) 65 - 99 mg/dL   BUN 12 8 - 27 mg/dL   Creatinine, Ser 0.67 0.57 - 1.00 mg/dL   GFR calc non Af Amer 87 >59 mL/min/1.73   GFR calc Af Amer 101 >59 mL/min/1.73   BUN/Creatinine Ratio 18 12 - 28   Sodium 143 134 - 144 mmol/L   Potassium 4.2 3.5 - 5.2 mmol/L   Chloride 103 96 - 106 mmol/L   CO2 21 20 - 29 mmol/L   Calcium 9.3 8.7 - 10.3 mg/dL  Hemoglobin A1c  Result Value Ref Range   Hgb A1c MFr Bld 6.5 (H) 4.8 - 5.6 %   Est. average glucose Bld gHb Est-mCnc 140 mg/dL   Shingrix and shingles prevention: know the facts!   Shingrix is a very effective vaccine to prevent shingles.   Shingles is a reactivation of chickenpox -more than 99% of Americans born before 1980 have had chickenpox even if they do not remember it. One in every 10 people who get shingles have severe long-lasting nerve pain as a result.   33 out of a 100 older adults will get shingles if they are unvaccinated.     This vaccine is very important for your health This vaccine is indicated for anyone 50 years or older. You can get this vaccine even if you have already had shingles because you can get the disease more than once in a lifetime.  Your risk for shingles and its complications  increases with age.  This vaccine has 2 doses.  The second dose would be 2 to 6 months after the first dose.  If you had Zostavax vaccine in the past you should still get Shingrix. ( Zostavax is only 70% effective and it loses significant strength over a few years .)  This vaccine is given through the pharmacy.  The cost of the vaccine is through your insurance. The pharmacy can inform you of the total costs.  Common side effects including soreness in the arm, some redness and swelling, also some feel fatigue muscle soreness headache low-grade fever.  Side effects typically go away within 2 to 3 days. Remember-the pain from shingles can last a lifetime but these side effects of the vaccine will only last a few days at most. It is very important to get both doses in order to protect yourself fully.   Please get this vaccine at your earliest convenience at your trusted pharmacy.

## 2018-03-08 ENCOUNTER — Ambulatory Visit (HOSPITAL_COMMUNITY)
Admission: RE | Admit: 2018-03-08 | Discharge: 2018-03-08 | Disposition: A | Discharge status: Home / Self Care | Payer: PPO | Source: Ambulatory Visit | Attending: Family Medicine | Admitting: Family Medicine

## 2018-03-08 DIAGNOSIS — M545 Low back pain: Secondary | ICD-10-CM | POA: Diagnosis not present

## 2018-03-13 ENCOUNTER — Other Ambulatory Visit (HOSPITAL_COMMUNITY): Payer: PPO

## 2018-03-13 ENCOUNTER — Encounter (HOSPITAL_COMMUNITY): Payer: PPO

## 2018-03-17 ENCOUNTER — Encounter (HOSPITAL_COMMUNITY)
Admission: RE | Admit: 2018-03-17 | Discharge: 2018-03-17 | Disposition: A | Discharge status: Home / Self Care | Payer: PPO | Source: Ambulatory Visit | Attending: Family Medicine | Admitting: Family Medicine

## 2018-03-17 ENCOUNTER — Inpatient Hospital Stay (HOSPITAL_COMMUNITY): Admission: RE | Admit: 2018-03-17 | Payer: PPO | Source: Ambulatory Visit

## 2018-03-17 DIAGNOSIS — M810 Age-related osteoporosis without current pathological fracture: Secondary | ICD-10-CM | POA: Diagnosis not present

## 2018-03-17 MED ORDER — ZOLEDRONIC ACID 5 MG/100ML IV SOLN
5.0000 mg | Freq: Once | INTRAVENOUS | Status: AC
  Administered 2018-03-17: 5 mg via INTRAVENOUS
  Filled 2018-03-17: qty 100

## 2018-03-17 MED ORDER — SODIUM CHLORIDE 0.9 % IV SOLN
INTRAVENOUS | Status: DC
  Administered 2018-03-17: 13:00:00 via INTRAVENOUS

## 2018-03-19 DIAGNOSIS — G4733 Obstructive sleep apnea (adult) (pediatric): Secondary | ICD-10-CM | POA: Diagnosis not present

## 2018-03-28 ENCOUNTER — Encounter (INDEPENDENT_AMBULATORY_CARE_PROVIDER_SITE_OTHER): Payer: Self-pay | Admitting: *Deleted

## 2018-03-31 DIAGNOSIS — G4733 Obstructive sleep apnea (adult) (pediatric): Secondary | ICD-10-CM | POA: Diagnosis not present

## 2018-04-04 ENCOUNTER — Ambulatory Visit (HOSPITAL_COMMUNITY): Payer: PPO

## 2018-04-04 ENCOUNTER — Telehealth (INDEPENDENT_AMBULATORY_CARE_PROVIDER_SITE_OTHER): Payer: Self-pay | Admitting: *Deleted

## 2018-04-04 NOTE — Telephone Encounter (Signed)
Patient was sent letter for 5 yr TCS (hx polyps, fam hx colon ca) she called and left message that Dr Laural Golden told her at her at her last TCS that she could wait 10 years and she wants to wait 10 years -- TCS for 03/2023 changed in recall

## 2018-04-11 ENCOUNTER — Ambulatory Visit (HOSPITAL_COMMUNITY): Payer: PPO

## 2018-04-11 ENCOUNTER — Encounter: Payer: Self-pay | Admitting: Family Medicine

## 2018-04-11 NOTE — Telephone Encounter (Signed)
I looked at my notes from the day of the procedure and also date when I called the patient with biopsy results. I do not remember recommending 10 years but if she wants to wait another 5 years I am okay. She only had 2 small adenomas on her last exam.  Her brother had rectal carcinoma at young age.

## 2018-04-12 ENCOUNTER — Ambulatory Visit (HOSPITAL_COMMUNITY): Payer: PPO | Attending: Family Medicine | Admitting: Physical Therapy

## 2018-04-12 ENCOUNTER — Other Ambulatory Visit: Payer: Self-pay

## 2018-04-12 ENCOUNTER — Encounter (HOSPITAL_COMMUNITY): Payer: Self-pay | Admitting: Physical Therapy

## 2018-04-12 DIAGNOSIS — M6281 Muscle weakness (generalized): Secondary | ICD-10-CM | POA: Diagnosis not present

## 2018-04-12 DIAGNOSIS — M545 Low back pain, unspecified: Secondary | ICD-10-CM

## 2018-04-12 DIAGNOSIS — G8929 Other chronic pain: Secondary | ICD-10-CM | POA: Diagnosis not present

## 2018-04-12 DIAGNOSIS — R29898 Other symptoms and signs involving the musculoskeletal system: Secondary | ICD-10-CM | POA: Insufficient documentation

## 2018-04-12 NOTE — Patient Instructions (Signed)
Low Back Stretch: One leg (Supine)    Lying on back (opposite knee may be bent up), bring one knee toward chest by pulling gently behind knee. Can use a towel.  Hold __30__ seconds. 3 repetitions. Repeat with other leg. 1-2 times/day  Copyright  VHI. All rights reserved.

## 2018-04-12 NOTE — Therapy (Signed)
[place], [place], [address] Phone: [phone]   Fax:  [phone]  Physical Therapy Evaluation  Patient Details  Name: Kristen Mack MRN: [id number] Date of Birth: 1945/12/27 Referring Provider (PT): Sallee Lange, MD   Encounter Date: 04/12/2018  PT End of Session - 04/12/18 1537    Visit Number  1    Number of Visits  13    Date for PT Re-Evaluation  05/24/18   05/03/18 mini-reassess   Authorization Type  [employer]    Authorization Time Period  04/12/18 - 05/24/18    Authorization - Visit Number  1    Authorization - Number of Visits  10    PT Start Time  5974    PT Stop Time  1517    PT Time Calculation (min)  46 min    Activity Tolerance  Patient tolerated treatment well    Behavior During Therapy  [facility] for tasks assessed/performed       Past Medical History:  Diagnosis Date  . Arthritis   . Asthma   . CFS (chronic fatigue syndrome)   . Cracked tooth    X2  RIGHT LOWER MOLAR AND LEFT UPPER MOLAR  . Fatty liver disease, nonalcoholic   . Fibromyalgia   . GERD (gastroesophageal reflux disease)   . History of adenomatous polyp of colon   . History of cardiac murmur    until age 22  . History of TIA (transient ischemic attack)    per CT scan   . Hypertension   . Hypothyroidism   . IBS (irritable bowel syndrome)   . Meniere disease   . OSA (obstructive sleep apnea)    CPAP intolerate -- per study 2010  moderate osa--  STATES HAS RAISED HOB  . Osteopenia   . PMB (postmenopausal bleeding)   . Prediabetes   . Sleep apnea 07/11/2017   Abnormal home sleep apnea test February 2019-CPAP ordered  . Tooth loose    LOWER FRONT    Past Surgical History:  Procedure Laterality Date  . [place]  . CHOLECYSTECTOMY  1995   and  REPAIR UMBILICAL HERNIA  . COLONOSCOPY N/A 04/19/2013   Procedure: COLONOSCOPY;  Surgeon: Rogene Houston, MD;  [facility];   Service: Endoscopy;  Laterality: N/A;    . ESOPHAGOGASTRODUODENOSCOPY  02-08-2003  . HYSTEROSCOPY W/D&C  11-14-2002   endometrial polypectomy  . HYSTEROSCOPY W/D&C N/A 04/05/2014   Procedure: DILATATION AND CURETTAGE /HYSTEROSCOPY, ENDOMETRIAL POLYPECTOMY;  Surgeon: Sanjuana Kava, MD;  Location: [place];  Service: Gynecology;  Laterality: N/A;  . LIVER BIOPSY  01-29-2004   benign    There were no vitals filed for this visit.   Subjective Assessment - 04/12/18 1455    Subjective  Patient reported that she has fibromyalgia. She stated that her lower back has been hurting her for 40 plus years since she was pregnant. She stated that her low back bothers everything that she does and that when she's standing or walking and she said she feels her back tighten up and can feel it in her hips sometimes. Patient reported that sitting down decreases the pain. Patient reported that the last DEXA scan indicated that she has a lot of degenerative changes in her back. 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 Patient denied any changes in her bowel and bladder function associated with her back pain. Patient denied any  tingling or numbness. Patient denied saddle paresthesia. Patient reported that her worst pain is usually an 8/10 when she is fixing dinner when she is moving her upper extremities along with standing. Patient reported that she has arthritis particularly in her shoulders, hips, elbows, and hands occasionally.     Pertinent History  HTN, history of TIAs    Limitations  Lifting;Standing;House hold activities;Walking    How long can you sit comfortably?  Not limited    How long can you stand comfortably?  10 minutes    How long can you walk comfortably?  15 minutes    Diagnostic tests  X-ray lumbar spine - no acute findings, DDD    Patient Stated Goals  Figuring out a way to cook supper without her back hurting    Currently in Pain?  No/denies   Reported some achiness in shoulders due  to fibromyalgia        [facility] PT Assessment - 04/12/18 0001      Assessment   Medical Diagnosis  Lumbar Pain    Referring Provider (PT)  Sallee Lange, MD    Onset Date/Surgical Date  --   Many years ago   Next MD Visit  --   Sometime in March   Prior Therapy  None      Precautions   Precautions  None      Restrictions   Weight Bearing Restrictions  No      Balance Screen   Has the patient fallen in the past 6 months  No    Has the patient had a decrease in activity level because of a fear of falling?   No    Is the patient reluctant to leave their home because of a fear of falling?   No      Home Social worker  Private residence    Living Arrangements  Spouse/significant other;Children    Type of [place] to enter    Entrance Stairs-Number of Steps  6    Entrance Stairs-Rails  Right   Going up   [place]  Two level;Able to live on main level with bedroom/bathroom      Prior Function   Level of Independence  Independent;Independent with household mobility without device    Vocation  Retired      Charity fundraiser Status  Within Functional Limits for tasks assessed      Observation/Other Assessments   Focus on Therapeutic Outcomes (FOTO)   60% limited      Sensation   Light Touch  Appears Intact      Posture/Postural Control   Posture/Postural Control  Postural limitations    Postural Limitations  Rounded Shoulders;Forward head      ROM / Strength   AROM / PROM / Strength  AROM;Strength      AROM   AROM Assessment Site  Lumbar    Lumbar Flexion  WNL    Lumbar Extension  75% limited; pinch felt    Lumbar - Right Side Bend  25% limited; painful near SI joint on the right    Lumbar - Left Side Bend  25% limited    Lumbar - Right Rotation  25% limted    Lumbar - Left Rotation  25% limited      Strength   Strength Assessment Site  Hip;Knee;Ankle    Right/Left Hip  Right;Left    Right Hip Flexion   4-/5  Right Hip Extension  3-/5    Right Hip ABduction  3+/5    Left Hip Flexion  3+/5    Left Hip Extension  3-/5    Left Hip ABduction  3+/5    Right/Left Knee  Right;Left    Right Knee Flexion  4+/5    Right Knee Extension  4/5    Left Knee Flexion  4+/5    Left Knee Extension  4/5    Right/Left Ankle  Right;Left    Right Ankle Dorsiflexion  4+/5    Left Ankle Dorsiflexion  4+/5      Flexibility   Soft Tissue Assessment /Muscle Length  yes    Hamstrings  WFL bilaterally    Piriformis  WFL bilaterally      Palpation   Spinal mobility  Unable to assess due to reported sensitivity    SI assessment   Postive on sacral thrust    Palpation comment  Noted increased sensitivity and trigger points in patient's bilateral periscapular muscles, noted increased muscular restrictions in bilateral gluteal muscles.       Special Tests    Special Tests  Sacrolliac Tests    Sacroiliac Tests   Sacral Thrust      Sacral thrust    Findings  Positive    Side  Right    Comments  --      Balance   Balance Assessed  Yes      Static Standing Balance   Static Standing - Balance Support  No upper extremity supported    Static Standing Balance -  Activities   Single Leg Stance - Right Leg;Single Leg Stance - Left Leg    Static Standing - Comment/# of Minutes  SLS Rt: 1 seconds; SLS Left 2 seconds                Objective measurements completed on examination: See above findings.              PT Education - 04/12/18 1548    Education Details  Educated patient on examination findings, plan of care, and HEP.     Person(s) Educated  Patient    Methods  Explanation;Handout    Comprehension  Verbalized understanding       PT Short Term Goals - 04/12/18 1539      PT SHORT TERM GOAL #1   Title  Patient will report understanding and regular compliance of HEP to improve patient's strength, mobility, and decrease patient's pain.     Time  3    Period  Weeks    Status  New     Target Date  05/03/18      PT SHORT TERM GOAL #2   Title  Patient will report that her back pain has not exceeded a maximum of 6/10 over a 1 week period indicating improved tolerance to daily activities.     Time  3    Period  Weeks    Status  New    Target Date  05/03/18      PT SHORT TERM GOAL #3   Title  Patient will demonstrate improvement of at least 1/2 MMT strength grade in all muscle groups tested as deficient in order to improve patient's standing mechanics and stability.     Time  3    Period  Weeks    Status  New    Target Date  05/03/18        PT Long Term Goals - 04/12/18 1541  PT LONG TERM GOAL #1   Title  Patient will demonstrate improvement of at least 1 MMT strength grade in all muscle groups tested as deficient in order to improve patient's standing mechanics and stability.     Time  6    Period  Weeks    Status  New    Target Date  05/24/18      PT LONG TERM GOAL #2   Title  Patient will report ability to stand for at least 30 minutes without her back pain increasing above a 2/10 in order to prepare a meal at home with increased independence.     Time  6    Period  Weeks    Status  New    Target Date  05/24/18      PT LONG TERM GOAL #3   Title  Patient will demonstrate ability to maintain single limb stance for at least 5 seconds on each lower extremity indicating improved balance, and improved safety in order to navigate stairs at home.     Time  6    Period  Weeks    Status  New    Target Date  05/24/18             Plan - 04/12/18 1548    Clinical Impression Statement  Patient is a 73 year old female who presented to physical therapy with primary complaint of low back pain of many years duration. Upon examination, noted decreased strength in patient's bilateral lower extremities as well as decreased core strength with functional mobility. Palpation indicated that patient has increased muscular restrictions around periscapular muscles as  well ad in patient's bilateral glutes. Patient's balance and muscular stability were decreased as indicated with single limb stance on bilateral lower extremities. In addition, from subjective assessment and with functional movement patient's back pain appeared to be decreased with lumbar flexion activities. Patient would benefit from skilled physical therapy in order to address the abovementioned deficits and help patient increase her functional mobility.     History and Personal Factors relevant to plan of care:  HTN, history of TIAs    Clinical Presentation  Stable    Clinical Presentation due to:  FOTO, MMT, ROM, palpation, clinical judgement    Clinical Decision Making  Moderate    Rehab Potential  Good    Clinical Impairments Affecting Rehab Potential  Positive: motivated; Negative: chronicity of problem    PT Frequency  2x / week    PT Duration  6 weeks    PT Treatment/Interventions  ADLs/Self Care Home Management;Aquatic Therapy;Electrical Stimulation;Moist Heat;DME Instruction;Gait training;Stair training;Functional mobility training;Therapeutic activities;Therapeutic exercise;Balance training;Neuromuscular re-education;Patient/family education;Orthotic Fit/Training;Manual techniques;Passive range of motion;Dry needling;Taping    PT Next Visit Plan  Review evaluation and goals and initial HEP. Initiate core strengthening, functional lower extremity strengtheing, and focus on flexion based activities. Progress to balance activities as able. Postural strengthening exercises as well as stabilization exercises to improve patient's ability to maintain good posture with upper extremity movements.     PT Home Exercise Plan  Evaluation: single knee to chest 3 x 30 seconds each LE 1-2x/day    Consulted and Agree with Plan of Care  Patient       Patient will benefit from skilled therapeutic intervention in order to improve the following deficits and impairments:  Decreased balance, Decreased  endurance, Decreased mobility, Difficulty walking, Decreased range of motion, Improper body mechanics, Obesity, Decreased activity tolerance, Decreased strength, Increased fascial restricitons, Postural dysfunction, Pain  Visit  Diagnosis: Chronic bilateral low back pain without sciatica  Muscle weakness (generalized)  Other symptoms and signs involving the musculoskeletal system     Problem List Patient Active Problem List   Diagnosis Date Noted  . Sleep apnea 07/11/2017  . Pancreatic pseudocyst 05/10/2017  . Adrenal adenoma 05/10/2017  . Osteoporosis 11/21/2016  . Fibromyalgia 08/10/2016  . NASH (nonalcoholic steatohepatitis) 04/24/2015  . Orthostatic hypotension 09/19/2013  . Elevated transaminase level 09/19/2013  . HTN (hypertension), benign 09/03/2013  . Prediabetes 08/14/2013  . Depression 08/14/2013  . Fatty liver 08/14/2013  . Hypothyroid 10/23/2012  . Osteopenia 07/08/2012  . Morbid obesity ([place]) 07/08/2012  . Family history of rectal cancer 10/04/2011  . GERD (gastroesophageal reflux disease) 10/04/2011  . IBS (irritable bowel syndrome) 10/04/2011   Clarene Critchley PT, DPT 3:59 PM, 04/12/18 [place], [place], [address] Phone: [phone]   Fax:  [phone]  Name: Kristen Mack MRN: [id number] Date of Birth: September 22, 1945

## 2018-04-13 ENCOUNTER — Ambulatory Visit: Payer: PPO | Admitting: Family Medicine

## 2018-04-14 DIAGNOSIS — E119 Type 2 diabetes mellitus without complications: Secondary | ICD-10-CM | POA: Diagnosis not present

## 2018-04-14 LAB — HM DIABETES EYE EXAM

## 2018-04-17 ENCOUNTER — Encounter: Payer: Self-pay | Admitting: Family Medicine

## 2018-04-17 ENCOUNTER — Encounter (HOSPITAL_COMMUNITY): Payer: PPO

## 2018-04-19 DIAGNOSIS — G4733 Obstructive sleep apnea (adult) (pediatric): Secondary | ICD-10-CM | POA: Diagnosis not present

## 2018-04-20 ENCOUNTER — Ambulatory Visit (HOSPITAL_COMMUNITY): Payer: PPO | Admitting: Physical Therapy

## 2018-04-20 ENCOUNTER — Telehealth (HOSPITAL_COMMUNITY): Payer: Self-pay | Admitting: Family Medicine

## 2018-04-20 NOTE — Telephone Encounter (Signed)
04/20/18  pt called to cx said that she was sick this morning

## 2018-04-25 ENCOUNTER — Encounter (HOSPITAL_COMMUNITY): Payer: Self-pay

## 2018-04-25 ENCOUNTER — Ambulatory Visit (HOSPITAL_COMMUNITY): Payer: PPO

## 2018-04-25 DIAGNOSIS — M545 Low back pain: Secondary | ICD-10-CM | POA: Diagnosis not present

## 2018-04-25 DIAGNOSIS — R29898 Other symptoms and signs involving the musculoskeletal system: Secondary | ICD-10-CM

## 2018-04-25 DIAGNOSIS — M6281 Muscle weakness (generalized): Secondary | ICD-10-CM

## 2018-04-25 DIAGNOSIS — G8929 Other chronic pain: Secondary | ICD-10-CM

## 2018-04-25 NOTE — Patient Instructions (Signed)
Knee to Chest (Flexion)    Pull knee toward chest. Feel stretch in lower back or buttock area. Breathing deeply, Hold 30 seconds. Repeat with other knee. Repeat 3 times. Do 1-2 sessions per day.  http://gt2.exer.us/226   Copyright  VHI. All rights reserved.   Bracing With Leg March (Hook-Lying)    With neutral spine, tighten pelvic floor and abdominals and hold. Alternating legs, lift foot 10 inches and return to floor.  Repeat 10 times. Do 2 times a day.   Copyright  VHI. All rights reserved.   Bridge    Lie back, legs bent. Inhale, pressing hips up. Keeping ribs in, lengthen lower back. Exhale, rolling down along spine from top. Repeat 10 times. Do 2 sessions per day.  http://pm.exer.us/55   Copyright  VHI. All rights reserved.

## 2018-04-25 NOTE — Therapy (Signed)
Kristen Mack, [place], [address] Phone: [phone]   Fax:  [phone]  Physical Therapy Treatment  Patient Details  Name: Kristen Mack MRN: [id number] Date of Birth: 06-Jun-1945 Referring Provider (PT): Sallee Lange, MD   Encounter Date: 04/25/2018  PT End of Session - 04/25/18 1357    Visit Number  2    Number of Visits  13    Date for PT Re-Evaluation  05/24/18   Minireassess 05/03/18   Authorization Type  [employer]    Authorization Time Period  04/12/18 - 05/24/18    Authorization - Visit Number  2    Authorization - Number of Visits  10    PT Start Time  1350    PT Stop Time  1429    PT Time Calculation (min)  39 min    Activity Tolerance  Patient tolerated treatment well;No increased pain    Behavior During Therapy  WFL for tasks assessed/performed       Past Medical History:  Diagnosis Date  . Arthritis   . Asthma   . CFS (chronic fatigue syndrome)   . Cracked tooth    X2  RIGHT LOWER MOLAR AND LEFT UPPER MOLAR  . Fatty liver disease, nonalcoholic   . Fibromyalgia   . GERD (gastroesophageal reflux disease)   . History of adenomatous polyp of colon   . History of cardiac murmur    until age 84  . History of TIA (transient ischemic attack)    per CT scan   . Hypertension   . Hypothyroidism   . IBS (irritable bowel syndrome)   . Meniere disease   . OSA (obstructive sleep apnea)    CPAP intolerate -- per study 2010  moderate osa--  STATES HAS RAISED HOB  . Osteopenia   . PMB (postmenopausal bleeding)   . Prediabetes   . Sleep apnea 07/11/2017   Abnormal home sleep apnea test February 2019-CPAP ordered  . Tooth loose    LOWER FRONT    Past Surgical History:  Procedure Laterality Date  . [place]  . CHOLECYSTECTOMY  1995   and  REPAIR UMBILICAL HERNIA  . COLONOSCOPY N/A 04/19/2013   Procedure: COLONOSCOPY;  Surgeon: Rogene Houston, MD;  [facility];  Service: Endoscopy;  Laterality: N/A;    . ESOPHAGOGASTRODUODENOSCOPY  02-08-2003  . HYSTEROSCOPY W/D&C  11-14-2002   endometrial polypectomy  . HYSTEROSCOPY W/D&C N/A 04/05/2014   Procedure: DILATATION AND CURETTAGE /HYSTEROSCOPY, ENDOMETRIAL POLYPECTOMY;  Surgeon: Sanjuana Kava, MD;  Location: [place];  Service: Gynecology;  Laterality: N/A;  . LIVER BIOPSY  01-29-2004   benign    There were no vitals filed for this visit.  Subjective Assessment - 04/25/18 1353    Subjective  Pt reports increased fatigua following last flu shot almost 2 weeks ago.  Reports increased lower back pain Rt side following walking at [employer] Friday, current pain scale 2/10 seated and 7/10 standing.      Pertinent History  HTN, history of TIAs    Patient Stated Goals  Figuring out a way to cook supper without her back hurting    Currently in Pain?  Yes    Pain Score  2    2/10 seated; 7/10 standing   Pain Location  Back    Pain Orientation  Right;Lower    Pain Descriptors / Indicators  Aching;Sore;Cramping   cramping after standing  for long periods of time   Pain Type  Chronic pain    Pain Onset  More than a month ago    Pain Frequency  Intermittent    Aggravating Factors   weight bearing    Pain Relieving Factors  sitting wiht heat and massager    Effect of Pain on Daily Activities  limits         [facility] PT Assessment - 04/25/18 0001      Assessment   Medical Diagnosis  Lumbar Pain    Referring Provider (PT)  Sallee Lange, MD    Onset Date/Surgical Date  --   Many years ago   Next MD Visit  --   Sometime in March   Prior Therapy  None      Precautions   Precautions  None                   [facility] Adult PT Treatment/Exercise - 04/25/18 0001      Bed Mobility   Bed Mobility  Sit to Sidelying Left    Sit to Sidelying Left  Supervision/Verbal cueing   reviewed log rolling for mobility     Posture/Postural Control   Posture/Postural Control  Postural  limitations    Postural Limitations  Rounded Shoulders;Forward head      Exercises   Exercises  Lumbar      Lumbar Exercises: Stretches   Active Hamstring Stretch  Right;Left;2 reps;30 seconds    Active Hamstring Stretch Limitations  towel assistance    Single Knee to Chest Stretch  Right;Left;3 reps;30 seconds    Single Knee to Chest Stretch Limitations  towel assistnace      Lumbar Exercises: Seated   Sit to Stand  5 reps    Sit to Stand Limitations  no HHA, cueing for eccentric control      Lumbar Exercises: Supine   Ab Set  10 reps;3 seconds    AB Set Limitations  Counting outloud    Bent Knee Raise  10 reps;5 seconds    Bent Knee Raise Limitations  ab set, counting outloud    Bridge  10 reps;3 seconds             PT Education - 04/25/18 1403    Education Details  Reviewed goals, assured compliance wiht HEP wiht some cueing for mechanics (use of towel), bed mobility training.    Person(s) Educated  Patient    Methods  Explanation;Demonstration    Comprehension  Verbalized understanding;Returned demonstration       PT Short Term Goals - 04/12/18 1539      PT SHORT TERM GOAL #1   Title  Patient will report understanding and regular compliance of HEP to improve patient's strength, mobility, and decrease patient's pain.     Time  3    Period  Weeks    Status  New    Target Date  05/03/18      PT SHORT TERM GOAL #2   Title  Patient will report that her back pain has not exceeded a maximum of 6/10 over a 1 week period indicating improved tolerance to daily activities.     Time  3    Period  Weeks    Status  New    Target Date  05/03/18      PT SHORT TERM GOAL #3   Title  Patient will demonstrate improvement of at least 1/2 MMT strength grade in all muscle groups tested as deficient in order  to improve patient's standing mechanics and stability.     Time  3    Period  Weeks    Status  New    Target Date  05/03/18        PT Long Term Goals - 04/12/18 1541       PT LONG TERM GOAL #1   Title  Patient will demonstrate improvement of at least 1 MMT strength grade in all muscle groups tested as deficient in order to improve patient's standing mechanics and stability.     Time  6    Period  Weeks    Status  New    Target Date  05/24/18      PT LONG TERM GOAL #2   Title  Patient will report ability to stand for at least 30 minutes without her back pain increasing above a 2/10 in order to prepare a meal at home with increased independence.     Time  6    Period  Weeks    Status  New    Target Date  05/24/18      PT LONG TERM GOAL #3   Title  Patient will demonstrate ability to maintain single limb stance for at least 5 seconds on each lower extremity indicating improved balance, and improved safety in order to navigate stairs at home.     Time  6    Period  Weeks    Status  New    Target Date  05/24/18            Plan - 04/25/18 1407    Clinical Impression Statement  Reviewed goals and assured compliance wiht HEP.  Cueing to use towel for assistance to improve lumbar mobility.  Pt instructed proper bed mobility to reduce stress on lumbar musculature.  Therex focus with core strengthening with multmodal cueing to improve contraction.  Pt encouraged to countoutloud to improve breathing iwht ab sets.  EOS pt reports pain resolved, reviewed importance of compliance wiht HEP wiht verbalized understanding.      Rehab Potential  Good    Clinical Impairments Affecting Rehab Potential  Positive: motivated; Negative: chronicity of problem    PT Frequency  2x / week    PT Duration  6 weeks    PT Treatment/Interventions  ADLs/Self Care Home Management;Aquatic Therapy;Electrical Stimulation;Moist Heat;DME Instruction;Gait training;Stair training;Functional mobility training;Therapeutic activities;Therapeutic exercise;Balance training;Neuromuscular re-education;Patient/family education;Orthotic Fit/Training;Manual techniques;Passive range of motion;Dry  needling;Taping    PT Next Visit Plan  Continue core strengthening, functional lower extremity strengtheing, and focus on flexion based activities. Progress to balance activities as able. Postural strengthening exercises as well as stabilization exercises to improve patient's ability to maintain good posture with upper extremity movements.     PT Home Exercise Plan  Evaluation: single knee to chest 3 x 30 seconds each LE 1-2x/day 1/28: SKTC, bridge and bent knee raise       Patient will benefit from skilled therapeutic intervention in order to improve the following deficits and impairments:  Decreased balance, Decreased endurance, Decreased mobility, Difficulty walking, Decreased range of motion, Improper body mechanics, Obesity, Decreased activity tolerance, Decreased strength, Increased fascial restricitons, Postural dysfunction, Pain  Visit Diagnosis: Chronic bilateral low back pain without sciatica  Muscle weakness (generalized)  Other symptoms and signs involving the musculoskeletal system     Problem List Patient Active Problem List   Diagnosis Date Noted  . Sleep apnea 07/11/2017  . Pancreatic pseudocyst 05/10/2017  . Adrenal adenoma 05/10/2017  . Osteoporosis 11/21/2016  .  Fibromyalgia 08/10/2016  . NASH (nonalcoholic steatohepatitis) 04/24/2015  . Orthostatic hypotension 09/19/2013  . Elevated transaminase level 09/19/2013  . HTN (hypertension), benign 09/03/2013  . Prediabetes 08/14/2013  . Depression 08/14/2013  . Fatty liver 08/14/2013  . Hypothyroid 10/23/2012  . Osteopenia 07/08/2012  . Morbid obesity ([place]) 07/08/2012  . Family history of rectal cancer 10/04/2011  . GERD (gastroesophageal reflux disease) 10/04/2011  . IBS (irritable bowel syndrome) 10/04/2011   Ihor Austin, LPTA; CBIS [phone]  Aldona Lento 04/25/2018, 2:34 PM  [place] [address] [place], [place], [address] Phone: [phone]    Fax:  [phone]  Name: VLASTA BASKIN MRN: [id number] Date of Birth: 04-26-1945

## 2018-04-27 ENCOUNTER — Ambulatory Visit (HOSPITAL_COMMUNITY): Payer: PPO | Admitting: Physical Therapy

## 2018-04-27 ENCOUNTER — Encounter (HOSPITAL_COMMUNITY): Payer: Self-pay | Admitting: Physical Therapy

## 2018-04-27 DIAGNOSIS — M545 Low back pain, unspecified: Secondary | ICD-10-CM

## 2018-04-27 DIAGNOSIS — M6281 Muscle weakness (generalized): Secondary | ICD-10-CM

## 2018-04-27 DIAGNOSIS — R29898 Other symptoms and signs involving the musculoskeletal system: Secondary | ICD-10-CM

## 2018-04-27 DIAGNOSIS — G8929 Other chronic pain: Secondary | ICD-10-CM

## 2018-04-27 NOTE — Therapy (Signed)
[place], [place], [address] Phone: [phone]   Fax:  [phone]  Physical Therapy Treatment  Patient Details  Name: Kristen Mack MRN: [id number] Date of Birth: March 15, 1946 Referring Provider (PT): Sallee Lange, MD   Encounter Date: 04/27/2018  PT End of Session - 04/27/18 1408    Visit Number  3    Number of Visits  13    Date for PT Re-Evaluation  05/24/18   Minireassess 05/03/18   Authorization Type  [employer]    Authorization Time Period  04/12/18 - 05/24/18    Authorization - Visit Number  3    Authorization - Number of Visits  10    PT Start Time  6962   Patient arrived late   PT Stop Time  1430    PT Time Calculation (min)  34 min    Activity Tolerance  Patient tolerated treatment well;No increased pain    Behavior During Therapy  WFL for tasks assessed/performed       Past Medical History:  Diagnosis Date  . Arthritis   . Asthma   . CFS (chronic fatigue syndrome)   . Cracked tooth    X2  RIGHT LOWER MOLAR AND LEFT UPPER MOLAR  . Fatty liver disease, nonalcoholic   . Fibromyalgia   . GERD (gastroesophageal reflux disease)   . History of adenomatous polyp of colon   . History of cardiac murmur    until age 10  . History of TIA (transient ischemic attack)    per CT scan   . Hypertension   . Hypothyroidism   . IBS (irritable bowel syndrome)   . Meniere disease   . OSA (obstructive sleep apnea)    CPAP intolerate -- per study 2010  moderate osa--  STATES HAS RAISED HOB  . Osteopenia   . PMB (postmenopausal bleeding)   . Prediabetes   . Sleep apnea 07/11/2017   Abnormal home sleep apnea test February 2019-CPAP ordered  . Tooth loose    LOWER FRONT    Past Surgical History:  Procedure Laterality Date  . [place]  . CHOLECYSTECTOMY  1995   and  REPAIR UMBILICAL HERNIA  . COLONOSCOPY N/A 04/19/2013   Procedure: COLONOSCOPY;  Surgeon: Rogene Houston, MD;  [facility];  Service: Endoscopy;  Laterality: N/A;    . ESOPHAGOGASTRODUODENOSCOPY  02-08-2003  . HYSTEROSCOPY W/D&C  11-14-2002   endometrial polypectomy  . HYSTEROSCOPY W/D&C N/A 04/05/2014   Procedure: DILATATION AND CURETTAGE /HYSTEROSCOPY, ENDOMETRIAL POLYPECTOMY;  Surgeon: Sanjuana Kava, MD;  Location: [facility];  Service: Gynecology;  Laterality: N/A;  . LIVER BIOPSY  01-29-2004   benign    There were no vitals filed for this visit.  Subjective Assessment - 04/27/18 1358    Subjective  Patient reported she is not having any pain today, but that she has some discomfort in her head. She reported that she feels the hamstring stretch has eliminated her back pain.     Pertinent History  HTN, history of TIAs    Patient Stated Goals  Figuring out a way to cook supper without her back hurting    Currently in Pain?  No/denies                       [facility] Adult PT Treatment/Exercise - 04/27/18 0001      Lumbar Exercises: Stretches   Active  Hamstring Stretch  Right;Left;30 seconds;3 reps    Active Hamstring Stretch Limitations  towel assistance    Single Knee to Chest Stretch  Right;Left;3 reps;30 seconds    Single Knee to Chest Stretch Limitations  towel assistnace      Lumbar Exercises: Standing   Forward Lunge  5 reps    Forward Lunge Limitations  Each LE with demonstration and verbal cues    Other Standing Lumbar Exercises  Postural strengthening: rows, shoulder extension, scapular retraction with RTB x 10 each exercise      Lumbar Exercises: Seated   Sit to Stand  5 reps    Sit to Stand Limitations  no HHA, cueing for eccentric control    Other Seated Lumbar Exercises  Seated marching with abdominal set x 20 alternating legs 3'' holds     Other Seated Lumbar Exercises  Seated abdominal set 2x10 3-5 second holds. Posterior shoulder rolls x 15 with breathing incorporated               PT Short Term Goals - 04/12/18  1539      PT SHORT TERM GOAL #1   Title  Patient will report understanding and regular compliance of HEP to improve patient's strength, mobility, and decrease patient's pain.     Time  3    Period  Weeks    Status  New    Target Date  05/03/18      PT SHORT TERM GOAL #2   Title  Patient will report that her back pain has not exceeded a maximum of 6/10 over a 1 week period indicating improved tolerance to daily activities.     Time  3    Period  Weeks    Status  New    Target Date  05/03/18      PT SHORT TERM GOAL #3   Title  Patient will demonstrate improvement of at least 1/2 MMT strength grade in all muscle groups tested as deficient in order to improve patient's standing mechanics and stability.     Time  3    Period  Weeks    Status  New    Target Date  05/03/18        PT Long Term Goals - 04/12/18 1541      PT LONG TERM GOAL #1   Title  Patient will demonstrate improvement of at least 1 MMT strength grade in all muscle groups tested as deficient in order to improve patient's standing mechanics and stability.     Time  6    Period  Weeks    Status  New    Target Date  05/24/18      PT LONG TERM GOAL #2   Title  Patient will report ability to stand for at least 30 minutes without her back pain increasing above a 2/10 in order to prepare a meal at home with increased independence.     Time  6    Period  Weeks    Status  New    Target Date  05/24/18      PT LONG TERM GOAL #3   Title  Patient will demonstrate ability to maintain single limb stance for at least 5 seconds on each lower extremity indicating improved balance, and improved safety in order to navigate stairs at home.     Time  6    Period  Weeks    Status  New    Target Date  05/24/18  Plan - 04/27/18 1433    Clinical Impression Statement  This session continued with established plan of care. This session progressed patient with postural strengthening exercises in order to improve  patient's posture during functional activities. Patient has made good progress in physical therapy so far. Patient reported some issues with allergies and some difficulty breathing. Patient used her inhaler and her SpO2 was found to be 98%. Modified exercises to be sitting and standing rather than supine in order to improve patient's breathing. Patient stated she felt good at end of session with no reports of pain. She would benefit from continued skilled physical therapy in order to continue progressing towards functional goals.     Rehab Potential  Good    Clinical Impairments Affecting Rehab Potential  Positive: motivated; Negative: chronicity of problem    PT Frequency  2x / week    PT Duration  6 weeks    PT Treatment/Interventions  ADLs/Self Care Home Management;Aquatic Therapy;Electrical Stimulation;Moist Heat;DME Instruction;Gait training;Stair training;Functional mobility training;Therapeutic activities;Therapeutic exercise;Balance training;Neuromuscular re-education;Patient/family education;Orthotic Fit/Training;Manual techniques;Passive range of motion;Dry needling;Taping    PT Next Visit Plan  Modify exercises as needed for breathing. Add postural strengthening as able to patient's HEP once patient demonstrates proper form. Continue core strengthening, functional lower extremity strengtheing, and focus on flexion based activities. Progress to balance activities as able. Postural strengthening exercises as well as stabilization exercises to improve patient's ability to maintain good posture with upper extremity movements.     PT Home Exercise Plan  Evaluation: single knee to chest 3 x 30 seconds each LE 1-2x/day 1/28: SKTC, bridge and bent knee raise       Patient will benefit from skilled therapeutic intervention in order to improve the following deficits and impairments:  Decreased balance, Decreased endurance, Decreased mobility, Difficulty walking, Decreased range of motion, Improper body  mechanics, Obesity, Decreased activity tolerance, Decreased strength, Increased fascial restricitons, Postural dysfunction, Pain  Visit Diagnosis: Chronic bilateral low back pain without sciatica  Muscle weakness (generalized)  Other symptoms and signs involving the musculoskeletal system     Problem List Patient Active Problem List   Diagnosis Date Noted  . Sleep apnea 07/11/2017  . Pancreatic pseudocyst 05/10/2017  . Adrenal adenoma 05/10/2017  . Osteoporosis 11/21/2016  . Fibromyalgia 08/10/2016  . NASH (nonalcoholic steatohepatitis) 04/24/2015  . Orthostatic hypotension 09/19/2013  . Elevated transaminase level 09/19/2013  . HTN (hypertension), benign 09/03/2013  . Prediabetes 08/14/2013  . Depression 08/14/2013  . Fatty liver 08/14/2013  . Hypothyroid 10/23/2012  . Osteopenia 07/08/2012  . Morbid obesity ([place]) 07/08/2012  . Family history of rectal cancer 10/04/2011  . GERD (gastroesophageal reflux disease) 10/04/2011  . IBS (irritable bowel syndrome) 10/04/2011   Clarene Critchley PT, DPT 2:36 PM, 04/27/18 [place], [place], [address] Phone: [phone]   Fax:  [phone]  Name: Kristen Mack MRN: [id number] Date of Birth: 1946/01/04

## 2018-05-01 ENCOUNTER — Telehealth (HOSPITAL_COMMUNITY): Payer: Self-pay | Admitting: Family Medicine

## 2018-05-01 NOTE — Telephone Encounter (Signed)
05/01/18  pt called to cx not feeling well... leave Thursday appt as is for now

## 2018-05-02 ENCOUNTER — Telehealth: Payer: Self-pay | Admitting: Family Medicine

## 2018-05-02 ENCOUNTER — Ambulatory Visit (HOSPITAL_COMMUNITY): Payer: PPO

## 2018-05-02 DIAGNOSIS — R7303 Prediabetes: Secondary | ICD-10-CM

## 2018-05-02 DIAGNOSIS — E038 Other specified hypothyroidism: Secondary | ICD-10-CM

## 2018-05-02 DIAGNOSIS — R748 Abnormal levels of other serum enzymes: Secondary | ICD-10-CM

## 2018-05-02 NOTE — Telephone Encounter (Signed)
Pt has follow up on 3/24 and would like to have lab work done.

## 2018-05-02 NOTE — Telephone Encounter (Signed)
Last labs 11/19: Lipid, Liver, CBC, TSH, Met 7 and HgbA1c 

## 2018-05-04 ENCOUNTER — Ambulatory Visit (INDEPENDENT_AMBULATORY_CARE_PROVIDER_SITE_OTHER): Payer: PPO | Admitting: Family Medicine

## 2018-05-04 ENCOUNTER — Ambulatory Visit (HOSPITAL_COMMUNITY): Payer: PPO | Admitting: Physical Therapy

## 2018-05-04 ENCOUNTER — Telehealth (HOSPITAL_COMMUNITY): Payer: Self-pay | Admitting: Physical Therapy

## 2018-05-04 ENCOUNTER — Encounter: Payer: Self-pay | Admitting: Family Medicine

## 2018-05-04 VITALS — BP 124/78 | HR 67 | Temp 97.5°F | Ht 61.0 in | Wt 215.0 lb

## 2018-05-04 DIAGNOSIS — J019 Acute sinusitis, unspecified: Secondary | ICD-10-CM | POA: Diagnosis not present

## 2018-05-04 MED ORDER — PREDNISONE 20 MG PO TABS: ORAL_TABLET | ORAL | 0 refills | Status: DC

## 2018-05-04 MED ORDER — AZITHROMYCIN 250 MG PO TABS: ORAL_TABLET | ORAL | 0 refills | Status: DC

## 2018-05-04 NOTE — Telephone Encounter (Signed)
pt called to cancel her appt due to she is sick.

## 2018-05-04 NOTE — Telephone Encounter (Signed)
Patient is aware of all.

## 2018-05-04 NOTE — Telephone Encounter (Signed)
Liver enzyme, TSH, A1c-elevated liver enzymes, diabetes, hypothyroidism

## 2018-05-04 NOTE — Telephone Encounter (Signed)
I have placed the order. I have called the pt twice number listed can not be completed as dialed.

## 2018-05-04 NOTE — Progress Notes (Signed)
   Subjective:    Patient ID: Kristen Mack, female    DOB: 03/24/1946, 73 y.o.   MRN: [id number]  HPI  Patient is here today with complaints of cough,chest congestion,wheezing,,runny nose,headache,sore throat,fever,dizziness.on going for the last 3-4 days. She has been using her inhaler,nebulizer,tussin,vit c lysine,alavert D,tussin Dm at night. Patient with significant chest congestion coughing denies high fever chills sweats relates not feeling good relates some wheezing denies no vomiting Review of Systems  Constitutional: Negative for activity change and fever.  HENT: Positive for congestion and rhinorrhea. Negative for ear pain.   Eyes: Negative for discharge.  Respiratory: Positive for cough. Negative for shortness of breath and wheezing.   Cardiovascular: Negative for chest pain.       Objective:   Physical Exam Vitals signs and nursing note reviewed.  Constitutional:      Appearance: She is well-developed.  HENT:     Head: Normocephalic.     Nose: Nose normal.     Mouth/Throat:     Pharynx: No oropharyngeal exudate.  Neck:     Musculoskeletal: Neck supple.  Cardiovascular:     Rate and Rhythm: Normal rate.     Heart sounds: Normal heart sounds. No murmur.  Pulmonary:     Effort: Pulmonary effort is normal.     Breath sounds: Normal breath sounds. No wheezing.  Lymphadenopathy:     Cervical: No cervical adenopathy.  Skin:    General: Skin is warm and dry.           Assessment & Plan:  Acute rhinosinusitis Bronchitis Laryngitis Viral syndrome Secondary rhinosinusitis Antibiotic prescribed warning signs discussed follow-up if problems

## 2018-05-06 ENCOUNTER — Other Ambulatory Visit: Payer: Self-pay | Admitting: Family Medicine

## 2018-05-08 ENCOUNTER — Telehealth (HOSPITAL_COMMUNITY): Payer: Self-pay | Admitting: Family Medicine

## 2018-05-08 ENCOUNTER — Telehealth: Payer: Self-pay | Admitting: Family Medicine

## 2018-05-08 NOTE — Telephone Encounter (Signed)
Pt contacted and informed that it would be ok to taper prednisone as suggested and to not go to PT tomorrow but hopefully next time. Pt verbalized understanding.

## 2018-05-08 NOTE — Telephone Encounter (Signed)
Pt is calling to see if she would be ok to go to PT tomorrow or if she is still contagious she is coughing and still wheezing.   Also she is worried about stopping her prednisone cold [place]. She is wanting to taper off the medication by taking 20 mg today. 10 mg tomorrow, and 5 mg the next two days.

## 2018-05-08 NOTE — Telephone Encounter (Signed)
It would be fine to taper the prednisone as she suggested I would not going to PT tomorrow but hopefully the next turn she could

## 2018-05-08 NOTE — Telephone Encounter (Signed)
05/08/18  pt left a message to cx said that her dr advised her to stay out because she has a type of flu

## 2018-05-08 NOTE — Telephone Encounter (Signed)
Please advise. FYI: husband has appt today. Thank you

## 2018-05-09 ENCOUNTER — Encounter (HOSPITAL_COMMUNITY): Payer: PPO

## 2018-05-10 ENCOUNTER — Telehealth (HOSPITAL_COMMUNITY): Payer: Self-pay | Admitting: Physical Therapy

## 2018-05-10 NOTE — Telephone Encounter (Signed)
She is still sick with the flu will try to be here next week

## 2018-05-11 ENCOUNTER — Ambulatory Visit (HOSPITAL_COMMUNITY): Payer: PPO | Admitting: Physical Therapy

## 2018-05-16 ENCOUNTER — Ambulatory Visit (HOSPITAL_COMMUNITY): Payer: PPO | Admitting: Physical Therapy

## 2018-05-16 ENCOUNTER — Telehealth (HOSPITAL_COMMUNITY): Payer: Self-pay | Admitting: Family Medicine

## 2018-05-16 NOTE — Telephone Encounter (Signed)
05/16/18  pt left a message to cx - said that she has been dizzy and trying to see if it would go away but it hasn't and she can't drive and has no one to bring her

## 2018-05-18 ENCOUNTER — Ambulatory Visit (HOSPITAL_COMMUNITY): Payer: PPO | Admitting: Physical Therapy

## 2018-05-18 ENCOUNTER — Telehealth (HOSPITAL_COMMUNITY): Payer: Self-pay | Admitting: Physical Therapy

## 2018-05-18 ENCOUNTER — Telehealth (HOSPITAL_COMMUNITY): Payer: Self-pay | Admitting: Family Medicine

## 2018-05-18 NOTE — Telephone Encounter (Signed)
05/18/18  Pt left a message to cx and just said that she couldn't come in.

## 2018-05-18 NOTE — Telephone Encounter (Signed)
Pt called stating her daughters don't want her to go out today due to possible weather. She will call us back later and let us know if she will come or not. NF 11:20 am

## 2018-05-20 DIAGNOSIS — G4733 Obstructive sleep apnea (adult) (pediatric): Secondary | ICD-10-CM | POA: Diagnosis not present

## 2018-05-23 ENCOUNTER — Ambulatory Visit (HOSPITAL_COMMUNITY): Payer: PPO | Attending: Family Medicine | Admitting: Physical Therapy

## 2018-05-23 ENCOUNTER — Encounter (HOSPITAL_COMMUNITY): Payer: Self-pay | Admitting: Physical Therapy

## 2018-05-23 DIAGNOSIS — M545 Low back pain: Secondary | ICD-10-CM | POA: Diagnosis not present

## 2018-05-23 DIAGNOSIS — R29898 Other symptoms and signs involving the musculoskeletal system: Secondary | ICD-10-CM | POA: Insufficient documentation

## 2018-05-23 DIAGNOSIS — G8929 Other chronic pain: Secondary | ICD-10-CM

## 2018-05-23 DIAGNOSIS — M6281 Muscle weakness (generalized): Secondary | ICD-10-CM | POA: Diagnosis not present

## 2018-05-23 NOTE — Therapy (Signed)
[place], [place], [address] Phone: [phone]   Fax:  [phone]  Physical Therapy Treatment / Progress Note  Patient Details  Name: Kristen Mack MRN: [id number] Date of Birth: 1945/10/29 Referring Provider (PT): Sallee Lange, MD   Encounter Date: 05/23/2018   Progress Note Reporting Period 04/12/18 to 05/23/18  See note below for Objective Data and Assessment of Progress/Goals.       PT End of Session - 05/23/18 1405    Visit Number  4    Number of Visits  13    Date for PT Re-Evaluation  06/20/18    Authorization Type  [employer]    Authorization Time Period  04/12/18 - 05/24/18    Authorization - Visit Number  4    Authorization - Number of Visits  10    PT Start Time  1324    PT Stop Time  1433    PT Time Calculation (min)  41 min    Activity Tolerance  Patient tolerated treatment well;No increased pain    Behavior During Therapy  WFL for tasks assessed/performed       Past Medical History:  Diagnosis Date  . Arthritis   . Asthma   . CFS (chronic fatigue syndrome)   . Cracked tooth    X2  RIGHT LOWER MOLAR AND LEFT UPPER MOLAR  . Fatty liver disease, nonalcoholic   . Fibromyalgia   . GERD (gastroesophageal reflux disease)   . History of adenomatous polyp of colon   . History of cardiac murmur    until age 76  . History of TIA (transient ischemic attack)    per CT scan   . Hypertension   . Hypothyroidism   . IBS (irritable bowel syndrome)   . Meniere disease   . OSA (obstructive sleep apnea)    CPAP intolerate -- per study 2010  moderate osa--  STATES HAS RAISED HOB  . Osteopenia   . PMB (postmenopausal bleeding)   . Prediabetes   . Sleep apnea 07/11/2017   Abnormal home sleep apnea test February 2019-CPAP ordered  . Tooth loose    LOWER FRONT    Past Surgical History:  Procedure Laterality Date  . [place]  . CHOLECYSTECTOMY  1995   and  REPAIR UMBILICAL HERNIA  . COLONOSCOPY N/A 04/19/2013   Procedure: COLONOSCOPY;  Surgeon: Rogene Houston, MD;  [facility];  Service: Endoscopy;  Laterality: N/A;    . ESOPHAGOGASTRODUODENOSCOPY  02-08-2003  . HYSTEROSCOPY W/D&C  11-14-2002   endometrial polypectomy  . HYSTEROSCOPY W/D&C N/A 04/05/2014   Procedure: DILATATION AND CURETTAGE /HYSTEROSCOPY, ENDOMETRIAL POLYPECTOMY;  Surgeon: Sanjuana Kava, MD;  Location: [facility];  Service: Gynecology;  Laterality: N/A;  . LIVER BIOPSY  01-29-2004   benign    There were no vitals filed for this visit.  Subjective Assessment - 05/23/18 1359    Subjective  Patient stated she had the flu and that she has not been able to do her exercises, but feels she would benefit from continued therapy. Patient reported a maximum of 8/10 pain over the last week.     Pertinent History  HTN, history of TIAs    How long can you sit comfortably?  Not limited    How long can you stand comfortably?  5-10 minutes    How long can you walk comfortably?  3 minutes    Patient Stated  Goals  Figuring out a way to cook supper without her back hurting    Currently in Pain?  No/denies         [facility] PT Assessment - 05/23/18 0001      Assessment   Medical Diagnosis  Lumbar Pain    Referring Provider (PT)  Sallee Lange, MD      Observation/Other Assessments   Focus on Therapeutic Outcomes (FOTO)   48% limited      Strength   Right Hip Flexion  4/5   was 4-   Right Hip Extension  3-/5   was 3-   Right Hip ABduction  4/5   was 3+   Left Hip Flexion  4+/5   was 3+   Left Hip Extension  3-/5   was 3-   Left Hip ABduction  4/5   was 3+   Right Knee Flexion  4+/5   was 4+   Right Knee Extension  4/5   was 4   Left Knee Flexion  4+/5   was 4+   Left Knee Extension  4/5   was 4   Right Ankle Dorsiflexion  4+/5   was 4+   Left Ankle Dorsiflexion  4+/5   was 4+     Static Standing Balance   Static Standing - Balance  Support  No upper extremity supported    Static Standing Balance -  Activities   Single Leg Stance - Right Leg;Single Leg Stance - Left Leg    Static Standing - Comment/# of Minutes  2 seconds each lower extremity                   [facility] Adult PT Treatment/Exercise - 05/23/18 0001      Lumbar Exercises: Stretches   Single Knee to Chest Stretch  Right;Left;3 reps;30 seconds      Lumbar Exercises: Standing   Other Standing Lumbar Exercises  Postural strengthening: rows, shoulder extension, scapular retraction with RTB x 10 each exercise      Lumbar Exercises: Seated   Sit to Stand  5 reps   2x5 without UE assist   Other Seated Lumbar Exercises  Seated marching with abdominal set x 20 alternating legs 3'' holds       Lumbar Exercises: Supine   Ab Set  10 reps;3 seconds      Lumbar Exercises: Sidelying   Clam  Right;Left;15 reps;2 seconds             PT Education - 05/23/18 1436    Education Details  Discussed re-assessment findings and plan to continue with therapy. Educated to continue HEP.     Person(s) Educated  Patient    Methods  Explanation;Handout    Comprehension  Verbalized understanding       PT Short Term Goals - 05/23/18 1413      PT SHORT TERM GOAL #1   Title  Patient will report understanding and regular compliance of HEP to improve patient's strength, mobility, and decrease patient's pain.     Time  2    Period  Weeks    Status  On-going    Target Date  06/06/18      PT SHORT TERM GOAL #2   Title  Patient will report that her back pain has not exceeded a maximum of 6/10 over a 1 week period indicating improved tolerance to daily activities.     Baseline  05/23/18: Patient reported a maximum of 8/10.     Time  2    Period  Weeks    Status  On-going    Target Date  06/06/18      PT SHORT TERM GOAL #3   Title  Patient will demonstrate improvement of at least 1/2 MMT strength grade in all muscle groups tested as deficient in order to  improve patient's standing mechanics and stability.     Baseline  05/23/18: Patient achieved some, but not all muscle groups    Time  2    Period  Weeks    Status  Partially Met    Target Date  06/06/18        PT Long Term Goals - 05/23/18 1414      PT LONG TERM GOAL #1   Title  Patient will demonstrate improvement of at least 1 MMT strength grade in all muscle groups tested as deficient in order to improve patient's standing mechanics and stability.     Baseline  See MMT    Time  4    Period  Weeks    Status  On-going    Target Date  06/20/18      PT LONG TERM GOAL #2   Title  Patient will report ability to stand for at least 30 minutes without her back pain increasing above a 2/10 in order to prepare a meal at home with increased independence.     Baseline  05/23/18: Patient reported ability to stand for no more than 10 minutes    Time  4    Period  Weeks    Status  On-going    Target Date  06/20/18      PT LONG TERM GOAL #3   Title  Patient will demonstrate ability to maintain single limb stance for at least 5 seconds on each lower extremity indicating improved balance, and improved safety in order to navigate stairs at home.     Baseline  05/23/18: See objective measures    Time  4    Period  Weeks    Status  On-going    Target Date  06/20/18            Plan - 05/23/18 1447    Clinical Impression Statement  Patient returned to therapy for the first time following having the flu for several weeks. Performed a re-assessment and patient continued to demonstrate deficits in strength, balance, and overall functional mobility. Therapist feels that patient would benefit from continued skilled physical therapy as she was previously limited in attendance due to being sick. Therapist is extending patient's therapy for an additional 4 weeks to make up for missed appointments. Remainder of session focused on lower extremity strengthening, core strengthening, and postural  strengthening. Patient would benefit from continued skilled physical therapy in order to continue progressing towards functional goals.     Rehab Potential  Good    Clinical Impairments Affecting Rehab Potential  Positive: motivated; Negative: chronicity of problem    PT Frequency  2x / week    PT Duration  4 weeks    PT Treatment/Interventions  ADLs/Self Care Home Management;Aquatic Therapy;Electrical Stimulation;Moist Heat;DME Instruction;Gait training;Stair training;Functional mobility training;Therapeutic activities;Therapeutic exercise;Balance training;Neuromuscular re-education;Patient/family education;Orthotic Fit/Training;Manual techniques;Passive range of motion;Dry needling;Taping    PT Next Visit Plan  Modify exercises as needed for breathing. Add postural strengthening as able to patient's HEP once patient demonstrates proper form. Continue core strengthening, functional lower extremity strengtheing, and focus on flexion based activities. Progress to balance activities as able. Postural strengthening exercises as well as  stabilization exercises to improve patient's ability to maintain good posture with upper extremity movements.     PT Home Exercise Plan  Evaluation: single knee to chest 3 x 30 seconds each LE 1-2x/day 1/28: SKTC, bridge and bent knee raise    Consulted and Agree with Plan of Care  Patient       Patient will benefit from skilled therapeutic intervention in order to improve the following deficits and impairments:  Decreased balance, Decreased endurance, Decreased mobility, Difficulty walking, Decreased range of motion, Improper body mechanics, Obesity, Decreased activity tolerance, Decreased strength, Increased fascial restricitons, Postural dysfunction, Pain  Visit Diagnosis: Chronic bilateral low back pain without sciatica  Muscle weakness (generalized)  Other symptoms and signs involving the musculoskeletal system     Problem List Patient Active Problem List    Diagnosis Date Noted  . Sleep apnea 07/11/2017  . Pancreatic pseudocyst 05/10/2017  . Adrenal adenoma 05/10/2017  . Osteoporosis 11/21/2016  . Fibromyalgia 08/10/2016  . NASH (nonalcoholic steatohepatitis) 04/24/2015  . Orthostatic hypotension 09/19/2013  . Elevated transaminase level 09/19/2013  . HTN (hypertension), benign 09/03/2013  . Prediabetes 08/14/2013  . Depression 08/14/2013  . Fatty liver 08/14/2013  . Hypothyroid 10/23/2012  . Osteopenia 07/08/2012  . Morbid obesity ([place]) 07/08/2012  . Family history of rectal cancer 10/04/2011  . GERD (gastroesophageal reflux disease) 10/04/2011  . IBS (irritable bowel syndrome) 10/04/2011   Clarene Critchley PT, DPT 2:51 PM, 05/23/18 [place], [place], [address] Phone: [phone]   Fax:  [phone]  Name: Kristen Mack MRN: [id number] Date of Birth: Jun 26, 1945

## 2018-05-24 ENCOUNTER — Other Ambulatory Visit: Payer: Self-pay

## 2018-05-24 MED ORDER — GLUCOSE BLOOD VI STRP: ORAL_STRIP | 1 refills | Status: DC

## 2018-05-25 ENCOUNTER — Ambulatory Visit (HOSPITAL_COMMUNITY): Payer: PPO | Admitting: Physical Therapy

## 2018-05-25 ENCOUNTER — Encounter (HOSPITAL_COMMUNITY): Payer: Self-pay | Admitting: Physical Therapy

## 2018-05-25 DIAGNOSIS — R29898 Other symptoms and signs involving the musculoskeletal system: Secondary | ICD-10-CM

## 2018-05-25 DIAGNOSIS — G8929 Other chronic pain: Secondary | ICD-10-CM

## 2018-05-25 DIAGNOSIS — M545 Low back pain: Principal | ICD-10-CM

## 2018-05-25 DIAGNOSIS — M6281 Muscle weakness (generalized): Secondary | ICD-10-CM

## 2018-05-25 NOTE — Therapy (Signed)
[place], [place], [address] Phone: [phone]   Fax:  [phone]  Physical Therapy Treatment  Patient Details  Name: Kristen Mack MRN: [id number] Date of Birth: 1945-10-11 Referring Provider (PT): Sallee Lange, MD   Encounter Date: 05/25/2018  PT End of Session - 05/25/18 1452    Visit Number  5    Number of Visits  13    Date for PT Re-Evaluation  06/20/18    Authorization Type  [employer]    Authorization Time Period  04/12/18 - 05/24/18    Authorization - Visit Number  1    Authorization - Number of Visits  10    PT Start Time  3354   Patient arrived late   PT Stop Time  1515    PT Time Calculation (min)  34 min    Activity Tolerance  Patient tolerated treatment well;No increased pain    Behavior During Therapy  WFL for tasks assessed/performed       Past Medical History:  Diagnosis Date  . Arthritis   . Asthma   . CFS (chronic fatigue syndrome)   . Cracked tooth    X2  RIGHT LOWER MOLAR AND LEFT UPPER MOLAR  . Fatty liver disease, nonalcoholic   . Fibromyalgia   . GERD (gastroesophageal reflux disease)   . History of adenomatous polyp of colon   . History of cardiac murmur    until age 64  . History of TIA (transient ischemic attack)    per CT scan   . Hypertension   . Hypothyroidism   . IBS (irritable bowel syndrome)   . Meniere disease   . OSA (obstructive sleep apnea)    CPAP intolerate -- per study 2010  moderate osa--  STATES HAS RAISED HOB  . Osteopenia   . PMB (postmenopausal bleeding)   . Prediabetes   . Sleep apnea 07/11/2017   Abnormal home sleep apnea test February 2019-CPAP ordered  . Tooth loose    LOWER FRONT    Past Surgical History:  Procedure Laterality Date  . [place]  . CHOLECYSTECTOMY  1995   and  REPAIR UMBILICAL HERNIA  . COLONOSCOPY N/A 04/19/2013   Procedure: COLONOSCOPY;  Surgeon: Rogene Houston, MD;  [facility];  Service: Endoscopy;  Laterality: N/A;    . ESOPHAGOGASTRODUODENOSCOPY  02-08-2003  . HYSTEROSCOPY W/D&C  11-14-2002   endometrial polypectomy  . HYSTEROSCOPY W/D&C N/A 04/05/2014   Procedure: DILATATION AND CURETTAGE /HYSTEROSCOPY, ENDOMETRIAL POLYPECTOMY;  Surgeon: Sanjuana Kava, MD;  Location: [facility];  Service: Gynecology;  Laterality: N/A;  . LIVER BIOPSY  01-29-2004   benign    There were no vitals filed for this visit.  Subjective Assessment - 05/25/18 1445    Subjective  Patient stated that her back is bothering her today which she reported was due to being on her feet the day before    Pertinent History  HTN, history of TIAs    How long can you sit comfortably?  Not limited    How long can you stand comfortably?  5-10 minutes    How long can you walk comfortably?  3 minutes    Patient Stated Goals  Figuring out a way to cook supper without her back hurting    Currently in Pain?  Yes    Pain Score  6     Pain Location  Back  Pain Orientation  Mid    Pain Descriptors / Indicators  Aching    Pain Type  Chronic pain                       [facility] Adult PT Treatment/Exercise - 05/25/18 0001      Lumbar Exercises: Stretches   Single Knee to Chest Stretch  Right;Left;3 reps;30 seconds      Lumbar Exercises: Standing   Forward Lunge  10 reps    Forward Lunge Limitations  Each Lower extremity with bilateral UE support    Other Standing Lumbar Exercises  Postural strengthening: rows, shoulder extension, scapular retraction with RTB x 10 each exercise    Other Standing Lumbar Exercises  Y Liftoff x15 with frequent verbal cues for improved form      Lumbar Exercises: Seated   Sit to Stand  5 reps   2x5 without UEs     Lumbar Exercises: Supine   Bent Knee Raise  10 reps;5 seconds    Bent Knee Raise Limitations  ab set, counting outloud             PT Education - 05/25/18 1521    Education Details  Discussed purpose and  technique of interventions throughout therapy session.     Person(s) Educated  Patient    Methods  Explanation    Comprehension  Verbalized understanding       PT Short Term Goals - 05/23/18 1413      PT SHORT TERM GOAL #1   Title  Patient will report understanding and regular compliance of HEP to improve patient's strength, mobility, and decrease patient's pain.     Time  2    Period  Weeks    Status  On-going    Target Date  06/06/18      PT SHORT TERM GOAL #2   Title  Patient will report that her back pain has not exceeded a maximum of 6/10 over a 1 week period indicating improved tolerance to daily activities.     Baseline  05/23/18: Patient reported a maximum of 8/10.     Time  2    Period  Weeks    Status  On-going    Target Date  06/06/18      PT SHORT TERM GOAL #3   Title  Patient will demonstrate improvement of at least 1/2 MMT strength grade in all muscle groups tested as deficient in order to improve patient's standing mechanics and stability.     Baseline  05/23/18: Patient achieved some, but not all muscle groups    Time  2    Period  Weeks    Status  Partially Met    Target Date  06/06/18        PT Long Term Goals - 05/23/18 1414      PT LONG TERM GOAL #1   Title  Patient will demonstrate improvement of at least 1 MMT strength grade in all muscle groups tested as deficient in order to improve patient's standing mechanics and stability.     Baseline  See MMT    Time  4    Period  Weeks    Status  On-going    Target Date  06/20/18      PT LONG TERM GOAL #2   Title  Patient will report ability to stand for at least 30 minutes without her back pain increasing above a 2/10 in order to prepare a meal at home  with increased independence.     Baseline  05/23/18: Patient reported ability to stand for no more than 10 minutes    Time  4    Period  Weeks    Status  On-going    Target Date  06/20/18      PT LONG TERM GOAL #3   Title  Patient will demonstrate  ability to maintain single limb stance for at least 5 seconds on each lower extremity indicating improved balance, and improved safety in order to navigate stairs at home.     Baseline  05/23/18: See objective measures    Time  4    Period  Weeks    Status  On-going    Target Date  06/20/18            Plan - 05/25/18 1530    Clinical Impression Statement  This session patient arrived late and therefore session was limited due to this. Continued with established plan of care. Patient reported increased pain this session which she attributed to being on her feet a lot the day before therefore modified exercises accordingly. This session added Y liftoff, continued with lunges this session as well. Patient performed all exercises at a slow pace and required rest breaks intermittently to focus on breathing. Patient would benefit from continued skilled physical therapy in order to continue progressing towards functional goals.     Rehab Potential  Good    Clinical Impairments Affecting Rehab Potential  Positive: motivated; Negative: chronicity of problem    PT Frequency  2x / week    PT Duration  4 weeks    PT Treatment/Interventions  ADLs/Self Care Home Management;Aquatic Therapy;Electrical Stimulation;Moist Heat;DME Instruction;Gait training;Stair training;Functional mobility training;Therapeutic activities;Therapeutic exercise;Balance training;Neuromuscular re-education;Patient/family education;Orthotic Fit/Training;Manual techniques;Passive range of motion;Dry needling;Taping    PT Next Visit Plan  Modify exercises as needed for breathing. Add postural strengthening as able to patient's HEP next session. Consider adding pallof press as able in upcoming sessions. Continue core strengthening, functional lower extremity strengtheing, and focus on flexion based activities. Progress to balance activities as able. Postural strengthening exercises as well as stabilization exercises to improve patient's  ability to maintain good posture with upper extremity movements.     PT Home Exercise Plan  Evaluation: single knee to chest 3 x 30 seconds each LE 1-2x/day 1/28: SKTC, bridge and bent knee raise    Consulted and Agree with Plan of Care  Patient       Patient will benefit from skilled therapeutic intervention in order to improve the following deficits and impairments:  Decreased balance, Decreased endurance, Decreased mobility, Difficulty walking, Decreased range of motion, Improper body mechanics, Obesity, Decreased activity tolerance, Decreased strength, Increased fascial restricitons, Postural dysfunction, Pain  Visit Diagnosis: Chronic bilateral low back pain without sciatica  Muscle weakness (generalized)  Other symptoms and signs involving the musculoskeletal system     Problem List Patient Active Problem List   Diagnosis Date Noted  . Sleep apnea 07/11/2017  . Pancreatic pseudocyst 05/10/2017  . Adrenal adenoma 05/10/2017  . Osteoporosis 11/21/2016  . Fibromyalgia 08/10/2016  . NASH (nonalcoholic steatohepatitis) 04/24/2015  . Orthostatic hypotension 09/19/2013  . Elevated transaminase level 09/19/2013  . HTN (hypertension), benign 09/03/2013  . Prediabetes 08/14/2013  . Depression 08/14/2013  . Fatty liver 08/14/2013  . Hypothyroid 10/23/2012  . Osteopenia 07/08/2012  . Morbid obesity ([place]) 07/08/2012  . Family history of rectal cancer 10/04/2011  . GERD (gastroesophageal reflux disease) 10/04/2011  . IBS (irritable bowel syndrome)  10/04/2011   Clarene Critchley PT, DPT 3:32 PM, 05/25/18 [place], [place], [address] Phone: [phone]   Fax:  [phone]  Name: JAMIELYNN WIGLEY MRN: [id number] Date of Birth: 1945-11-19

## 2018-05-30 ENCOUNTER — Ambulatory Visit (HOSPITAL_COMMUNITY): Payer: PPO

## 2018-05-30 ENCOUNTER — Encounter: Payer: Self-pay | Admitting: Family Medicine

## 2018-05-30 ENCOUNTER — Telehealth (HOSPITAL_COMMUNITY): Payer: Self-pay | Admitting: Family Medicine

## 2018-05-30 NOTE — Telephone Encounter (Signed)
05/30/18  pt called to cx said she had the "doodies" again

## 2018-06-01 ENCOUNTER — Telehealth (HOSPITAL_COMMUNITY): Payer: Self-pay | Admitting: Family Medicine

## 2018-06-01 ENCOUNTER — Ambulatory Visit (HOSPITAL_COMMUNITY): Payer: PPO | Admitting: Physical Therapy

## 2018-06-01 NOTE — Telephone Encounter (Signed)
06/01/18  pt left a message to cx said that she wasn't steady on her feet today

## 2018-06-06 ENCOUNTER — Encounter (HOSPITAL_COMMUNITY): Payer: Self-pay | Admitting: Physical Therapy

## 2018-06-06 ENCOUNTER — Ambulatory Visit (HOSPITAL_COMMUNITY): Payer: PPO | Attending: Family Medicine | Admitting: Physical Therapy

## 2018-06-06 DIAGNOSIS — M545 Low back pain, unspecified: Secondary | ICD-10-CM

## 2018-06-06 DIAGNOSIS — M6281 Muscle weakness (generalized): Secondary | ICD-10-CM | POA: Diagnosis not present

## 2018-06-06 DIAGNOSIS — G8929 Other chronic pain: Secondary | ICD-10-CM

## 2018-06-06 DIAGNOSIS — R29898 Other symptoms and signs involving the musculoskeletal system: Secondary | ICD-10-CM

## 2018-06-06 NOTE — Patient Instructions (Signed)
Wall Slide with lift-off    Elbows bent, little fingers on wall, thumbs out. Slide up. Keep head and pelvis aligned. Squeeze at your shoulder blades to lift off of the wall.  Do _15__ times, _1__ times per day.   Copyright  [employer]. All rights reserved.

## 2018-06-06 NOTE — Therapy (Signed)
[place], [place], [address] Phone: [phone]   Fax:  [phone]  Physical Therapy Treatment  Patient Details  Name: Kristen Mack MRN: [id number] Date of Birth: 26-Apr-1945 Referring Provider (PT): Sallee Lange, MD   Encounter Date: 06/06/2018  PT End of Session - 06/06/18 1350    Visit Number  6    Number of Visits  13    Date for PT Re-Evaluation  06/20/18    Authorization Type  [employer]    Authorization Time Period  04/12/18 - 05/24/18    Authorization - Visit Number  2    Authorization - Number of Visits  10    PT Start Time  5631    PT Stop Time  1424    PT Time Calculation (min)  39 min    Activity Tolerance  Patient tolerated treatment well;No increased pain    Behavior During Therapy  WFL for tasks assessed/performed       Past Medical History:  Diagnosis Date  . Arthritis   . Asthma   . CFS (chronic fatigue syndrome)   . Cracked tooth    X2  RIGHT LOWER MOLAR AND LEFT UPPER MOLAR  . Fatty liver disease, nonalcoholic   . Fibromyalgia   . GERD (gastroesophageal reflux disease)   . History of adenomatous polyp of colon   . History of cardiac murmur    until age 67  . History of TIA (transient ischemic attack)    per CT scan   . Hypertension   . Hypothyroidism   . IBS (irritable bowel syndrome)   . Meniere disease   . OSA (obstructive sleep apnea)    CPAP intolerate -- per study 2010  moderate osa--  STATES HAS RAISED HOB  . Osteopenia   . PMB (postmenopausal bleeding)   . Prediabetes   . Sleep apnea 07/11/2017   Abnormal home sleep apnea test February 2019-CPAP ordered  . Tooth loose    LOWER FRONT    Past Surgical History:  Procedure Laterality Date  . [place]  . CHOLECYSTECTOMY  1995   and  REPAIR UMBILICAL HERNIA  . COLONOSCOPY N/A 04/19/2013   Procedure: COLONOSCOPY;  Surgeon: Rogene Houston, MD;  [facility];  Service:  Endoscopy;  Laterality: N/A;    . ESOPHAGOGASTRODUODENOSCOPY  02-08-2003  . HYSTEROSCOPY W/D&C  11-14-2002   endometrial polypectomy  . HYSTEROSCOPY W/D&C N/A 04/05/2014   Procedure: DILATATION AND CURETTAGE /HYSTEROSCOPY, ENDOMETRIAL POLYPECTOMY;  Surgeon: Sanjuana Kava, MD;  Location: [facility];  Service: Gynecology;  Laterality: N/A;  . LIVER BIOPSY  01-29-2004   benign    There were no vitals filed for this visit.  Subjective Assessment - 06/06/18 1348    Subjective  Patient reported that her back is bothering her today and she rates it as a 5/10.     Pertinent History  HTN, history of TIAs    How long can you sit comfortably?  Not limited    How long can you stand comfortably?  5-10 minutes    How long can you walk comfortably?  3 minutes    Patient Stated Goals  Figuring out a way to cook supper without her back hurting    Currently in Pain?  Yes    Pain Score  5     Pain Location  Back    Pain Orientation  Upper    Pain  Descriptors / Indicators  Aching    Pain Type  Chronic pain                       [facility] Adult PT Treatment/Exercise - 06/06/18 0001      Lumbar Exercises: Stretches   Single Knee to Chest Stretch  Right;Left;3 reps;30 seconds      Lumbar Exercises: Standing   Forward Lunge  10 reps    Forward Lunge Limitations  Each Lower extremity with bilateral UE support    Other Standing Lumbar Exercises  Hip abduction/extension with RTB x10 with UE support. SLS with vectors intermittent UE assist forward, side and back 5x 3'' holds each LE. Postural strengthening: rows, shoulder extension, scapular retraction with RTB x 10 each exercise    Other Standing Lumbar Exercises  Y Liftoff x15 with frequent verbal cues for improved form      Lumbar Exercises: Seated   Sit to Stand  5 reps   2x5 without UEs   Other Seated Lumbar Exercises  Thoracic excursions flexion/extension, sidebend left/right, rotation left/right x 5 each     Other Seated  Lumbar Exercises  Seated abdominal set with marching x 20 3'' holds. Posterior shoulder rolls x 10 with breathing incorporated               PT Short Term Goals - 05/23/18 1413      PT SHORT TERM GOAL #1   Title  Patient will report understanding and regular compliance of HEP to improve patient's strength, mobility, and decrease patient's pain.     Time  2    Period  Weeks    Status  On-going    Target Date  06/06/18      PT SHORT TERM GOAL #2   Title  Patient will report that her back pain has not exceeded a maximum of 6/10 over a 1 week period indicating improved tolerance to daily activities.     Baseline  05/23/18: Patient reported a maximum of 8/10.     Time  2    Period  Weeks    Status  On-going    Target Date  06/06/18      PT SHORT TERM GOAL #3   Title  Patient will demonstrate improvement of at least 1/2 MMT strength grade in all muscle groups tested as deficient in order to improve patient's standing mechanics and stability.     Baseline  05/23/18: Patient achieved some, but not all muscle groups    Time  2    Period  Weeks    Status  Partially Met    Target Date  06/06/18        PT Long Term Goals - 05/23/18 1414      PT LONG TERM GOAL #1   Title  Patient will demonstrate improvement of at least 1 MMT strength grade in all muscle groups tested as deficient in order to improve patient's standing mechanics and stability.     Baseline  See MMT    Time  4    Period  Weeks    Status  On-going    Target Date  06/20/18      PT LONG TERM GOAL #2   Title  Patient will report ability to stand for at least 30 minutes without her back pain increasing above a 2/10 in order to prepare a meal at home with increased independence.     Baseline  05/23/18: Patient reported ability to stand  for no more than 10 minutes    Time  4    Period  Weeks    Status  On-going    Target Date  06/20/18      PT LONG TERM GOAL #3   Title  Patient will demonstrate ability to maintain  single limb stance for at least 5 seconds on each lower extremity indicating improved balance, and improved safety in order to navigate stairs at home.     Baseline  05/23/18: See objective measures    Time  4    Period  Weeks    Status  On-going    Target Date  06/20/18            Plan - 06/06/18 1410    Clinical Impression Statement  Continued with established plan of care this session. Added thoracic excursions this session for improved thoracic mobility and to decrease the patient's upper back pain. Patient required use of her inhaler during therapy session, but tolerated all exercises well.     Rehab Potential  Good    Clinical Impairments Affecting Rehab Potential  Positive: motivated; Negative: chronicity of problem    PT Frequency  2x / week    PT Duration  4 weeks    PT Treatment/Interventions  ADLs/Self Care Home Management;Aquatic Therapy;Electrical Stimulation;Moist Heat;DME Instruction;Gait training;Stair training;Functional mobility training;Therapeutic activities;Therapeutic exercise;Balance training;Neuromuscular re-education;Patient/family education;Orthotic Fit/Training;Manual techniques;Passive range of motion;Dry needling;Taping    PT Next Visit Plan  Modify exercises as needed for breathing. Add postural strengthening as able to patient's HEP next session. Consider adding pallof press as able in upcoming sessions. Continue core strengthening, functional lower extremity strengtheing, and focus on flexion based activities. Progress to balance activities as able. Postural strengthening exercises as well as stabilization exercises to improve patient's ability to maintain good posture with upper extremity movements.     PT Home Exercise Plan  Evaluation: single knee to chest 3 x 30 seconds each LE 1-2x/day 1/28: SKTC, bridge and bent knee raise; 06/06/18: Y lift-off 15x/1x/day    Consulted and Agree with Plan of Care  Patient       Patient will benefit from skilled  therapeutic intervention in order to improve the following deficits and impairments:  Decreased balance, Decreased endurance, Decreased mobility, Difficulty walking, Decreased range of motion, Improper body mechanics, Obesity, Decreased activity tolerance, Decreased strength, Increased fascial restricitons, Postural dysfunction, Pain  Visit Diagnosis: Chronic bilateral low back pain without sciatica  Muscle weakness (generalized)  Other symptoms and signs involving the musculoskeletal system     Problem List Patient Active Problem List   Diagnosis Date Noted  . Sleep apnea 07/11/2017  . Pancreatic pseudocyst 05/10/2017  . Adrenal adenoma 05/10/2017  . Osteoporosis 11/21/2016  . Fibromyalgia 08/10/2016  . NASH (nonalcoholic steatohepatitis) 04/24/2015  . Orthostatic hypotension 09/19/2013  . Elevated transaminase level 09/19/2013  . HTN (hypertension), benign 09/03/2013  . Prediabetes 08/14/2013  . Depression 08/14/2013  . Fatty liver 08/14/2013  . Hypothyroid 10/23/2012  . Osteopenia 07/08/2012  . Morbid obesity ([place]) 07/08/2012  . Family history of rectal cancer 10/04/2011  . GERD (gastroesophageal reflux disease) 10/04/2011  . IBS (irritable bowel syndrome) 10/04/2011   Clarene Critchley PT, DPT 2:30 PM, 06/06/18 [place], [place], [address] Phone: [phone]   Fax:  [phone]  Name: Kristen Mack MRN: [id number] Date of Birth: 01-01-1946

## 2018-06-07 ENCOUNTER — Encounter: Payer: Self-pay | Admitting: Family Medicine

## 2018-06-07 ENCOUNTER — Other Ambulatory Visit: Payer: Self-pay

## 2018-06-07 MED ORDER — BUPROPION HCL ER (SR) 150 MG PO TB12: 150.0000 mg | ORAL_TABLET | Freq: Two times a day (BID) | ORAL | 5 refills | Status: DC

## 2018-06-07 NOTE — Telephone Encounter (Signed)
Please send 6 months a refill regarding her medication  Please let the patient know that the likelihood of physical therapy having coronavirus at this time is low but not totally no risk  Patient could be considered to go to physical therapy 1 or 2 more times and alert with a physical therapist that she would like to be shown as many home exercises that she could be do on her own as possible-and that way the patient with the help of her daughters can do some home physical therapy.

## 2018-06-08 ENCOUNTER — Ambulatory Visit (HOSPITAL_COMMUNITY): Payer: PPO

## 2018-06-08 ENCOUNTER — Telehealth (HOSPITAL_COMMUNITY): Payer: Self-pay | Admitting: Family Medicine

## 2018-06-08 NOTE — Telephone Encounter (Signed)
06/08/18  pt cx said she thinks she is having a reaction to her medication and must stay near a bathroom

## 2018-06-13 ENCOUNTER — Telehealth (HOSPITAL_COMMUNITY): Payer: Self-pay | Admitting: Family Medicine

## 2018-06-13 ENCOUNTER — Encounter: Payer: Self-pay | Admitting: Family Medicine

## 2018-06-13 ENCOUNTER — Ambulatory Visit (HOSPITAL_COMMUNITY): Payer: PPO

## 2018-06-13 NOTE — Telephone Encounter (Signed)
06/13/18  pt left a message to cx due to quarantine for people over 60 - she still hasn't gotten over the flu

## 2018-06-13 NOTE — Telephone Encounter (Signed)
06/13/18  on hold for now due to COVID-19

## 2018-06-13 NOTE — Telephone Encounter (Signed)
Please cancel appointment Patient may need geared toward following up in 2 to 3 months when this settles down Send her my chart message thank you

## 2018-06-15 ENCOUNTER — Ambulatory Visit (HOSPITAL_COMMUNITY): Payer: PPO | Admitting: Physical Therapy

## 2018-06-18 DIAGNOSIS — G4733 Obstructive sleep apnea (adult) (pediatric): Secondary | ICD-10-CM | POA: Diagnosis not present

## 2018-06-20 ENCOUNTER — Ambulatory Visit (HOSPITAL_COMMUNITY): Payer: PPO | Admitting: Physical Therapy

## 2018-06-20 ENCOUNTER — Ambulatory Visit: Payer: PPO | Admitting: Family Medicine

## 2018-06-22 ENCOUNTER — Encounter (HOSPITAL_COMMUNITY): Payer: PPO | Admitting: Physical Therapy

## 2018-07-04 ENCOUNTER — Encounter: Payer: Self-pay | Admitting: Family Medicine

## 2018-07-19 DIAGNOSIS — G4733 Obstructive sleep apnea (adult) (pediatric): Secondary | ICD-10-CM | POA: Diagnosis not present

## 2018-07-21 ENCOUNTER — Other Ambulatory Visit: Payer: Self-pay

## 2018-07-21 MED ORDER — GLUCOSE BLOOD VI STRP: ORAL_STRIP | 1 refills | Status: DC

## 2018-07-23 ENCOUNTER — Other Ambulatory Visit: Payer: Self-pay | Admitting: Family Medicine

## 2018-07-25 ENCOUNTER — Other Ambulatory Visit: Payer: Self-pay

## 2018-07-25 ENCOUNTER — Telehealth (HOSPITAL_COMMUNITY): Payer: Self-pay | Admitting: Physical Therapy

## 2018-07-25 MED ORDER — FLUTICASONE PROPIONATE 50 MCG/ACT NA SUSP: NASAL | 3 refills | Status: DC

## 2018-07-25 NOTE — Telephone Encounter (Signed)
Therapist called to check-in on patient and see if patient had any questions or would like any updates to her HEP. There was no answer and no voicemail box to leave a message. Plan to try again later.   Clarene Critchley PT, DPT 4:32 PM, 07/25/18 [phone]

## 2018-08-08 ENCOUNTER — Telehealth (HOSPITAL_COMMUNITY): Payer: Self-pay

## 2018-08-08 NOTE — Telephone Encounter (Signed)
I attempted to call Ms. Osoria at her home number and there was no answer and no message could be left. I also attempted to call Ms. Mchatton daughter, Amy, as she is listed as a contact who is ok to discuss pt's care with. I did not leave a voice message on her daughters phone. I will call back at a later date/time to inform patient our office is re-opening.   Kipp Brood, PT, DPT, [facility] Physical Therapist with [facility]  08/08/2018 3:13 PM

## 2018-09-03 ENCOUNTER — Encounter: Payer: Self-pay | Admitting: Family Medicine

## 2018-09-06 ENCOUNTER — Other Ambulatory Visit: Payer: Self-pay | Admitting: Family Medicine

## 2018-09-06 ENCOUNTER — Other Ambulatory Visit: Payer: Self-pay

## 2018-09-06 ENCOUNTER — Ambulatory Visit (INDEPENDENT_AMBULATORY_CARE_PROVIDER_SITE_OTHER): Payer: PPO | Admitting: Nurse Practitioner

## 2018-09-06 ENCOUNTER — Encounter: Payer: Self-pay | Admitting: Nurse Practitioner

## 2018-09-06 VITALS — Temp 97.4°F | Wt 212.4 lb

## 2018-09-06 DIAGNOSIS — H6982 Other specified disorders of Eustachian tube, left ear: Secondary | ICD-10-CM

## 2018-09-06 MED ORDER — PREDNISONE 20 MG PO TABS: ORAL_TABLET | ORAL | 0 refills | Status: DC

## 2018-09-06 NOTE — Progress Notes (Signed)
   Subjective:    Patient ID: Kristen Mack, female    DOB: [birth year], 73 y.o.   MRN: [id number]  HPI Pt here today for left ear fullness. Pt states that she is unable to ear out of left hear. Pt also states that she has a heavy feeling at times. Her right ear is now feeling the same way. Pt states she has been told by ENT that she has a malfunction eustachian tube. Pt has light headness and nausea with ear fullness also. Sore throat and cough at times.  Pt states that she had flu back in December and every since she had flu she has not been able to keep her CBG under control.  Presents for complaints of fullness in her left ear is been occurring off and on since February when she had a viral illness.  Decreased hearing.  No headache or fever.  Very mild dizziness at times.  No syncope or other neurologic symptoms.  According to patient she has been seen by the ENT specialist locally, was told that she has problems with her left eustachian tube.  Also states that she has been diagnosed with Mnire's in the past.  Currently takes HCTZ to help this.    Review of Systems     Objective:   Physical Exam NAD.  Alert, oriented.  Right TM minimally retracted, no erythema.  Left TM significant retraction, no erythema.  Pharynx clear moist.  Neck supple with mild soft anterior adenopathy.  Lungs clear.  Heart regular rate rhythm.  Is able to get on and off the exam table with no balance issues noted.       Assessment & Plan:     ICD-10-CM   1. Dysfunction of left eustachian tube  H69.82    Meds ordered this encounter  Medications  . predniSONE (DELTASONE) 20 MG tablet    Sig: 2 qd for 5d    Dispense:  10 tablet    Refill:  0    Order Specific Question:   Supervising Provider    Answer:   Kathyrn Drown [phone]   Patient has taken prednisone without difficulty in the past.  Patient understands that her blood sugars will temporarily be elevated while on prednisone.  Continue Flonase,  cetirizine, meclizine and HCTZ as directed.  Warning signs reviewed.  Call back early next week if no improvement, sooner if worse.  Patient is here today for an acute visit.  Strongly encourage patient to get blood work ordered back in February and follow-up in the near future for routine visit on her chronic health issues.

## 2018-09-07 ENCOUNTER — Telehealth: Payer: Self-pay | Admitting: Family Medicine

## 2018-09-07 ENCOUNTER — Other Ambulatory Visit: Payer: Self-pay | Admitting: Nurse Practitioner

## 2018-09-07 ENCOUNTER — Encounter: Payer: Self-pay | Admitting: Nurse Practitioner

## 2018-09-07 MED ORDER — POTASSIUM CHLORIDE CRYS ER 20 MEQ PO TBCR: 20.0000 meq | EXTENDED_RELEASE_TABLET | Freq: Every day | ORAL | 3 refills | Status: DC

## 2018-09-07 NOTE — Telephone Encounter (Signed)
Done

## 2018-09-07 NOTE — Telephone Encounter (Signed)
Pt is needing potassium called in to [facility].

## 2018-09-07 NOTE — Telephone Encounter (Signed)
Please advise. Thank you

## 2018-09-22 ENCOUNTER — Encounter: Payer: Self-pay | Admitting: Family Medicine

## 2018-09-22 ENCOUNTER — Other Ambulatory Visit: Payer: Self-pay | Admitting: *Deleted

## 2018-09-22 MED ORDER — ONETOUCH VERIO VI STRP: ORAL_STRIP | 1 refills | Status: DC

## 2018-09-27 ENCOUNTER — Encounter: Payer: Self-pay | Admitting: Family Medicine

## 2018-09-27 MED ORDER — SHINGRIX 50 MCG/0.5ML IM SUSR: 0.5000 mL | Freq: Once | INTRAMUSCULAR | 1 refills | Status: AC

## 2018-09-27 NOTE — Addendum Note (Signed)
Addended by: Vicente Males on: 09/27/2018 03:06 PM   Modules accepted: Orders

## 2018-09-27 NOTE — Telephone Encounter (Signed)
Nurses please give a prescription for shin Grix  It is okay to do nasal decongestant for maximum of 3 days long-term use not recommended otherwise stick with all the current medicines and see ENT as planned later in July

## 2018-10-02 DIAGNOSIS — H35713 Central serous chorioretinopathy, bilateral: Secondary | ICD-10-CM | POA: Diagnosis not present

## 2018-10-08 ENCOUNTER — Other Ambulatory Visit: Payer: Self-pay | Admitting: Family Medicine

## 2018-10-12 ENCOUNTER — Telehealth: Payer: Self-pay | Admitting: "Endocrinology

## 2018-10-12 ENCOUNTER — Telehealth: Payer: Self-pay

## 2018-10-12 ENCOUNTER — Encounter: Payer: Self-pay | Admitting: Family Medicine

## 2018-10-12 DIAGNOSIS — E038 Other specified hypothyroidism: Secondary | ICD-10-CM

## 2018-10-12 DIAGNOSIS — R748 Abnormal levels of other serum enzymes: Secondary | ICD-10-CM

## 2018-10-12 DIAGNOSIS — R7303 Prediabetes: Secondary | ICD-10-CM

## 2018-10-12 DIAGNOSIS — E785 Hyperlipidemia, unspecified: Secondary | ICD-10-CM

## 2018-10-12 NOTE — Telephone Encounter (Signed)
Patient has labs that were ordered by Dr. Williemae Natter office She should do those Only additional labs I would recommend would be a liver profile and lipid profile  The patient should be aware that when she goes to the lab they will only draw what she requests Meaning there is a possibility they may draw Dr.Nidas but not draw ours  she will need both  Nurses make sure our new orders are at the same place as Dr. Dorris Fetch labs  Somewhat confusing

## 2018-10-12 NOTE — Telephone Encounter (Signed)
Done

## 2018-10-12 NOTE — Telephone Encounter (Signed)
Patient needs an updated lab order for [facility] [address].

## 2018-10-12 NOTE — Telephone Encounter (Signed)
Kristen Mack, CMA  

## 2018-10-13 NOTE — Addendum Note (Signed)
Addended by: Dairl Ponder on: 10/13/2018 08:49 AM   Modules accepted: Orders

## 2018-10-17 DIAGNOSIS — E785 Hyperlipidemia, unspecified: Secondary | ICD-10-CM | POA: Diagnosis not present

## 2018-10-17 DIAGNOSIS — R748 Abnormal levels of other serum enzymes: Secondary | ICD-10-CM | POA: Diagnosis not present

## 2018-10-17 DIAGNOSIS — R7303 Prediabetes: Secondary | ICD-10-CM | POA: Diagnosis not present

## 2018-10-17 DIAGNOSIS — E038 Other specified hypothyroidism: Secondary | ICD-10-CM | POA: Diagnosis not present

## 2018-10-18 LAB — LIPID PANEL
Chol/HDL Ratio: 4.2 ratio (ref 0.0–4.4)
Cholesterol, Total: 174 mg/dL (ref 100–199)
HDL: 41 mg/dL (ref >39)
LDL Calculated: 87 mg/dL (ref 0–99)
Triglycerides: 231 mg/dL — ABNORMAL HIGH (ref 0–149)
VLDL Cholesterol Cal: 46 mg/dL — ABNORMAL HIGH (ref 5–40)

## 2018-10-18 LAB — HEPATIC FUNCTION PANEL
ALT: 41 IU/L — ABNORMAL HIGH (ref 0–32)
AST: 41 IU/L — ABNORMAL HIGH (ref 0–40)
Albumin: 4.2 g/dL (ref 3.7–4.7)
Alkaline Phosphatase: 98 IU/L (ref 39–117)
Bilirubin Total: 0.4 mg/dL (ref 0.0–1.2)
Bilirubin, Direct: 0.14 mg/dL (ref 0.00–0.40)
Total Protein: 7.7 g/dL (ref 6.0–8.5)

## 2018-10-24 LAB — HEMOGLOBIN A1C
Est. average glucose Bld gHb Est-mCnc: 169 mg/dL
Hgb A1c MFr Bld: 7.5 % — ABNORMAL HIGH (ref 4.8–5.6)

## 2018-10-24 LAB — T4, FREE: Free T4: 1.7 ng/dL (ref 0.82–1.77)

## 2018-10-24 LAB — TSH: TSH: 0.791 u[IU]/mL (ref 0.450–4.500)

## 2018-10-24 LAB — METANEPHRINES, PLASMA
Metanephrine, Free: <10 pg/mL (ref 0.0–88.0)
Normetanephrine, Free: 15.6 pg/mL (ref 0.0–191.8)

## 2018-10-25 ENCOUNTER — Encounter: Payer: Self-pay | Admitting: Family Medicine

## 2018-10-26 ENCOUNTER — Ambulatory Visit (INDEPENDENT_AMBULATORY_CARE_PROVIDER_SITE_OTHER): Payer: PPO | Admitting: Otolaryngology

## 2018-10-26 DIAGNOSIS — R42 Dizziness and giddiness: Secondary | ICD-10-CM

## 2018-10-26 DIAGNOSIS — H6522 Chronic serous otitis media, left ear: Secondary | ICD-10-CM | POA: Diagnosis not present

## 2018-10-26 DIAGNOSIS — H9012 Conductive hearing loss, unilateral, left ear, with unrestricted hearing on the contralateral side: Secondary | ICD-10-CM | POA: Diagnosis not present

## 2018-10-26 DIAGNOSIS — H6121 Impacted cerumen, right ear: Secondary | ICD-10-CM | POA: Diagnosis not present

## 2018-10-26 DIAGNOSIS — H6982 Other specified disorders of Eustachian tube, left ear: Secondary | ICD-10-CM

## 2018-10-27 ENCOUNTER — Other Ambulatory Visit: Payer: Self-pay

## 2018-10-31 ENCOUNTER — Encounter: Payer: Self-pay | Admitting: "Endocrinology

## 2018-10-31 ENCOUNTER — Other Ambulatory Visit: Payer: Self-pay

## 2018-10-31 ENCOUNTER — Ambulatory Visit (INDEPENDENT_AMBULATORY_CARE_PROVIDER_SITE_OTHER): Payer: PPO | Admitting: "Endocrinology

## 2018-10-31 DIAGNOSIS — E038 Other specified hypothyroidism: Secondary | ICD-10-CM

## 2018-10-31 DIAGNOSIS — E119 Type 2 diabetes mellitus without complications: Secondary | ICD-10-CM

## 2018-10-31 DIAGNOSIS — D3502 Benign neoplasm of left adrenal gland: Secondary | ICD-10-CM

## 2018-10-31 NOTE — Progress Notes (Signed)
10/31/2018, 4:53 PM                                Endocrinology Telehealth Visit Follow up Note -During COVID -19 Pandemic  I connected with Kristen Mack on 10/31/2018   by telephone and verified that I am speaking with the correct person using two identifiers. Kristen Mack, [birth year]. she has verbally consented to this visit. All issues noted in this document were discussed and addressed. The format was not optimal for physical exam.   Subjective:    Patient ID: Kristen Mack, female    DOB: 27-Jan-1946, PCP Kathyrn Drown, MD   Past Medical History:  Diagnosis Date  . Arthritis   . Asthma   . CFS (chronic fatigue syndrome)   . Cracked tooth    X2  RIGHT LOWER MOLAR AND LEFT UPPER MOLAR  . Fatty liver disease, nonalcoholic   . Fibromyalgia   . GERD (gastroesophageal reflux disease)   . History of adenomatous polyp of colon   . History of cardiac murmur    until age 83  . History of TIA (transient ischemic attack)    per CT scan   . Hypertension   . Hypothyroidism   . IBS (irritable bowel syndrome)   . Meniere disease   . OSA (obstructive sleep apnea)    CPAP intolerate -- per study 2010  moderate osa--  STATES HAS RAISED HOB  . Osteopenia   . PMB (postmenopausal bleeding)   . Prediabetes   . Sleep apnea 07/11/2017   Abnormal home sleep apnea test February 2019-CPAP ordered  . Tooth loose    LOWER FRONT   Past Surgical History:  Procedure Laterality Date  . [place]  . CHOLECYSTECTOMY  1995   and  REPAIR UMBILICAL HERNIA  . COLONOSCOPY N/A 04/19/2013   Procedure: COLONOSCOPY;  Surgeon: Rogene Houston, MD;  Location: [facility];  Service: Endoscopy;  Laterality: N/A;    . ESOPHAGOGASTRODUODENOSCOPY  02-08-2003  . HYSTEROSCOPY W/D&C  11-14-2002   endometrial polypectomy  . HYSTEROSCOPY W/D&C N/A 04/05/2014   Procedure: DILATATION AND CURETTAGE /HYSTEROSCOPY, ENDOMETRIAL  POLYPECTOMY;  Surgeon: Sanjuana Kava, MD;  Location: [facility];  Service: Gynecology;  Laterality: N/A;  . LIVER BIOPSY  01-29-2004   benign   Social History   Socioeconomic History  . Marital status: Married    Spouse name: Not on file  . Number of children: 2  . Years of education: Not on file  . Highest education level: Not on file  Occupational History  . Occupation: retired, Animal nutritionist  . Financial resource strain: Not on file  . Food insecurity    Worry: Not on file    Inability: Not on file  . Transportation needs    Medical: Not on file    Non-medical: Not on file  Tobacco Use  . Smoking status: Former Smoker    Packs/day: 0.50    Years: 3.00    Pack years: 1.50    Types: Cigarettes    Quit date: 10/03/1968    Years since quitting: 50.1  .  Smokeless tobacco: Never Used  Substance and Sexual Activity  . Alcohol use: No    Alcohol/week: 0.0 standard drinks  . Drug use: No  . Sexual activity: Not on file  Lifestyle  . Physical activity    Days per week: Not on file    Minutes per session: Not on file  . Stress: Not on file  Relationships  . Social Herbalist on phone: Not on file    Gets together: Not on file    Attends religious service: Not on file    Active member of club or organization: Not on file    Attends meetings of clubs or organizations: Not on file    Relationship status: Not on file  Other Topics Concern  . Not on file  Social History Narrative  . Not on file   Outpatient Encounter Medications as of 10/31/2018  Medication Sig  . albuterol (PROVENTIL HFA;VENTOLIN HFA) 108 (90 Base) MCG/ACT inhaler INHALE 2 PUFFS BY MOUTH EVERY 6 HOURS AS NEEDED FOR SHORTNESS OF BREATH  . AMBULATORY NON FORMULARY MEDICATION Medication Name: Liver Essentials- Take 2 capsules by mouth 2-3 times daily  . amLODipine (NORVASC) 5 MG tablet Take 1 tablet by mouth once daily  . azithromycin (ZITHROMAX Z-PAK) 250 MG tablet Take 2 tablets  (500 mg) on  Day 1,  followed by 1 tablet (250 mg) once daily on Days 2 through 5.  . B Complex Vitamins (B COMPLEX 100 PO) Take 1 tablet by mouth daily.  Jolyne Loa Grape-Goldenseal (BERBERINE COMPLEX PO) Take by mouth. 59m. Takes 3 tabs 2 -3 times a day  . buPROPion (WELLBUTRIN SR) 150 MG 12 hr tablet Take 1 tablet (150 mg total) by mouth 2 (two) times daily.  . Calcium Carb-Cholecalciferol (CALCIUM 500 + D3 PO) Take by mouth.  . cetirizine (ZYRTEC) 10 MG tablet Take 10 mg by mouth daily as needed.   . Chromium 1000 MCG TABS Take 1 tablet by mouth daily.   .Marland KitchenCINNAMON PO Take by mouth. gymnea  . Coenzyme Q10 (COQ10) 200 MG CAPS Take 1 capsule by mouth daily.  . Cyanocobalamin (B-12) 1000 MCG SUBL Place 1 tablet under the tongue daily.   .Marland KitchenDIGESTIVE ENZYMES PO Take 1 tablet by mouth 3 (three) times daily.   .Marland Kitchendoxycycline (VIBRAMYCIN) 100 MG capsule Take 1 capsule (100 mg total) by mouth 2 (two) times daily.  . fluticasone (FLONASE) 50 MCG/ACT nasal spray USE 2 SPRAY(S) IN EACH NOSTRIL ONCE DAILY FOR CONGESTION  . glucose blood (ONETOUCH VERIO) test strip Test once daily  . hydrochlorothiazide (MICROZIDE) 12.5 MG capsule One po Q am  . ibuprofen (ADVIL,MOTRIN) 200 MG tablet Take 200-400 mg by mouth every 6 (six) hours as needed. For pain  . ipratropium (ATROVENT) 0.02 % nebulizer solution USE ONE VIAL IN NEBULIZER 4 TIMES DAILY AS NEEDED FOR SHORTNESS OF BREATH  . levothyroxine (SYNTHROID, LEVOTHROID) 150 MCG tablet TAKE 1 TABLET BY MOUTH ONCE DAILY  . losartan (COZAAR) 100 MG tablet Take 1 tablet by mouth once daily  . Lysine HCl 500 MG TABS Take 1 tablet by mouth daily. Take as needed  . Magnesium 250 MG TABS Take by mouth 2 (two) times daily.  . meclizine (ANTIVERT) 50 MG tablet Take 50 mg by mouth daily as needed for dizziness. Take as needed  . Melatonin 10 MG CAPS Take by mouth at bedtime.  . montelukast (SINGULAIR) 10 MG tablet TAKE 1 TABLET BY MOUTH AT BEDTIME  .  OVER THE  COUNTER MEDICATION Tumeric plus beperrine. Once daily.  Marland Kitchen OVER THE COUNTER MEDICATION Fennel seeds. One tsp with meals  . OVER THE COUNTER MEDICATION gymnia plus berberine one daily and  gymnia plus cinnamon one daily  . potassium chloride SA (KLOR-CON M20) 20 MEQ tablet Take 1 tablet (20 mEq total) by mouth daily.  . predniSONE (DELTASONE) 20 MG tablet 2 qd for 5d  . VITAMIN A PO Take 10,000 mcg by mouth daily.   . vitamin C (ASCORBIC ACID) 500 MG tablet Take 6,000 mg by mouth daily.   . vitamin E 100 UNIT capsule Take 800 Units by mouth daily.    No facility-administered encounter medications on file as of 10/31/2018.    ALLERGIES: Allergies  Allergen Reactions  . Ambien [Zolpidem Tartrate] Other (See Comments)    Side effect "last a couple days"  . Cefzil [Cefprozil]     diarrhea  . Codeine Itching and Nausea And Vomiting  . Demerol [Meperidine] Other (See Comments)    "messes up senses"  . Formaldehyde   . Latex Other (See Comments)    "skin gets raw"  . Sulfa Antibiotics Other (See Comments)    Severe abd cramp  . Celexa [Citalopram Hydrobromide] Other (See Comments)    Mouth sores    VACCINATION STATUS: Immunization History  Administered Date(s) Administered  . Influenza,inj,Quad PF,6+ Mos 03/02/2016, 03/03/2017  . Influenza-Unspecified 04/15/2018  . Pneumococcal Conjugate-13 05/22/2014  . Pneumococcal Polysaccharide-23 03/03/2017    HPI Kristen Mack is 73 y.o. female who was seen in consultation for incidental 1.5 cm left adrenal adenoma which was previously worked up and shown to be nonfunctional.  She is being engaged in telehealth via telephone for follow-up with repeat plasma metanephrines.  She has no new complaints.  However, since her last visit she was diagnosed with type 2 diabetes with A1c of 7.5%.    -Patient denies any prior history of adrenal dysfunction or lesion. -She is known to have pancreatic pseudocyst for some time.  On surveillance MRI of  the abdomen to follow-up with the pancreatic pseudocyst on May 04, 2017 she was found to have 1.5 cm left adrenal adenoma. -Her 24-hour urine function studies are all within normal limits.   -She denies history of difficult to control hypertension, in fact has history of orthostatic hypotension.  She denies spells of palpitations, headaches, sweating. -Denies rapid weight change lately, she has prediabetes on no medications.  She has hypothyroidism induced by Hashimoto's thyroiditis currently on levothyroxine 150 mcg p.o. every morning. -She denies close  family history of pituitary, adrenal dysfunctions.  Review of Systems  Limited as above.  Objective:    There were no vitals taken for this visit.  Wt Readings from Last 3 Encounters:  09/06/18 212 lb 6.4 oz (96.3 kg)  05/04/18 215 lb (97.5 kg)  03/06/18 215 lb (97.5 kg)     Recent Results (from the past 2160 hour(s))  Metanephrines, plasma     Status: None   Collection Time: 10/17/18 11:44 AM  Result Value Ref Range   Normetanephrine, Free 15.6 0.0 - 191.8 pg/mL   Metanephrine, Free <10.0 0.0 - 88.0 pg/mL  TSH     Status: None   Collection Time: 10/17/18 11:44 AM  Result Value Ref Range   TSH 0.791 0.450 - 4.500 uIU/mL  T4, Free     Status: None   Collection Time: 10/17/18 11:44 AM  Result Value Ref Range   Free T4 1.70 0.82 -  1.77 ng/dL  Hemoglobin A1c     Status: Abnormal   Collection Time: 10/17/18 11:44 AM  Result Value Ref Range   Hgb A1c MFr Bld 7.5 (H) 4.8 - 5.6 %    Comment:          Prediabetes: 5.7 - 6.4          Diabetes: >6.4          Glycemic control for adults with diabetes: <7.0    Est. average glucose Bld gHb Est-mCnc 169 mg/dL  Lipid panel     Status: Abnormal   Collection Time: 10/17/18 11:44 AM  Result Value Ref Range   Cholesterol, Total 174 100 - 199 mg/dL   Triglycerides 231 (H) 0 - 149 mg/dL   HDL 41 >39 mg/dL   VLDL Cholesterol Cal 46 (H) 5 - 40 mg/dL   LDL Calculated 87 0 - 99 mg/dL    Chol/HDL Ratio 4.2 0.0 - 4.4 ratio    Comment:                                   T. Chol/HDL Ratio                                             Men  Women                               1/2 Avg.Risk  3.4    3.3                                   Avg.Risk  5.0    4.4                                2X Avg.Risk  9.6    7.1                                3X Avg.Risk 23.4   11.0   Hepatic function panel     Status: Abnormal   Collection Time: 10/17/18 11:44 AM  Result Value Ref Range   Total Protein 7.7 6.0 - 8.5 g/dL   Albumin 4.2 3.7 - 4.7 g/dL   Bilirubin Total 0.4 0.0 - 1.2 mg/dL   Bilirubin, Direct 0.14 0.00 - 0.40 mg/dL   Alkaline Phosphatase 98 39 - 117 IU/L   AST 41 (H) 0 - 40 IU/L   ALT 41 (H) 0 - 32 IU/L    Diabetic Labs (most recent): Lab Results  Component Value Date   HGBA1C 7.5 (H) 10/17/2018   HGBA1C 6.5 (H) 02/18/2018   HGBA1C 6.1 (H) 10/06/2017     Lipid Panel ( most recent) Lipid Panel     Component Value Date/Time   CHOL 174 10/17/2018 1144   TRIG 231 (H) 10/17/2018 1144   HDL 41 10/17/2018 1144   CHOLHDL 4.2 10/17/2018 1144   CHOLHDL 3.3 05/07/2014 1122   VLDL 24 05/07/2014 1122   LDLCALC 87 10/17/2018 1144          MRI of abdomen on May 04, 2017:  Normal right adrenal gland. Left adrenal nodule on the order of 1.5 cm is consistent with an adenoma.  Assessment & Plan:   1. Adenoma of left adrenal gland -Her previsit plasma free metanephrines are within normal limits. -She has previously undergone 24-hour urine studies for catecholamines, metanephrines, cortisol, and aldosterone, which are all within normal limits. -This is consistent with nonfunctioning left adrenal adenoma of 1.5 cm. -She will not require any specific treatment at this time. -She is scheduled to have surveillance MRI of abdomen related to her history of pancreatic pseudocyst, hoping that it will include adrenal glands and would be a chance to see if the adrenal adenoma is  changing.  2.  Type 2 diabetes-A1c was 7.5% -She is currently trying on lifestyle medication.  If A1c remains above 7% in 6 months, she should be approached with low-dose metformin.  - she  admits there is a room for improvement in her diet and drink choices. -  Suggestion is made for her to avoid simple carbohydrates  from her diet including Cakes, Sweet Desserts / Pastries, Ice Cream, Soda (diet and regular), Sweet Tea, Candies, Chips, Cookies, Sweet Pastries,  Store Bought Juices, Alcohol in Excess of  1-2 drinks a day, Artificial Sweeteners, Coffee Creamer, and "Sugar-free" Products. This will help patient to have stable blood glucose profile and potentially avoid unintended weight gain.  3.  Hypothyroidism Her previsit thyroid function tests are consistent with appropriate replacement.   She is advised to continue levothyroxine 150 mcg p.o. daily before breakfast. - I advised patient to maintain close follow up with Kathyrn Drown, MD for primary care needs.  - Patient Care Time Today:  25 min, of which >50% was spent in  counseling and the rest reviewing her  current and  previous labs/studies, previous treatments, her blood glucose readings, and medications' doses and developing a plan for long-term care based on the latest recommendations for standards of care.   Kristen Mack participated in the discussions, expressed understanding, and voiced agreement with the above plans.  All questions were answered to her satisfaction. she is encouraged to contact clinic should she have any questions or concerns prior to her return visit.   Follow up plan: Return in about 1 year (around 10/31/2019) for Follow up with Pre-visit Labs.   Glade Lloyd, MD [facility] [address] [place], [place] [address] Phone: [phone]  Fax: [phone]     10/31/2018, 4:53 PM  This note was partially dictated with voice recognition software.  Similar sounding words can be transcribed inadequately or may not  be corrected upon review.

## 2018-10-31 NOTE — Patient Instructions (Signed)

## 2018-11-01 ENCOUNTER — Other Ambulatory Visit: Payer: Self-pay | Admitting: Family Medicine

## 2018-11-02 NOTE — Telephone Encounter (Signed)
May have 90-day on refills needs virtual visit or in person this fall

## 2018-11-08 DIAGNOSIS — R42 Dizziness and giddiness: Secondary | ICD-10-CM | POA: Diagnosis not present

## 2018-11-08 DIAGNOSIS — H9012 Conductive hearing loss, unilateral, left ear, with unrestricted hearing on the contralateral side: Secondary | ICD-10-CM | POA: Diagnosis not present

## 2018-11-08 DIAGNOSIS — H903 Sensorineural hearing loss, bilateral: Secondary | ICD-10-CM | POA: Diagnosis not present

## 2018-11-08 DIAGNOSIS — H6522 Chronic serous otitis media, left ear: Secondary | ICD-10-CM | POA: Diagnosis not present

## 2018-11-08 DIAGNOSIS — H6982 Other specified disorders of Eustachian tube, left ear: Secondary | ICD-10-CM | POA: Diagnosis not present

## 2018-11-15 DIAGNOSIS — H35713 Central serous chorioretinopathy, bilateral: Secondary | ICD-10-CM | POA: Diagnosis not present

## 2018-11-20 DIAGNOSIS — R42 Dizziness and giddiness: Secondary | ICD-10-CM | POA: Diagnosis not present

## 2018-11-27 DIAGNOSIS — R42 Dizziness and giddiness: Secondary | ICD-10-CM | POA: Diagnosis not present

## 2018-12-05 ENCOUNTER — Other Ambulatory Visit: Payer: Self-pay | Admitting: Family Medicine

## 2018-12-05 NOTE — Telephone Encounter (Signed)
Virtual visit this fall may have 1 refill

## 2018-12-07 ENCOUNTER — Ambulatory Visit (INDEPENDENT_AMBULATORY_CARE_PROVIDER_SITE_OTHER): Payer: PPO | Admitting: Otolaryngology

## 2018-12-07 DIAGNOSIS — H903 Sensorineural hearing loss, bilateral: Secondary | ICD-10-CM

## 2018-12-07 DIAGNOSIS — H7202 Central perforation of tympanic membrane, left ear: Secondary | ICD-10-CM | POA: Diagnosis not present

## 2018-12-07 DIAGNOSIS — H6983 Other specified disorders of Eustachian tube, bilateral: Secondary | ICD-10-CM

## 2018-12-07 NOTE — Telephone Encounter (Signed)
Phone visit scheduled for 12/26/2018 - please order refill

## 2018-12-11 ENCOUNTER — Ambulatory Visit (INDEPENDENT_AMBULATORY_CARE_PROVIDER_SITE_OTHER): Payer: PPO | Admitting: Otolaryngology

## 2018-12-18 ENCOUNTER — Ambulatory Visit (INDEPENDENT_AMBULATORY_CARE_PROVIDER_SITE_OTHER): Payer: PPO | Admitting: Otolaryngology

## 2018-12-18 ENCOUNTER — Other Ambulatory Visit: Payer: Self-pay | Admitting: *Deleted

## 2018-12-18 MED ORDER — ONETOUCH VERIO VI STRP: ORAL_STRIP | 1 refills | Status: DC

## 2018-12-19 ENCOUNTER — Encounter: Payer: Self-pay | Admitting: Family Medicine

## 2018-12-21 ENCOUNTER — Other Ambulatory Visit: Payer: Self-pay | Admitting: Family Medicine

## 2018-12-25 DIAGNOSIS — R42 Dizziness and giddiness: Secondary | ICD-10-CM | POA: Diagnosis not present

## 2018-12-26 ENCOUNTER — Ambulatory Visit (INDEPENDENT_AMBULATORY_CARE_PROVIDER_SITE_OTHER): Payer: PPO | Admitting: Family Medicine

## 2018-12-26 ENCOUNTER — Encounter: Payer: Self-pay | Admitting: Family Medicine

## 2018-12-26 ENCOUNTER — Other Ambulatory Visit: Payer: Self-pay

## 2018-12-26 VITALS — BP 130/70

## 2018-12-26 DIAGNOSIS — K760 Fatty (change of) liver, not elsewhere classified: Secondary | ICD-10-CM | POA: Diagnosis not present

## 2018-12-26 DIAGNOSIS — E119 Type 2 diabetes mellitus without complications: Secondary | ICD-10-CM | POA: Diagnosis not present

## 2018-12-26 DIAGNOSIS — I10 Essential (primary) hypertension: Secondary | ICD-10-CM

## 2018-12-26 DIAGNOSIS — K7581 Nonalcoholic steatohepatitis (NASH): Secondary | ICD-10-CM | POA: Diagnosis not present

## 2018-12-26 MED ORDER — LOSARTAN POTASSIUM 100 MG PO TABS: 100.0000 mg | ORAL_TABLET | Freq: Every day | ORAL | 1 refills | Status: DC

## 2018-12-26 MED ORDER — ALBUTEROL SULFATE HFA 108 (90 BASE) MCG/ACT IN AERS: INHALATION_SPRAY | RESPIRATORY_TRACT | 5 refills | Status: DC

## 2018-12-26 MED ORDER — HYDROCHLOROTHIAZIDE 12.5 MG PO CAPS: ORAL_CAPSULE | ORAL | 1 refills | Status: DC

## 2018-12-26 MED ORDER — AMLODIPINE BESYLATE 2.5 MG PO TABS: ORAL_TABLET | ORAL | 1 refills | Status: DC

## 2018-12-26 MED ORDER — LEVOTHYROXINE SODIUM 150 MCG PO TABS: 150.0000 ug | ORAL_TABLET | Freq: Every day | ORAL | 1 refills | Status: DC

## 2018-12-26 MED ORDER — BUPROPION HCL ER (SR) 150 MG PO TB12: 150.0000 mg | ORAL_TABLET | Freq: Two times a day (BID) | ORAL | 1 refills | Status: DC

## 2018-12-26 NOTE — Progress Notes (Signed)
Subjective:    Patient ID: Kristen Mack, female    DOB: [birth year], 73 y.o.   MRN: [id number]  Hypertension This is a chronic problem. Pertinent negatives include no chest pain or shortness of breath. (Pt states her blood pressure drops when she stands up about 20-30 points. ) Risk factors for coronary artery disease include diabetes mellitus. There are no compliance problems.    She does relate occasionally her blood pressure is low and makes her feel bad and feels little lightheaded we did talk about the possibility of reducing her amlodipine down to 2.5 mg and she will send [place] updates on her blood pressure  Pt seen Dr.Teoh and he placed a tube in her left ear. Pt states the Benjamine Mola stated that she had some drainage. Dr.Teoh sent pt for balance testing for migraines. Pt is now seeing a physical therapist for balance about every 2 weeks.  Pt states that she suddenly started seeing "doughnuts' in her field of vision. 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 Pt states right eye has cleared up.   Previous labs were reviewed with the patient.  A1c relatively stable.  She is requesting a recheck this again later in this fall she is try to watch starches in the diet and stay physically active  She has morbid obesity she is trying to bring her weight down by watching portions and staying active.  Dietary discussed with patient   Virtual Visit via Video Note  I connected with [name] on 12/26/18 at  2:00 PM EDT by a video enabled telemedicine application and verified that I am speaking with the correct person using two identifiers.  Location: Patient: home Provider: office   I discussed the limitations of evaluation and management by telemedicine and the availability of in person appointments. The patient expressed understanding and agreed to proceed.  History of Present Illness:    Observations/Objective:   Assessment and  Plan:   Follow Up Instructions:    I discussed the assessment and treatment plan with the patient. The patient was provided an opportunity to ask questions and all were answered. The patient agreed with the plan and demonstrated an understanding of the instructions.   The patient was advised to call back or seek an in-person evaluation if the symptoms worsen or if the condition fails to improve as anticipated.  I provided you1 annual in the  25  minutes of non-face-to-face time during this encounter.   Vicente Males, LPN     Review of Systems  Constitutional: Negative for activity change, appetite change and fatigue.  HENT: Negative for congestion and rhinorrhea.   Respiratory: Negative for cough and shortness of breath.   Cardiovascular: Negative for chest pain and leg swelling.  Gastrointestinal: Negative for abdominal pain and diarrhea.  Endocrine: Negative for polydipsia and polyphagia.  Skin: Negative for color change.  Neurological: Negative for dizziness and weakness.  Psychiatric/Behavioral: Negative for behavioral problems and confusion.       Objective:   Physical Exam  Today's visit was via telephone Physical exam was not possible for this visit   25 minutes was spent with the patient.  This statement verifies that 25 minutes was indeed spent with the patient.  More than 50% of this visit-total duration of the visit-was spent in counseling and coordination of care. The issues that the patient came in for today as reflected in the diagnosis (s) please refer to documentation for  further details.     Assessment & Plan:  1. HTN (hypertension), benign Blood pressure reportedly under good condition taking medication watching diet and staying active  2. Diabetes mellitus without complication ([place]) Diabetes reportedly under fair control request to recheck A1c at the end of October she does follow-up with Dr. Dorris Fetch - Hemoglobin A1c  3. Fatty liver She does have  fatty liver and mild obesity she is can work hard on her diet activity and try to bring her weight down  4. NASH (nonalcoholic steatohepatitis) Her liver enzymes are actually somewhat better compared to previous liver enzymes and she denies any excessive swelling issues.  With this will be monitored ongoing.  5. Morbid obesity ([place]) Morbid obesity she will watch portions activity and diet and try to bring the weight down.  Relative hypotension at times her blood pressure is a little bit on the low end so therefore reducing amlodipine from 5 mg down to 2.5 mg and the patient is to monitor blood pressure and give [place] feedback regarding this

## 2018-12-28 ENCOUNTER — Telehealth: Payer: Self-pay | Admitting: Family Medicine

## 2018-12-28 NOTE — Telephone Encounter (Signed)
Script written out and awaiting signature. Will fax to [facility] once signed. Pt contacted and is aware. Pt verbalized understanding

## 2018-12-28 NOTE — Telephone Encounter (Signed)
Needs an updated rx sent for cpap supplies to [facility]. (She got her cpap machine originally from there.)

## 2018-12-28 NOTE — Telephone Encounter (Signed)
That would be fine please do I will sign

## 2019-01-17 ENCOUNTER — Encounter: Payer: Self-pay | Admitting: Family Medicine

## 2019-01-17 DIAGNOSIS — R42 Dizziness and giddiness: Secondary | ICD-10-CM | POA: Diagnosis not present

## 2019-01-31 DIAGNOSIS — R42 Dizziness and giddiness: Secondary | ICD-10-CM | POA: Diagnosis not present

## 2019-01-31 DIAGNOSIS — E119 Type 2 diabetes mellitus without complications: Secondary | ICD-10-CM | POA: Diagnosis not present

## 2019-02-01 LAB — HEMOGLOBIN A1C
Est. average glucose Bld gHb Est-mCnc: 148 mg/dL
Hgb A1c MFr Bld: 6.8 % — ABNORMAL HIGH (ref 4.8–5.6)

## 2019-02-02 DIAGNOSIS — G4733 Obstructive sleep apnea (adult) (pediatric): Secondary | ICD-10-CM | POA: Diagnosis not present

## 2019-02-12 DIAGNOSIS — R42 Dizziness and giddiness: Secondary | ICD-10-CM | POA: Diagnosis not present

## 2019-02-19 ENCOUNTER — Ambulatory Visit
Admission: EM | Admit: 2019-02-19 | Discharge: 2019-02-19 | Disposition: A | Discharge status: Home / Self Care | Payer: PPO | Attending: Emergency Medicine | Admitting: Emergency Medicine

## 2019-02-19 ENCOUNTER — Other Ambulatory Visit: Payer: Self-pay

## 2019-02-19 DIAGNOSIS — M5442 Lumbago with sciatica, left side: Secondary | ICD-10-CM

## 2019-02-19 DIAGNOSIS — M25552 Pain in left hip: Secondary | ICD-10-CM

## 2019-02-19 MED ORDER — NAPROXEN 375 MG PO TABS: 375.0000 mg | ORAL_TABLET | Freq: Two times a day (BID) | ORAL | 0 refills | Status: DC

## 2019-02-19 MED ORDER — TIZANIDINE HCL 4 MG PO CAPS: 4.0000 mg | ORAL_CAPSULE | Freq: Three times a day (TID) | ORAL | 0 refills | Status: DC

## 2019-02-19 NOTE — Discharge Instructions (Addendum)
Unable to steroid given allergy Continue conservative management of rest, ice, heat, massage and gentle stretches Take naproxen as needed for pain relief (may cause abdominal discomfort, ulcers, and GI bleeds avoid taking with other NSAIDs) Take muscle relaxer as prescribed.  AVOID DRIVING OR OPERATING HEAVY MACHINERY as this medication may make you drowsy Anticipate follow up with PCP and/or back specialist for reoccurence Return or go to the ER if you have any new or worsening symptoms (fever, chills, chest pain, abdominal pain, changes in bowel or bladder habits, pain radiating into lower legs, etc...)

## 2019-02-19 NOTE — ED Triage Notes (Signed)
Pt has chronic pain but pain has exacerbated in left hip pain and into groin . States pain is 3 with no movement but 10 with movement

## 2019-02-19 NOTE — ED Provider Notes (Signed)
[place]   [id number] 02/19/19 Arrival Time: 1202  CC: Back PAIN; Left hip pain  SUBJECTIVE: History from: patient and family. Kristen Mack is a 73 y.o. female complains of low back and LT hip pain that began 2 weeks agp with worsening symptoms over the past 2 days.  Denies a precipitating event or specific injury.  Localizes the pain to the outside of left hip and LT low back.  Describes the pain as intermittent and dull/ achy in character.  Has tried OTC medications and stretching with minimal relief.  Symptoms are made worse with movement, such as walking and wiping after using the restroom.  Denies similar symptoms in the past.  Denies fever, chills, erythema, ecchymosis, effusion, weakness, numbness and tingling, saddle paresthesias, loss of bowel or bladder function.      ROS: As per HPI.  All other pertinent ROS negative.     Past Medical History:  Diagnosis Date  . Arthritis   . Asthma   . CFS (chronic fatigue syndrome)   . Cracked tooth    X2  RIGHT LOWER MOLAR AND LEFT UPPER MOLAR  . Fatty liver disease, nonalcoholic   . Fibromyalgia   . GERD (gastroesophageal reflux disease)   . History of adenomatous polyp of colon   . History of cardiac murmur    until age 50  . History of TIA (transient ischemic attack)    per CT scan   . Hypertension   . Hypothyroidism   . IBS (irritable bowel syndrome)   . Meniere disease   . OSA (obstructive sleep apnea)    CPAP intolerate -- per study 2010  moderate osa--  STATES HAS RAISED HOB  . Osteopenia   . PMB (postmenopausal bleeding)   . Prediabetes   . Sleep apnea 07/11/2017   Abnormal home sleep apnea test February 2019-CPAP ordered  . Tooth loose    LOWER FRONT   Past Surgical History:  Procedure Laterality Date  . [place]  . CHOLECYSTECTOMY  1995   and  REPAIR UMBILICAL HERNIA  . COLONOSCOPY N/A 04/19/2013   Procedure: COLONOSCOPY;  Surgeon: Rogene Houston, MD;  Location: [facility];  Service: Endoscopy;  Laterality: N/A;    . ESOPHAGOGASTRODUODENOSCOPY  02-08-2003  . HYSTEROSCOPY W/D&C  11-14-2002   endometrial polypectomy  . HYSTEROSCOPY W/D&C N/A 04/05/2014   Procedure: DILATATION AND CURETTAGE /HYSTEROSCOPY, ENDOMETRIAL POLYPECTOMY;  Surgeon: Sanjuana Kava, MD;  Location: [facility];  Service: Gynecology;  Laterality: N/A;  . LIVER BIOPSY  01-29-2004   benign   Allergies  Allergen Reactions  . Ambien [Zolpidem Tartrate] Other (See Comments)    Side effect "last a couple days"  . Cefzil [Cefprozil]     diarrhea  . Codeine Itching and Nausea And Vomiting  . Demerol [Meperidine] Other (See Comments)    "messes up senses"  . Formaldehyde   . Latex Other (See Comments)    "skin gets raw"  . Sulfa Antibiotics Other (See Comments)    Severe abd cramp  . Celexa [Citalopram Hydrobromide] Other (See Comments)    Mouth sores   No current facility-administered medications on file prior to encounter.    Current Outpatient Medications on File Prior to Encounter  Medication Sig Dispense Refill  . albuterol (VENTOLIN HFA) 108 (90 Base) MCG/ACT inhaler INHALE 2 PUFFS INTO LUNGS EVERY 6 HOURS AS NEEDED FOR SHORTNESS OF BREATH 27 g 5  . AMBULATORY NON FORMULARY MEDICATION  Medication Name: Liver Essentials- Take 2 capsules by mouth 2-3 times daily    . amLODipine (NORVASC) 2.5 MG tablet Take one tablet by mouth daily 90 tablet 1  . B Complex Vitamins (B COMPLEX 100 PO) Take 1 tablet by mouth daily.    Jolyne Loa Grape-Goldenseal (BERBERINE COMPLEX PO) Take by mouth. 530m. Takes 3 tabs 2 -3 times a day    . buPROPion (WELLBUTRIN SR) 150 MG 12 hr tablet Take 1 tablet (150 mg total) by mouth 2 (two) times daily. 180 tablet 1  . Calcium Carb-Cholecalciferol (CALCIUM 500 + D3 PO) Take by mouth.    . cetirizine (ZYRTEC) 10 MG tablet Take 10 mg by mouth daily as needed.     . Chromium 1000 MCG TABS Take 1 tablet by mouth daily.     .Marland KitchenCINNAMON PO Take  by mouth. gymnea    . Coenzyme Q10 (COQ10) 200 MG CAPS Take 1 capsule by mouth daily.    . Cyanocobalamin (B-12) 1000 MCG SUBL Place 1 tablet under the tongue daily.     .Marland KitchenDIGESTIVE ENZYMES PO Take 1 tablet by mouth 3 (three) times daily.     . fluticasone (FLONASE) 50 MCG/ACT nasal spray USE 2 SPRAY(S) IN EACH NOSTRIL ONCE DAILY FOR CONGESTION 16 g 3  . glucose blood (ONETOUCH VERIO) test strip Test once daily 100 each 1  . hydrochlorothiazide (MICROZIDE) 12.5 MG capsule Take 1 capsule by mouth once daily in the morning 90 capsule 1  . ibuprofen (ADVIL,MOTRIN) 200 MG tablet Take 200-400 mg by mouth every 6 (six) hours as needed. For pain    . ipratropium (ATROVENT) 0.02 % nebulizer solution USE ONE VIAL IN NEBULIZER 4 TIMES DAILY AS NEEDED FOR SHORTNESS OF BREATH 1 mL 6  . levothyroxine (SYNTHROID) 150 MCG tablet Take 1 tablet (150 mcg total) by mouth daily. 90 tablet 1  . losartan (COZAAR) 100 MG tablet Take 1 tablet (100 mg total) by mouth daily. 90 tablet 1  . Lysine HCl 500 MG TABS Take 1 tablet by mouth daily. Take as needed    . Magnesium 250 MG TABS Take by mouth 2 (two) times daily.    . meclizine (ANTIVERT) 50 MG tablet Take 50 mg by mouth daily as needed for dizziness. Take as needed    . Melatonin 10 MG CAPS Take by mouth at bedtime.    . montelukast (SINGULAIR) 10 MG tablet TAKE 1 TABLET BY MOUTH AT BEDTIME 30 tablet 5  . OVER THE COUNTER MEDICATION Tumeric plus beperrine. Once daily.    .Marland KitchenOVER THE COUNTER MEDICATION Fennel seeds. One tsp with meals    . OVER THE COUNTER MEDICATION gymnia plus berberine one daily and  gymnia plus cinnamon one daily    . potassium chloride SA (KLOR-CON M20) 20 MEQ tablet Take 1 tablet (20 mEq total) by mouth daily. 90 tablet 3  . VITAMIN A PO Take 10,000 mcg by mouth daily.     . vitamin C (ASCORBIC ACID) 500 MG tablet Take 6,000 mg by mouth daily.     . vitamin E 100 UNIT capsule Take 800 Units by mouth daily.      Social History    Socioeconomic History  . Marital status: Married    Spouse name: Not on file  . Number of children: 2  . Years of education: Not on file  . Highest education level: Not on file  Occupational History  . 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  Financial resource strain: Not on file  . Food insecurity    Worry: Not on file    Inability: Not on file  . Transportation needs    Medical: Not on file    Non-medical: Not on file  Tobacco Use  . Smoking status: Former Smoker    Packs/day: 0.50    Years: 3.00    Pack years: 1.50    Types: Cigarettes    Quit date: 10/03/1968    Years since quitting: 50.4  . Smokeless tobacco: Never Used  Substance and Sexual Activity  . Alcohol use: No    Alcohol/week: 0.0 standard drinks  . Drug use: No  . Sexual activity: Not on file  Lifestyle  . Physical activity    Days per week: Not on file    Minutes per session: Not on file  . Stress: Not on file  Relationships  . Social Herbalist on phone: Not on file    Gets together: Not on file    Attends religious service: Not on file    Active member of club or organization: Not on file    Attends meetings of clubs or organizations: Not on file    Relationship status: Not on file  . Intimate partner violence    Fear of current or ex partner: Not on file    Emotionally abused: Not on file    Physically abused: Not on file    Forced sexual activity: Not on file  Other Topics Concern  . Not on file  Social History Narrative  . Not on file   Family History  Problem Relation Age of Onset  . Rectal cancer Brother 82  . Ovarian cancer Mother 77  . Hypertension Mother   . Diabetes Mother   . Hypertension Father   . Diabetes Father   . Breast cancer Maternal Aunt   . Breast cancer Paternal Aunt        four Paternal aunts  . Heart disease Paternal Grandfather   . Liver disease Sister        x 2; fatty liver  . Pancreatic cancer Neg Hx   . Esophageal cancer Neg Hx      OBJECTIVE:  Vitals:   02/19/19 1221  BP: (!) 188/71  Pulse: 76  Resp: 20  Temp: 97.9 F (36.6 C)  SpO2: 96%    General appearance: ALERT; in no acute distress.  Head: NCAT Lungs: Normal respiratory effort; CTAB CV: RRR Musculoskeletal: Back Inspection: Skin warm, dry, clear and intact without obvious erythema, effusion, or ecchymosis.  Palpation: Diffusely TTP over LT low back and lateral hip ROM: FROM active and passive about lumbar spine and hip Strength: 5/5 hip flexion, 5/5 knee abduction, 5/5 knee adduction, 5/5 knee flexion, 5/5 knee extension Skin: warm and dry Neurologic: Ambulates without difficulty; Sensation intact about the upper/ lower extremities Psychological: alert and cooperative; normal mood and affect   ASSESSMENT & PLAN:  1. Acute left-sided low back pain with left-sided sciatica   2. Left hip pain    Meds ordered this encounter  Medications  . naproxen (NAPROSYN) 375 MG tablet    Sig: Take 1 tablet (375 mg total) by mouth 2 (two) times daily.    Dispense:  20 tablet    Refill:  0    Order Specific Question:   Supervising Provider    Answer:   Raylene Everts [[id number]]  . tiZANidine (ZANAFLEX) 4 MG capsule    Sig: Take 1  capsule (4 mg total) by mouth 3 (three) times daily.    Dispense:  30 capsule    Refill:  0    Order Specific Question:   Supervising Provider    Answer:   Raylene Everts [[id number]]   Unable to steroid given allergy Continue conservative management of rest, ice, heat, massage and gentle stretches Take naproxen as needed for pain relief (may cause abdominal discomfort, ulcers, and GI bleeds avoid taking with other NSAIDs) Take muscle relaxer as prescribed Anticipate follow up with PCP and/or back specialist for reoccurence Return or go to the ER if you have any new or worsening symptoms (fever, chills, chest pain, abdominal pain, changes in bowel or bladder habits, pain radiating into lower legs, etc...)   Reviewed  expectations re: course of current medical issues. Questions answered. Outlined signs and symptoms indicating need for more acute intervention. Patient verbalized understanding. After Visit Summary given.    Lestine Box, PA-C 02/19/19 1554

## 2019-02-26 ENCOUNTER — Telehealth: Payer: Self-pay | Admitting: Family Medicine

## 2019-02-26 NOTE — Telephone Encounter (Signed)
If the patient is having weakness we can do a office visit If it is more pain without weakness we recommend virtual Discussed with patient let her decide

## 2019-02-26 NOTE — Telephone Encounter (Signed)
Pt wants to be seen in office for sciatica pain. Are we ok to schedule pt for in office or should we stick with virtual?

## 2019-02-26 NOTE — Telephone Encounter (Signed)
Discussed with pt. She states a little weakness but mostly pain and she wants to come in. Transferred to front to schedule pt in office

## 2019-02-27 ENCOUNTER — Ambulatory Visit (INDEPENDENT_AMBULATORY_CARE_PROVIDER_SITE_OTHER): Payer: PPO | Admitting: Family Medicine

## 2019-02-27 ENCOUNTER — Encounter: Payer: Self-pay | Admitting: Family Medicine

## 2019-02-27 ENCOUNTER — Other Ambulatory Visit: Payer: Self-pay

## 2019-02-27 ENCOUNTER — Telehealth: Payer: Self-pay | Admitting: Family Medicine

## 2019-02-27 VITALS — BP 138/70 | Temp 97.5°F | Wt 217.6 lb

## 2019-02-27 DIAGNOSIS — M5432 Sciatica, left side: Secondary | ICD-10-CM | POA: Diagnosis not present

## 2019-02-27 MED ORDER — TRAMADOL HCL 50 MG PO TABS: 50.0000 mg | ORAL_TABLET | Freq: Three times a day (TID) | ORAL | 0 refills | Status: AC | PRN

## 2019-02-27 MED ORDER — NAPROXEN 375 MG PO TABS: 375.0000 mg | ORAL_TABLET | Freq: Two times a day (BID) | ORAL | 1 refills | Status: DC

## 2019-02-27 MED ORDER — TIZANIDINE HCL 4 MG PO CAPS: 4.0000 mg | ORAL_CAPSULE | Freq: Three times a day (TID) | ORAL | 0 refills | Status: DC | PRN

## 2019-02-27 NOTE — Patient Instructions (Signed)
If not better within 6 weeks notify me  Physical therapy will help Please check into this We can do an order

## 2019-02-27 NOTE — Telephone Encounter (Signed)
Pt is needing refills on tiZANidine (ZANAFLEX) 4 MG capsule & naproxen (NAPROSYN) 375 MG tablet.   [facility], [place] - [address]

## 2019-02-27 NOTE — Telephone Encounter (Signed)
Please advise. Thank you

## 2019-02-27 NOTE — Telephone Encounter (Signed)
Pt contacted and verbalized understanding.  

## 2019-02-27 NOTE — Telephone Encounter (Signed)
Additional refills were sent in today thank you

## 2019-02-27 NOTE — Progress Notes (Signed)
   Subjective:    Patient ID: Kristen Mack, female    DOB: 10/30/1945, 73 y.o.   MRN: [id number]  HPI Pt is having pain in left hip and left part of back. Pain was so bad about a week ago, she had to go to Urgent Care (02/19/2019). Pain began 3 weeks; within those three weeks the pain has become worse. The weekend was before Thanksgiving was terrible per patient. Pt goes to PT periodically for balance. Tried stretching and that has not helped. PA at Urgent Care wanted pt to be on Prednisone but pt states that eye dr does not want her on Prednisone. Pt was given Zanaflex and Naprosyn. Walking is not uncomfortable but sitting in the chair is uncomfortable.   Fall Risk  10/27/2018 03/06/2018 07/05/2017 06/16/2017  Falls in the past year? 0 0 No No  Comment Emmi Telephone Survey: data to providers prior to load - - -  Number falls in past yr: - 0 - -  Injury with Fall? - 0 - -    Review of Systems  Constitutional: Negative for activity change and appetite change.  HENT: Negative for congestion and rhinorrhea.   Respiratory: Negative for cough and shortness of breath.   Cardiovascular: Negative for chest pain and leg swelling.  Gastrointestinal: Negative for abdominal pain, nausea and vomiting.  Musculoskeletal: Positive for back pain. Negative for arthralgias.  Skin: Negative for color change.  Neurological: Negative for dizziness and weakness.  Psychiatric/Behavioral: Negative for agitation and confusion.       Objective:   Physical Exam Vitals signs reviewed.  Constitutional:      General: She is not in acute distress. HENT:     Head: Normocephalic.  Cardiovascular:     Rate and Rhythm: Normal rate and regular rhythm.     Heart sounds: Normal heart sounds. No murmur.  Pulmonary:     Effort: Pulmonary effort is normal.     Breath sounds: Normal breath sounds.  Lymphadenopathy:     Cervical: No cervical adenopathy.  Neurological:     Mental Status: She is alert.   Psychiatric:        Behavior: Behavior normal.   Negative straight leg raise patient traces her pain from her lower back through the buttock into the left hip region has good internal and external rotation good strength in the muscles        Assessment & Plan:  I believe to a degree this is some mild sciatica.  We did discuss various approaches currently.  Patient will try tramadol when necessary for severe pain She will do stretches She will look into physical therapy in [place] where she would like to go if she needs a referral she will notify us Hold off on any imaging currently Follow-up in 6 weeks if not doing well No sign of muscle weakness currently.

## 2019-03-02 ENCOUNTER — Encounter: Payer: Self-pay | Admitting: Family Medicine

## 2019-03-02 NOTE — Telephone Encounter (Signed)
Nurses  I saw patient for sciatica Please write out an order for physical therapy for sciatica Evaluate and treat I'll sign then fax it to her physical therapy dept  Also forward copy of this message via mychart to patient Thanks Dr Nicki Reaper

## 2019-03-05 ENCOUNTER — Other Ambulatory Visit: Payer: Self-pay

## 2019-03-05 ENCOUNTER — Ambulatory Visit (INDEPENDENT_AMBULATORY_CARE_PROVIDER_SITE_OTHER): Payer: PPO | Admitting: Otolaryngology

## 2019-03-19 ENCOUNTER — Encounter (HOSPITAL_COMMUNITY): Payer: PPO

## 2019-03-19 DIAGNOSIS — M5442 Lumbago with sciatica, left side: Secondary | ICD-10-CM | POA: Diagnosis not present

## 2019-03-27 ENCOUNTER — Encounter: Payer: Self-pay | Admitting: Family Medicine

## 2019-03-27 DIAGNOSIS — M810 Age-related osteoporosis without current pathological fracture: Secondary | ICD-10-CM

## 2019-03-27 DIAGNOSIS — M8580 Other specified disorders of bone density and structure, unspecified site: Secondary | ICD-10-CM

## 2019-03-28 ENCOUNTER — Other Ambulatory Visit: Payer: Self-pay

## 2019-03-28 MED ORDER — TIZANIDINE HCL 4 MG PO CAPS: 4.0000 mg | ORAL_CAPSULE | Freq: Three times a day (TID) | ORAL | 0 refills | Status: DC | PRN

## 2019-03-28 NOTE — Telephone Encounter (Signed)
Nurses May reorder Reclast infusion yearly  Inform the patient that bone density imaging when on medication is actually not recommended because typically 1 does not seem major changes with the bone density but if she would like to have one ordered it has been 2 years since she may have 1 if she would like to have 1 but as far as the standard of care when on therapy its not recommended and typically we would do 1 3 to 5 years after starting therapy Her choice  She may have a refill of her Zanaflex

## 2019-03-28 NOTE — Addendum Note (Signed)
Addended by: Vicente Males on: 03/28/2019 11:28 AM   Modules accepted: Orders

## 2019-03-30 DIAGNOSIS — Z8719 Personal history of other diseases of the digestive system: Secondary | ICD-10-CM

## 2019-03-30 HISTORY — DX: Personal history of other diseases of the digestive system: Z87.19

## 2019-04-09 DIAGNOSIS — M5442 Lumbago with sciatica, left side: Secondary | ICD-10-CM | POA: Diagnosis not present

## 2019-04-10 ENCOUNTER — Other Ambulatory Visit: Payer: Self-pay | Admitting: Family Medicine

## 2019-04-11 ENCOUNTER — Telehealth: Payer: Self-pay | Admitting: Family Medicine

## 2019-04-11 NOTE — Telephone Encounter (Signed)
Please sign & date the Reclast order so I may fax  In red folder in basket on wall

## 2019-04-15 NOTE — Telephone Encounter (Signed)
The form was completed thank you

## 2019-04-16 NOTE — Telephone Encounter (Signed)
Faxed order to [facility] short stay - filed

## 2019-04-18 DIAGNOSIS — M5442 Lumbago with sciatica, left side: Secondary | ICD-10-CM | POA: Diagnosis not present

## 2019-05-25 ENCOUNTER — Encounter (HOSPITAL_COMMUNITY)
Admission: RE | Admit: 2019-05-25 | Discharge: 2019-05-25 | Disposition: A | Discharge status: Home / Self Care | Payer: PPO | Source: Ambulatory Visit | Attending: Family Medicine | Admitting: Family Medicine

## 2019-06-27 ENCOUNTER — Ambulatory Visit (INDEPENDENT_AMBULATORY_CARE_PROVIDER_SITE_OTHER): Payer: PPO | Admitting: Family Medicine

## 2019-06-27 VITALS — BP 164/92 | Temp 96.8°F | Wt 213.6 lb

## 2019-06-27 DIAGNOSIS — K7581 Nonalcoholic steatohepatitis (NASH): Secondary | ICD-10-CM

## 2019-06-27 DIAGNOSIS — E119 Type 2 diabetes mellitus without complications: Secondary | ICD-10-CM

## 2019-06-27 DIAGNOSIS — R0600 Dyspnea, unspecified: Secondary | ICD-10-CM | POA: Diagnosis not present

## 2019-06-27 DIAGNOSIS — D3502 Benign neoplasm of left adrenal gland: Secondary | ICD-10-CM | POA: Diagnosis not present

## 2019-06-27 DIAGNOSIS — R7401 Elevation of levels of liver transaminase levels: Secondary | ICD-10-CM

## 2019-06-27 DIAGNOSIS — I359 Nonrheumatic aortic valve disorder, unspecified: Secondary | ICD-10-CM | POA: Diagnosis not present

## 2019-06-27 DIAGNOSIS — E038 Other specified hypothyroidism: Secondary | ICD-10-CM | POA: Diagnosis not present

## 2019-06-27 DIAGNOSIS — R0609 Other forms of dyspnea: Secondary | ICD-10-CM

## 2019-06-27 DIAGNOSIS — I10 Essential (primary) hypertension: Secondary | ICD-10-CM

## 2019-06-27 DIAGNOSIS — Z79899 Other long term (current) drug therapy: Secondary | ICD-10-CM

## 2019-06-27 MED ORDER — AMLODIPINE BESYLATE 5 MG PO TABS: ORAL_TABLET | ORAL | 5 refills | Status: DC

## 2019-06-27 MED ORDER — TIZANIDINE HCL 4 MG PO CAPS: 4.0000 mg | ORAL_CAPSULE | Freq: Three times a day (TID) | ORAL | 0 refills | Status: DC | PRN

## 2019-06-27 MED ORDER — AZELASTINE HCL 0.1 % NA SOLN: 2.0000 | Freq: Two times a day (BID) | NASAL | 12 refills | Status: DC

## 2019-06-27 NOTE — Patient Instructions (Signed)
DASH Eating Plan DASH stands for "Dietary Approaches to Stop Hypertension." The DASH eating plan is a healthy eating plan that has been shown to reduce high blood pressure (hypertension). It may also reduce your risk for type 2 diabetes, heart disease, and stroke. The DASH eating plan may also help with weight loss. What are tips for following this plan?  General guidelines  Avoid eating more than 2,300 mg (milligrams) of salt (sodium) a day. If you have hypertension, you may need to reduce your sodium intake to 1,500 mg a day.  Limit alcohol intake to no more than 1 drink a day for nonpregnant women and 2 drinks a day for men. One drink equals 12 oz of beer, 5 oz of wine, or 1 oz of hard liquor.  Work with your health care provider to maintain a healthy body weight or to lose weight. Ask what an ideal weight is for you.  Get at least 30 minutes of exercise that causes your heart to beat faster (aerobic exercise) most days of the week. Activities may include walking, swimming, or biking.  Work with your health care provider or diet and nutrition specialist (dietitian) to adjust your eating plan to your individual calorie needs. Reading food labels   Check food labels for the amount of sodium per serving. Choose foods with less than 5 percent of the Daily Value of sodium. Generally, foods with less than 300 mg of sodium per serving fit into this eating plan.  To find whole grains, look for the word "whole" as the first word in the ingredient list. Shopping  Buy products labeled as "low-sodium" or "no salt added."  Buy fresh foods. Avoid canned foods and premade or frozen meals. Cooking  Avoid adding salt when cooking. Use salt-free seasonings or herbs instead of table salt or sea salt. Check with your health care provider or pharmacist before using salt substitutes.  Do not fry foods. Cook foods using healthy methods such as baking, boiling, grilling, and broiling instead.  Cook with  heart-healthy oils, such as olive, canola, soybean, or sunflower oil. Meal planning  Eat a balanced diet that includes: ? 5 or more servings of fruits and vegetables each day. At each meal, try to fill half of your plate with fruits and vegetables. ? Up to 6-8 servings of whole grains each day. ? Less than 6 oz of lean meat, poultry, or fish each day. A 3-oz serving of meat is about the same size as a deck of cards. One egg equals 1 oz. ? 2 servings of low-fat dairy each day. ? A serving of nuts, seeds, or beans 5 times each week. ? Heart-healthy fats. Healthy fats called Omega-3 fatty acids are found in foods such as flaxseeds and coldwater fish, like sardines, salmon, and mackerel.  Limit how much you eat of the following: ? Canned or prepackaged foods. ? Food that is high in trans fat, such as fried foods. ? Food that is high in saturated fat, such as fatty meat. ? Sweets, desserts, sugary drinks, and other foods with added sugar. ? Full-fat dairy products.  Do not salt foods before eating.  Try to eat at least 2 vegetarian meals each week.  Eat more home-cooked food and less restaurant, buffet, and fast food.  When eating at a restaurant, ask that your food be prepared with less salt or no salt, if possible. What foods are recommended? The items listed may not be a complete list. Talk with your dietitian about   what dietary choices are best for you. Grains Whole-grain or whole-wheat bread. Whole-grain or whole-wheat pasta. Brown rice. Oatmeal. Quinoa. Bulgur. Whole-grain and low-sodium cereals. Pita bread. Low-fat, low-sodium crackers. Whole-wheat flour tortillas. Vegetables Fresh or frozen vegetables (raw, steamed, roasted, or grilled). Low-sodium or reduced-sodium tomato and vegetable juice. Low-sodium or reduced-sodium tomato sauce and tomato paste. Low-sodium or reduced-sodium canned vegetables. Fruits All fresh, dried, or frozen fruit. Canned fruit in natural juice (without  added sugar). Meat and other protein foods Skinless chicken or turkey. Ground chicken or turkey. Pork with fat trimmed off. Fish and seafood. Egg whites. Dried beans, peas, or lentils. Unsalted nuts, nut butters, and seeds. Unsalted canned beans. Lean cuts of beef with fat trimmed off. Low-sodium, lean deli meat. Dairy Low-fat (1%) or fat-free (skim) milk. Fat-free, low-fat, or reduced-fat cheeses. Nonfat, low-sodium ricotta or cottage cheese. Low-fat or nonfat yogurt. Low-fat, low-sodium cheese. Fats and oils Soft margarine without trans fats. Vegetable oil. Low-fat, reduced-fat, or light mayonnaise and salad dressings (reduced-sodium). Canola, safflower, olive, soybean, and sunflower oils. Avocado. Seasoning and other foods Herbs. Spices. Seasoning mixes without salt. Unsalted popcorn and pretzels. Fat-free sweets. What foods are not recommended? The items listed may not be a complete list. Talk with your dietitian about what dietary choices are best for you. Grains Baked goods made with fat, such as croissants, muffins, or some breads. Dry pasta or rice meal packs. Vegetables Creamed or fried vegetables. Vegetables in a cheese sauce. Regular canned vegetables (not low-sodium or reduced-sodium). Regular canned tomato sauce and paste (not low-sodium or reduced-sodium). Regular tomato and vegetable juice (not low-sodium or reduced-sodium). Pickles. Olives. Fruits Canned fruit in a light or heavy syrup. Fried fruit. Fruit in cream or butter sauce. Meat and other protein foods Fatty cuts of meat. Ribs. Fried meat. Bacon. Sausage. Bologna and other processed lunch meats. Salami. Fatback. Hotdogs. Bratwurst. Salted nuts and seeds. Canned beans with added salt. Canned or smoked fish. Whole eggs or egg yolks. Chicken or turkey with skin. Dairy Whole or 2% milk, cream, and half-and-half. Whole or full-fat cream cheese. Whole-fat or sweetened yogurt. Full-fat cheese. Nondairy creamers. Whipped toppings.  Processed cheese and cheese spreads. Fats and oils Butter. Stick margarine. Lard. Shortening. Ghee. Bacon fat. Tropical oils, such as coconut, palm kernel, or palm oil. Seasoning and other foods Salted popcorn and pretzels. Onion salt, garlic salt, seasoned salt, table salt, and sea salt. Worcestershire sauce. Tartar sauce. Barbecue sauce. Teriyaki sauce. Soy sauce, including reduced-sodium. Steak sauce. Canned and packaged gravies. Fish sauce. Oyster sauce. Cocktail sauce. Horseradish that you find on the shelf. Ketchup. Mustard. Meat flavorings and tenderizers. Bouillon cubes. Hot sauce and Tabasco sauce. Premade or packaged marinades. Premade or packaged taco seasonings. Relishes. Regular salad dressings. Where to find more information:  National Heart, Lung, and Blood Institute: www.nhlbi.nih.gov  American Heart Association: www.heart.org Summary  The DASH eating plan is a healthy eating plan that has been shown to reduce high blood pressure (hypertension). It may also reduce your risk for type 2 diabetes, heart disease, and stroke.  With the DASH eating plan, you should limit salt (sodium) intake to 2,300 mg a day. If you have hypertension, you may need to reduce your sodium intake to 1,500 mg a day.  When on the DASH eating plan, aim to eat more fresh fruits and vegetables, whole grains, lean proteins, low-fat dairy, and heart-healthy fats.  Work with your health care provider or diet and nutrition specialist (dietitian) to adjust your eating plan to your   individual calorie needs. This information is not intended to replace advice given to you by your health care provider. Make sure you discuss any questions you have with your health care provider. Document Revised: 02/25/2017 Document Reviewed: 03/08/2016 Elsevier Patient Education  2020 [employer]  

## 2019-06-27 NOTE — Progress Notes (Signed)
Subjective:    Patient ID: Kristen Mack, female    DOB: [birth year], 74 y.o.   MRN: [id number]  HPI Patient comes in today to discuss hypertension following headaches, dizziness and nausea. Per daughter this has gotten worse since 2nd Covid vaccine last week.  HTN (hypertension), benign - Plan: Lipid panel, Basic metabolic panel  NASH (nonalcoholic steatohepatitis)  Other specified hypothyroidism - Plan: TSH  Diabetes mellitus without complication ([place]) - Plan: Microalbumin / creatinine urine ratio, Lipid panel, Hemoglobin A1c  Elevated transaminase level  Adenoma of left adrenal gland - Plan: Cortisol  Aortic valve disease  DOE (dyspnea on exertion) - Plan: ECHOCARDIOGRAM COMPLETE, CANCELED: ECHOCARDIOGRAM COMPLETE  High risk medication use - Plan: Hepatic function panel Patient does have fatty liver.  She also has some mild weight issues and she is trying to work hard on Also diabetes issues and she does try to be mindful of the starches in her diet but she does eat fast food more so since the pandemic does have some history of aortic valve disease gets out of breath with activity but no PND no orthopnea she also has a history of a adrenal adenoma that she is concerned about but this was been benign in the past  Patient also mentions her sugars have been harder to control.   Review of Systems  Constitutional: Positive for fatigue. Negative for activity change and appetite change.  HENT: Negative for congestion and rhinorrhea.   Respiratory: Negative for cough and shortness of breath.   Cardiovascular: Negative for chest pain and leg swelling.  Gastrointestinal: Positive for nausea. Negative for abdominal pain and diarrhea.  Endocrine: Negative for polydipsia and polyphagia.  Skin: Negative for color change.  Neurological: Positive for weakness. Negative for dizziness.  Psychiatric/Behavioral: Negative for behavioral problems and confusion.       Objective:   Physical Exam Vitals reviewed.  Constitutional:      General: She is not in acute distress. HENT:     Head: Normocephalic and atraumatic.  Eyes:     General:        Right eye: No discharge.        Left eye: No discharge.  Neck:     Trachea: No tracheal deviation.  Cardiovascular:     Rate and Rhythm: Normal rate and regular rhythm.     Heart sounds: Normal heart sounds. No murmur.  Pulmonary:     Effort: Pulmonary effort is normal. No respiratory distress.     Breath sounds: Normal breath sounds.  Lymphadenopathy:     Cervical: No cervical adenopathy.  Skin:    General: Skin is warm and dry.  Neurological:     Mental Status: She is alert.     Coordination: Coordination normal.  Psychiatric:        Behavior: Behavior normal.           Assessment & Plan:  1. HTN (hypertension), benign Poor control with neuro sx Up meds Monitor bp Recheck 3 weeks Cut down on salt - Lipid panel - Basic metabolic panel  2. NASH (nonalcoholic steatohepatitis) Watch diet exercise diet loose weight check labs  3. Other specified hypothyroidism Check labs cont meds - TSH  4. Diabetes mellitus without complication ([facility]) Minimize sugar exercise check a1c - Microalbumin / creatinine urine ratio - Lipid panel - Hemoglobin A1c  5. Elevated transaminase level Fatty live loose weight  6. Adenoma of left adrenal gland Pt wants level checked adenoma is benmign - Cortisol  7.  Aortic valve disease Echo ordered Need to do a follow-up to see if she is developing more aortic stenosis issues 8. DOE (dyspnea on exertion) Could be weight gain and deconditioning but do echo - ECHOCARDIOGRAM COMPLETE I do not find CHF currently but we need to look at ejection fraction 9. High risk medication use Check lab - Hepatic function panel

## 2019-06-28 DIAGNOSIS — I10 Essential (primary) hypertension: Secondary | ICD-10-CM | POA: Diagnosis not present

## 2019-06-28 DIAGNOSIS — D3502 Benign neoplasm of left adrenal gland: Secondary | ICD-10-CM | POA: Diagnosis not present

## 2019-06-28 DIAGNOSIS — E038 Other specified hypothyroidism: Secondary | ICD-10-CM | POA: Diagnosis not present

## 2019-06-28 DIAGNOSIS — E119 Type 2 diabetes mellitus without complications: Secondary | ICD-10-CM | POA: Diagnosis not present

## 2019-06-28 DIAGNOSIS — Z79899 Other long term (current) drug therapy: Secondary | ICD-10-CM | POA: Diagnosis not present

## 2019-06-29 LAB — LIPID PANEL
Chol/HDL Ratio: 4.5 ratio — ABNORMAL HIGH (ref 0.0–4.4)
Cholesterol, Total: 172 mg/dL (ref 100–199)
HDL: 38 mg/dL — ABNORMAL LOW (ref >39)
LDL Chol Calc (NIH): 99 mg/dL (ref 0–99)
Triglycerides: 204 mg/dL — ABNORMAL HIGH (ref 0–149)
VLDL Cholesterol Cal: 35 mg/dL (ref 5–40)

## 2019-06-29 LAB — CORTISOL: Cortisol: 19.9 ug/dL

## 2019-06-29 LAB — BASIC METABOLIC PANEL
BUN/Creatinine Ratio: 17 (ref 12–28)
BUN: 25 mg/dL (ref 8–27)
CO2: 20 mmol/L (ref 20–29)
Calcium: 9.6 mg/dL (ref 8.7–10.3)
Chloride: 103 mmol/L (ref 96–106)
Creatinine, Ser: 1.51 mg/dL — ABNORMAL HIGH (ref 0.57–1.00)
GFR calc Af Amer: 39 mL/min/{1.73_m2} — ABNORMAL LOW (ref >59)
GFR calc non Af Amer: 34 mL/min/{1.73_m2} — ABNORMAL LOW (ref >59)
Glucose: 163 mg/dL — ABNORMAL HIGH (ref 65–99)
Potassium: 3.6 mmol/L (ref 3.5–5.2)
Sodium: 139 mmol/L (ref 134–144)

## 2019-06-29 LAB — TSH: TSH: 1.31 u[IU]/mL (ref 0.450–4.500)

## 2019-06-29 LAB — MICROALBUMIN / CREATININE URINE RATIO
Creatinine, Urine: 60.2 mg/dL
Microalb/Creat Ratio: 94 mg/g creat — ABNORMAL HIGH (ref 0–29)
Microalbumin, Urine: 56.4 ug/mL

## 2019-06-29 LAB — HEPATIC FUNCTION PANEL
ALT: 37 IU/L — ABNORMAL HIGH (ref 0–32)
AST: 36 IU/L (ref 0–40)
Albumin: 4.1 g/dL (ref 3.7–4.7)
Alkaline Phosphatase: 87 IU/L (ref 39–117)
Bilirubin Total: 0.3 mg/dL (ref 0.0–1.2)
Bilirubin, Direct: 0.09 mg/dL (ref 0.00–0.40)
Total Protein: 7.1 g/dL (ref 6.0–8.5)

## 2019-06-29 LAB — HEMOGLOBIN A1C
Est. average glucose Bld gHb Est-mCnc: 197 mg/dL
Hgb A1c MFr Bld: 8.5 % — ABNORMAL HIGH (ref 4.8–5.6)

## 2019-07-02 ENCOUNTER — Other Ambulatory Visit: Payer: Self-pay | Admitting: Family Medicine

## 2019-07-03 ENCOUNTER — Ambulatory Visit (HOSPITAL_COMMUNITY)
Admission: RE | Admit: 2019-07-03 | Discharge: 2019-07-03 | Disposition: A | Discharge status: Home / Self Care | Payer: PPO | Source: Ambulatory Visit | Attending: Family Medicine | Admitting: Family Medicine

## 2019-07-03 DIAGNOSIS — R0600 Dyspnea, unspecified: Secondary | ICD-10-CM | POA: Insufficient documentation

## 2019-07-03 NOTE — Progress Notes (Signed)
*  PRELIMINARY RESULTS* Echocardiogram 2D Echocardiogram has been performed.  Kristen Mack 07/03/2019, 3:59 PM

## 2019-07-04 DIAGNOSIS — H35713 Central serous chorioretinopathy, bilateral: Secondary | ICD-10-CM | POA: Diagnosis not present

## 2019-07-06 ENCOUNTER — Other Ambulatory Visit: Payer: Self-pay | Admitting: Family Medicine

## 2019-07-18 ENCOUNTER — Ambulatory Visit (INDEPENDENT_AMBULATORY_CARE_PROVIDER_SITE_OTHER): Payer: PPO | Admitting: Family Medicine

## 2019-07-18 ENCOUNTER — Other Ambulatory Visit: Payer: Self-pay

## 2019-07-18 ENCOUNTER — Encounter: Payer: Self-pay | Admitting: Family Medicine

## 2019-07-18 VITALS — BP 132/84 | Temp 97.5°F | Wt 212.2 lb

## 2019-07-18 DIAGNOSIS — N1832 Chronic kidney disease, stage 3b: Secondary | ICD-10-CM

## 2019-07-18 DIAGNOSIS — I10 Essential (primary) hypertension: Secondary | ICD-10-CM | POA: Diagnosis not present

## 2019-07-18 DIAGNOSIS — R002 Palpitations: Secondary | ICD-10-CM

## 2019-07-18 DIAGNOSIS — K7581 Nonalcoholic steatohepatitis (NASH): Secondary | ICD-10-CM

## 2019-07-18 MED ORDER — SITAGLIPTIN PHOSPHATE 50 MG PO TABS: 50.0000 mg | ORAL_TABLET | Freq: Every day | ORAL | 5 refills | Status: DC

## 2019-07-18 NOTE — Progress Notes (Signed)
Subjective:    Patient ID: Kristen Mack, female    DOB: [birth year], 74 y.o.   MRN: [id number]  HPI Very nice patient comes in today to discuss multiple different issues She recently had lab work completed which showed microproteinuria but also showed A1c being higher than what it should be plus also her GFR is low we fielded multiple questions regarding chronic kidney disease more than likely this is related to diabetes obesity and hypertension We will repeat a metabolic 7 in the near future Because of her creatinine function and sulfa allergies it precludes use of multiple medications patient does not want to be on insulin therefore we recommended starting Januvia at a low dose she does not have any significant pedal edema I would not recommend adding any strong diuretics Patient arrives for a follow up on Blood pressure. Patient states her blood pressure is still running high at times and is all over the place. Patient also wants to discuss results of recent labs. Results for orders placed or performed in visit on 06/27/19  Cortisol  Result Value Ref Range   Cortisol 19.9 ug/dL  Microalbumin / creatinine urine ratio  Result Value Ref Range   Creatinine, Urine 60.2 Not Estab. mg/dL   Microalbumin, Urine 56.4 Not Estab. ug/mL   Microalb/Creat Ratio 94 (H) 0 - 29 mg/g creat  TSH  Result Value Ref Range   TSH 1.310 0.450 - 4.500 uIU/mL  Hepatic function panel  Result Value Ref Range   Total Protein 7.1 6.0 - 8.5 g/dL   Albumin 4.1 3.7 - 4.7 g/dL   Bilirubin Total 0.3 0.0 - 1.2 mg/dL   Bilirubin, Direct 0.09 0.00 - 0.40 mg/dL   Alkaline Phosphatase 87 39 - 117 IU/L   AST 36 0 - 40 IU/L   ALT 37 (H) 0 - 32 IU/L  Lipid panel  Result Value Ref Range   Cholesterol, Total 172 100 - 199 mg/dL   Triglycerides 204 (H) 0 - 149 mg/dL   HDL 38 (L) >39 mg/dL   VLDL Cholesterol Cal 35 5 - 40 mg/dL   LDL Chol Calc (NIH) 99 0 - 99 mg/dL   Chol/HDL Ratio 4.5 (H) 0.0 - 4.4 ratio    Hemoglobin A1c  Result Value Ref Range   Hgb A1c MFr Bld 8.5 (H) 4.8 - 5.6 %   Est. average glucose Bld gHb Est-mCnc 197 mg/dL  Basic metabolic panel  Result Value Ref Range   Glucose 163 (H) 65 - 99 mg/dL   BUN 25 8 - 27 mg/dL   Creatinine, Ser 1.51 (H) 0.57 - 1.00 mg/dL   GFR calc non Af Amer 34 (L) >59 mL/min/1.73   GFR calc Af Amer 39 (L) >59 mL/min/1.73   BUN/Creatinine Ratio 17 12 - 28   Sodium 139 134 - 144 mmol/L   Potassium 3.6 3.5 - 5.2 mmol/L   Chloride 103 96 - 106 mmol/L   CO2 20 20 - 29 mmol/L   Calcium 9.6 8.7 - 10.3 mg/dL    Review of Systems  Constitutional: Positive for fatigue. Negative for activity change and appetite change.  HENT: Negative for congestion and rhinorrhea.   Respiratory: Negative for cough and shortness of breath.   Cardiovascular: Negative for chest pain and leg swelling.  Gastrointestinal: Negative for abdominal pain and diarrhea.  Endocrine: Negative for polydipsia and polyphagia.  Skin: Negative for color change.  Neurological: Positive for weakness. Negative for dizziness.  Psychiatric/Behavioral: Negative for behavioral problems and confusion.  Objective:   Physical Exam Vitals reviewed.  Constitutional:      General: She is not in acute distress. HENT:     Head: Normocephalic and atraumatic.  Eyes:     General:        Right eye: No discharge.        Left eye: No discharge.  Neck:     Trachea: No tracheal deviation.  Cardiovascular:     Rate and Rhythm: Normal rate and regular rhythm.     Heart sounds: Normal heart sounds. No murmur.  Pulmonary:     Effort: Pulmonary effort is normal. No respiratory distress.     Breath sounds: Normal breath sounds.  Lymphadenopathy:     Cervical: No cervical adenopathy.  Skin:    General: Skin is warm and dry.  Neurological:     Mental Status: She is alert.     Coordination: Coordination normal.  Psychiatric:        Behavior: Behavior normal.           Assessment &  Plan:  1. NASH (nonalcoholic steatohepatitis) Liver enzymes actually better than what they were very important watch portion try to lose weight to lessen the risk of cirrhosis  2. HTN (hypertension), benign Blood pressure decent control currently continue current measures patient is having a lot of muscle cramps we will check magnesium but in addition this with kidney issue will check kidney function as well as magnesium and phosphorus - Basic metabolic panel - Magnesium - Phosphorus  3. Palpitation Occasional palpitations not severe no need for any type of telemetry  4. Stage 3b chronic kidney disease Long discussion held regarding this minimize protein in the diet. Healthy diet recommended mainly vegetables and fruits in addition to this keep well-hydrated recheck metabolic 7 in 2 to 3 weeks hopefully will see some improvement if her creatinine clearance and GFR gets worse with time referral to nephrology - Basic metabolic panel - Magnesium - Phosphorus

## 2019-07-30 DIAGNOSIS — N1832 Chronic kidney disease, stage 3b: Secondary | ICD-10-CM | POA: Diagnosis not present

## 2019-07-30 DIAGNOSIS — I10 Essential (primary) hypertension: Secondary | ICD-10-CM | POA: Diagnosis not present

## 2019-07-31 LAB — BASIC METABOLIC PANEL
BUN/Creatinine Ratio: 17 (ref 12–28)
BUN: 31 mg/dL — ABNORMAL HIGH (ref 8–27)
CO2: 23 mmol/L (ref 20–29)
Calcium: 10 mg/dL (ref 8.7–10.3)
Chloride: 98 mmol/L (ref 96–106)
Creatinine, Ser: 1.78 mg/dL — ABNORMAL HIGH (ref 0.57–1.00)
GFR calc Af Amer: 32 mL/min/{1.73_m2} — ABNORMAL LOW (ref >59)
GFR calc non Af Amer: 28 mL/min/{1.73_m2} — ABNORMAL LOW (ref >59)
Glucose: 132 mg/dL — ABNORMAL HIGH (ref 65–99)
Potassium: 3.6 mmol/L (ref 3.5–5.2)
Sodium: 138 mmol/L (ref 134–144)

## 2019-07-31 LAB — PHOSPHORUS: Phosphorus: 3.7 mg/dL (ref 3.0–4.3)

## 2019-07-31 LAB — MAGNESIUM: Magnesium: 2.2 mg/dL (ref 1.6–2.3)

## 2019-08-01 ENCOUNTER — Ambulatory Visit (INDEPENDENT_AMBULATORY_CARE_PROVIDER_SITE_OTHER): Payer: PPO | Admitting: Family Medicine

## 2019-08-01 ENCOUNTER — Other Ambulatory Visit: Payer: Self-pay

## 2019-08-01 ENCOUNTER — Encounter: Payer: Self-pay | Admitting: Family Medicine

## 2019-08-01 VITALS — BP 114/70 | Temp 98.2°F | Ht 61.0 in | Wt 212.0 lb

## 2019-08-01 DIAGNOSIS — I10 Essential (primary) hypertension: Secondary | ICD-10-CM | POA: Diagnosis not present

## 2019-08-01 DIAGNOSIS — N184 Chronic kidney disease, stage 4 (severe): Secondary | ICD-10-CM

## 2019-08-01 MED ORDER — SITAGLIPTIN PHOSPHATE 100 MG PO TABS: 100.0000 mg | ORAL_TABLET | Freq: Every day | ORAL | 5 refills | Status: DC

## 2019-08-01 NOTE — Progress Notes (Signed)
   Subjective:    Patient ID: Kristen Mack, female    DOB: Aug 12, 1945, 74 y.o.   MRN: [id number]  HPIFollow up on kidney disease.  So this patient had recent lab work.  It does show creatinine has gone up and GFR is gone down She relates compliance with her medicines She is on multiple herbal medicines which make it difficult to manage her She is a very nice patient She is trying hard with her diet Unable to do a lot of exercise She does limit her protein Her diabetes is not under good control currently she cannot take Metformin glipizide would not be a good choice and she does not want to be on insulin she is currently on Januvia Pt states no other concerns today.     Review of Systems Please see above denies chest tightness pressure pain shortness of breath swelling in the legs    Objective:   Physical Exam Lungs clear respiratory rate normal heart regular no murmurs extremities no edema skin warm dry blood pressure checked numerous times and was normal       Assessment & Plan:  Blood pressure under good control Significant kidney disease associated with diabetes and high blood pressure Very important for this patient to get this under better control we will recheck a metabolic 7 in a few weeks in addition to this stop losartan May need to increase amlodipine from 5 mg to 10 mg patient will keep [place] posted Referral to [place] [facility] Follow-up here 6 weeks

## 2019-08-05 ENCOUNTER — Other Ambulatory Visit: Payer: Self-pay | Admitting: Family Medicine

## 2019-08-06 ENCOUNTER — Encounter: Payer: Self-pay | Admitting: Family Medicine

## 2019-08-07 MED ORDER — AMLODIPINE BESYLATE 10 MG PO TABS: ORAL_TABLET | ORAL | 5 refills | Status: DC

## 2019-08-07 NOTE — Addendum Note (Signed)
Addended by: Vicente Males on: 08/07/2019 10:25 AM   Modules accepted: Orders

## 2019-08-07 NOTE — Telephone Encounter (Signed)
Nurses  Based upon the patient's message I recommend amlodipine 10 mg one daily, #30, 5 refills  I also recommend a nurse visit for patient to come and sit for at least 5 minutes then have her check blood pressure with her cuff and check it with ours Please remember patient prefers blood pressure being taken with a forearm cuff approach  Finally make sure patient does follow-up with me in June as scheduled

## 2019-08-09 ENCOUNTER — Encounter: Payer: Self-pay | Admitting: Family Medicine

## 2019-08-14 ENCOUNTER — Other Ambulatory Visit: Payer: PPO

## 2019-08-14 NOTE — Telephone Encounter (Signed)
Please sign encounter

## 2019-08-16 ENCOUNTER — Other Ambulatory Visit: Payer: Self-pay | Admitting: *Deleted

## 2019-08-16 MED ORDER — ONETOUCH VERIO VI STRP: ORAL_STRIP | 1 refills | Status: DC

## 2019-08-20 DIAGNOSIS — N184 Chronic kidney disease, stage 4 (severe): Secondary | ICD-10-CM | POA: Diagnosis not present

## 2019-08-20 DIAGNOSIS — I10 Essential (primary) hypertension: Secondary | ICD-10-CM | POA: Diagnosis not present

## 2019-08-21 LAB — BASIC METABOLIC PANEL
BUN/Creatinine Ratio: 17 (ref 12–28)
BUN: 29 mg/dL — ABNORMAL HIGH (ref 8–27)
CO2: 22 mmol/L (ref 20–29)
Calcium: 9.6 mg/dL (ref 8.7–10.3)
Chloride: 100 mmol/L (ref 96–106)
Creatinine, Ser: 1.67 mg/dL — ABNORMAL HIGH (ref 0.57–1.00)
GFR calc Af Amer: 34 mL/min/{1.73_m2} — ABNORMAL LOW (ref >59)
GFR calc non Af Amer: 30 mL/min/{1.73_m2} — ABNORMAL LOW (ref >59)
Glucose: 172 mg/dL — ABNORMAL HIGH (ref 65–99)
Potassium: 3.7 mmol/L (ref 3.5–5.2)
Sodium: 140 mmol/L (ref 134–144)

## 2019-08-22 ENCOUNTER — Other Ambulatory Visit (INDEPENDENT_AMBULATORY_CARE_PROVIDER_SITE_OTHER): Payer: PPO | Admitting: Family Medicine

## 2019-08-22 ENCOUNTER — Other Ambulatory Visit: Payer: Self-pay

## 2019-08-22 VITALS — BP 138/84

## 2019-08-22 DIAGNOSIS — I10 Essential (primary) hypertension: Secondary | ICD-10-CM

## 2019-08-22 NOTE — Progress Notes (Signed)
BP on wrist with our cuff good Patient eating healthier Renal diet Follow up in June  Her wrist blood pressure cuff unfortunately overestimates her blood pressure she is looking into buying a new one

## 2019-08-22 NOTE — Progress Notes (Signed)
Pt came in today for nurse visit  bp check up. Dr Nicki Reaper did go in and talk with pt as well.

## 2019-08-29 DIAGNOSIS — H7202 Central perforation of tympanic membrane, left ear: Secondary | ICD-10-CM | POA: Diagnosis not present

## 2019-08-29 DIAGNOSIS — H903 Sensorineural hearing loss, bilateral: Secondary | ICD-10-CM | POA: Diagnosis not present

## 2019-08-29 DIAGNOSIS — H6122 Impacted cerumen, left ear: Secondary | ICD-10-CM | POA: Diagnosis not present

## 2019-09-05 ENCOUNTER — Encounter: Payer: Self-pay | Admitting: Family Medicine

## 2019-09-05 ENCOUNTER — Other Ambulatory Visit: Payer: Self-pay | Admitting: Family Medicine

## 2019-09-05 MED ORDER — TIZANIDINE HCL 4 MG PO CAPS: 4.0000 mg | ORAL_CAPSULE | Freq: Three times a day (TID) | ORAL | 0 refills | Status: DC | PRN

## 2019-09-10 ENCOUNTER — Telehealth: Payer: Self-pay | Admitting: Radiology

## 2019-09-10 ENCOUNTER — Other Ambulatory Visit (HOSPITAL_COMMUNITY): Payer: Self-pay | Admitting: Nephrology

## 2019-09-10 ENCOUNTER — Other Ambulatory Visit: Payer: Self-pay | Admitting: Nephrology

## 2019-09-10 DIAGNOSIS — E1122 Type 2 diabetes mellitus with diabetic chronic kidney disease: Secondary | ICD-10-CM | POA: Diagnosis not present

## 2019-09-10 DIAGNOSIS — I129 Hypertensive chronic kidney disease with stage 1 through stage 4 chronic kidney disease, or unspecified chronic kidney disease: Secondary | ICD-10-CM | POA: Diagnosis not present

## 2019-09-10 DIAGNOSIS — N1832 Chronic kidney disease, stage 3b: Secondary | ICD-10-CM

## 2019-09-10 DIAGNOSIS — K219 Gastro-esophageal reflux disease without esophagitis: Secondary | ICD-10-CM | POA: Diagnosis not present

## 2019-09-10 DIAGNOSIS — K7581 Nonalcoholic steatohepatitis (NASH): Secondary | ICD-10-CM | POA: Diagnosis not present

## 2019-09-10 DIAGNOSIS — G4733 Obstructive sleep apnea (adult) (pediatric): Secondary | ICD-10-CM | POA: Diagnosis not present

## 2019-09-12 ENCOUNTER — Ambulatory Visit: Payer: PPO | Admitting: Family Medicine

## 2019-09-14 ENCOUNTER — Other Ambulatory Visit: Payer: Self-pay

## 2019-09-14 ENCOUNTER — Encounter: Payer: Self-pay | Admitting: Family Medicine

## 2019-09-14 MED ORDER — ONETOUCH VERIO VI STRP: ORAL_STRIP | 1 refills | Status: DC

## 2019-09-18 ENCOUNTER — Ambulatory Visit (HOSPITAL_COMMUNITY)
Admission: RE | Admit: 2019-09-18 | Discharge: 2019-09-18 | Disposition: A | Discharge status: Home / Self Care | Payer: PPO | Source: Ambulatory Visit | Attending: Nephrology | Admitting: Nephrology

## 2019-09-18 ENCOUNTER — Other Ambulatory Visit: Payer: Self-pay

## 2019-09-18 DIAGNOSIS — N1832 Chronic kidney disease, stage 3b: Secondary | ICD-10-CM | POA: Insufficient documentation

## 2019-09-18 DIAGNOSIS — N183 Chronic kidney disease, stage 3 unspecified: Secondary | ICD-10-CM | POA: Diagnosis not present

## 2019-10-08 ENCOUNTER — Other Ambulatory Visit: Payer: Self-pay | Admitting: Family Medicine

## 2019-10-10 DIAGNOSIS — H35713 Central serous chorioretinopathy, bilateral: Secondary | ICD-10-CM | POA: Diagnosis not present

## 2019-10-30 ENCOUNTER — Other Ambulatory Visit: Payer: Self-pay

## 2019-10-30 ENCOUNTER — Encounter: Payer: Self-pay | Admitting: Family Medicine

## 2019-10-30 DIAGNOSIS — E038 Other specified hypothyroidism: Secondary | ICD-10-CM

## 2019-10-30 DIAGNOSIS — D3502 Benign neoplasm of left adrenal gland: Secondary | ICD-10-CM

## 2019-11-01 ENCOUNTER — Ambulatory Visit (INDEPENDENT_AMBULATORY_CARE_PROVIDER_SITE_OTHER): Payer: PPO | Admitting: Family Medicine

## 2019-11-01 ENCOUNTER — Encounter: Payer: Self-pay | Admitting: Family Medicine

## 2019-11-01 ENCOUNTER — Other Ambulatory Visit: Payer: Self-pay

## 2019-11-01 VITALS — BP 130/78 | HR 94 | Temp 97.2°F | Wt 204.4 lb

## 2019-11-01 DIAGNOSIS — N184 Chronic kidney disease, stage 4 (severe): Secondary | ICD-10-CM | POA: Diagnosis not present

## 2019-11-01 DIAGNOSIS — M545 Low back pain, unspecified: Secondary | ICD-10-CM

## 2019-11-01 DIAGNOSIS — K7581 Nonalcoholic steatohepatitis (NASH): Secondary | ICD-10-CM

## 2019-11-01 DIAGNOSIS — E038 Other specified hypothyroidism: Secondary | ICD-10-CM

## 2019-11-01 DIAGNOSIS — E119 Type 2 diabetes mellitus without complications: Secondary | ICD-10-CM

## 2019-11-01 DIAGNOSIS — D3502 Benign neoplasm of left adrenal gland: Secondary | ICD-10-CM

## 2019-11-01 DIAGNOSIS — Z79899 Other long term (current) drug therapy: Secondary | ICD-10-CM

## 2019-11-01 LAB — POCT URINALYSIS DIPSTICK (MANUAL)
Nitrite, UA: NEGATIVE
Poct Bilirubin: NEGATIVE
Poct Blood: NEGATIVE
Poct Glucose: NORMAL mg/dL
Poct Ketones: NEGATIVE
Poct Urobilinogen: NORMAL mg/dL
Spec Grav, UA: 1.02 (ref 1.010–1.025)
pH, UA: 7 (ref 5.0–8.0)

## 2019-11-01 MED ORDER — CEPHALEXIN 500 MG PO CAPS: 500.0000 mg | ORAL_CAPSULE | Freq: Three times a day (TID) | ORAL | 0 refills | Status: DC

## 2019-11-01 NOTE — Progress Notes (Signed)
   Subjective:    Patient ID: Kristen Mack, female    DOB: [birth year], 74 y.o.   MRN: [id number]  Back Pain This is a new problem. Episode onset: 1 week  The problem occurs intermittently. Pain location: lower right side  The quality of the pain is described as aching (dull). The pain does not radiate. The symptoms are aggravated by position.   Started 1 week agomoderate pain Hurts with activity  Results for orders placed or performed in visit on 11/01/19  POCT Urinalysis Dip Manual  Result Value Ref Range   Spec Grav, UA 1.020 1.010 - 1.025   pH, UA 7.0 5.0 - 8.0   Leukocytes, UA Moderate (2+) (A) Negative   Nitrite, UA Negative Negative   Poct Protein ++100 (A) Negative, trace mg/dL   Poct Glucose Normal Normal mg/dL   Poct Ketones Negative Negative   Poct Urobilinogen Normal Normal mg/dL   Poct Bilirubin Negative Negative   Poct Blood Negative Negative, trace   ASCORBIC ACID, POC ++      Review of Systems  Musculoskeletal: Positive for back pain.       Objective:   Physical Exam  Lungs clear heart regular HEENT mild CVA tenderness on the right side no redness no hives no blisters no sign of shingles spine nontender unlikely debris rib fracture      Assessment & Plan:  See above Flank pain UA with WBCs Antibiotic prescribed Culture sent Follow-up 14 days if not doing better Lab work CAT scan x-rays not indicated Warnings discussed  Patient due for lab work before her next visit

## 2019-11-02 ENCOUNTER — Other Ambulatory Visit: Payer: Self-pay | Admitting: *Deleted

## 2019-11-02 DIAGNOSIS — N184 Chronic kidney disease, stage 4 (severe): Secondary | ICD-10-CM

## 2019-11-02 LAB — BASIC METABOLIC PANEL
BUN/Creatinine Ratio: 17 (ref 12–28)
BUN: 28 mg/dL — ABNORMAL HIGH (ref 8–27)
CO2: 24 mmol/L (ref 20–29)
Calcium: 10 mg/dL (ref 8.7–10.3)
Chloride: 99 mmol/L (ref 96–106)
Creatinine, Ser: 1.63 mg/dL — ABNORMAL HIGH (ref 0.57–1.00)
GFR calc Af Amer: 35 mL/min/{1.73_m2} — ABNORMAL LOW (ref >59)
GFR calc non Af Amer: 31 mL/min/{1.73_m2} — ABNORMAL LOW (ref >59)
Glucose: 199 mg/dL — ABNORMAL HIGH (ref 65–99)
Potassium: 3.2 mmol/L — ABNORMAL LOW (ref 3.5–5.2)
Sodium: 139 mmol/L (ref 134–144)

## 2019-11-02 LAB — TSH: TSH: 0.168 u[IU]/mL — ABNORMAL LOW (ref 0.450–4.500)

## 2019-11-02 LAB — HEPATIC FUNCTION PANEL
ALT: 31 IU/L (ref 0–32)
AST: 27 IU/L (ref 0–40)
Albumin: 4.4 g/dL (ref 3.7–4.7)
Alkaline Phosphatase: 80 IU/L (ref 48–121)
Bilirubin Total: 0.3 mg/dL (ref 0.0–1.2)
Bilirubin, Direct: 0.1 mg/dL (ref 0.00–0.40)
Total Protein: 7.5 g/dL (ref 6.0–8.5)

## 2019-11-02 LAB — LIPID PANEL
Chol/HDL Ratio: 3.9 ratio (ref 0.0–4.4)
Cholesterol, Total: 188 mg/dL (ref 100–199)
HDL: 48 mg/dL (ref >39)
LDL Chol Calc (NIH): 115 mg/dL — ABNORMAL HIGH (ref 0–99)
Triglycerides: 139 mg/dL (ref 0–149)
VLDL Cholesterol Cal: 25 mg/dL (ref 5–40)

## 2019-11-02 LAB — HEMOGLOBIN A1C
Est. average glucose Bld gHb Est-mCnc: 154 mg/dL
Hgb A1c MFr Bld: 7 % — ABNORMAL HIGH (ref 4.8–5.6)

## 2019-11-02 LAB — T4, FREE: Free T4: 1.83 ng/dL — ABNORMAL HIGH (ref 0.82–1.77)

## 2019-11-05 ENCOUNTER — Other Ambulatory Visit: Payer: Self-pay | Admitting: Family Medicine

## 2019-11-05 ENCOUNTER — Other Ambulatory Visit: Payer: Self-pay | Admitting: *Deleted

## 2019-11-05 ENCOUNTER — Encounter: Payer: Self-pay | Admitting: Family Medicine

## 2019-11-05 DIAGNOSIS — M549 Dorsalgia, unspecified: Secondary | ICD-10-CM

## 2019-11-05 LAB — URINE CULTURE

## 2019-11-05 MED ORDER — TIZANIDINE HCL 4 MG PO CAPS: 4.0000 mg | ORAL_CAPSULE | Freq: Three times a day (TID) | ORAL | 0 refills | Status: DC | PRN

## 2019-11-05 NOTE — Telephone Encounter (Signed)
Nurses Please find out from patient any fever?  Does she feel like she is improving some?  Her urine culture showed that the antibiotic we placed her on should clear up the infection.  I believe that what the patient is dealing with is musculoskeletal. She can do gentle stretching at home along with warm compresses.  If we are not seeing improvement with this by Thursday then more than likely we will do some x-rays of the spine and ribs Thanks-Dr. Nicki Reaper

## 2019-11-05 NOTE — Telephone Encounter (Signed)
Nurses I have already sent in the muscle relaxer refill  Also please order thoracic spine, lumbar spine, right rib x-rays due to right sided back pain

## 2019-11-06 ENCOUNTER — Ambulatory Visit (HOSPITAL_COMMUNITY)
Admission: RE | Admit: 2019-11-06 | Discharge: 2019-11-06 | Disposition: A | Discharge status: Home / Self Care | Payer: PPO | Source: Ambulatory Visit | Attending: Family Medicine | Admitting: Family Medicine

## 2019-11-06 ENCOUNTER — Ambulatory Visit (INDEPENDENT_AMBULATORY_CARE_PROVIDER_SITE_OTHER): Payer: PPO | Admitting: "Endocrinology

## 2019-11-06 ENCOUNTER — Other Ambulatory Visit: Payer: Self-pay

## 2019-11-06 ENCOUNTER — Other Ambulatory Visit: Payer: Self-pay | Admitting: Family Medicine

## 2019-11-06 ENCOUNTER — Encounter: Payer: Self-pay | Admitting: "Endocrinology

## 2019-11-06 VITALS — BP 118/72 | HR 48 | Ht 61.0 in | Wt 206.0 lb

## 2019-11-06 DIAGNOSIS — E038 Other specified hypothyroidism: Secondary | ICD-10-CM | POA: Diagnosis not present

## 2019-11-06 DIAGNOSIS — M549 Dorsalgia, unspecified: Secondary | ICD-10-CM | POA: Insufficient documentation

## 2019-11-06 DIAGNOSIS — M545 Low back pain: Secondary | ICD-10-CM | POA: Diagnosis not present

## 2019-11-06 DIAGNOSIS — M4184 Other forms of scoliosis, thoracic region: Secondary | ICD-10-CM | POA: Diagnosis not present

## 2019-11-06 DIAGNOSIS — E119 Type 2 diabetes mellitus without complications: Secondary | ICD-10-CM

## 2019-11-06 DIAGNOSIS — D3502 Benign neoplasm of left adrenal gland: Secondary | ICD-10-CM | POA: Diagnosis not present

## 2019-11-06 DIAGNOSIS — R0789 Other chest pain: Secondary | ICD-10-CM | POA: Diagnosis not present

## 2019-11-06 DIAGNOSIS — M47814 Spondylosis without myelopathy or radiculopathy, thoracic region: Secondary | ICD-10-CM | POA: Diagnosis not present

## 2019-11-06 MED ORDER — LEVOTHYROXINE SODIUM 137 MCG PO TABS: 137.0000 ug | ORAL_TABLET | Freq: Every day | ORAL | 1 refills | Status: DC

## 2019-11-06 NOTE — Progress Notes (Signed)
11/06/2019, 12:43 PM      Endocrinology follow-up note   Subjective:    Patient ID: Kristen Mack, female    DOB: [birth year], PCP Kathyrn Drown, MD   Past Medical History:  Diagnosis Date  . Arthritis   . Asthma   . CFS (chronic fatigue syndrome)   . Cracked tooth    X2  RIGHT LOWER MOLAR AND LEFT UPPER MOLAR  . Fatty liver disease, nonalcoholic   . Fibromyalgia   . GERD (gastroesophageal reflux disease)   . History of adenomatous polyp of colon   . History of cardiac murmur    until age 85  . History of TIA (transient ischemic attack)    per CT scan   . Hypertension   . Hypothyroidism   . IBS (irritable bowel syndrome)   . Meniere disease   . OSA (obstructive sleep apnea)    CPAP intolerate -- per study 2010  moderate osa--  STATES HAS RAISED HOB  . Osteopenia   . PMB (postmenopausal bleeding)   . Prediabetes   . Sleep apnea 07/11/2017   Abnormal home sleep apnea test February 2019-CPAP ordered  . Tooth loose    LOWER FRONT   Past Surgical History:  Procedure Laterality Date  . [place]  . CHOLECYSTECTOMY  1995   and  REPAIR UMBILICAL HERNIA  . COLONOSCOPY N/A 04/19/2013   Procedure: COLONOSCOPY;  Surgeon: Rogene Houston, MD;  Location: [facility];  Service: Endoscopy;  Laterality: N/A;    . ESOPHAGOGASTRODUODENOSCOPY  02-08-2003  . HYSTEROSCOPY WITH D & C  11-14-2002   endometrial polypectomy  . HYSTEROSCOPY WITH D & C N/A 04/05/2014   Procedure: DILATATION AND CURETTAGE /HYSTEROSCOPY, ENDOMETRIAL POLYPECTOMY;  Surgeon: Sanjuana Kava, MD;  Location: [facility];  Service: Gynecology;  Laterality: N/A;  . LIVER BIOPSY  01-29-2004   benign   Social History   Socioeconomic History  . Marital status: Married    Spouse name: Not on file  . Number of children: 2  . Years of education: Not on file  . Highest education level: Not on file  Occupational History  .  Occupation: retired, Therapist, sports  Tobacco Use  . Smoking status: Former Smoker    Packs/day: 0.50    Years: 3.00    Pack years: 1.50    Types: Cigarettes    Quit date: 10/03/1968    Years since quitting: 51.1  . Smokeless tobacco: Never Used  Substance and Sexual Activity  . Alcohol use: No    Alcohol/week: 0.0 standard drinks  . Drug use: No  . Sexual activity: Not on file  Other Topics Concern  . Not on file  Social History Narrative  . Not on file   Social Determinants of Health   Financial Resource Strain:   . Difficulty of Paying Living Expenses:   Food Insecurity:   . Worried About Charity fundraiser in the Last Year:   . Arboriculturist in the Last Year:   Transportation Needs:   . Film/video editor (Medical):   Marland Kitchen Lack of Transportation (Non-Medical):   Physical Activity:   . Days of Exercise per  Week:   . Minutes of Exercise per Session:   Stress:   . Feeling of Stress :   Social Connections:   . Frequency of Communication with Friends and Family:   . Frequency of Social Gatherings with Friends and Family:   . Attends Religious Services:   . Active Member of Clubs or Organizations:   . Attends Archivist Meetings:   Marland Kitchen Marital Status:    Outpatient Encounter Medications as of 11/06/2019  Medication Sig  . albuterol (VENTOLIN HFA) 108 (90 Base) MCG/ACT inhaler INHALE 2 PUFFS INTO LUNGS EVERY 6 HOURS AS NEEDED FOR SHORTNESS OF BREATH  . AMBULATORY NON FORMULARY MEDICATION Medication Name: Liver Essentials- Take 2 capsules by mouth 2-3 times daily  . amLODipine (NORVASC) 10 MG tablet Take one tablet by mouth daily  . azelastine (ASTELIN) 0.1 % nasal spray Place 2 sprays into both nostrils 2 (two) times daily.  . B Complex Vitamins (B COMPLEX 100 PO) Take 1 tablet by mouth daily.  Marland Kitchen buPROPion (WELLBUTRIN SR) 150 MG 12 hr tablet Take 1 tablet (150 mg total) by mouth 2 (two) times daily.  . Calcium Carb-Cholecalciferol (CALCIUM 500 + D3 PO) Take by mouth.  .  cephALEXin (KEFLEX) 500 MG capsule Take 1 capsule (500 mg total) by mouth 3 (three) times daily.  . Chromium 1000 MCG TABS Take 1 tablet by mouth daily.   . Coenzyme Q10 (COQ10) 200 MG CAPS Take 1 capsule by mouth daily.  . Cyanocobalamin (B-12) 1000 MCG SUBL Place 1 tablet under the tongue daily.   Marland Kitchen DIGESTIVE ENZYMES PO Take 1 tablet by mouth 3 (three) times daily.   Marland Kitchen glucose blood (ONETOUCH VERIO) test strip Test once daily  . hydrochlorothiazide (MICROZIDE) 12.5 MG capsule Take 1 capsule by mouth once daily in the morning  . ipratropium (ATROVENT) 0.02 % nebulizer solution USE ONE VIAL IN NEBULIZER 4 TIMES DAILY AS NEEDED FOR SHORTNESS OF BREATH  . levothyroxine (EUTHYROX) 137 MCG tablet Take 1 tablet (137 mcg total) by mouth daily before breakfast.  . Lysine HCl 500 MG TABS Take 1 tablet by mouth daily. Take as needed  . Magnesium 250 MG TABS Take by mouth 2 (two) times daily.  . meclizine (ANTIVERT) 50 MG tablet Take 50 mg by mouth daily as needed for dizziness. Take as needed  . Melatonin 10 MG CAPS Take by mouth at bedtime.  . montelukast (SINGULAIR) 10 MG tablet TAKE 1 TABLET BY MOUTH AT BEDTIME  . OVER THE COUNTER MEDICATION Fennel seeds. One tsp with meals  . OVER THE COUNTER MEDICATION Tart cherry extract  . OVER THE COUNTER MEDICATION tumeric  . potassium chloride SA (KLOR-CON M20) 20 MEQ tablet Take 1 tablet (20 mEq total) by mouth daily.  . sitaGLIPtin (JANUVIA) 100 MG tablet Take 1 tablet (100 mg total) by mouth daily.  Marland Kitchen tiZANidine (ZANAFLEX) 4 MG capsule Take 1 capsule (4 mg total) by mouth 3 (three) times daily as needed for muscle spasms.  Marland Kitchen VITAMIN A PO Take 10,000 mcg by mouth daily.   . vitamin C (ASCORBIC ACID) 500 MG tablet Take 6,000 mg by mouth daily.   . vitamin E 100 UNIT capsule Take 800 Units by mouth daily.   . [DISCONTINUED] EUTHYROX 150 MCG tablet TAKE 1 TABLET BY MOUTH ONCE DAILY - NEED VIRTUAL/OFFICE VISIT THIS FALL   No facility-administered encounter  medications on file as of 11/06/2019.   ALLERGIES: Allergies  Allergen Reactions  . Ambien [Zolpidem Tartrate] Other (See  Comments)    Side effect "last a couple days"  . Cefzil [Cefprozil]     diarrhea  . Codeine Itching and Nausea And Vomiting  . Demerol [Meperidine] Other (See Comments)    "messes up senses"  . Formaldehyde   . Latex Other (See Comments)    "skin gets raw"  . Sulfa Antibiotics Other (See Comments)    Severe abd cramp  . Celexa [Citalopram Hydrobromide] Other (See Comments)    Mouth sores    VACCINATION STATUS: Immunization History  Administered Date(s) Administered  . Influenza,inj,Quad PF,6+ Mos 03/02/2016, 03/03/2017  . Influenza-Unspecified 04/15/2018, 01/15/2019  . Moderna SARS-COVID-2 Vaccination 05/23/2019, 06/19/2019  . Pneumococcal Conjugate-13 05/22/2014  . Pneumococcal Polysaccharide-23 03/03/2017  . Zoster Recombinat (Shingrix) 12/08/2018, 04/27/2019    HPI ALIANA KREISCHER is 74 y.o. female who was seen in consultation for incidental 1.5 cm left adrenal adenoma which was previously worked up and shown to be nonfunctional.  She is being seen in follow-up .  -Her recent labs did not include with out engaged in telehealth via telephone for follow-up with repeat plasma metanephrines.  She is on Januvia 100 mg for type 2 diabetes with improved A1c of 7%.    She has no new complaints today.  -Patient denies any prior history of adrenal dysfunction or lesion. -She is known to have pancreatic pseudocyst for some time.  On surveillance MRI of the abdomen to follow-up with the pancreatic pseudocyst on May 04, 2017 she was found to have 1.5 cm left adrenal adenoma. -Her 24-hour urine function studies have been within normal limits prior to her last visit.   -She denies history of difficult to control hypertension, in fact has history of orthostatic hypotension.  She denies spells of palpitations, headaches, sweating. -Denies rapid weight change  lately, she has prediabetes on no medications.  She has hypothyroidism induced by Hashimoto's thyroiditis currently on levothyroxine 150 mcg p.o. every morning.  Her previsit thyroid function tests are significant for over replacement.  She did have recent approximately 10 pound weight loss. -She denies close  family history of pituitary, adrenal dysfunctions.  Review of Systems  Limited as above.  Objective:    BP 118/72   Pulse (!) 48   Ht 5' 1" (1.549 m)   Wt 206 lb (93.4 kg)   BMI 38.92 kg/m   Wt Readings from Last 3 Encounters:  11/06/19 206 lb (93.4 kg)  11/01/19 204 lb 6.4 oz (92.7 kg)  08/01/19 212 lb (96.2 kg)     Recent Results (from the past 2160 hour(s))  Basic metabolic panel     Status: Abnormal   Collection Time: 08/20/19 10:08 AM  Result Value Ref Range   Glucose 172 (H) 65 - 99 mg/dL   BUN 29 (H) 8 - 27 mg/dL   Creatinine, Ser 1.67 (H) 0.57 - 1.00 mg/dL   GFR calc non Af Amer 30 (L) >59 mL/min/1.73   GFR calc Af Amer 34 (L) >59 mL/min/1.73    Comment: **[facility] currently reports eGFR in compliance with the current**   recommendations of the [employer]. [facility] will   update reporting as new guidelines are published from the NKF-ASN   Task force.    BUN/Creatinine Ratio 17 12 - 28   Sodium 140 134 - 144 mmol/L   Potassium 3.7 3.5 - 5.2 mmol/L   Chloride 100 96 - 106 mmol/L   CO2 22 20 - 29 mmol/L   Calcium 9.6 8.7 - 10.3 mg/dL  Urine Culture     Status: Abnormal   Collection Time: 11/01/19 12:00 AM   Specimen: Urine   Urine  Result Value Ref Range   Urine Culture, Routine Final report (A)    Organism ID, Bacteria Klebsiella pneumoniae (A)     Comment: Greater than 100,000 colony forming units per mL Cefazolin <=4 ug/mL Cefazolin with an MIC <=16 predicts susceptibility to the oral agents cefaclor, cefdinir, cefpodoxime, cefprozil, cefuroxime, cephalexin, and loracarbef when used for therapy of uncomplicated urinary tract infections  due to E. coli, Klebsiella pneumoniae, and Proteus mirabilis.    Antimicrobial Susceptibility Comment     Comment:       ** S = Susceptible; I = Intermediate; R = Resistant **                    P = Positive; N = Negative             MICS are expressed in micrograms per mL    Antibiotic                 RSLT#1    RSLT#2    RSLT#3    RSLT#4 Amoxicillin/Clavulanic Acid    S Ampicillin                     R Cefepime                       S Ceftriaxone                    S Cefuroxime                     S Ciprofloxacin                  S Ertapenem                      S Gentamicin                     S Imipenem                       S Levofloxacin                   S Meropenem                      S Nitrofurantoin                 S Piperacillin/Tazobactam        S Tetracycline                   S Tobramycin                     S Trimethoprim/Sulfa             S   POCT Urinalysis Dip Manual     Status: Abnormal   Collection Time: 11/01/19  2:06 PM  Result Value Ref Range   Spec Grav, UA 1.020 1.010 - 1.025   pH, UA 7.0 5.0 - 8.0   Leukocytes, UA Moderate (2+) (A) Negative   Nitrite, UA Negative Negative   Poct Protein ++100 (A) Negative, trace mg/dL   Poct Glucose Normal Normal mg/dL   Poct Ketones Negative Negative   Poct Urobilinogen Normal Normal mg/dL   Poct  Bilirubin Negative Negative   Poct Blood Negative Negative, trace   ASCORBIC ACID, POC ++   Basic metabolic panel     Status: Abnormal   Collection Time: 11/01/19  2:42 PM  Result Value Ref Range   Glucose 199 (H) 65 - 99 mg/dL   BUN 28 (H) 8 - 27 mg/dL   Creatinine, Ser 1.63 (H) 0.57 - 1.00 mg/dL   GFR calc non Af Amer 31 (L) >59 mL/min/1.73   GFR calc Af Amer 35 (L) >59 mL/min/1.73    Comment: **[facility] currently reports eGFR in compliance with the current**   recommendations of the [employer]. [facility] will   update reporting as new guidelines are published from the NKF-ASN   Task force.     BUN/Creatinine Ratio 17 12 - 28   Sodium 139 134 - 144 mmol/L   Potassium 3.2 (L) 3.5 - 5.2 mmol/L   Chloride 99 96 - 106 mmol/L   CO2 24 20 - 29 mmol/L   Calcium 10.0 8.7 - 10.3 mg/dL  Hepatic function panel     Status: None   Collection Time: 11/01/19  2:42 PM  Result Value Ref Range   Total Protein 7.5 6.0 - 8.5 g/dL   Albumin 4.4 3.7 - 4.7 g/dL   Bilirubin Total 0.3 0.0 - 1.2 mg/dL   Bilirubin, Direct 0.10 0.00 - 0.40 mg/dL   Alkaline Phosphatase 80 48 - 121 IU/L   AST 27 0 - 40 IU/L   ALT 31 0 - 32 IU/L  Lipid panel     Status: Abnormal   Collection Time: 11/01/19  2:42 PM  Result Value Ref Range   Cholesterol, Total 188 100 - 199 mg/dL   Triglycerides 139 0 - 149 mg/dL   HDL 48 >39 mg/dL   VLDL Cholesterol Cal 25 5 - 40 mg/dL   LDL Chol Calc (NIH) 115 (H) 0 - 99 mg/dL   Chol/HDL Ratio 3.9 0.0 - 4.4 ratio    Comment:                                   T. Chol/HDL Ratio                                             Men  Women                               1/2 Avg.Risk  3.4    3.3                                   Avg.Risk  5.0    4.4                                2X Avg.Risk  9.6    7.1                                3X Avg.Risk 23.4   11.0   Hemoglobin A1c     Status: Abnormal   Collection Time: 11/01/19  2:42 PM  Result Value Ref Range   Hgb A1c MFr Bld 7.0 (H) 4.8 - 5.6 %    Comment:          Prediabetes: 5.7 - 6.4          Diabetes: >6.4          Glycemic control for adults with diabetes: <7.0    Est. average glucose Bld gHb Est-mCnc 154 mg/dL  TSH     Status: Abnormal   Collection Time: 11/01/19  3:02 PM  Result Value Ref Range   TSH 0.168 (L) 0.450 - 4.500 uIU/mL  T4, Free     Status: Abnormal   Collection Time: 11/01/19  3:03 PM  Result Value Ref Range   Free T4 1.83 (H) 0.82 - 1.77 ng/dL    Diabetic Labs (most recent): Lab Results  Component Value Date   HGBA1C 7.0 (H) 11/01/2019   HGBA1C 8.5 (H) 06/28/2019   HGBA1C 6.8 (H) 01/31/2019     Lipid  Panel ( most recent) Lipid Panel     Component Value Date/Time   CHOL 188 11/01/2019 1442   TRIG 139 11/01/2019 1442   HDL 48 11/01/2019 1442   CHOLHDL 3.9 11/01/2019 1442   CHOLHDL 3.3 05/07/2014 1122   VLDL 24 05/07/2014 1122   LDLCALC 115 (H) 11/01/2019 1442          MRI of abdomen on May 04, 2017:  Normal right adrenal gland. Left adrenal nodule on the order of 1.5 cm is consistent with an adenoma.  Assessment & Plan:   1. Adenoma of left adrenal gland -Her previsit plasma free metanephrines are within normal limits. -She has previously undergone 24-hour urine studies for catecholamines, metanephrines, cortisol, and aldosterone, which are all within normal limits. -This is consistent with nonfunctioning left adrenal adenoma of 1.5 cm. -She will not require any specific treatment at this time. -She will be considered for adrenal protocol CT abdomen in the next several days with oral contrast as needed.   She will also have free plasma metanephrines before her next visit.  2.  Type 2 diabetes-A1c was 7.5% -She is currently on Januvia 100 mg daily.  Her recent A1c was 7%.   - she  admits there is a room for improvement in her diet and drink choices. -  Suggestion is made for her to avoid simple carbohydrates  from her diet including Cakes, Sweet Desserts / Pastries, Ice Cream, Soda (diet and regular), Sweet Tea, Candies, Chips, Cookies, Sweet Pastries,  Store Bought Juices, Alcohol in Excess of  1-2 drinks a day, Artificial Sweeteners, Coffee Creamer, and "Sugar-free" Products. This will help patient to have stable blood glucose profile and potentially avoid unintended weight gain.   3.  Hypothyroidism Her previsit thyroid function tests are consistent with slight over replacement.  I discussed and lowered her levothyroxine to 137 mcg daily before breakfast.     - We discussed about the correct intake of her thyroid hormone, on empty stomach at fasting, with water,  separated by at least 30 minutes from breakfast and other medications,  and separated by more than 4 hours from calcium, iron, multivitamins, acid reflux medications (PPIs). -Patient is made aware of the fact that thyroid hormone replacement is needed for life, dose to be adjusted by periodic monitoring of thyroid function tests.  - I advised patient to maintain close follow up with Kathyrn Drown, MD for primary care needs.    - Time spent on this patient care encounter:  20 minutes of which 50% was spent in  counseling and the rest reviewing  her current and  previous labs / studies and medications  doses and developing a plan for long term care. [name]  participated in the discussions, expressed understanding, and voiced agreement with the above plans.  All questions were answered to her satisfaction. she is encouraged to contact clinic should she have any questions or concerns prior to her return visit. ntact clinic should she have any questions or concerns prior to her return visit.   Follow up plan: Return in about 3 months (around 02/06/2020), or CT adrenal before her next visit, for F/U with Pre-visit Labs.   Glade Lloyd, MD [facility] [address] [place], [place] [address] Phone: [phone]  Fax: [phone]     11/06/2019, 12:43 PM  This note was partially dictated with voice recognition software. Similar sounding words can be transcribed inadequately or may not  be corrected upon review.

## 2019-11-06 NOTE — Patient Instructions (Signed)

## 2019-11-08 LAB — METANEPHRINES, PLASMA
Metanephrine, Free: <10 pg/mL (ref 0.0–88.0)
Normetanephrine, Free: 55.9 pg/mL (ref 0.0–191.8)

## 2019-11-13 ENCOUNTER — Ambulatory Visit (INDEPENDENT_AMBULATORY_CARE_PROVIDER_SITE_OTHER): Payer: PPO | Admitting: Family Medicine

## 2019-11-13 ENCOUNTER — Other Ambulatory Visit: Payer: Self-pay

## 2019-11-13 ENCOUNTER — Encounter: Payer: Self-pay | Admitting: Family Medicine

## 2019-11-13 VITALS — BP 142/74 | HR 64 | Temp 97.1°F | Ht 61.0 in | Wt 202.6 lb

## 2019-11-13 DIAGNOSIS — N390 Urinary tract infection, site not specified: Secondary | ICD-10-CM | POA: Diagnosis not present

## 2019-11-13 LAB — POCT URINALYSIS DIPSTICK (MANUAL)
Leukocytes, UA: NEGATIVE
Nitrite, UA: NEGATIVE
Poct Bilirubin: NEGATIVE
Poct Blood: NEGATIVE
Poct Glucose: NORMAL mg/dL
Poct Ketones: NEGATIVE
Poct Protein: NEGATIVE mg/dL
Poct Urobilinogen: NORMAL mg/dL
Spec Grav, UA: 1.01 (ref 1.010–1.025)
pH, UA: 5 (ref 5.0–8.0)

## 2019-11-13 MED ORDER — BUPROPION HCL ER (SR) 150 MG PO TB12: 150.0000 mg | ORAL_TABLET | Freq: Two times a day (BID) | ORAL | 1 refills | Status: DC

## 2019-11-13 MED ORDER — ALBUTEROL SULFATE HFA 108 (90 BASE) MCG/ACT IN AERS: INHALATION_SPRAY | RESPIRATORY_TRACT | 5 refills | Status: DC

## 2019-11-13 NOTE — Progress Notes (Signed)
   Subjective:    Patient ID: Kristen Mack, female    DOB: 02/20/1946, 74 y.o.   MRN: [id number]  HPIfollow up.   Needs refill on albuterol inhaler, nystatin, and wellbutrin.  She relates a lot of back tightness pressure discomfort hurts with certain movements.  She feels a urinary tract infection is coming back but denies dysuria hematuria. Thinks her UTI is coming back. Feeling hot and cold. Was on keflex for UTI.   Denies high fever chills Review of Systems See above    Objective:   Physical Exam  Lungs clear heart regular low back subjective tenderness negative flank pain extremities no edema      Assessment & Plan:  Urinary track concerns-UA negative.  Culture sent await results no antibiotics indicated currently  Low back pain and discomfort patient will do regular physical activity stretching exercises and her DVD physical therapy three times a week over the next few weeks if she does not make improvements then referral to physical therapy for further evaluation and treatment of her back pain  Patient has an adrenal CAT scan coming up we will look at the pancreas as well.  Patient may well still need MRI of the pancreas based upon study 2 years ago 2019

## 2019-11-14 ENCOUNTER — Other Ambulatory Visit: Payer: Self-pay | Admitting: *Deleted

## 2019-11-14 MED ORDER — NYSTATIN 100000 UNIT/GM EX OINT: 1.0000 "application " | TOPICAL_OINTMENT | Freq: Two times a day (BID) | CUTANEOUS | 5 refills | Status: DC

## 2019-11-16 LAB — URINE CULTURE

## 2019-11-16 LAB — SPECIMEN STATUS REPORT

## 2019-11-19 ENCOUNTER — Other Ambulatory Visit: Payer: Self-pay

## 2019-11-19 ENCOUNTER — Ambulatory Visit (HOSPITAL_COMMUNITY)
Admission: RE | Admit: 2019-11-19 | Discharge: 2019-11-19 | Disposition: A | Discharge status: Home / Self Care | Payer: PPO | Source: Ambulatory Visit | Attending: "Endocrinology | Admitting: "Endocrinology

## 2019-11-19 DIAGNOSIS — D3502 Benign neoplasm of left adrenal gland: Secondary | ICD-10-CM

## 2019-11-19 DIAGNOSIS — K8689 Other specified diseases of pancreas: Secondary | ICD-10-CM | POA: Diagnosis not present

## 2019-11-19 DIAGNOSIS — K862 Cyst of pancreas: Secondary | ICD-10-CM | POA: Diagnosis not present

## 2019-11-19 DIAGNOSIS — I700 Atherosclerosis of aorta: Secondary | ICD-10-CM | POA: Diagnosis not present

## 2019-11-19 DIAGNOSIS — E278 Other specified disorders of adrenal gland: Secondary | ICD-10-CM | POA: Diagnosis not present

## 2019-11-22 ENCOUNTER — Encounter: Payer: Self-pay | Admitting: Family Medicine

## 2019-11-22 ENCOUNTER — Telehealth: Payer: Self-pay | Admitting: Family Medicine

## 2019-11-22 MED ORDER — NITROFURANTOIN MONOHYD MACRO 100 MG PO CAPS: ORAL_CAPSULE | ORAL | 0 refills | Status: DC

## 2019-11-22 NOTE — Telephone Encounter (Signed)
Dr Nicki Reaper recommended Pt to go on macrobid and nothing was sent to [facility]   Pt call back [phone]

## 2019-11-22 NOTE — Telephone Encounter (Signed)
Per result note; Macrobid BID for 5 days. Macrobid sent to [facility] and pt is aware.

## 2019-11-23 ENCOUNTER — Telehealth: Payer: Self-pay | Admitting: *Deleted

## 2019-11-23 NOTE — Telephone Encounter (Signed)
Patient is in [facility] file for a MRI of pancreas for Pancreas cyst- currently seeing Dr Dorris Fetch and just had CT adrenal and abdomen this week by Dr Dorris Fetch- does she still need the MRI scheduled

## 2019-11-25 NOTE — Telephone Encounter (Signed)
I would recommend the patient using Macrobid I do not feel this will affect her cyst on the pancreas I recommend the patient do a virtual follow-up (Due to increased delta illnesses within the community virtual would be most appropriate) At that virtual visit we can discuss setting her up for MRI surgical consult etc. This visit needs to be within the next 2 weeks

## 2019-12-02 NOTE — Telephone Encounter (Signed)
Patient is due a MRI of the pancreas Please notify patient I would like to do a telephone visit with her regarding this so that we can help set this up It is not necessary for the patient to do a in person visit for this particular issue and would help prevent her being around others during this delta surge

## 2019-12-04 NOTE — Telephone Encounter (Signed)
Tried to call. Phone number is not working.

## 2019-12-05 NOTE — Telephone Encounter (Signed)
Number is not in service. Sent my chart message

## 2019-12-07 NOTE — Telephone Encounter (Signed)
Has appt on 9/15

## 2019-12-10 DIAGNOSIS — N184 Chronic kidney disease, stage 4 (severe): Secondary | ICD-10-CM | POA: Diagnosis not present

## 2019-12-11 ENCOUNTER — Other Ambulatory Visit: Payer: Self-pay | Admitting: *Deleted

## 2019-12-11 ENCOUNTER — Encounter (HOSPITAL_COMMUNITY): Payer: Self-pay | Admitting: Physical Therapy

## 2019-12-11 DIAGNOSIS — K862 Cyst of pancreas: Secondary | ICD-10-CM

## 2019-12-11 NOTE — Therapy (Signed)
[place], [place], [address] Phone: [phone]   Fax:  [phone]  Patient Details  Name: Kristen Mack MRN: [id number] Date of Birth: April 02, 1945 Referring Provider:  No ref. provider found  Encounter Date: 12/11/2019   PHYSICAL THERAPY DISCHARGE SUMMARY  Visits from Start of Care:   Current functional level related to goals / functional outcomes: Unknown as patient did not return for re-assessment. See most recent re-assess on 05/23/18   Remaining deficits: Unknown as patient did not return for re-assessment. See most recent re-assess on 05/23/18   Education / Equipment: HEP  Plan: Patient agrees to discharge.  Patient goals were not met. Patient is being discharged due to not returning since the last visit.  ?????     Kristen Mack PT, DPT 11:14 AM, 12/11/19 [place], [place], [address] Phone: [phone]   Fax:  [phone]

## 2019-12-12 ENCOUNTER — Telehealth (INDEPENDENT_AMBULATORY_CARE_PROVIDER_SITE_OTHER): Payer: PPO | Admitting: Family Medicine

## 2019-12-12 DIAGNOSIS — K862 Cyst of pancreas: Secondary | ICD-10-CM | POA: Diagnosis not present

## 2019-12-12 NOTE — Progress Notes (Addendum)
   Subjective:    Patient ID: Kristen Mack, female    DOB: 1945-07-26, 74 y.o.   MRN: [id number]  HPI Patient calls in today to discuss having MRI of pancreas.  Virtual Visit via Video Note  I connected with Kristen Mack on 12/23/19 at 11:30 AM EDT by a video enabled telemedicine application and verified that I am speaking with the correct person using two identifiers.  Location: Patient: Home Provider: Office telephone only   I discussed the limitations of evaluation and management by telemedicine and the availability of in person appointments. The patient expressed understanding and agreed to proceed.  History of Present Illness:    Observations/Objective:   Assessment and Plan:   Follow Up Instructions:    I discussed the assessment and treatment plan with the patient. The patient was provided an opportunity to ask questions and all were answered. The patient agreed with the plan and demonstrated an understanding of the instructions.   The patient was advised to call back or seek an in-person evaluation if the symptoms worsen or if the condition fails to improve as anticipated.  I provided 20 minutes including documentation minutes of non-face-to-face time during this encounter.   Sallee Lange, MD   Conversation held today with patient She has a pancreatic cyst which is enlarged in size Has some complex components Need to rule out the possibility of developing cancer Likelihood of cancer is low But it does need an MRI in order to get better luck I talked with the patient regarding this she understands this she is willing to progress forward Review of Systems     Objective:   Physical Exam   Today's visit was via telephone Physical exam was not possible for this visit      Assessment & Plan:  Pancreatic cyst Proceed forward with MRI because it is enlarging need to rule out cancer If progressive troubles or problems may need referral to a  specialist Await these results

## 2019-12-14 ENCOUNTER — Encounter: Payer: Self-pay | Admitting: Family Medicine

## 2019-12-25 ENCOUNTER — Other Ambulatory Visit: Payer: Self-pay | Admitting: Family Medicine

## 2019-12-25 ENCOUNTER — Ambulatory Visit (HOSPITAL_COMMUNITY)
Admission: RE | Admit: 2019-12-25 | Discharge: 2019-12-25 | Disposition: A | Discharge status: Home / Self Care | Payer: PPO | Source: Ambulatory Visit | Attending: Family Medicine | Admitting: Family Medicine

## 2019-12-25 ENCOUNTER — Other Ambulatory Visit: Payer: Self-pay

## 2019-12-25 DIAGNOSIS — K862 Cyst of pancreas: Secondary | ICD-10-CM

## 2019-12-25 DIAGNOSIS — D3502 Benign neoplasm of left adrenal gland: Secondary | ICD-10-CM | POA: Diagnosis not present

## 2019-12-25 DIAGNOSIS — K449 Diaphragmatic hernia without obstruction or gangrene: Secondary | ICD-10-CM | POA: Diagnosis not present

## 2019-12-25 DIAGNOSIS — K7689 Other specified diseases of liver: Secondary | ICD-10-CM | POA: Diagnosis not present

## 2019-12-25 MED ORDER — GADOBUTROL 1 MMOL/ML IV SOLN
10.0000 mL | Freq: Once | INTRAVENOUS | Status: AC | PRN
  Administered 2019-12-25: 10 mL via INTRAVENOUS

## 2019-12-28 ENCOUNTER — Telehealth: Payer: Self-pay | Admitting: *Deleted

## 2019-12-28 ENCOUNTER — Encounter: Payer: Self-pay | Admitting: Family Medicine

## 2019-12-28 ENCOUNTER — Other Ambulatory Visit: Payer: Self-pay | Admitting: *Deleted

## 2019-12-28 NOTE — Telephone Encounter (Signed)
Please contact pt to schedule office visit with Dr. Nicki Reaper in the next 2 weeks to follow up on scans and also schedule for a med check in 6 March.

## 2020-01-01 ENCOUNTER — Other Ambulatory Visit: Payer: Self-pay | Admitting: Family Medicine

## 2020-01-01 DIAGNOSIS — K862 Cyst of pancreas: Secondary | ICD-10-CM

## 2020-01-01 DIAGNOSIS — R9389 Abnormal findings on diagnostic imaging of other specified body structures: Secondary | ICD-10-CM

## 2020-01-01 DIAGNOSIS — N859 Noninflammatory disorder of uterus, unspecified: Secondary | ICD-10-CM

## 2020-01-07 ENCOUNTER — Ambulatory Visit (HOSPITAL_COMMUNITY)
Admission: RE | Admit: 2020-01-07 | Discharge: 2020-01-07 | Disposition: A | Discharge status: Home / Self Care | Payer: PPO | Source: Ambulatory Visit | Attending: Family Medicine | Admitting: Family Medicine

## 2020-01-07 ENCOUNTER — Other Ambulatory Visit: Payer: Self-pay

## 2020-01-07 DIAGNOSIS — Z780 Asymptomatic menopausal state: Secondary | ICD-10-CM | POA: Diagnosis not present

## 2020-01-07 DIAGNOSIS — R9389 Abnormal findings on diagnostic imaging of other specified body structures: Secondary | ICD-10-CM | POA: Insufficient documentation

## 2020-01-07 DIAGNOSIS — N859 Noninflammatory disorder of uterus, unspecified: Secondary | ICD-10-CM | POA: Insufficient documentation

## 2020-01-07 DIAGNOSIS — N184 Chronic kidney disease, stage 4 (severe): Secondary | ICD-10-CM | POA: Diagnosis not present

## 2020-01-07 DIAGNOSIS — N8500 Endometrial hyperplasia, unspecified: Secondary | ICD-10-CM | POA: Diagnosis not present

## 2020-01-07 DIAGNOSIS — N858 Other specified noninflammatory disorders of uterus: Secondary | ICD-10-CM | POA: Diagnosis not present

## 2020-01-10 ENCOUNTER — Other Ambulatory Visit: Payer: Self-pay | Admitting: Family Medicine

## 2020-01-10 DIAGNOSIS — R9389 Abnormal findings on diagnostic imaging of other specified body structures: Secondary | ICD-10-CM

## 2020-01-11 ENCOUNTER — Other Ambulatory Visit: Payer: Self-pay | Admitting: Family Medicine

## 2020-01-14 DIAGNOSIS — I129 Hypertensive chronic kidney disease with stage 1 through stage 4 chronic kidney disease, or unspecified chronic kidney disease: Secondary | ICD-10-CM | POA: Diagnosis not present

## 2020-01-14 DIAGNOSIS — K7581 Nonalcoholic steatohepatitis (NASH): Secondary | ICD-10-CM | POA: Diagnosis not present

## 2020-01-14 DIAGNOSIS — E1122 Type 2 diabetes mellitus with diabetic chronic kidney disease: Secondary | ICD-10-CM | POA: Diagnosis not present

## 2020-01-14 DIAGNOSIS — G4733 Obstructive sleep apnea (adult) (pediatric): Secondary | ICD-10-CM | POA: Diagnosis not present

## 2020-01-14 DIAGNOSIS — K219 Gastro-esophageal reflux disease without esophagitis: Secondary | ICD-10-CM | POA: Diagnosis not present

## 2020-01-14 DIAGNOSIS — N1832 Chronic kidney disease, stage 3b: Secondary | ICD-10-CM | POA: Diagnosis not present

## 2020-01-21 ENCOUNTER — Encounter: Payer: Self-pay | Admitting: Family Medicine

## 2020-01-21 ENCOUNTER — Other Ambulatory Visit: Payer: Self-pay

## 2020-01-21 DIAGNOSIS — N8500 Endometrial hyperplasia, unspecified: Secondary | ICD-10-CM

## 2020-01-21 MED ORDER — ONETOUCH VERIO VI STRP: ORAL_STRIP | 1 refills | Status: DC

## 2020-01-21 NOTE — Telephone Encounter (Signed)
Nurses please go ahead with referral to the GYN doctor mention by [facility] Reason for referral endometrial hyperplasia more than likely will need D&C and biopsy

## 2020-01-22 ENCOUNTER — Encounter: Payer: Self-pay | Admitting: Family Medicine

## 2020-01-22 ENCOUNTER — Other Ambulatory Visit: Payer: Self-pay

## 2020-01-22 ENCOUNTER — Ambulatory Visit (INDEPENDENT_AMBULATORY_CARE_PROVIDER_SITE_OTHER): Payer: PPO | Admitting: Family Medicine

## 2020-01-22 VITALS — BP 134/82 | HR 68 | Temp 97.6°F | Wt 205.6 lb

## 2020-01-22 DIAGNOSIS — R001 Bradycardia, unspecified: Secondary | ICD-10-CM

## 2020-01-22 DIAGNOSIS — Z23 Encounter for immunization: Secondary | ICD-10-CM | POA: Diagnosis not present

## 2020-01-22 DIAGNOSIS — N8500 Endometrial hyperplasia, unspecified: Secondary | ICD-10-CM

## 2020-01-22 DIAGNOSIS — K862 Cyst of pancreas: Secondary | ICD-10-CM

## 2020-01-22 MED ORDER — BUPROPION HCL ER (XL) 300 MG PO TB24: 300.0000 mg | ORAL_TABLET | Freq: Every day | ORAL | 1 refills | Status: DC

## 2020-01-22 NOTE — Progress Notes (Addendum)
   Subjective:    Patient ID: Kristen Mack, female    DOB: [birth year], 74 y.o.   MRN: [id number] Very nice patient HPI Patient comes in today to discuss recent scans. Patient presents today with follow-up regarding the abnormal MRI of the pancreatic cyst also follow-up regarding thickened endometrial stripe and discussion about seeing gynecology for this Patient also reports low HR on and off for the past several weeks. Has been as low as 48 and she feels bad and experiences short term memory loss.  Patient states at times her heart seems to run slow in the 30s or 40s she finds her self feeling very fatigued having a difficult time processing and thinking when these occurred.  She denies any type of chest pressure tightness or pain does relate a lot of fatigue and tiredness  Review of Systems  Constitutional: Negative for activity change and appetite change.  HENT: Negative for congestion and rhinorrhea.   Respiratory: Negative for cough and shortness of breath.   Cardiovascular: Negative for chest pain and leg swelling.  Gastrointestinal: Negative for abdominal pain, nausea and vomiting.  Skin: Negative for color change.  Neurological: Negative for dizziness and weakness.  Psychiatric/Behavioral: Negative for agitation and confusion.       Objective:   Physical Exam Vitals reviewed.  Constitutional:      General: She is not in acute distress. HENT:     Head: Normocephalic.  Cardiovascular:     Rate and Rhythm: Normal rate and regular rhythm.     Heart sounds: Normal heart sounds. No murmur heard.   Pulmonary:     Effort: Pulmonary effort is normal.     Breath sounds: Normal breath sounds.  Lymphadenopathy:     Cervical: No cervical adenopathy.  Neurological:     Mental Status: She is alert.  Psychiatric:        Behavior: Behavior normal.    EKG shows bigeminy issues no significant ST segment changes.  Week this is different compared to previous EKG from January  2019  30 minutes spent today with the patient including chart review and documentation time spent with patient and reviewing over the EKG and going back in and discussing it with her     Assessment & Plan:  1. Endometrial hyperplasia We did discuss how this needs to be evaluated by gynecology that have endometrial biopsy.  We will set her up with a gynecologist she is chosen  2. Bradycardia Significant bradycardia issues that occur intermittently could be a sign that she goes into a junctional rhythm I believe this patient would benefit from telemetry and evaluation by cardiology.  We will try to get her in as quick as possible with cardiology. - PR ELECTROCARDIOGRAM, COMPLETE  3. Need for vaccination Flu vaccine today - Flu Vaccine QUAD High Dose(Fluad)

## 2020-01-22 NOTE — Addendum Note (Signed)
Addended by: Vicente Males on: 01/22/2020 09:48 AM   Modules accepted: Orders

## 2020-01-23 NOTE — Progress Notes (Signed)
01/23/20- urgent referral to cardiology placed

## 2020-01-23 NOTE — Addendum Note (Signed)
Addended by: Vicente Males on: 01/23/2020 03:15 PM   Modules accepted: Orders

## 2020-01-27 NOTE — Progress Notes (Signed)
Cardiology Office Note:   Date:  01/28/2020  NAME:  Kristen Mack    MRN: [id number] DOB:  07-Sep-1945   PCP:  Kathyrn Drown, MD  Cardiologist:  No primary care provider on file.   Referring MD: Kathyrn Drown, MD   Chief Complaint  Patient presents with  . Bradycardia   History of Present Illness:   Kristen Mack is a 74 y.o. female with a hx of hypothyroidism, DM, CKD3 who is being seen today for the evaluation of bradycardia/fatigue at the request of Luking, Elayne Snare, MD.  She reports decreased energy over the last 1 month.  Apparently she has been told her heart rate is in the 30s.  She did obtain an EKG which demonstrates she is in ventricular bigeminy.  Transition point V1 V2 consistent with LVOT PVCs.  She reports she does not feel the extra heartbeats.  She has had no energy.  Apparently her shortness of breath and energy decreased with exertion.  She is feels wore out.  When I did review her lab work she is hyperthyroid.  Her TSH is 0.16 with an elevated free T4.  Apparently her TSH was reduced recently.  A repeat has been checked.  She also has hypokalemia with a potassium of 3.2.  She apparently is on HCTZ and potassium supplementation.  She takes this for its diuretic effect.  Her blood pressure is elevated today at 182/67.  Apparently this is not normal for her.  She is a retired Marine scientist.  She also reports a constellation of symptoms including decreased memory.  She has fibromyalgia and several other medical issues.  She is never had any arrhythmias before.  Never had a heart attack.  She reports she was told she had a stroke.  She does not exercise.  Apparently this was similar to previous.  She is never really been a big exerciser per her report.  She does walk with a walker.  She presents with her daughter today.  She reports no increased depression or anxiety but does suffer from this at times.  She is diabetic but not on a statin.  She reports she is just not interested  in taking 1.  No side effects reported but she will have to contemplate this.  Her most recent A1c is 7.0.  EKG demonstrates ventricular bigeminy.  Several issues that could have caused this including hypertension, hypokalemia, hyperthyroid state.  She reports a family history of heart disease.  She smoked for 3 years in the past.  No alcohol use reported.  No drug use reported.  She does not drink excess caffeine.  She does have a history of sleep apnea but uses her machine.  Problem List 1. Diabetes -A1c 7.0 -T chol 188, HDL 48, LDL 115, TG 139 2. Hypothyroidism  3. CKD 3 4. CVA 5. OSA   Past Medical History: Past Medical History:  Diagnosis Date  . Arthritis   . Asthma   . CFS (chronic fatigue syndrome)   . Cracked tooth    X2  RIGHT LOWER MOLAR AND LEFT UPPER MOLAR  . Fatty liver disease, nonalcoholic   . Fibromyalgia   . GERD (gastroesophageal reflux disease)   . History of adenomatous polyp of colon   . History of cardiac murmur    until age 39  . History of TIA (transient ischemic attack)    per CT scan   . Hypertension   . Hypothyroidism   . IBS (irritable bowel syndrome)   .  Meniere disease   . OSA (obstructive sleep apnea)    CPAP intolerate -- per study 2010  moderate osa--  STATES HAS RAISED HOB  . Osteopenia   . PMB (postmenopausal bleeding)   . Prediabetes   . Sleep apnea 07/11/2017   Abnormal home sleep apnea test February 2019-CPAP ordered  . Tooth loose    LOWER FRONT    Past Surgical History: Past Surgical History:  Procedure Laterality Date  . [place]  . CHOLECYSTECTOMY  1995   and  REPAIR UMBILICAL HERNIA  . COLONOSCOPY N/A 04/19/2013   Procedure: COLONOSCOPY;  Surgeon: Rogene Houston, MD;  Location: [facility];  Service: Endoscopy;  Laterality: N/A;    . ESOPHAGOGASTRODUODENOSCOPY  02-08-2003  . HYSTEROSCOPY WITH D & C  11-14-2002   endometrial polypectomy  . HYSTEROSCOPY WITH D & C N/A 04/05/2014   Procedure:  DILATATION AND CURETTAGE /HYSTEROSCOPY, ENDOMETRIAL POLYPECTOMY;  Surgeon: Sanjuana Kava, MD;  Location: [facility];  Service: Gynecology;  Laterality: N/A;  . LIVER BIOPSY  01-29-2004   benign    Current Medications: Current Meds  Medication Sig  . albuterol (VENTOLIN HFA) 108 (90 Base) MCG/ACT inhaler INHALE 2 PUFFS INTO LUNGS EVERY 6 HOURS AS NEEDED FOR SHORTNESS OF BREATH  . AMBULATORY NON FORMULARY MEDICATION Medication Name: Liver Essentials- Take 2 capsules by mouth 2-3 times daily  . amLODipine (NORVASC) 10 MG tablet Take one tablet by mouth daily  . azelastine (ASTELIN) 0.1 % nasal spray Place 2 sprays into both nostrils 2 (two) times daily.  . B Complex Vitamins (B COMPLEX 100 PO) Take 1 tablet by mouth daily.  Marland Kitchen buPROPion (WELLBUTRIN XL) 300 MG 24 hr tablet Take 1 tablet (300 mg total) by mouth daily.  . Calcium Carb-Cholecalciferol (CALCIUM 500 + D3 PO) Take by mouth.  . Chromium 1000 MCG TABS Take 1 tablet by mouth daily.   . Coenzyme Q10 (COQ10) 200 MG CAPS Take 1 capsule by mouth daily.  . Cyanocobalamin (B-12) 1000 MCG SUBL Place 1 tablet under the tongue daily.   Marland Kitchen DIGESTIVE ENZYMES PO Take 1 tablet by mouth 3 (three) times daily.   Marland Kitchen glucose blood (ONETOUCH VERIO) test strip Test once daily  . ipratropium (ATROVENT) 0.02 % nebulizer solution USE ONE VIAL IN NEBULIZER 4 TIMES DAILY AS NEEDED FOR SHORTNESS OF BREATH  . levothyroxine (EUTHYROX) 137 MCG tablet Take 1 tablet (137 mcg total) by mouth daily before breakfast.  . Lysine HCl 500 MG TABS Take 1 tablet by mouth daily. Take as needed  . Magnesium 250 MG TABS Take by mouth 2 (two) times daily.  . meclizine (ANTIVERT) 50 MG tablet Take 50 mg by mouth daily as needed for dizziness. Take as needed  . montelukast (SINGULAIR) 10 MG tablet TAKE 1 TABLET BY MOUTH AT BEDTIME  . nystatin ointment (MYCOSTATIN) Apply 1 application topically 2 (two) times daily.  Marland Kitchen OVER THE COUNTER MEDICATION Fennel seeds. One tsp  with meals  . OVER THE COUNTER MEDICATION Tart cherry extract  . OVER THE COUNTER MEDICATION tumeric  . OVER THE COUNTER MEDICATION Sleep essentials  . sitaGLIPtin (JANUVIA) 100 MG tablet Take 1 tablet (100 mg total) by mouth daily.  Marland Kitchen tiZANidine (ZANAFLEX) 4 MG capsule Take 1 capsule (4 mg total) by mouth 3 (three) times daily as needed for muscle spasms.  Marland Kitchen VITAMIN A PO Take 10,000 mcg by mouth daily.   . vitamin C (ASCORBIC ACID) 500 MG  tablet Take 6,000 mg by mouth daily.   . vitamin E 100 UNIT capsule Take 800 Units by mouth daily.   . [DISCONTINUED] hydrochlorothiazide (MICROZIDE) 12.5 MG capsule Take 1 capsule by mouth once daily in the morning  . [DISCONTINUED] potassium chloride SA (KLOR-CON M20) 20 MEQ tablet Take 1 tablet (20 mEq total) by mouth daily.     Allergies:    Ambien [zolpidem tartrate], Cefzil [cefprozil], Codeine, Demerol [meperidine], Formaldehyde, Latex, Sulfa antibiotics, and Celexa [citalopram hydrobromide]   Social History: Social History   Socioeconomic History  . Marital status: Married    Spouse name: Not on file  . Number of children: 2  . Years of education: Not on file  . Highest education level: Not on file  Occupational History  . Occupation: retired, Therapist, sports  Tobacco Use  . Smoking status: Former Smoker    Packs/day: 0.50    Years: 3.00    Pack years: 1.50    Types: Cigarettes    Quit date: 10/03/1968    Years since quitting: 51.3  . Smokeless tobacco: Never Used  Substance and Sexual Activity  . Alcohol use: No    Alcohol/week: 0.0 standard drinks  . Drug use: No  . Sexual activity: Not on file  Other Topics Concern  . Not on file  Social History Narrative  . Not on file   Social Determinants of Health   Financial Resource Strain:   . Difficulty of Paying Living Expenses: Not on file  Food Insecurity:   . Worried About Charity fundraiser in the Last Year: Not on file  . Ran Out of Food in the Last Year: Not on file  Transportation  Needs:   . Lack of Transportation (Medical): Not on file  . Lack of Transportation (Non-Medical): Not on file  Physical Activity:   . Days of Exercise per Week: Not on file  . Minutes of Exercise per Session: Not on file  Stress:   . Feeling of Stress : Not on file  Social Connections:   . Frequency of Communication with Friends and Family: Not on file  . Frequency of Social Gatherings with Friends and Family: Not on file  . Attends Religious Services: Not on file  . Active Member of Clubs or Organizations: Not on file  . Attends Archivist Meetings: Not on file  . Marital Status: Not on file     Family History: The patient's family history includes Breast cancer in her maternal aunt and paternal aunt; Diabetes in her father and mother; Heart disease in her paternal grandfather; Hypertension in her father and mother; Liver disease in her sister; Ovarian cancer (age of onset: 43) in her mother; Rectal cancer (age of onset: 24) in her brother. There is no history of Pancreatic cancer or Esophageal cancer.  ROS:   All other ROS reviewed and negative. Pertinent positives noted in the HPI.     EKGs/Labs/Other Studies Reviewed:   The following studies were personally reviewed by me today:  EKG:  EKG is ordered today.  The ekg ordered today demonstrates normal sinus rhythm, heart rate 83, ventricular bigeminy appears to be present with transition between V1 and V2 consistent with LVOT outflow PVC, and was personally reviewed by me.   TTE 07/03/2019 1. Left ventricular ejection fraction, by estimation, is 60 to 65%. The  left ventricle has normal function. Left ventricular endocardial border  not optimally defined to evaluate regional wall motion. There is moderate  left ventricular hypertrophy.  Left  ventricular diastolic parameters are consistent with Grade I diastolic  dysfunction (impaired relaxation).  2. Right ventricular systolic function is normal. The right ventricular    size is normal.  3. The mitral valve is normal in structure. No evidence of mitral valve  regurgitation. No evidence of mitral stenosis.  4. The aortic valve is tricuspid. Aortic valve regurgitation is not  visualized. No aortic stenosis is present.  5. Aortic dilatation noted. There is mild dilatation of the ascending  aorta measuring 37 mm.  6. Indeterminant PASP, inadequate TR jet.  7. The inferior vena cava is normal in size with greater than 50%  respiratory variability, suggesting right atrial pressure of 3 mmHg.   Recent Labs: 07/30/2019: Magnesium 2.2 11/01/2019: ALT 31; BUN 28; Creatinine, Ser 1.63; Potassium 3.2; Sodium 139; TSH 0.168   Recent Lipid Panel    Component Value Date/Time   CHOL 188 11/01/2019 1442   TRIG 139 11/01/2019 1442   HDL 48 11/01/2019 1442   CHOLHDL 3.9 11/01/2019 1442   CHOLHDL 3.3 05/07/2014 1122   VLDL 24 05/07/2014 1122   LDLCALC 115 (H) 11/01/2019 1442    Physical Exam:   VS:  BP (!) 182/67   Pulse 83   Ht 5' 1"  (1.549 m)   Wt 205 lb 9.6 oz (93.3 kg)   SpO2 94%   BMI 38.85 kg/m    Wt Readings from Last 3 Encounters:  01/28/20 205 lb 9.6 oz (93.3 kg)  01/22/20 205 lb 9.6 oz (93.3 kg)  11/13/19 202 lb 9.6 oz (91.9 kg)    General: Well nourished, well developed, in no acute distress Heart: Atraumatic, normal size  Eyes: PEERLA, EOMI  Neck: Supple, no JVD Endocrine: No thryomegaly Cardiac: Normal S1, S2; RRR; no murmurs, rubs, or gallops Lungs: Clear to auscultation bilaterally, no wheezing, rhonchi or rales  Abd: Soft, nontender, no hepatomegaly  Ext: No edema, pulses 2+ Musculoskeletal: No deformities, BUE and BLE strength normal and equal Skin: Warm and dry, no rashes   Neuro: Alert and oriented to person, place, time, and situation, CNII-XII grossly intact, no focal deficits  Psych: Normal mood and affect   ASSESSMENT:   Kristen Mack is a 74 y.o. female who presents for the following: 1. Bradycardia   2. Mixed  hyperlipidemia   3. PVC (premature ventricular contraction)   4. Nonspecific abnormal electrocardiogram (ECG) (EKG)   5. SOB (shortness of breath)   6. Fatigue, unspecified type     PLAN:   1. Bradycardia -She actually is not bradycardic.  She just effectively has a low pulse due to compensatory pauses from PVCs.  See below.  2. Mixed hyperlipidemia -She is diabetic.  LDL 115.  Needs to be on a statin.  She reports she will think about this.  Has declined statins in the past.  3. PVC (premature ventricular contraction) 4. Nonspecific abnormal electrocardiogram (ECG) (EKG) 5. SOB 6. Fatigue -EKG demonstrates ventricular bigeminy.  Transition point is V1-2 suggestive of LVOT outflow tract PVC.  Triggers could be hyperthyroid state.  Recheck TSH.  Could also be hypokalemia related.  Recheck potassium today.  I recommended to stop her HCTZ and potassium and put her on Aldactone.  This will alleviate the need for potassium. -Her blood pressure is also elevated.  Seems to be isolated.  BP can also raise this. -I have encouraged adequate use of CPAP.  Sleep apnea can cause this. -She recently had an echocardiogram in April that was normal.  We  will recheck a limited to make sure nothing is changed.  She describes no chest pain or pressure.  Suspect this is all secondary to either iatrogenic hypothyroidism or hypokalemia.  We will go ahead and started on diltiazem 120 mg extended release tablet.  This will help suppress them.  I have also recommended a 3-day Zio patch to quantify her PVC burden.  She may need a more aggressive therapy based on the findings of that.  We will plan for the above work-up and then see her back in 3 months.  No indication for cardiac stress test at this time.   Disposition: Return in about 3 months (around 04/29/2020).  Medication Adjustments/Labs and Tests Ordered: Current medicines are reviewed at length with the patient today.  Concerns regarding medicines are outlined  above.  Orders Placed This Encounter  Procedures  . TSH  . Basic metabolic panel  . B Nat Peptide  . LONG TERM MONITOR (3-14 DAYS)  . EKG 12-Lead  . ECHOCARDIOGRAM LIMITED   Meds ordered this encounter  Medications  . spironolactone (ALDACTONE) 25 MG tablet    Sig: Take 1/2 tablet ( 12.5 mg ) daily    Dispense:  45 tablet    Refill:  3  . diltiazem (CARDIZEM CD) 120 MG 24 hr capsule    Sig: Take 1 capsule (120 mg total) by mouth daily.    Dispense:  90 capsule    Refill:  3    Patient Instructions  Medication Instructions:  Stop HCTZ Stop Potassium Start Aldactone 25 mg 1/2 tablet daily Start Diltiazem 120 mg daily Continue all other medications *If you need a refill on your cardiac medications before your next appointment, please call your pharmacy*   Lab Work: Tsh,bnp,bmet today If you have labs (blood work) drawn today and your tests are completely normal, you will receive your results only by: Marland Kitchen MyChart Message (if you have MyChart) OR . A paper copy in the mail If you have any lab test that is abnormal or we need to change your treatment, we will call you to review the results.   Testing/Procedures: Limited Echo 3 day Zio Monitor  Wear compression stockings during day minimal compression [facility] )  Follow-Up: At [facility], you and your health needs are our priority.  As part of our continuing mission to provide you with exceptional heart care, we have created designated Provider Care Teams.  These Care Teams include your primary Cardiologist (physician) and Advanced Practice Providers (APPs -  Physician Assistants and Nurse Practitioners) who all work together to provide you with the care you need, when you need it.  We recommend signing up for the patient portal called "MyChart".  Sign up information is provided on this After Visit Summary.  MyChart is used to connect with patients for Virtual Visits (Telemedicine).  Patients are able to view  lab/test results, encounter notes, upcoming appointments, etc.  Non-urgent messages can be sent to your provider as well.   To learn more about what you can do with MyChart, go to [email or web address].    Your next appointment: 3 months    The format for your next appointment: Office    Provider:  Dr.O'Neal      Signed, Addison Naegeli. Audie Box, [place]  [address] [place], [place] [address] [phone]  01/28/2020 3:58 PM

## 2020-01-28 ENCOUNTER — Other Ambulatory Visit: Payer: Self-pay

## 2020-01-28 ENCOUNTER — Encounter: Payer: Self-pay | Admitting: Cardiovascular Disease

## 2020-01-28 ENCOUNTER — Ambulatory Visit: Payer: PPO | Admitting: Cardiovascular Disease

## 2020-01-28 VITALS — BP 182/67 | HR 83 | Ht 61.0 in | Wt 205.6 lb

## 2020-01-28 DIAGNOSIS — R9431 Abnormal electrocardiogram [ECG] [EKG]: Secondary | ICD-10-CM

## 2020-01-28 DIAGNOSIS — E782 Mixed hyperlipidemia: Secondary | ICD-10-CM | POA: Diagnosis not present

## 2020-01-28 DIAGNOSIS — R0602 Shortness of breath: Secondary | ICD-10-CM | POA: Diagnosis not present

## 2020-01-28 DIAGNOSIS — R001 Bradycardia, unspecified: Secondary | ICD-10-CM

## 2020-01-28 DIAGNOSIS — R5383 Other fatigue: Secondary | ICD-10-CM

## 2020-01-28 DIAGNOSIS — I493 Ventricular premature depolarization: Secondary | ICD-10-CM

## 2020-01-28 MED ORDER — DILTIAZEM HCL ER COATED BEADS 120 MG PO CP24: 120.0000 mg | ORAL_CAPSULE | Freq: Every day | ORAL | 3 refills | Status: DC

## 2020-01-28 MED ORDER — SPIRONOLACTONE 25 MG PO TABS: ORAL_TABLET | ORAL | 3 refills | Status: DC

## 2020-01-28 NOTE — Patient Instructions (Addendum)
Medication Instructions:  Stop HCTZ Stop Potassium Start Aldactone 25 mg 1/2 tablet daily Start Diltiazem 120 mg daily Continue all other medications *If you need a refill on your cardiac medications before your next appointment, please call your pharmacy*   Lab Work: Tsh,bnp,bmet today If you have labs (blood work) drawn today and your tests are completely normal, you will receive your results only by:  MyChart Message (if you have MyChart) OR  A paper copy in the mail If you have any lab test that is abnormal or we need to change your treatment, we will call you to review the results.   Testing/Procedures: Limited Echo 3 day Zio Monitor  Wear compression stockings during day minimal compression [facility] )  Follow-Up: At [facility], you and your health needs are our priority.  As part of our continuing mission to provide you with exceptional heart care, we have created designated Provider Care Teams.  These Care Teams include your primary Cardiologist (physician) and Advanced Practice Providers (APPs -  Physician Assistants and Nurse Practitioners) who all work together to provide you with the care you need, when you need it.  We recommend signing up for the patient portal called "MyChart".  Sign up information is provided on this After Visit Summary.  MyChart is used to connect with patients for Virtual Visits (Telemedicine).  Patients are able to view lab/test results, encounter notes, upcoming appointments, etc.  Non-urgent messages can be sent to your provider as well.   To learn more about what you can do with MyChart, go to [email or web address].    Your next appointment: 3 months    The format for your next appointment: Office    Provider:  Dr.O'Neal

## 2020-01-29 LAB — BASIC METABOLIC PANEL
BUN/Creatinine Ratio: 20 (ref 12–28)
BUN: 31 mg/dL — ABNORMAL HIGH (ref 8–27)
CO2: 22 mmol/L (ref 20–29)
Calcium: 10 mg/dL (ref 8.7–10.3)
Chloride: 101 mmol/L (ref 96–106)
Creatinine, Ser: 1.54 mg/dL — ABNORMAL HIGH (ref 0.57–1.00)
GFR calc Af Amer: 38 mL/min/{1.73_m2} — ABNORMAL LOW (ref >59)
GFR calc non Af Amer: 33 mL/min/{1.73_m2} — ABNORMAL LOW (ref >59)
Glucose: 121 mg/dL — ABNORMAL HIGH (ref 65–99)
Potassium: 3.6 mmol/L (ref 3.5–5.2)
Sodium: 140 mmol/L (ref 134–144)

## 2020-01-29 LAB — BRAIN NATRIURETIC PEPTIDE: BNP: 255.7 pg/mL — ABNORMAL HIGH (ref 0.0–100.0)

## 2020-01-29 LAB — TSH: TSH: 0.32 u[IU]/mL — ABNORMAL LOW (ref 0.450–4.500)

## 2020-01-30 ENCOUNTER — Ambulatory Visit (INDEPENDENT_AMBULATORY_CARE_PROVIDER_SITE_OTHER): Payer: PPO

## 2020-01-30 DIAGNOSIS — R001 Bradycardia, unspecified: Secondary | ICD-10-CM | POA: Diagnosis not present

## 2020-01-30 DIAGNOSIS — E782 Mixed hyperlipidemia: Secondary | ICD-10-CM

## 2020-01-30 DIAGNOSIS — D3502 Benign neoplasm of left adrenal gland: Secondary | ICD-10-CM | POA: Diagnosis not present

## 2020-01-30 DIAGNOSIS — R5383 Other fatigue: Secondary | ICD-10-CM

## 2020-01-30 DIAGNOSIS — I493 Ventricular premature depolarization: Secondary | ICD-10-CM | POA: Diagnosis not present

## 2020-01-30 DIAGNOSIS — R9431 Abnormal electrocardiogram [ECG] [EKG]: Secondary | ICD-10-CM

## 2020-01-30 DIAGNOSIS — R0602 Shortness of breath: Secondary | ICD-10-CM

## 2020-01-30 DIAGNOSIS — E038 Other specified hypothyroidism: Secondary | ICD-10-CM | POA: Diagnosis not present

## 2020-02-01 ENCOUNTER — Ambulatory Visit (HOSPITAL_COMMUNITY)
Admission: RE | Admit: 2020-02-01 | Discharge: 2020-02-01 | Disposition: A | Discharge status: Home / Self Care | Payer: PPO | Source: Ambulatory Visit | Attending: Cardiovascular Disease | Admitting: Cardiovascular Disease

## 2020-02-01 ENCOUNTER — Other Ambulatory Visit: Payer: Self-pay

## 2020-02-01 DIAGNOSIS — I493 Ventricular premature depolarization: Secondary | ICD-10-CM

## 2020-02-01 DIAGNOSIS — E782 Mixed hyperlipidemia: Secondary | ICD-10-CM

## 2020-02-01 DIAGNOSIS — R001 Bradycardia, unspecified: Secondary | ICD-10-CM

## 2020-02-01 DIAGNOSIS — R9431 Abnormal electrocardiogram [ECG] [EKG]: Secondary | ICD-10-CM | POA: Diagnosis not present

## 2020-02-01 LAB — ECHOCARDIOGRAM LIMITED
Area-P 1/2: 4.04 cm2
S' Lateral: 2.72 cm

## 2020-02-01 NOTE — Progress Notes (Addendum)
*  PRELIMINARY RESULTS* Echocardiogram Limited 2-D Echocardiogram has been performed.  Kristen Mack 02/01/2020, 3:41 PM

## 2020-02-04 LAB — T4, FREE: Free T4: 1.66 ng/dL (ref 0.82–1.77)

## 2020-02-04 LAB — METANEPHRINES, PLASMA
Metanephrine, Free: 11.7 pg/mL (ref 0.0–88.0)
Normetanephrine, Free: 61.7 pg/mL (ref 0.0–191.8)

## 2020-02-05 ENCOUNTER — Other Ambulatory Visit: Payer: Self-pay | Admitting: *Deleted

## 2020-02-05 MED ORDER — LEVOTHYROXINE SODIUM 112 MCG PO TABS: 112.0000 ug | ORAL_TABLET | Freq: Every day | ORAL | 1 refills | Status: DC

## 2020-02-06 ENCOUNTER — Encounter: Payer: Self-pay | Admitting: "Endocrinology

## 2020-02-06 ENCOUNTER — Ambulatory Visit (INDEPENDENT_AMBULATORY_CARE_PROVIDER_SITE_OTHER): Payer: PPO | Admitting: "Endocrinology

## 2020-02-06 VITALS — BP 116/78 | HR 72 | Ht 61.0 in | Wt 211.0 lb

## 2020-02-06 DIAGNOSIS — E038 Other specified hypothyroidism: Secondary | ICD-10-CM | POA: Diagnosis not present

## 2020-02-06 DIAGNOSIS — E119 Type 2 diabetes mellitus without complications: Secondary | ICD-10-CM

## 2020-02-06 DIAGNOSIS — E782 Mixed hyperlipidemia: Secondary | ICD-10-CM

## 2020-02-06 DIAGNOSIS — D3502 Benign neoplasm of left adrenal gland: Secondary | ICD-10-CM

## 2020-02-06 LAB — POCT GLYCOSYLATED HEMOGLOBIN (HGB A1C): Hemoglobin A1C: 6.5 % — AB (ref 4.0–5.6)

## 2020-02-06 MED ORDER — ROSUVASTATIN CALCIUM 5 MG PO TABS: 5.0000 mg | ORAL_TABLET | Freq: Every day | ORAL | 1 refills | Status: DC

## 2020-02-06 NOTE — Progress Notes (Signed)
02/06/2020, 3:27 PM      Endocrinology follow-up note   Subjective:    Patient ID: Kristen Mack, female    DOB: 14-Dec-1945, PCP Kathyrn Drown, MD   Past Medical History:  Diagnosis Date  . Arthritis   . Asthma   . CFS (chronic fatigue syndrome)   . Cracked tooth    X2  RIGHT LOWER MOLAR AND LEFT UPPER MOLAR  . Fatty liver disease, nonalcoholic   . Fibromyalgia   . GERD (gastroesophageal reflux disease)   . History of adenomatous polyp of colon   . History of cardiac murmur    until age 40  . History of TIA (transient ischemic attack)    per CT scan   . Hypertension   . Hypothyroidism   . IBS (irritable bowel syndrome)   . Meniere disease   . OSA (obstructive sleep apnea)    CPAP intolerate -- per study 2010  moderate osa--  STATES HAS RAISED HOB  . Osteopenia   . PMB (postmenopausal bleeding)   . Prediabetes   . Sleep apnea 07/11/2017   Abnormal home sleep apnea test February 2019-CPAP ordered  . Tooth loose    LOWER FRONT   Past Surgical History:  Procedure Laterality Date  . [place]  . CHOLECYSTECTOMY  1995   and  REPAIR UMBILICAL HERNIA  . COLONOSCOPY N/A 04/19/2013   Procedure: COLONOSCOPY;  Surgeon: Rogene Houston, MD;  Location: [facility];  Service: Endoscopy;  Laterality: N/A;    . ESOPHAGOGASTRODUODENOSCOPY  02-08-2003  . HYSTEROSCOPY WITH D & C  11-14-2002   endometrial polypectomy  . HYSTEROSCOPY WITH D & C N/A 04/05/2014   Procedure: DILATATION AND CURETTAGE /HYSTEROSCOPY, ENDOMETRIAL POLYPECTOMY;  Surgeon: Sanjuana Kava, MD;  Location: [place];  Service: Gynecology;  Laterality: N/A;  . LIVER BIOPSY  01-29-2004   benign   Social History   Socioeconomic History  . Marital status: Married    Spouse name: Not on file  . Number of children: 2  . Years of education: Not on file  . Highest education level: Not on file  Occupational History  .  Occupation: retired, Therapist, sports  Tobacco Use  . Smoking status: Former Smoker    Packs/day: 0.50    Years: 3.00    Pack years: 1.50    Types: Cigarettes    Quit date: 10/03/1968    Years since quitting: 51.3  . Smokeless tobacco: Never Used  Substance and Sexual Activity  . Alcohol use: No    Alcohol/week: 0.0 standard drinks  . Drug use: No  . Sexual activity: Not on file  Other Topics Concern  . Not on file  Social History Narrative  . Not on file   Social Determinants of Health   Financial Resource Strain:   . Difficulty of Paying Living Expenses: Not on file  Food Insecurity:   . Worried About Charity fundraiser in the Last Year: Not on file  . Ran Out of Food in the Last Year: Not on file  Transportation Needs:   . Lack of Transportation (Medical): Not on file  . Lack of Transportation (Non-Medical): Not on file  Physical Activity:   . Days of Exercise per Week: Not on file  . Minutes of Exercise per Session: Not on file  Stress:   . Feeling of Stress : Not on file  Social Connections:   . Frequency of Communication with Friends and Family: Not on file  . Frequency of Social Gatherings with Friends and Family: Not on file  . Attends Religious Services: Not on file  . Active Member of Clubs or Organizations: Not on file  . Attends Archivist Meetings: Not on file  . Marital Status: Not on file   Outpatient Encounter Medications as of 02/06/2020  Medication Sig  . albuterol (VENTOLIN HFA) 108 (90 Base) MCG/ACT inhaler INHALE 2 PUFFS INTO LUNGS EVERY 6 HOURS AS NEEDED FOR SHORTNESS OF BREATH  . AMBULATORY NON FORMULARY MEDICATION Medication Name: Liver Essentials- Take 2 capsules by mouth 2-3 times daily  . amLODipine (NORVASC) 10 MG tablet Take one tablet by mouth daily  . azelastine (ASTELIN) 0.1 % nasal spray Place 2 sprays into both nostrils 2 (two) times daily.  . B Complex Vitamins (B COMPLEX 100 PO) Take 1 tablet by mouth daily.  Marland Kitchen buPROPion (WELLBUTRIN XL)  300 MG 24 hr tablet Take 1 tablet (300 mg total) by mouth daily.  . Chromium 1000 MCG TABS Take 1 tablet by mouth daily.   . Coenzyme Q10 (COQ10) 200 MG CAPS Take 1 capsule by mouth daily.  . Cyanocobalamin (B-12) 1000 MCG SUBL Place 1 tablet under the tongue daily.   Marland Kitchen DIGESTIVE ENZYMES PO Take 1 tablet by mouth 3 (three) times daily.   Marland Kitchen diltiazem (CARDIZEM CD) 120 MG 24 hr capsule Take 1 capsule (120 mg total) by mouth daily.  Marland Kitchen glucose blood (ONETOUCH VERIO) test strip Test once daily  . ipratropium (ATROVENT) 0.02 % nebulizer solution USE ONE VIAL IN NEBULIZER 4 TIMES DAILY AS NEEDED FOR SHORTNESS OF BREATH  . levothyroxine (SYNTHROID) 112 MCG tablet Take 1 tablet (112 mcg total) by mouth daily.  Marland Kitchen Lysine HCl 500 MG TABS Take 1 tablet by mouth daily. Take as needed  . Magnesium 250 MG TABS Take by mouth 2 (two) times daily.  . meclizine (ANTIVERT) 50 MG tablet Take 50 mg by mouth daily as needed for dizziness. Take as needed  . montelukast (SINGULAIR) 10 MG tablet TAKE 1 TABLET BY MOUTH AT BEDTIME  . nystatin ointment (MYCOSTATIN) Apply 1 application topically 2 (two) times daily.  Marland Kitchen OVER THE COUNTER MEDICATION Fennel seeds. One tsp with meals  . OVER THE COUNTER MEDICATION Tart cherry extract  . OVER THE COUNTER MEDICATION tumeric  . OVER THE COUNTER MEDICATION Sleep essentials  . rosuvastatin (CRESTOR) 5 MG tablet Take 1 tablet (5 mg total) by mouth daily.  . sitaGLIPtin (JANUVIA) 100 MG tablet Take 1 tablet (100 mg total) by mouth daily.  Marland Kitchen spironolactone (ALDACTONE) 25 MG tablet Take 1/2 tablet ( 12.5 mg ) daily  . tiZANidine (ZANAFLEX) 4 MG capsule Take 1 capsule (4 mg total) by mouth 3 (three) times daily as needed for muscle spasms.  Marland Kitchen VITAMIN A PO Take 10,000 mcg by mouth daily.   . vitamin C (ASCORBIC ACID) 500 MG tablet Take 6,000 mg by mouth daily.   . vitamin E 100 UNIT capsule Take 800 Units by mouth daily.   . [DISCONTINUED] Calcium Carb-Cholecalciferol (CALCIUM 500 + D3  PO) Take by mouth.   No facility-administered encounter medications on file as of 02/06/2020.   ALLERGIES: Allergies  Allergen  Reactions  . Ambien [Zolpidem Tartrate] Other (See Comments)    Side effect "last a couple days"  . Cefzil [Cefprozil]     diarrhea  . Codeine Itching and Nausea And Vomiting  . Demerol [Meperidine] Other (See Comments)    "messes up senses"  . Formaldehyde   . Latex Other (See Comments)    "skin gets raw"  . Sulfa Antibiotics Other (See Comments)    Severe abd cramp  . Celexa [Citalopram Hydrobromide] Other (See Comments)    Mouth sores    VACCINATION STATUS: Immunization History  Administered Date(s) Administered  . Fluad Quad(high Dose 65+) 01/22/2020  . Influenza,inj,Quad PF,6+ Mos 03/02/2016, 03/03/2017  . Influenza-Unspecified 04/15/2018, 01/15/2019  . Moderna SARS-COVID-2 Vaccination 05/23/2019, 06/19/2019  . Pneumococcal Conjugate-13 05/22/2014  . Pneumococcal Polysaccharide-23 03/03/2017  . Zoster Recombinat (Shingrix) 12/08/2018, 04/27/2019    HPI DALA BREAULT is 74 y.o. female who was seen in consultation for incidental 1.5 cm left adrenal adenoma which was previously worked up and shown to be nonfunctional.  She is being seen in follow-up .  Her previsit labs for plasma free metanephrines were normal.  MRI of the abdomen in the interval showed stable 1.5 cm left adrenal adenoma. -She is also on follow-up for type 2 diabetes and hypothyroidism.  She is on Januvia 100 mg p.o. daily for type 2 diabetes and levothyroxine recently adjusted to 112 mcg p.o. daily for hypothyroidism. -She has no new complaints today except bilateral lower extremity edema. -Patient denies any prior history of adrenal dysfunction or lesion. -She is known to have pancreatic pseudocyst for some time.   -Her  previous 24-hour urine function studies for adrenal function have been within normal limits .   -She denies history of difficult to control  hypertension, in fact has history of orthostatic hypotension.  She denies spells of palpitations, headaches, sweating. -She denies close  family history of pituitary, adrenal dysfunctions.  Review of Systems  Limited as above.  Objective:    BP 116/78   Pulse 72   Ht _0  (1.549 m)   Wt 211 lb (95.7 kg)   BMI 39.87 kg/m   Wt Readings from Last 3 Encounters:  02/06/20 211 lb (95.7 kg)  01/28/20 205 lb 9.6 oz (93.3 kg)  01/22/20 205 lb 9.6 oz (93.3 kg)    ++ Bilateral lower extremity edema   Recent Results (from the past 2160 hour(s))  Urine Culture     Status: Abnormal   Collection Time: 11/13/19 12:00 AM   Specimen: Urine   Urine  Result Value Ref Range   Urine Culture, Routine Final report (A)    Organism ID, Bacteria Enterococcus faecalis (A)     Comment: 50,000-100,000 colony forming units per mL   ORGANISM ID, BACTERIA Comment     Comment: Mixed urogenital flora 10,000-25,000 colony forming units per mL    Antimicrobial Susceptibility Comment     Comment:       ** S = Susceptible; I = Intermediate; R = Resistant **                    P = Positive; N = Negative             MICS are expressed in micrograms per mL    Antibiotic                 RSLT#1    RSLT#2    RSLT#3    RSLT#4 Ciprofloxacin  S Levofloxacin                   S Nitrofurantoin                 S Penicillin                     S Tetracycline                   R Vancomycin                     S   Specimen status report     Status: None   Collection Time: 11/13/19 12:00 AM  Result Value Ref Range   specimen status report Comment     Comment: Please note Please note The date and/or time of collection was not indicated on the requisition as required by state and federal law.  The date of receipt of the specimen was used as the collection date if not supplied.   POCT Urinalysis Dip Manual     Status: None   Collection Time: 11/13/19  1:21 PM  Result Value Ref Range   Spec Grav,  UA 1.010 1.010 - 1.025   pH, UA 5.0 5.0 - 8.0   Leukocytes, UA Negative Negative   Nitrite, UA Negative Negative   Poct Protein Negative Negative, trace mg/dL   Poct Glucose Normal Normal mg/dL   Poct Ketones Negative Negative   Poct Urobilinogen Normal Normal mg/dL   Poct Bilirubin Negative Negative   Poct Blood Negative Negative, trace  TSH     Status: Abnormal   Collection Time: 01/28/20  3:52 PM  Result Value Ref Range   TSH 0.320 (L) 0.450 - 4.500 uIU/mL  Basic metabolic panel     Status: Abnormal   Collection Time: 01/28/20  3:52 PM  Result Value Ref Range   Glucose 121 (H) 65 - 99 mg/dL   BUN 31 (H) 8 - 27 mg/dL   Creatinine, Ser 1.54 (H) 0.57 - 1.00 mg/dL   GFR calc non Af Amer 33 (L) >59 mL/min/1.73   GFR calc Af Amer 38 (L) >59 mL/min/1.73    Comment: **In accordance with recommendations from the NKF-ASN Task force,**   [facility] is in the process of updating its eGFR calculation to the   2021 CKD-EPI creatinine equation that estimates kidney function   without a race variable.    BUN/Creatinine Ratio 20 12 - 28   Sodium 140 134 - 144 mmol/L   Potassium 3.6 3.5 - 5.2 mmol/L   Chloride 101 96 - 106 mmol/L   CO2 22 20 - 29 mmol/L   Calcium 10.0 8.7 - 10.3 mg/dL  B Nat Peptide     Status: Abnormal   Collection Time: 01/28/20  3:52 PM  Result Value Ref Range   BNP 255.7 (H) 0.0 - 100.0 pg/mL  Metanephrines, plasma     Status: None   Collection Time: 01/30/20 11:33 AM  Result Value Ref Range   Normetanephrine, Free 61.7 0.0 - 191.8 pg/mL    Comment: **Effective February 04, 2020 the reference interval**   for Normetanephrine, Pl will be changing to:            Age              Female             Female         0 -  4  years       Not estab.       Not estab.         5 - 11 years     0.0 - 165.9       0.0 - 148.0        12 - 19 years     0.0 - 150.8       0.0 - 150.8        20 - 40 years     0.0 - 210.1       0.0 - 210.1        41 - 50 years     0.0 - 218.9       0.0 -  218.9        51 - 60 years     0.0 - 244.0       0.0 - 244.0        61 - 80 years     0.0 - 285.2       0.0 - 285.2            >80 years     0.0 - 297.2       0.0 - 297.2    Metanephrine, Free 11.7 0.0 - 88.0 pg/mL  T4, free     Status: None   Collection Time: 01/30/20 11:33 AM  Result Value Ref Range   Free T4 1.66 0.82 - 1.77 ng/dL  ECHOCARDIOGRAM LIMITED     Status: None   Collection Time: 02/01/20  3:41 PM  Result Value Ref Range   Area-P 1/2 4.04 cm2   S' Lateral 2.72 cm  HgB A1c     Status: Abnormal   Collection Time: 02/06/20  1:55 PM  Result Value Ref Range   Hemoglobin A1C 6.5 (A) 4.0 - 5.6 %   HbA1c POC (<> result, manual entry)     HbA1c, POC (prediabetic range)     HbA1c, POC (controlled diabetic range)      Diabetic Labs (most recent): Lab Results  Component Value Date   HGBA1C 6.5 (A) 02/06/2020   HGBA1C 7.0 (H) 11/01/2019   HGBA1C 8.5 (H) 06/28/2019     Lipid Panel ( most recent) Lipid Panel     Component Value Date/Time   CHOL 188 11/01/2019 1442   TRIG 139 11/01/2019 1442   HDL 48 11/01/2019 1442   CHOLHDL 3.9 11/01/2019 1442   CHOLHDL 3.3 05/07/2014 1122   VLDL 24 05/07/2014 1122   LDLCALC 115 (H) 11/01/2019 1442          MRI of abdomen on May 04, 2017:  Normal right adrenal gland. Left adrenal nodule on the order of 1.5 cm is consistent with an adenoma.   Surveillance abdominal MRI from December 25, 2019 IMPRESSION: 1. Motion degraded exam shows multiple tiny cystic lesions in the pancreas. Dominant lesion has minimally increased from 14 mm to 17 mm in the 2.5 year interval since previous MRI. No gross soft tissue component or abnormal enhancement evident on today's markedly motion degraded exam. At 17 mm, consensus guidelines recommend repeat imaging every 6 months for 2 years to ensure stability. This recommendation follows ACR consensus guidelines: Management of Incidental Pancreatic Cysts: A White Paper of the ACR  Incidental Findings Committee. [place] 5456;25:638-937. 2. Additional tiny 6 mm cystic foci in the body/tail region are new. Attention on follow-up recommended. 3. Abnormal fluid in the endometrial cavity of the uterus. Pelvic ultrasound for further  evaluation. 4. Stable left adrenal adenoma. 5. Small hiatal hernia.  Assessment & Plan:   1. Adenoma of left adrenal gland I reviewed her labs and imaging studies with her and her daughter in the exam room. -Her previsit plasma metanephrines are within normal limits.   -She has previously undergone 24-hour urine studies for catecholamines, metanephrines, cortisol, and aldosterone, which are all within normal limits.  -This is consistent with nonfunctioning left adrenal adenoma of 1.5 cm, stable on her most recent abdominal imaging from September 2021.   -She will not require any specific treatment at this time. -Related to her pancreatic cysts/pseudocysts she is scheduled to have MRI every 6 months, will use those  imaging studies to follow-up on the adrenal adenoma.    2.  Type 2 diabetes- -her point-of-care A1c 6.5%, overall improving from 7.5%.   She is advised to continue Januvia 100 mg p.o. daily at breakfast. - she  admits there is a room for improvement in her diet and drink choices. -  Suggestion is made for her to avoid simple carbohydrates  from her diet including Cakes, Sweet Desserts / Pastries, Ice Cream, Soda (diet and regular), Sweet Tea, Candies, Chips, Cookies, Sweet Pastries,  Store Bought Juices, Alcohol in Excess of  1-2 drinks a day, Artificial Sweeteners, Coffee Creamer, and "Sugar-free" Products. This will help patient to have stable blood glucose profile and potentially avoid unintended weight gain.  3.  Hypothyroidism Her previsit thyroid function tests are consistent with over replacement.  Her levothyroxine was appropriately adjusted to 112 mcg p.o. daily at breakfast recently by her PMD.   - We discussed  about the correct intake of her thyroid hormone, on empty stomach at fasting, with water, separated by at least 30 minutes from breakfast and other medications,  and separated by more than 4 hours from calcium, iron, multivitamins, acid reflux medications (PPIs). -Patient is made aware of the fact that thyroid hormone replacement is needed for life, dose to be adjusted by periodic monitoring of thyroid function tests.  -I advised her to wear compression stockings, and leg elevation while sitting in bed in addition to her diuretics.  - I advised patient to maintain close follow up with Kathyrn Drown, MD for primary care needs.    - Time spent on this patient care encounter:  30 minutes of which 50% was spent in  counseling and the rest reviewing  her current and  previous labs / studies and medications  doses and developing a plan for long term care. [name]  participated in the discussions, expressed understanding, and voiced agreement with the above plans.  All questions were answered to her satisfaction. she is encouraged to contact clinic should she have any questions or concerns prior to her return visit.   Follow up plan: Return in about 6 months (around 08/05/2020) for F/U with Pre-visit Labs, A1c -NV.   Glade Lloyd, MD [facility] [address] [place], [place] [address] Phone: [phone]  Fax: [phone]     02/06/2020, 3:27 PM  This note was partially dictated with voice recognition software. Similar sounding words can be transcribed inadequately or may not  be corrected upon review.

## 2020-02-06 NOTE — Patient Instructions (Signed)

## 2020-02-07 ENCOUNTER — Encounter: Payer: Self-pay | Admitting: Family Medicine

## 2020-02-08 ENCOUNTER — Encounter: Payer: Self-pay | Admitting: Family Medicine

## 2020-02-08 DIAGNOSIS — N8500 Endometrial hyperplasia, unspecified: Secondary | ICD-10-CM

## 2020-02-08 DIAGNOSIS — N289 Disorder of kidney and ureter, unspecified: Secondary | ICD-10-CM

## 2020-02-08 NOTE — Telephone Encounter (Signed)
Please order Met 7 to assess kidney function and potassium Contact Dr. Audie Box next week if no improvement in her swelling. Get seen sooner if she develops significant SOB or chest discomfort.  Also, please refer to GYN that accepts Medicare.  Thanks.

## 2020-02-11 DIAGNOSIS — N289 Disorder of kidney and ureter, unspecified: Secondary | ICD-10-CM | POA: Diagnosis not present

## 2020-02-12 LAB — BASIC METABOLIC PANEL
BUN/Creatinine Ratio: 17 (ref 12–28)
BUN: 28 mg/dL — ABNORMAL HIGH (ref 8–27)
CO2: 21 mmol/L (ref 20–29)
Calcium: 9.3 mg/dL (ref 8.7–10.3)
Chloride: 101 mmol/L (ref 96–106)
Creatinine, Ser: 1.69 mg/dL — ABNORMAL HIGH (ref 0.57–1.00)
GFR calc Af Amer: 34 mL/min/{1.73_m2} — ABNORMAL LOW (ref >59)
GFR calc non Af Amer: 29 mL/min/{1.73_m2} — ABNORMAL LOW (ref >59)
Glucose: 138 mg/dL — ABNORMAL HIGH (ref 65–99)
Potassium: 4.5 mmol/L (ref 3.5–5.2)
Sodium: 135 mmol/L (ref 134–144)

## 2020-02-13 ENCOUNTER — Other Ambulatory Visit: Payer: Self-pay

## 2020-02-13 ENCOUNTER — Encounter: Payer: Self-pay | Admitting: Family Medicine

## 2020-02-13 ENCOUNTER — Ambulatory Visit (INDEPENDENT_AMBULATORY_CARE_PROVIDER_SITE_OTHER): Payer: PPO | Admitting: Family Medicine

## 2020-02-13 VITALS — BP 128/84 | HR 74 | Temp 97.6°F | Ht 61.0 in | Wt 210.0 lb

## 2020-02-13 DIAGNOSIS — K7581 Nonalcoholic steatohepatitis (NASH): Secondary | ICD-10-CM

## 2020-02-13 DIAGNOSIS — N289 Disorder of kidney and ureter, unspecified: Secondary | ICD-10-CM

## 2020-02-13 DIAGNOSIS — I493 Ventricular premature depolarization: Secondary | ICD-10-CM | POA: Diagnosis not present

## 2020-02-13 DIAGNOSIS — N184 Chronic kidney disease, stage 4 (severe): Secondary | ICD-10-CM | POA: Diagnosis not present

## 2020-02-13 DIAGNOSIS — E782 Mixed hyperlipidemia: Secondary | ICD-10-CM | POA: Insufficient documentation

## 2020-02-13 DIAGNOSIS — E785 Hyperlipidemia, unspecified: Secondary | ICD-10-CM | POA: Diagnosis not present

## 2020-02-13 DIAGNOSIS — R001 Bradycardia, unspecified: Secondary | ICD-10-CM | POA: Diagnosis not present

## 2020-02-13 DIAGNOSIS — E1169 Type 2 diabetes mellitus with other specified complication: Secondary | ICD-10-CM | POA: Diagnosis not present

## 2020-02-13 DIAGNOSIS — R0602 Shortness of breath: Secondary | ICD-10-CM | POA: Diagnosis not present

## 2020-02-13 DIAGNOSIS — R9431 Abnormal electrocardiogram [ECG] [EKG]: Secondary | ICD-10-CM | POA: Diagnosis not present

## 2020-02-13 MED ORDER — ROSUVASTATIN CALCIUM 5 MG PO TABS: ORAL_TABLET | ORAL | 6 refills | Status: DC

## 2020-02-13 MED ORDER — SPIRONOLACTONE 25 MG PO TABS: 25.0000 mg | ORAL_TABLET | Freq: Every day | ORAL | 5 refills | Status: DC

## 2020-02-13 NOTE — Progress Notes (Signed)
   Subjective:    Patient ID: Kristen Mack, female    DOB: 1946-01-26, 74 y.o.   MRN: [id number] Very nice patient with complex issues. HPIdr nida put pt on crestor. Pt does not want to take it because she states it could make diabetes and liver worse.  Patient is very hesitant to start on this medication on a daily basis because she has a large amount of muscle pains and joint pains as well as underlying liver issues.  We discussed all of this in detail including how being on a statin can reduce the risk of heart disease by at least 40%.  Would like to discuss med changes.  Patient discusses today how she is off of HCTZ and potassium and is currently on a half of an Aldactone daily.  She has noted that she has had increased swelling in the lower legs and in her feet.  She was interested in what to do about this.  Would like to discuss potassium level.  Heart rate yesterday in the 40's.  Cardiologist started her on diltiazem but currently she is on amlodipine as well and this concerned her.  I did discuss the mechanism of action of both of these medications.  This seemed to help her.  Swelling in legs. Pt states she gained 4 lbs in 2 and a half weeks.   Pt declined to do phq9. States she did one at last visit in October.    Review of Systems Please see above denies any chest pressure tightness does get a little short of breath with activity denies PND orthopnea    Objective:   Physical Exam Lungs clear respiratory rate normal heart regular heart rate currently in the low 70s extremities some edema with 1+ pitting edema in the feet the lower legs mainly is nonpitting edema with soft tissue       Assessment & Plan:  1. Kidney function abnormal We will be checking the metabolic 7 to look at her potassium again early next week with pedal edema we will adjust up on the Aldactone to 25 mg every morning - Basic metabolic panel  2. Bradycardia No bradycardia currently.  No ectopy  currently.  Patient will follow up with cardiology continue current medications.  Will send message to cardiology to seek clarification regarding diltiazem and the amlodipine.  3. NASH (nonalcoholic steatohepatitis) Patient try to watch diet watch activity keep weight and check  4. CKD (chronic kidney disease) stage 4, GFR 15-29 ml/min (HCC) Will follow up on kidney functions with the adjustment of Aldactone  5. Hyperlipidemia associated with type 2 diabetes mellitus ([place]) Patient agrees to try the Crestor 2 days/week we will reorder it as of 2 days/week if she does well with this we will gradually go up on the dosage.  Patient very hesitant about this but is willing to try.

## 2020-02-14 NOTE — Progress Notes (Signed)
Scott:  I would stop her norvasc. I did not realize she was on this.   Thanks!  -[employer]

## 2020-02-15 ENCOUNTER — Other Ambulatory Visit: Payer: Self-pay | Admitting: *Deleted

## 2020-02-15 ENCOUNTER — Encounter: Payer: Self-pay | Admitting: Family Medicine

## 2020-02-15 MED ORDER — TIZANIDINE HCL 4 MG PO CAPS: 4.0000 mg | ORAL_CAPSULE | Freq: Three times a day (TID) | ORAL | 0 refills | Status: DC | PRN

## 2020-02-15 NOTE — Telephone Encounter (Signed)
Nurses Please go ahead and move forward with the above request Regarding the tizanidine refill x1 And ordering CPAP supplies as requested

## 2020-02-15 NOTE — Telephone Encounter (Signed)
Med refill sent to [facility] and script for cpap supplies put on dr scott's door to sign.

## 2020-02-17 NOTE — Progress Notes (Signed)
Nurses please communicate with patient that cardiology recommends that she stop her amlodipine.  I would recommend the patient do a follow-up visit mid December to recheck blood pressure thank you

## 2020-02-19 NOTE — Progress Notes (Signed)
Telephone call can not be completed at this time- 02/19/20

## 2020-02-20 DIAGNOSIS — N289 Disorder of kidney and ureter, unspecified: Secondary | ICD-10-CM | POA: Diagnosis not present

## 2020-02-21 LAB — BASIC METABOLIC PANEL
BUN/Creatinine Ratio: 17 (ref 12–28)
BUN: 32 mg/dL — ABNORMAL HIGH (ref 8–27)
CO2: 21 mmol/L (ref 20–29)
Calcium: 10 mg/dL (ref 8.7–10.3)
Chloride: 102 mmol/L (ref 96–106)
Creatinine, Ser: 1.87 mg/dL — ABNORMAL HIGH (ref 0.57–1.00)
GFR calc Af Amer: 30 mL/min/{1.73_m2} — ABNORMAL LOW (ref >59)
GFR calc non Af Amer: 26 mL/min/{1.73_m2} — ABNORMAL LOW (ref >59)
Glucose: 126 mg/dL — ABNORMAL HIGH (ref 65–99)
Potassium: 4.9 mmol/L (ref 3.5–5.2)
Sodium: 137 mmol/L (ref 134–144)

## 2020-02-22 ENCOUNTER — Encounter: Payer: Self-pay | Admitting: Family Medicine

## 2020-02-29 NOTE — Progress Notes (Signed)
Patient was notified to stop amlodipine.

## 2020-03-04 DIAGNOSIS — E119 Type 2 diabetes mellitus without complications: Secondary | ICD-10-CM | POA: Insufficient documentation

## 2020-03-04 DIAGNOSIS — I10 Essential (primary) hypertension: Secondary | ICD-10-CM | POA: Insufficient documentation

## 2020-03-04 DIAGNOSIS — H8109 Meniere's disease, unspecified ear: Secondary | ICD-10-CM | POA: Insufficient documentation

## 2020-03-04 DIAGNOSIS — G9332 Myalgic encephalomyelitis/chronic fatigue syndrome: Secondary | ICD-10-CM | POA: Insufficient documentation

## 2020-03-04 DIAGNOSIS — M5030 Other cervical disc degeneration, unspecified cervical region: Secondary | ICD-10-CM | POA: Insufficient documentation

## 2020-03-04 DIAGNOSIS — Z889 Allergy status to unspecified drugs, medicaments and biological substances status: Secondary | ICD-10-CM | POA: Insufficient documentation

## 2020-03-04 DIAGNOSIS — J45909 Unspecified asthma, uncomplicated: Secondary | ICD-10-CM | POA: Insufficient documentation

## 2020-03-04 DIAGNOSIS — F32A Depression, unspecified: Secondary | ICD-10-CM | POA: Insufficient documentation

## 2020-03-04 DIAGNOSIS — N950 Postmenopausal bleeding: Secondary | ICD-10-CM | POA: Diagnosis not present

## 2020-03-05 ENCOUNTER — Other Ambulatory Visit: Payer: Self-pay | Admitting: *Deleted

## 2020-03-05 DIAGNOSIS — E039 Hypothyroidism, unspecified: Secondary | ICD-10-CM

## 2020-03-05 DIAGNOSIS — Z23 Encounter for immunization: Secondary | ICD-10-CM | POA: Diagnosis not present

## 2020-03-06 ENCOUNTER — Other Ambulatory Visit: Payer: Self-pay | Admitting: Family Medicine

## 2020-03-06 DIAGNOSIS — R9389 Abnormal findings on diagnostic imaging of other specified body structures: Secondary | ICD-10-CM | POA: Insufficient documentation

## 2020-03-06 DIAGNOSIS — K760 Fatty (change of) liver, not elsewhere classified: Secondary | ICD-10-CM | POA: Insufficient documentation

## 2020-03-06 LAB — T4, FREE: Free T4: 1.47 ng/dL (ref 0.82–1.77)

## 2020-03-06 LAB — TSH: TSH: 1.49 u[IU]/mL (ref 0.450–4.500)

## 2020-03-17 ENCOUNTER — Ambulatory Visit (INDEPENDENT_AMBULATORY_CARE_PROVIDER_SITE_OTHER): Payer: PPO | Admitting: Family Medicine

## 2020-03-17 ENCOUNTER — Other Ambulatory Visit: Payer: Self-pay

## 2020-03-17 ENCOUNTER — Encounter: Payer: Self-pay | Admitting: Family Medicine

## 2020-03-17 VITALS — BP 130/86 | HR 70 | Temp 98.1°F | Wt 206.6 lb

## 2020-03-17 DIAGNOSIS — N289 Disorder of kidney and ureter, unspecified: Secondary | ICD-10-CM | POA: Diagnosis not present

## 2020-03-17 DIAGNOSIS — I10 Essential (primary) hypertension: Secondary | ICD-10-CM | POA: Diagnosis not present

## 2020-03-17 NOTE — Progress Notes (Signed)
° °  Subjective:    Patient ID: Kristen Mack, female    DOB: January 09, 1946, 74 y.o.   MRN: [id number]  HPI Patient is following up on hypertension after stopping Amlodipine, patient reports worsening leg swelling since stopping medication.  Hyperlipidemia - no issues with Rosuvastatin.  Follow-up on blood pressure.  Doing well with medication.  Will be following up with nephrology in addition to this tolerating cholesterol medicine 2 days/week not having any setbacks or problems currently.  Review of Systems Please see above.    Objective:   Physical Exam Lungs clear heart regular extremities no edema skin warm dry neurologic grossly normal       Assessment & Plan:  Nephrology will be seeing patient soon and they will send [place] update Blood pressure good control currently continue current measures Cholesterol tolerating medicine well follow-up in 3 months sooner if problems

## 2020-03-26 ENCOUNTER — Encounter: Payer: Self-pay | Admitting: Family Medicine

## 2020-03-26 DIAGNOSIS — N184 Chronic kidney disease, stage 4 (severe): Secondary | ICD-10-CM | POA: Diagnosis not present

## 2020-03-30 ENCOUNTER — Encounter: Payer: Self-pay | Admitting: Family Medicine

## 2020-04-07 ENCOUNTER — Other Ambulatory Visit: Payer: Self-pay | Admitting: Family Medicine

## 2020-04-14 ENCOUNTER — Ambulatory Visit: Payer: PPO | Admitting: Family Medicine

## 2020-04-17 ENCOUNTER — Encounter: Payer: Self-pay | Admitting: Family Medicine

## 2020-04-18 ENCOUNTER — Encounter: Payer: Self-pay | Admitting: Nurse Practitioner

## 2020-04-18 ENCOUNTER — Other Ambulatory Visit: Payer: Self-pay

## 2020-04-18 ENCOUNTER — Ambulatory Visit (INDEPENDENT_AMBULATORY_CARE_PROVIDER_SITE_OTHER): Payer: HMO | Admitting: Nurse Practitioner

## 2020-04-18 VITALS — BP 130/82 | HR 86 | Temp 97.6°F | Wt 205.0 lb

## 2020-04-18 DIAGNOSIS — M79642 Pain in left hand: Secondary | ICD-10-CM | POA: Diagnosis not present

## 2020-04-18 DIAGNOSIS — Z23 Encounter for immunization: Secondary | ICD-10-CM

## 2020-04-18 DIAGNOSIS — S61213A Laceration without foreign body of left middle finger without damage to nail, initial encounter: Secondary | ICD-10-CM | POA: Diagnosis not present

## 2020-04-18 DIAGNOSIS — W268XXA Contact with other sharp object(s), not elsewhere classified, initial encounter: Secondary | ICD-10-CM

## 2020-04-18 NOTE — Progress Notes (Signed)
   Subjective:    Patient ID: Kristen Mack, female    DOB: May 10, 1945, 75 y.o.   MRN: [id number]  HPI Patient comes in after cutting her left middle finger opening a food can last night.  Place a Band-Aid with some pressure on the area, no further bleeding. Requesting tetanus vaccine.  To her knowledge she has not had a Tdap vaccine.  Review of Systems     Objective:   Physical Exam NAD.  Alert, oriented.  A faint linear superficial laceration noted on the left middle finger edges well approximated no erythema or signs of infection.       Assessment & Plan:  Laceration of left middle finger without foreign body without damage to nail, initial encounter - Plan: Tdap vaccine greater than or equal to 7yo IM  Tdap given today per guidelines since she has not had one in the past. Reviewed wound care and warning signs of infection.  Call back if any problems.

## 2020-04-28 DIAGNOSIS — K7581 Nonalcoholic steatohepatitis (NASH): Secondary | ICD-10-CM | POA: Diagnosis not present

## 2020-04-28 DIAGNOSIS — E1122 Type 2 diabetes mellitus with diabetic chronic kidney disease: Secondary | ICD-10-CM | POA: Diagnosis not present

## 2020-04-28 DIAGNOSIS — K219 Gastro-esophageal reflux disease without esophagitis: Secondary | ICD-10-CM | POA: Diagnosis not present

## 2020-04-28 DIAGNOSIS — R300 Dysuria: Secondary | ICD-10-CM | POA: Diagnosis not present

## 2020-04-28 DIAGNOSIS — G4733 Obstructive sleep apnea (adult) (pediatric): Secondary | ICD-10-CM | POA: Diagnosis not present

## 2020-04-28 DIAGNOSIS — N1832 Chronic kidney disease, stage 3b: Secondary | ICD-10-CM | POA: Diagnosis not present

## 2020-04-28 DIAGNOSIS — I129 Hypertensive chronic kidney disease with stage 1 through stage 4 chronic kidney disease, or unspecified chronic kidney disease: Secondary | ICD-10-CM | POA: Diagnosis not present

## 2020-04-30 ENCOUNTER — Encounter: Payer: Self-pay | Admitting: Family Medicine

## 2020-04-30 ENCOUNTER — Other Ambulatory Visit: Payer: Self-pay | Admitting: Family Medicine

## 2020-04-30 MED ORDER — TIZANIDINE HCL 4 MG PO TABS: ORAL_TABLET | ORAL | 5 refills | Status: DC

## 2020-04-30 MED ORDER — TIZANIDINE HCL 4 MG PO CAPS
ORAL_CAPSULE | ORAL | 5 refills | Status: DC
Start: 2020-04-30 — End: 2020-04-30

## 2020-04-30 NOTE — Telephone Encounter (Signed)
Last med check up 03/17/20

## 2020-05-01 ENCOUNTER — Other Ambulatory Visit: Payer: Self-pay | Admitting: Family Medicine

## 2020-05-01 NOTE — Telephone Encounter (Signed)
Check up 02/13/20

## 2020-05-06 ENCOUNTER — Ambulatory Visit: Payer: PPO | Admitting: Cardiovascular Disease

## 2020-06-02 ENCOUNTER — Telehealth: Payer: Self-pay

## 2020-06-02 NOTE — Telephone Encounter (Signed)
Kristen Mack from [place] called to report a fall on [place] on 02/23 she said that her being a retired [employer] scientist she did not feel need to report she did hit her head but was conscious and no injury or no headache.  If need to talk to Kristen Mack you can call [phone]

## 2020-06-02 NOTE — Telephone Encounter (Signed)
Noted thank you

## 2020-06-10 ENCOUNTER — Other Ambulatory Visit: Payer: Self-pay | Admitting: Family Medicine

## 2020-06-17 ENCOUNTER — Encounter: Payer: Self-pay | Admitting: Family Medicine

## 2020-06-17 DIAGNOSIS — Z79899 Other long term (current) drug therapy: Secondary | ICD-10-CM

## 2020-06-17 DIAGNOSIS — E1169 Type 2 diabetes mellitus with other specified complication: Secondary | ICD-10-CM

## 2020-06-17 DIAGNOSIS — I10 Essential (primary) hypertension: Secondary | ICD-10-CM

## 2020-06-17 DIAGNOSIS — E785 Hyperlipidemia, unspecified: Secondary | ICD-10-CM

## 2020-06-17 DIAGNOSIS — E119 Type 2 diabetes mellitus without complications: Secondary | ICD-10-CM

## 2020-06-18 NOTE — Telephone Encounter (Signed)
Met 7, lipid, liver, A1c, urine ACR, lipase  When the patient follows up we will do further evaluation and documentation in order to allow [place] to set up the MRI of the pancreas thank you

## 2020-06-18 NOTE — Addendum Note (Signed)
Addended by: Vicente Males on: 06/18/2020 08:22 AM   Modules accepted: Orders

## 2020-06-25 DIAGNOSIS — E785 Hyperlipidemia, unspecified: Secondary | ICD-10-CM | POA: Diagnosis not present

## 2020-06-25 DIAGNOSIS — E1169 Type 2 diabetes mellitus with other specified complication: Secondary | ICD-10-CM | POA: Diagnosis not present

## 2020-06-25 DIAGNOSIS — E119 Type 2 diabetes mellitus without complications: Secondary | ICD-10-CM | POA: Diagnosis not present

## 2020-06-25 DIAGNOSIS — Z79899 Other long term (current) drug therapy: Secondary | ICD-10-CM | POA: Diagnosis not present

## 2020-06-25 DIAGNOSIS — I10 Essential (primary) hypertension: Secondary | ICD-10-CM | POA: Diagnosis not present

## 2020-06-26 LAB — HEPATIC FUNCTION PANEL
ALT: 20 IU/L (ref 0–32)
AST: 19 IU/L (ref 0–40)
Albumin: 4.1 g/dL (ref 3.7–4.7)
Alkaline Phosphatase: 86 IU/L (ref 44–121)
Bilirubin Total: 0.3 mg/dL (ref 0.0–1.2)
Bilirubin, Direct: 0.11 mg/dL (ref 0.00–0.40)
Total Protein: 7.4 g/dL (ref 6.0–8.5)

## 2020-06-26 LAB — BASIC METABOLIC PANEL
BUN/Creatinine Ratio: 17 (ref 12–28)
BUN: 28 mg/dL — ABNORMAL HIGH (ref 8–27)
CO2: 16 mmol/L — ABNORMAL LOW (ref 20–29)
Calcium: 9.4 mg/dL (ref 8.7–10.3)
Chloride: 106 mmol/L (ref 96–106)
Creatinine, Ser: 1.61 mg/dL — ABNORMAL HIGH (ref 0.57–1.00)
Glucose: 135 mg/dL — ABNORMAL HIGH (ref 65–99)
Potassium: 4.9 mmol/L (ref 3.5–5.2)
Sodium: 140 mmol/L (ref 134–144)
eGFR: 33 mL/min/{1.73_m2} — ABNORMAL LOW (ref >59)

## 2020-06-26 LAB — LIPID PANEL
Chol/HDL Ratio: 3.6 ratio (ref 0.0–4.4)
Cholesterol, Total: 155 mg/dL (ref 100–199)
HDL: 43 mg/dL (ref >39)
LDL Chol Calc (NIH): 86 mg/dL (ref 0–99)
Triglycerides: 151 mg/dL — ABNORMAL HIGH (ref 0–149)
VLDL Cholesterol Cal: 26 mg/dL (ref 5–40)

## 2020-06-26 LAB — HEMOGLOBIN A1C
Est. average glucose Bld gHb Est-mCnc: 143 mg/dL
Hgb A1c MFr Bld: 6.6 % — ABNORMAL HIGH (ref 4.8–5.6)

## 2020-06-26 LAB — MICROALBUMIN / CREATININE URINE RATIO
Creatinine, Urine: 58 mg/dL
Microalb/Creat Ratio: 36 mg/g creat — ABNORMAL HIGH (ref 0–29)
Microalbumin, Urine: 21 ug/mL

## 2020-06-26 LAB — LIPASE: Lipase: 56 U/L (ref 14–85)

## 2020-06-30 ENCOUNTER — Ambulatory Visit (INDEPENDENT_AMBULATORY_CARE_PROVIDER_SITE_OTHER): Payer: HMO | Admitting: Family Medicine

## 2020-06-30 ENCOUNTER — Encounter: Payer: Self-pay | Admitting: Family Medicine

## 2020-06-30 ENCOUNTER — Other Ambulatory Visit: Payer: Self-pay | Admitting: Family Medicine

## 2020-06-30 ENCOUNTER — Other Ambulatory Visit: Payer: Self-pay

## 2020-06-30 VITALS — BP 138/84 | Temp 97.3°F | Wt 209.4 lb

## 2020-06-30 DIAGNOSIS — R296 Repeated falls: Secondary | ICD-10-CM

## 2020-06-30 DIAGNOSIS — I129 Hypertensive chronic kidney disease with stage 1 through stage 4 chronic kidney disease, or unspecified chronic kidney disease: Secondary | ICD-10-CM | POA: Diagnosis not present

## 2020-06-30 DIAGNOSIS — I10 Essential (primary) hypertension: Secondary | ICD-10-CM | POA: Diagnosis not present

## 2020-06-30 DIAGNOSIS — D699 Hemorrhagic condition, unspecified: Secondary | ICD-10-CM | POA: Diagnosis not present

## 2020-06-30 DIAGNOSIS — E1169 Type 2 diabetes mellitus with other specified complication: Secondary | ICD-10-CM

## 2020-06-30 DIAGNOSIS — N183 Chronic kidney disease, stage 3 unspecified: Secondary | ICD-10-CM | POA: Diagnosis not present

## 2020-06-30 DIAGNOSIS — R233 Spontaneous ecchymoses: Secondary | ICD-10-CM | POA: Insufficient documentation

## 2020-06-30 DIAGNOSIS — E1122 Type 2 diabetes mellitus with diabetic chronic kidney disease: Secondary | ICD-10-CM | POA: Diagnosis not present

## 2020-06-30 DIAGNOSIS — E785 Hyperlipidemia, unspecified: Secondary | ICD-10-CM

## 2020-06-30 DIAGNOSIS — M810 Age-related osteoporosis without current pathological fracture: Secondary | ICD-10-CM

## 2020-06-30 DIAGNOSIS — K862 Cyst of pancreas: Secondary | ICD-10-CM | POA: Diagnosis not present

## 2020-06-30 DIAGNOSIS — E038 Other specified hypothyroidism: Secondary | ICD-10-CM

## 2020-06-30 NOTE — Progress Notes (Signed)
Subjective:    Patient ID: Kristen Mack, female    DOB: 1945/09/09, 75 y.o.   MRN: [id number]  HPI Pt here for 6 month follow. Pt has had 2 falls in the past 6 weeks. One was outside on wet grass while taking garbage can to road. Landed on right wrist and hit grass fairly hard. Did have bump on head.  Director of free clinic was riding by and stopped to help.   Most recent fall was last Friday while bringing in groceries. Hard hit to head on carpet with fall also. Left knee and hip pain when putting weight on left knee. Pt states she had no confusion, dizziness or headache.   Pt states MRI Pancreas was not done. Pt states that provider was going to discuss this at next visit.  Results for orders placed or performed in visit on 40/98/11  Basic Metabolic Panel (BMET)  Result Value Ref Range   Glucose 135 (H) 65 - 99 mg/dL   BUN 28 (H) 8 - 27 mg/dL   Creatinine, Ser 1.61 (H) 0.57 - 1.00 mg/dL   eGFR 33 (L) >59 mL/min/1.73   BUN/Creatinine Ratio 17 12 - 28   Sodium 140 134 - 144 mmol/L   Potassium 4.9 3.5 - 5.2 mmol/L   Chloride 106 96 - 106 mmol/L   CO2 16 (L) 20 - 29 mmol/L   Calcium 9.4 8.7 - 10.3 mg/dL  Lipid Profile  Result Value Ref Range   Cholesterol, Total 155 100 - 199 mg/dL   Triglycerides 151 (H) 0 - 149 mg/dL   HDL 43 >39 mg/dL   VLDL Cholesterol Cal 26 5 - 40 mg/dL   LDL Chol Calc (NIH) 86 0 - 99 mg/dL   Chol/HDL Ratio 3.6 0.0 - 4.4 ratio  Hepatic function panel  Result Value Ref Range   Total Protein 7.4 6.0 - 8.5 g/dL   Albumin 4.1 3.7 - 4.7 g/dL   Bilirubin Total 0.3 0.0 - 1.2 mg/dL   Bilirubin, Direct 0.11 0.00 - 0.40 mg/dL   Alkaline Phosphatase 86 44 - 121 IU/L   AST 19 0 - 40 IU/L   ALT 20 0 - 32 IU/L  Hemoglobin A1c  Result Value Ref Range   Hgb A1c MFr Bld 6.6 (H) 4.8 - 5.6 %   Est. average glucose Bld gHb Est-mCnc 143 mg/dL  Urine Microalbumin w/creat. ratio  Result Value Ref Range   Creatinine, Urine 58.0 Not Estab. mg/dL   Microalbumin,  Urine 21.0 Not Estab. ug/mL   Microalb/Creat Ratio 36 (H) 0 - 29 mg/g creat  Lipase  Result Value Ref Range   Lipase 56 14 - 85 U/L    Review of Systems  Constitutional: Negative for activity change and appetite change.  HENT: Negative for congestion and rhinorrhea.   Respiratory: Negative for cough and shortness of breath.   Cardiovascular: Negative for chest pain and leg swelling.  Gastrointestinal: Negative for abdominal pain, nausea and vomiting.  Musculoskeletal: Positive for arthralgias and back pain.  Skin: Negative for color change.  Neurological: Negative for dizziness and weakness.  Psychiatric/Behavioral: Negative for agitation and confusion.       Objective:   Physical Exam Vitals reviewed.  Constitutional:      General: She is not in acute distress. HENT:     Head: Normocephalic.  Cardiovascular:     Rate and Rhythm: Normal rate and regular rhythm.     Heart sounds: Normal heart sounds. No murmur heard.  Pulmonary:     Effort: Pulmonary effort is normal.     Breath sounds: Normal breath sounds.  Lymphadenopathy:     Cervical: No cervical adenopathy.  Neurological:     Mental Status: She is alert.  Psychiatric:        Behavior: Behavior normal.      She was observed walking with a walker she does a good job with this but she does not pick her feet up and She was also walking without a walker and a couple times she wavered and had to hold onto the wall I have encouraged her to use a walker in all circumstances but when not possible use a cane      Assessment & Plan:  1. Pancreatic cyst Does need MRI follow-up.  Pancreatic cyst protocol.  Based upon abnormal MRI of the pancreas 6 months ago  2. HTN (hypertension), benign Blood pressure good control continue current measures  3. Other specified hypothyroidism Continue thyroid medicine  4. Hyperlipidemia associated with type 2 diabetes mellitus (HCC) Continue statin can only tolerate 2 days/week  taking co-Q10  5. Osteoporosis without current pathological fracture, unspecified osteoporosis type Bone density ordered  6. Bruising tendency (HCC) Some bruising on the arms no severe findings  7. Morbid obesity ([place]) Portion control regular activity  8. Type 2 DM with CKD stage 3 and hypertension ([place]) Diabetes under good control with Januvia Consider SGLT2 if worsening control  9. Frequent falls Gait techniques discussed balance techniques discussed  Follow-up 3 months

## 2020-07-02 ENCOUNTER — Other Ambulatory Visit: Payer: Self-pay

## 2020-07-02 ENCOUNTER — Ambulatory Visit (HOSPITAL_COMMUNITY)
Admission: RE | Admit: 2020-07-02 | Discharge: 2020-07-02 | Disposition: A | Discharge status: Home / Self Care | Payer: HMO | Source: Ambulatory Visit | Attending: Family Medicine | Admitting: Family Medicine

## 2020-07-02 DIAGNOSIS — M810 Age-related osteoporosis without current pathological fracture: Secondary | ICD-10-CM | POA: Insufficient documentation

## 2020-07-06 NOTE — Progress Notes (Signed)
Cardiology Office Note:   Date:  07/08/2020  NAME:  Kristen Mack    MRN: [id number] DOB:  28-Aug-1945   PCP:  Kathyrn Drown, MD  Cardiologist:  No primary care provider on file.   Referring MD: Kathyrn Drown, MD   Chief Complaint  Patient presents with  . Follow-up   History of Present Illness:   Kristen Mack is a 75 y.o. female with a hx of DM, hypothyroidism, CKD 3, OSA, who presents for follow-up. Seen in November for bradycardia. Found to have PVCs. Monitor showed 22% burden. Echo normal. Was actually hyperthyroid. This has been adjusted. She reports she is feeling well.  Not noticed any rapid heartbeat sensation.  Not noticing her PVCs.  EKG shows they are happening less frequently.  Examination also confirms this.  She does have sleep apnea.  I think she needs to have this revisited.  She is excessively tired and fatigued.  She is having PVCs.  She will reach out to her sleep medicine physician.  She also has had recent thyroid medication adjustment.  Appears to be doing well with this.  Blood pressure is 120/70.  Well controlled.  She reports no issues with this.  She is not active due to arthritis as well as limited mobility.  She is surprisingly doing well.  Her most recent lipid profile shows her cholesterol has improved.  Her diabetes has improved as well.  We did discuss her PVCs in office.  LV function is normal.  No indications for treatment.  She is having no symptoms from these.  Given her CKD I think less aggressive approach is warranted.  She is in agreement.  Problem List 1. Diabetes -A1c 6.6 -T chol 155, HLD 43, LDL 86, TG 151 2. Hypothyroidism  3. CKD 3 4. CVA 5. OSA 6. PVCs  -22% burden  Past Medical History: Past Medical History:  Diagnosis Date  . Arthritis   . Asthma   . CFS (chronic fatigue syndrome)   . Cracked tooth    X2  RIGHT LOWER MOLAR AND LEFT UPPER MOLAR  . Fatty liver disease, nonalcoholic   . Fibromyalgia   . GERD  (gastroesophageal reflux disease)   . History of adenomatous polyp of colon   . History of cardiac murmur    until age 29  . History of TIA (transient ischemic attack)    per CT scan   . Hypertension   . Hypothyroidism   . IBS (irritable bowel syndrome)   . Meniere disease   . OSA (obstructive sleep apnea)    CPAP intolerate -- per study 2010  moderate osa--  STATES HAS RAISED HOB  . Osteopenia   . PMB (postmenopausal bleeding)   . Prediabetes   . Sleep apnea 07/11/2017   Abnormal home sleep apnea test February 2019-CPAP ordered  . Tooth loose    LOWER FRONT    Past Surgical History: Past Surgical History:  Procedure Laterality Date  . [place]  . CHOLECYSTECTOMY  1995   and  REPAIR UMBILICAL HERNIA  . COLONOSCOPY N/A 04/19/2013   Procedure: COLONOSCOPY;  Surgeon: Rogene Houston, MD;  Location: [facility];  Service: Endoscopy;  Laterality: N/A;    . ESOPHAGOGASTRODUODENOSCOPY  02-08-2003  . HYSTEROSCOPY WITH D & C  11-14-2002   endometrial polypectomy  . HYSTEROSCOPY WITH D & C N/A 04/05/2014   Procedure: DILATATION AND CURETTAGE /HYSTEROSCOPY, ENDOMETRIAL POLYPECTOMY;  Surgeon: Sanjuana Kava, MD;  Location: [facility];  Service: Gynecology;  Laterality: N/A;  . LIVER BIOPSY  01-29-2004   benign    Current Medications: Current Meds  Medication Sig  . albuterol (VENTOLIN HFA) 108 (90 Base) MCG/ACT inhaler INHALE 2 PUFFS INTO LUNGS EVERY 6 HOURS AS NEEDED FOR SHORTNESS OF BREATH  . azelastine (ASTELIN) 0.1 % nasal spray Place 2 sprays into both nostrils 2 (two) times daily.  . B Complex Vitamins (B COMPLEX 100 PO) Take 1 tablet by mouth daily.  Marland Kitchen buPROPion (WELLBUTRIN XL) 300 MG 24 hr tablet Take 1 tablet by mouth once daily  . Chromium 1000 MCG TABS Take 1 tablet by mouth daily.   . Coenzyme Q10 (COQ10) 200 MG CAPS Take 1 capsule by mouth daily.  . Cyanocobalamin (B-12) 1000 MCG SUBL Place 1 tablet under the tongue daily.   Marland Kitchen  DIGESTIVE ENZYMES PO Take 1 tablet by mouth 3 (three) times daily.   Marland Kitchen diltiazem (CARDIZEM CD) 120 MG 24 hr capsule Take 1 capsule (120 mg total) by mouth daily.  Marland Kitchen glucose blood (ONETOUCH VERIO) test strip Test once daily  . ipratropium (ATROVENT) 0.02 % nebulizer solution USE ONE VIAL IN NEBULIZER 4 TIMES DAILY AS NEEDED FOR SHORTNESS OF BREATH  . JANUVIA 100 MG tablet Take 1 tablet by mouth once daily  . levothyroxine (SYNTHROID) 112 MCG tablet Take 1 tablet (112 mcg total) by mouth daily.  Marland Kitchen Lysine HCl 500 MG TABS Take 1 tablet by mouth daily. Take as needed  . Magnesium 250 MG TABS Take by mouth 2 (two) times daily.  . montelukast (SINGULAIR) 10 MG tablet TAKE 1 TABLET BY MOUTH AT BEDTIME  . nystatin ointment (MYCOSTATIN) Apply 1 application topically 2 (two) times daily.  Marland Kitchen OVER THE COUNTER MEDICATION Fennel seeds. One tsp with meals  . OVER THE COUNTER MEDICATION Tart cherry extract  . OVER THE COUNTER MEDICATION Sleep essentials  . rosuvastatin (CRESTOR) 5 MG tablet Take 1 tablet on Monday and 1 tablet on friday  . spironolactone (ALDACTONE) 25 MG tablet Take 1 tablet (25 mg total) by mouth daily.  Marland Kitchen tiZANidine (ZANAFLEX) 4 MG tablet 1 qhs prn caution drowsiness  . VITAMIN A PO Take 10,000 mcg by mouth daily.   . vitamin C (ASCORBIC ACID) 500 MG tablet Take 6,000 mg by mouth daily.   . vitamin E 100 UNIT capsule Take 800 Units by mouth daily.   . [DISCONTINUED] meclizine (ANTIVERT) 50 MG tablet Take 50 mg by mouth daily as needed for dizziness. Take as needed     Allergies:    Ambien [zolpidem tartrate], Cefzil [cefprozil], Codeine, Demerol [meperidine], Formaldehyde, Latex, Sulfa antibiotics, and Celexa [citalopram hydrobromide]   Social History: Social History   Socioeconomic History  . Marital status: Married    Spouse name: Not on file  . Number of children: 2  . Years of education: Not on file  . Highest education level: Not on file  Occupational History  . Occupation:  retired, Therapist, sports  Tobacco Use  . Smoking status: Former Smoker    Packs/day: 0.50    Years: 3.00    Pack years: 1.50    Types: Cigarettes    Quit date: 10/03/1968    Years since quitting: 51.7  . Smokeless tobacco: Never Used  Substance and Sexual Activity  . Alcohol use: No    Alcohol/week: 0.0 standard drinks  . Drug use: No  . Sexual activity: Not on file  Other Topics Concern  . Not on  file  Social History Narrative  . Not on file   Social Determinants of Health   Financial Resource Strain: Not on file  Food Insecurity: Not on file  Transportation Needs: Not on file  Physical Activity: Not on file  Stress: Not on file  Social Connections: Not on file     Family History: The patient's family history includes Breast cancer in her maternal aunt and paternal aunt; Diabetes in her father and mother; Heart disease in her paternal grandfather; Hypertension in her father and mother; Liver disease in her sister; Ovarian cancer (age of onset: 41) in her mother; Rectal cancer (age of onset: 55) in her brother. There is no history of Pancreatic cancer or Esophageal cancer.  ROS:   All other ROS reviewed and negative. Pertinent positives noted in the HPI.     EKGs/Labs/Other Studies Reviewed:   The following studies were personally reviewed by me today:  EKG:  EKG is ordered today.  The ekg ordered today demonstrates sinus rhythm, heart rate 62, PVCs noted, and was personally reviewed by me.   [place] 01/30/2020 1. Frequent PVCs of unifocal origin (22% burden).  2. No significant arrhythmias.   TTE 02/01/2020 1. Frequent ectopy affects interpretation of LV systolic function. Left  ventricular ejection fraction, by estimation, is 55 to 60%. The left  ventricle has normal function. The left ventricle has no regional wall  motion abnormalities. There is moderate  left ventricular hypertrophy.  2. Right ventricular systolic function is normal. The right ventricular  size is normal.  3.  Limited echo to evaluate LV function   Recent Labs: 07/30/2019: Magnesium 2.2 01/28/2020: BNP 255.7 03/05/2020: TSH 1.490 06/25/2020: ALT 20; BUN 28; Creatinine, Ser 1.61; Potassium 4.9; Sodium 140   Recent Lipid Panel    Component Value Date/Time   CHOL 155 06/25/2020 1144   TRIG 151 (H) 06/25/2020 1144   HDL 43 06/25/2020 1144   CHOLHDL 3.6 06/25/2020 1144   CHOLHDL 3.3 05/07/2014 1122   VLDL 24 05/07/2014 1122   LDLCALC 86 06/25/2020 1144    Physical Exam:   VS:  BP 120/70   Pulse 62   Ht 5' 1.5" (1.562 m)   Wt 206 lb (93.4 kg)   SpO2 96%   BMI 38.29 kg/m    Wt Readings from Last 3 Encounters:  07/08/20 206 lb (93.4 kg)  06/30/20 209 lb 6.4 oz (95 kg)  04/18/20 205 lb (93 kg)    General: Well nourished, well developed, in no acute distress Head: Atraumatic, normal size  Eyes: PEERLA, EOMI  Neck: Supple, no JVD Endocrine: No thryomegaly Cardiac: Normal S1, S2; RRR; no murmurs, rubs, or gallops Lungs: Clear to auscultation bilaterally, no wheezing, rhonchi or rales  Abd: Soft, nontender, no hepatomegaly  Ext: No edema, pulses 2+ Musculoskeletal: No deformities, BUE and BLE strength normal and equal Skin: Warm and dry, no rashes   Neuro: Alert and oriented to person, place, time, and situation, CNII-XII grossly intact, no focal deficits  Psych: Normal mood and affect   ASSESSMENT:   Kristen Mack is a 75 y.o. female who presents for the following: 1. PVC (premature ventricular contraction)   2. Fatigue, unspecified type   3. SOB (shortness of breath)   4. Mixed hyperlipidemia     PLAN:   1. PVC (premature ventricular contraction) 2. Fatigue, unspecified type 3. SOB (shortness of breath) -Seen for bradycardia.  Found to have PVCs.  22% burden.  LV function is normal.  We did  start her on diltiazem.  She is having less ectopy on examination and EKG today.  We did discuss the etiology of her PVCs may have been her hyper thyroid state.  This has been  corrected.  She also was hypokalemic when I saw her.  This has been corrected.  I also believe her PVCs could be coming from worsening sleep apnea.  I have asked her to reach out her sleep medicine physician for possible titration of her CPAP machine.  Her blood pressure is well controlled.  She is doing well on diltiazem.  She has no indications for PVC ablation.  She also informs me that she not indicated in aggressive cardiovascular care.  She will continue with a more conservative approach.  For now we will continue her Cardizem.  I will see her back in 6 months.  She overall is doing well.  4. Mixed hyperlipidemia -She is diabetic.  She is on Crestor on Mondays and Fridays.  Her most recent LDL is 86.  Given how tenuous she is on medications I think this is acceptable.  Disposition: Return in about 6 months (around 01/07/2021).  Medication Adjustments/Labs and Tests Ordered: Current medicines are reviewed at length with the patient today.  Concerns regarding medicines are outlined above.  Orders Placed This Encounter  Procedures  . EKG 12-Lead   No orders of the defined types were placed in this encounter.   Patient Instructions  Medication Instructions:  The current medical regimen is effective;  continue present plan and medications.  *If you need a refill on your cardiac medications before your next appointment, please call your pharmacy*   Follow-Up: At [facility], you and your health needs are our priority.  As part of our continuing mission to provide you with exceptional heart care, we have created designated Provider Care Teams.  These Care Teams include your primary Cardiologist (physician) and Advanced Practice Providers (APPs -  Physician Assistants and Nurse Practitioners) who all work together to provide you with the care you need, when you need it.  We recommend signing up for the patient portal called "MyChart".  Sign up information is provided on this After Visit  Summary.  MyChart is used to connect with patients for Virtual Visits (Telemedicine).  Patients are able to view lab/test results, encounter notes, upcoming appointments, etc.  Non-urgent messages can be sent to your provider as well.   To learn more about what you can do with MyChart, go to [email or web address].    Your next appointment:   6 month(s)  The format for your next appointment:   In Person  Provider:   Eleonore Chiquito, MD         Time Spent with Patient: I have spent a total of 25 minutes with patient reviewing hospital notes, telemetry, EKGs, labs and examining the patient as well as establishing an assessment and plan that was discussed with the patient.  > 50% of time was spent in direct patient care.  Signed, Addison Naegeli. Audie Box, MD, [place]  [address] [place], [place] [address] [phone]  07/08/2020 3:47 PM

## 2020-07-08 ENCOUNTER — Other Ambulatory Visit: Payer: Self-pay

## 2020-07-08 ENCOUNTER — Ambulatory Visit (INDEPENDENT_AMBULATORY_CARE_PROVIDER_SITE_OTHER): Payer: HMO | Admitting: Cardiovascular Disease

## 2020-07-08 ENCOUNTER — Encounter: Payer: Self-pay | Admitting: Cardiovascular Disease

## 2020-07-08 VITALS — BP 120/70 | HR 62 | Ht 61.5 in | Wt 206.0 lb

## 2020-07-08 DIAGNOSIS — I493 Ventricular premature depolarization: Secondary | ICD-10-CM | POA: Diagnosis not present

## 2020-07-08 DIAGNOSIS — R5383 Other fatigue: Secondary | ICD-10-CM | POA: Diagnosis not present

## 2020-07-08 DIAGNOSIS — R0602 Shortness of breath: Secondary | ICD-10-CM | POA: Diagnosis not present

## 2020-07-08 DIAGNOSIS — E782 Mixed hyperlipidemia: Secondary | ICD-10-CM | POA: Diagnosis not present

## 2020-07-08 NOTE — Patient Instructions (Signed)
Medication Instructions:  The current medical regimen is effective;  continue present plan and medications.  *If you need a refill on your cardiac medications before your next appointment, please call your pharmacy*   Follow-Up: At [facility], you and your health needs are our priority.  As part of our continuing mission to provide you with exceptional heart care, we have created designated Provider Care Teams.  These Care Teams include your primary Cardiologist (physician) and Advanced Practice Providers (APPs -  Physician Assistants and Nurse Practitioners) who all work together to provide you with the care you need, when you need it.  We recommend signing up for the patient portal called "MyChart".  Sign up information is provided on this After Visit Summary.  MyChart is used to connect with patients for Virtual Visits (Telemedicine).  Patients are able to view lab/test results, encounter notes, upcoming appointments, etc.  Non-urgent messages can be sent to your provider as well.   To learn more about what you can do with MyChart, go to [email or web address].    Your next appointment:   6 month(s)  The format for your next appointment:   In Person  Provider:   Eleonore Chiquito, MD

## 2020-07-09 DIAGNOSIS — H2513 Age-related nuclear cataract, bilateral: Secondary | ICD-10-CM | POA: Diagnosis not present

## 2020-07-09 LAB — HM DIABETES EYE EXAM

## 2020-07-15 ENCOUNTER — Telehealth: Payer: Self-pay

## 2020-07-15 ENCOUNTER — Ambulatory Visit (HOSPITAL_COMMUNITY)
Admission: RE | Admit: 2020-07-15 | Discharge: 2020-07-15 | Disposition: A | Discharge status: Home / Self Care | Payer: HMO | Source: Ambulatory Visit | Attending: Family Medicine | Admitting: Family Medicine

## 2020-07-15 ENCOUNTER — Other Ambulatory Visit: Payer: Self-pay | Admitting: Family Medicine

## 2020-07-15 ENCOUNTER — Other Ambulatory Visit: Payer: Self-pay

## 2020-07-15 DIAGNOSIS — K7689 Other specified diseases of liver: Secondary | ICD-10-CM | POA: Diagnosis not present

## 2020-07-15 DIAGNOSIS — K862 Cyst of pancreas: Secondary | ICD-10-CM

## 2020-07-15 MED ORDER — GADOBUTROL 1 MMOL/ML IV SOLN
10.0000 mL | Freq: Once | INTRAVENOUS | Status: AC | PRN
  Administered 2020-07-15: 10 mL via INTRAVENOUS

## 2020-07-16 NOTE — Telephone Encounter (Signed)
Pt states she has appt with gyn

## 2020-07-29 DIAGNOSIS — E038 Other specified hypothyroidism: Secondary | ICD-10-CM | POA: Diagnosis not present

## 2020-07-29 DIAGNOSIS — E782 Mixed hyperlipidemia: Secondary | ICD-10-CM | POA: Diagnosis not present

## 2020-07-30 LAB — T4, FREE: Free T4: 1.02 ng/dL (ref 0.82–1.77)

## 2020-07-30 LAB — LIPID PANEL
Chol/HDL Ratio: 3.6 ratio (ref 0.0–4.4)
Cholesterol, Total: 160 mg/dL (ref 100–199)
HDL: 44 mg/dL (ref >39)
LDL Chol Calc (NIH): 90 mg/dL (ref 0–99)
Triglycerides: 147 mg/dL (ref 0–149)
VLDL Cholesterol Cal: 26 mg/dL (ref 5–40)

## 2020-07-30 LAB — TSH: TSH: 4.33 u[IU]/mL (ref 0.450–4.500)

## 2020-08-04 DIAGNOSIS — R9389 Abnormal findings on diagnostic imaging of other specified body structures: Secondary | ICD-10-CM | POA: Diagnosis not present

## 2020-08-04 DIAGNOSIS — N8110 Cystocele, unspecified: Secondary | ICD-10-CM | POA: Diagnosis not present

## 2020-08-04 DIAGNOSIS — R32 Unspecified urinary incontinence: Secondary | ICD-10-CM | POA: Diagnosis not present

## 2020-08-04 DIAGNOSIS — Z87448 Personal history of other diseases of urinary system: Secondary | ICD-10-CM | POA: Insufficient documentation

## 2020-08-04 DIAGNOSIS — Z1231 Encounter for screening mammogram for malignant neoplasm of breast: Secondary | ICD-10-CM | POA: Diagnosis not present

## 2020-08-06 ENCOUNTER — Other Ambulatory Visit: Payer: Self-pay

## 2020-08-06 ENCOUNTER — Ambulatory Visit (INDEPENDENT_AMBULATORY_CARE_PROVIDER_SITE_OTHER): Payer: HMO | Admitting: "Endocrinology

## 2020-08-06 ENCOUNTER — Encounter: Payer: Self-pay | Admitting: "Endocrinology

## 2020-08-06 VITALS — BP 134/88 | HR 64 | Ht 61.5 in | Wt 215.0 lb

## 2020-08-06 DIAGNOSIS — E119 Type 2 diabetes mellitus without complications: Secondary | ICD-10-CM

## 2020-08-06 DIAGNOSIS — E038 Other specified hypothyroidism: Secondary | ICD-10-CM | POA: Diagnosis not present

## 2020-08-06 DIAGNOSIS — D3502 Benign neoplasm of left adrenal gland: Secondary | ICD-10-CM | POA: Diagnosis not present

## 2020-08-06 NOTE — Progress Notes (Signed)
08/06/2020, 4:02 PM     Endocrinology follow-up note   Subjective:    Patient ID: Kristen Mack, female    DOB: [birth year], PCP Kathyrn Drown, MD   Past Medical History:  Diagnosis Date  . Arthritis   . Asthma   . CFS (chronic fatigue syndrome)   . Cracked tooth    X2  RIGHT LOWER MOLAR AND LEFT UPPER MOLAR  . Fatty liver disease, nonalcoholic   . Fibromyalgia   . GERD (gastroesophageal reflux disease)   . History of adenomatous polyp of colon   . History of cardiac murmur    until age 85  . History of TIA (transient ischemic attack)    per CT scan   . Hypertension   . Hypothyroidism   . IBS (irritable bowel syndrome)   . Meniere disease   . OSA (obstructive sleep apnea)    CPAP intolerate -- per study 2010  moderate osa--  STATES HAS RAISED HOB  . Osteopenia   . PMB (postmenopausal bleeding)   . Prediabetes   . Sleep apnea 07/11/2017   Abnormal home sleep apnea test February 2019-CPAP ordered  . Tooth loose    LOWER FRONT   Past Surgical History:  Procedure Laterality Date  . [place]  . CHOLECYSTECTOMY  1995   and  REPAIR UMBILICAL HERNIA  . COLONOSCOPY N/A 04/19/2013   Procedure: COLONOSCOPY;  Surgeon: Rogene Houston, MD;  Location: [facility];  Service: Endoscopy;  Laterality: N/A;    . ESOPHAGOGASTRODUODENOSCOPY  02-08-2003  . HYSTEROSCOPY WITH D & C  11-14-2002   endometrial polypectomy  . HYSTEROSCOPY WITH D & C N/A 04/05/2014   Procedure: DILATATION AND CURETTAGE /HYSTEROSCOPY, ENDOMETRIAL POLYPECTOMY;  Surgeon: Sanjuana Kava, MD;  Location: [place];  Service: Gynecology;  Laterality: N/A;  . LIVER BIOPSY  01-29-2004   benign   Social History   Socioeconomic History  . Marital status: Married    Spouse name: Not on file  . Number of children: 2  . Years of education: Not on file  . Highest education level: Not on file  Occupational History  .  Occupation: retired, Therapist, sports  Tobacco Use  . Smoking status: Former Smoker    Packs/day: 0.50    Years: 3.00    Pack years: 1.50    Types: Cigarettes    Quit date: 10/03/1968    Years since quitting: 51.8  . Smokeless tobacco: Never Used  Substance and Sexual Activity  . Alcohol use: No    Alcohol/week: 0.0 standard drinks  . Drug use: No  . Sexual activity: Not on file  Other Topics Concern  . Not on file  Social History Narrative  . Not on file   Social Determinants of Health   Financial Resource Strain: Not on file  Food Insecurity: Not on file  Transportation Needs: Not on file  Physical Activity: Not on file  Stress: Not on file  Social Connections: Not on file   Outpatient Encounter Medications as of 08/06/2020  Medication Sig  . albuterol (VENTOLIN HFA) 108 (90 Base) MCG/ACT inhaler INHALE 2 PUFFS INTO LUNGS EVERY 6 HOURS AS NEEDED FOR SHORTNESS OF  BREATH  . azelastine (ASTELIN) 0.1 % nasal spray Place 2 sprays into both nostrils 2 (two) times daily.  . B Complex Vitamins (B COMPLEX 100 PO) Take 1 tablet by mouth daily.  Marland Kitchen buPROPion (WELLBUTRIN XL) 300 MG 24 hr tablet Take 1 tablet by mouth once daily  . Chromium 1000 MCG TABS Take 1 tablet by mouth daily.   . Coenzyme Q10 (COQ10) 200 MG CAPS Take 1 capsule by mouth daily.  . Cyanocobalamin (B-12) 1000 MCG SUBL Place 1 tablet under the tongue daily.   Marland Kitchen DIGESTIVE ENZYMES PO Take 1 tablet by mouth 3 (three) times daily.   Marland Kitchen diltiazem (CARDIZEM CD) 120 MG 24 hr capsule Take 1 capsule (120 mg total) by mouth daily.  Marland Kitchen glucose blood (ONETOUCH VERIO) test strip Test once daily  . ipratropium (ATROVENT) 0.02 % nebulizer solution USE ONE VIAL IN NEBULIZER 4 TIMES DAILY AS NEEDED FOR SHORTNESS OF BREATH  . JANUVIA 100 MG tablet Take 1 tablet by mouth once daily  . levothyroxine (SYNTHROID) 112 MCG tablet Take 1 tablet (112 mcg total) by mouth daily.  Marland Kitchen Lysine HCl 500 MG TABS Take 1 tablet by mouth daily. Take as needed  .  Magnesium 250 MG TABS Take by mouth 2 (two) times daily.  . montelukast (SINGULAIR) 10 MG tablet TAKE 1 TABLET BY MOUTH AT BEDTIME  . nystatin ointment (MYCOSTATIN) Apply 1 application topically 2 (two) times daily.  Marland Kitchen OVER THE COUNTER MEDICATION Fennel seeds. One tsp with meals  . OVER THE COUNTER MEDICATION Tart cherry extract  . OVER THE COUNTER MEDICATION Sleep essentials  . rosuvastatin (CRESTOR) 5 MG tablet Take 1 tablet on Monday and 1 tablet on friday  . spironolactone (ALDACTONE) 25 MG tablet Take 1 tablet (25 mg total) by mouth daily.  Marland Kitchen tiZANidine (ZANAFLEX) 4 MG tablet 1 qhs prn caution drowsiness  . VITAMIN A PO Take 10,000 mcg by mouth daily.   . vitamin C (ASCORBIC ACID) 500 MG tablet Take 6,000 mg by mouth daily.   . vitamin E 100 UNIT capsule Take 800 Units by mouth daily.    No facility-administered encounter medications on file as of 08/06/2020.   ALLERGIES: Allergies  Allergen Reactions  . Ambien [Zolpidem Tartrate] Other (See Comments)    Side effect "last a couple days"  . Cefzil [Cefprozil]     diarrhea  . Codeine Itching and Nausea And Vomiting  . Demerol [Meperidine] Other (See Comments)    "messes up senses"  . Formaldehyde   . Latex Other (See Comments)    "skin gets raw"  . Sulfa Antibiotics Other (See Comments)    Severe abd cramp  . Celexa [Citalopram Hydrobromide] Other (See Comments)    Mouth sores    VACCINATION STATUS: Immunization History  Administered Date(s) Administered  . Fluad Quad(high Dose 65+) 01/22/2020  . Influenza,inj,Quad PF,6+ Mos 03/02/2016, 03/03/2017  . Influenza-Unspecified 04/15/2018, 01/15/2019  . Moderna Sars-Covid-2 Vaccination 05/23/2019, 06/19/2019, 03/05/2020  . Pneumococcal Conjugate-13 05/22/2014  . Pneumococcal Polysaccharide-23 03/03/2017  . Tdap 04/18/2020  . Zoster Recombinat (Shingrix) 12/08/2018, 04/27/2019    HPI Kristen Mack is 75 y.o. female who is accompanied by her daughter returns for  follow-up after she was seen in consultation for incidental 1.5 cm left adrenal adenoma which was previously worked up and shown to be nonfunctional.  -In the interval, she underwent surveillance MRI abdomen/pelvis which documented stable finding.  This study was done to follow-up pancreatic cyst which was also reported to be  stable and favoring benign findings.  She has no new complaints today.  She is also on follow-up for type 2 diabetes and hypothyroidism.  She is tolerating her current medications including Januvia 100 mg p.o. daily and levothyroxine 112 mcg p.o. daily.  -Her  previous 24-hour urine function studies for adrenal function have been within normal limits .  -She denies history of difficult to control hypertension, in fact has history of orthostatic hypotension.  She denies spells of palpitations, headaches, sweating. -She denies close  family history of pituitary, adrenal dysfunctions.  Review of Systems  Limited as above.  Objective:    BP 134/88   Pulse 64   Ht 5' 1.5" (1.562 m)   Wt 215 lb (97.5 kg)   BMI 39.97 kg/m   Wt Readings from Last 3 Encounters:  08/06/20 215 lb (97.5 kg)  07/08/20 206 lb (93.4 kg)  06/30/20 209 lb 6.4 oz (95 kg)    ++ Bilateral lower extremity edema   Recent Results (from the past 2160 hour(s))  Basic Metabolic Panel (BMET)     Status: Abnormal   Collection Time: 06/25/20 11:44 AM  Result Value Ref Range   Glucose 135 (H) 65 - 99 mg/dL   BUN 28 (H) 8 - 27 mg/dL   Creatinine, Ser 1.61 (H) 0.57 - 1.00 mg/dL   eGFR 33 (L) >59 mL/min/1.73   BUN/Creatinine Ratio 17 12 - 28   Sodium 140 134 - 144 mmol/L   Potassium 4.9 3.5 - 5.2 mmol/L   Chloride 106 96 - 106 mmol/L   CO2 16 (L) 20 - 29 mmol/L   Calcium 9.4 8.7 - 10.3 mg/dL  Lipid Profile     Status: Abnormal   Collection Time: 06/25/20 11:44 AM  Result Value Ref Range   Cholesterol, Total 155 100 - 199 mg/dL   Triglycerides 151 (H) 0 - 149 mg/dL   HDL 43 >39 mg/dL   VLDL  Cholesterol Cal 26 5 - 40 mg/dL   LDL Chol Calc (NIH) 86 0 - 99 mg/dL   Chol/HDL Ratio 3.6 0.0 - 4.4 ratio    Comment:                                   T. Chol/HDL Ratio                                             Men  Women                               1/2 Avg.Risk  3.4    3.3                                   Avg.Risk  5.0    4.4                                2X Avg.Risk  9.6    7.1                                3X Avg.Risk 23.4  11.0   Hepatic function panel     Status: None   Collection Time: 06/25/20 11:44 AM  Result Value Ref Range   Total Protein 7.4 6.0 - 8.5 g/dL   Albumin 4.1 3.7 - 4.7 g/dL   Bilirubin Total 0.3 0.0 - 1.2 mg/dL   Bilirubin, Direct 0.11 0.00 - 0.40 mg/dL   Alkaline Phosphatase 86 44 - 121 IU/L   AST 19 0 - 40 IU/L   ALT 20 0 - 32 IU/L  Hemoglobin A1c     Status: Abnormal   Collection Time: 06/25/20 11:44 AM  Result Value Ref Range   Hgb A1c MFr Bld 6.6 (H) 4.8 - 5.6 %    Comment:          Prediabetes: 5.7 - 6.4          Diabetes: >6.4          Glycemic control for adults with diabetes: <7.0    Est. average glucose Bld gHb Est-mCnc 143 mg/dL  Urine Microalbumin w/creat. ratio     Status: Abnormal   Collection Time: 06/25/20 11:44 AM  Result Value Ref Range   Creatinine, Urine 58.0 Not Estab. mg/dL   Microalbumin, Urine 21.0 Not Estab. ug/mL   Microalb/Creat Ratio 36 (H) 0 - 29 mg/g creat    Comment:                        Normal:                0 -  29                        Moderately increased: 30 - 300                        Severely increased:       >300   Lipase     Status: None   Collection Time: 06/25/20 11:44 AM  Result Value Ref Range   Lipase 56 14 - 85 U/L  Lipid panel     Status: None   Collection Time: 07/29/20  2:42 PM  Result Value Ref Range   Cholesterol, Total 160 100 - 199 mg/dL   Triglycerides 147 0 - 149 mg/dL   HDL 44 >39 mg/dL   VLDL Cholesterol Cal 26 5 - 40 mg/dL   LDL Chol Calc (NIH) 90 0 - 99 mg/dL   Chol/HDL  Ratio 3.6 0.0 - 4.4 ratio    Comment:                                   T. Chol/HDL Ratio                                             Men  Women                               1/2 Avg.Risk  3.4    3.3                                   Avg.Risk  5.0  4.4                                2X Avg.Risk  9.6    7.1                                3X Avg.Risk 23.4   11.0   TSH     Status: None   Collection Time: 07/29/20  2:42 PM  Result Value Ref Range   TSH 4.330 0.450 - 4.500 uIU/mL  T4, free     Status: None   Collection Time: 07/29/20  2:42 PM  Result Value Ref Range   Free T4 1.02 0.82 - 1.77 ng/dL    Diabetic Labs (most recent): Lab Results  Component Value Date   HGBA1C 6.6 (H) 06/25/2020   HGBA1C 6.5 (A) 02/06/2020   HGBA1C 7.0 (H) 11/01/2019     Lipid Panel ( most recent) Lipid Panel     Component Value Date/Time   CHOL 160 07/29/2020 1442   TRIG 147 07/29/2020 1442   HDL 44 07/29/2020 1442   CHOLHDL 3.6 07/29/2020 1442   CHOLHDL 3.3 05/07/2014 1122   VLDL 24 05/07/2014 1122   LDLCALC 90 07/29/2020 1442          MRI of abdomen on May 04, 2017:  Normal right adrenal gland. Left adrenal nodule on the order of 1.5 cm is consistent with an adenoma.   Surveillance abdominal MRI from December 25, 2019 IMPRESSION: 1. Motion degraded exam shows multiple tiny cystic lesions in the pancreas. Dominant lesion has minimally increased from 14 mm to 17 mm in the 2.5 year interval since previous MRI. No gross soft tissue component or abnormal enhancement evident on today's markedly motion degraded exam. At 17 mm, consensus guidelines recommend repeat imaging every 6 months for 2 years to ensure stability. This recommendation follows ACR consensus guidelines: Management of Incidental Pancreatic Cysts: A White Paper of the ACR Incidental Findings Committee. [place] 9509;32:671-245. 2. Additional tiny 6 mm cystic foci in the body/tail region are new. Attention  on follow-up recommended. 3. Abnormal fluid in the endometrial cavity of the uterus. Pelvic ultrasound for further evaluation. 4. Stable left adrenal adenoma. 5. Small hiatal hernia.   MRI of abdomen/pelvis on July 15, 2020 IMPRESSION: Motion degraded images.  16 mm unilocular pancreatic cyst, unchanged. This favors a benign pseudocyst or side branch IPMN and is of questionable clinical significance. Consider follow-up MR or CT abdomen with/without contrast in 1 year, as clinically warranted. (MR is generally favored, but CT may be preferred given motion concerns).  Suspected endometrial soft tissue/thickening, incompletely visualized/evaluated. This continues to raise concern for early endometrial neoplasm. GYN consultation is suggested.  These results will be called to the ordering clinician or representative by the Radiologist Assistant, and communication documented in the PACS or [employer].  Assessment & Plan:   1. Adenoma of left adrenal gland I reviewed her interval labs, imaging studies with her and her daughter in the exam room.    The left adrenal gland is reported to have mild thickening, right adrenal gland is normal.    -Her previsit plasma metanephrines are within normal limits.   -She has previously undergone 24-hour urine studies for catecholamines, metanephrines, cortisol, and aldosterone, which are all within normal limits.  -This is consistent with nonfunctioning left adrenal adenoma of 1.5 cm,  stable on imaging studies.   -She will not require any specific treatment at this time. -Related to her pancreatic cysts/pseudocysts she is scheduled to have MRI every 6 months, will use those  imaging studies to follow-up on the adrenal adenoma.    2.  Type 2 diabetes- -her recent A1c was 6.6%, remaining stable.  She will not need insulin treatment at this time.   She is advised to continue Januvia 100 mg p.o. daily at breakfast. - she acknowledges that  there is a room for improvement in her food and drink choices. - Suggestion is made for her to avoid simple carbohydrates  from her diet including Cakes, Sweet Desserts, Ice Cream, Soda (diet and regular), Sweet Tea, Candies, Chips, Cookies, Store Bought Juices, Alcohol in Excess of  1-2 drinks a day, Artificial Sweeteners,  Coffee Creamer, and "Sugar-free" Products, Lemonade. This will help patient to have more stable blood glucose profile and potentially avoid unintended weight gain.    3.  Hypothyroidism  Her levothyroxine was continued at 112 mcg p.o. daily before breakfast.  Her previsit thyroid function tests are consistent with appropriate replacement.  - We discussed about the correct intake of her thyroid hormone, on empty stomach at fasting, with water, separated by at least 30 minutes from breakfast and other medications,  and separated by more than 4 hours from calcium, iron, multivitamins, acid reflux medications (PPIs). -Patient is made aware of the fact that thyroid hormone replacement is needed for life, dose to be adjusted by periodic monitoring of thyroid function tests.     - I advised patient to maintain close follow up with Kathyrn Drown, MD for primary care needs.   I spent 32 minutes in the care of the patient today including review of labs from Thyroid Function, CMP, and other relevant labs ; imaging/biopsy records (current and previous including abstractions from other facilities); face-to-face time discussing  her lab results and symptoms, medications doses, her options of short and long term treatment based on the latest standards of care / guidelines;   and documenting the encounter.  [name]  participated in the discussions, expressed understanding, and voiced agreement with the above plans.  All questions were answered to her satisfaction. she is encouraged to contact clinic should she have any questions or concerns prior to her return visit.   Follow  up plan: Return in about 6 months (around 02/06/2021) for F/U with Pre-visit Labs, A1c -NV.   Glade Lloyd, MD [facility] [address] [place], [place] [address] Phone: [phone]  Fax: [phone]     08/06/2020, 4:02 PM  This note was partially dictated with voice recognition software. Similar sounding words can be transcribed inadequately or may not  be corrected upon review.

## 2020-08-06 NOTE — Patient Instructions (Signed)

## 2020-08-13 DIAGNOSIS — N1832 Chronic kidney disease, stage 3b: Secondary | ICD-10-CM | POA: Diagnosis not present

## 2020-08-18 DIAGNOSIS — I129 Hypertensive chronic kidney disease with stage 1 through stage 4 chronic kidney disease, or unspecified chronic kidney disease: Secondary | ICD-10-CM | POA: Diagnosis not present

## 2020-08-18 DIAGNOSIS — E1122 Type 2 diabetes mellitus with diabetic chronic kidney disease: Secondary | ICD-10-CM | POA: Diagnosis not present

## 2020-08-18 DIAGNOSIS — G4733 Obstructive sleep apnea (adult) (pediatric): Secondary | ICD-10-CM | POA: Diagnosis not present

## 2020-08-18 DIAGNOSIS — K219 Gastro-esophageal reflux disease without esophagitis: Secondary | ICD-10-CM | POA: Diagnosis not present

## 2020-08-18 DIAGNOSIS — R300 Dysuria: Secondary | ICD-10-CM | POA: Diagnosis not present

## 2020-08-18 DIAGNOSIS — N1832 Chronic kidney disease, stage 3b: Secondary | ICD-10-CM | POA: Diagnosis not present

## 2020-08-18 DIAGNOSIS — K7581 Nonalcoholic steatohepatitis (NASH): Secondary | ICD-10-CM | POA: Diagnosis not present

## 2020-08-20 ENCOUNTER — Telehealth: Payer: Self-pay | Admitting: Cardiovascular Disease

## 2020-08-20 NOTE — Telephone Encounter (Signed)
   Name: Kristen Mack DOB: May 09, 1945  MRN: [id number]  Primary Cardiologist: Evalina Field, MD  Chart reviewed as part of pre-operative protocol coverage.   75 y.o. female with . Diabetes mellitus  . Chronic kidney disease  . Hyperlipidemia  . Hypothyroidism  . OSA  . Bradycardia . PVCs . Hx of CVA  . Echocardiogram 11/21: normal EF  Last OV:  07/08/20 with Dr. Audie Box Procedure:  D&C Rx:  No request  RCRI:  Perioperative Risk of Major Cardiac Event is (%): 0.9 (low risk)  Attempted to call pt.  Only 1 contact # listed.  No answer and no VM.  Richardson Dopp, PA-C 08/20/2020, 3:53 PM

## 2020-08-20 NOTE — Telephone Encounter (Signed)
   [facility] Pre-operative Risk Assessment    Patient Name: Kristen Mack    [facility] STAFF: - Please ensure there is not already an duplicate clearance open for this procedure. - Under Visit Info/Reason for Call, type in Other and utilize the format Clearance MM/DD/YY or Clearance TBD. Do not use dashes or single digits. - If request is for dental extraction, please clarify the # of teeth to be extracted.  Request for surgical clearance:  1. What type of surgery is being performed? dilation and curettage hysteroscopy    2. When is this surgery scheduled? TBD  3. What type of clearance is required (medical clearance vs. Pharmacy clearance to hold med vs. Both)? Medical  4. Are there any medications that need to be held prior to surgery and how long? no   5. Practice name and name of physician performing surgery? [facility], Dr. Rogelio Seen Tinn  6. What is the office phone number? [phone]   7.   What is the office fax number? [phone]  8.   Anesthesia type (None, local, MAC, general) ? choice   Selena Zobro 08/20/2020, 2:09 PM  _________________________________________________________________   (provider comments below)

## 2020-08-21 NOTE — Telephone Encounter (Signed)
   Name: Kristen Mack  DOB: 08-19-1945  MRN: [id number]   Primary Cardiologist: Evalina Field, MD  Chart reviewed as part of pre-operative protocol coverage. Patient was contacted 08/21/2020 in reference to pre-operative risk assessment for pending surgery as outlined below.  Kristen Mack was last seen on 07/08/2020 by Dr. Audie Box.  Since that day, Kristen Mack has done well, indicating she feels about the same. She denies any anginal symptoms, or symptoms of decompensation. No presyncope or syncope. She does continue to note occasional palpitations and is tolerating Cardizem CD without issues. .  Therefore, based on ACC/AHA guidelines, the patient would be at acceptable risk for the planned procedure without further cardiovascular testing.   If she develops new symptoms prior to surgery, contact our office to arrange for a follow-up visit.  I will route this recommendation to the requesting party via Epic fax function and remove from pre-op pool. Please call with questions.  Christell Faith, PA-C 08/21/2020, 3:42 PM

## 2020-08-27 ENCOUNTER — Other Ambulatory Visit: Payer: Self-pay | Admitting: Family Medicine

## 2020-10-01 ENCOUNTER — Other Ambulatory Visit: Payer: Self-pay | Admitting: *Deleted

## 2020-10-01 ENCOUNTER — Encounter: Payer: Self-pay | Admitting: Family Medicine

## 2020-10-01 DIAGNOSIS — E785 Hyperlipidemia, unspecified: Secondary | ICD-10-CM

## 2020-10-01 DIAGNOSIS — Z79899 Other long term (current) drug therapy: Secondary | ICD-10-CM

## 2020-10-01 DIAGNOSIS — N183 Chronic kidney disease, stage 3 unspecified: Secondary | ICD-10-CM

## 2020-10-01 DIAGNOSIS — I10 Essential (primary) hypertension: Secondary | ICD-10-CM

## 2020-10-01 NOTE — Telephone Encounter (Signed)
Last labs 06/25/20 lipid, liver, a1c, urine acr, bmp, lipase

## 2020-10-01 NOTE — Telephone Encounter (Signed)
Lipid, liver, metabolic 7, H8V  Diabetes, hyperlipidemia, hypertension

## 2020-10-02 DIAGNOSIS — I129 Hypertensive chronic kidney disease with stage 1 through stage 4 chronic kidney disease, or unspecified chronic kidney disease: Secondary | ICD-10-CM | POA: Diagnosis not present

## 2020-10-02 DIAGNOSIS — E785 Hyperlipidemia, unspecified: Secondary | ICD-10-CM | POA: Diagnosis not present

## 2020-10-02 DIAGNOSIS — Z79899 Other long term (current) drug therapy: Secondary | ICD-10-CM | POA: Diagnosis not present

## 2020-10-02 DIAGNOSIS — E1169 Type 2 diabetes mellitus with other specified complication: Secondary | ICD-10-CM | POA: Diagnosis not present

## 2020-10-02 DIAGNOSIS — E1122 Type 2 diabetes mellitus with diabetic chronic kidney disease: Secondary | ICD-10-CM | POA: Diagnosis not present

## 2020-10-02 DIAGNOSIS — I10 Essential (primary) hypertension: Secondary | ICD-10-CM | POA: Diagnosis not present

## 2020-10-02 DIAGNOSIS — N183 Chronic kidney disease, stage 3 unspecified: Secondary | ICD-10-CM | POA: Diagnosis not present

## 2020-10-03 LAB — BASIC METABOLIC PANEL
BUN/Creatinine Ratio: 17 (ref 12–28)
BUN: 31 mg/dL — ABNORMAL HIGH (ref 8–27)
CO2: 21 mmol/L (ref 20–29)
Calcium: 10.2 mg/dL (ref 8.7–10.3)
Chloride: 103 mmol/L (ref 96–106)
Creatinine, Ser: 1.78 mg/dL — ABNORMAL HIGH (ref 0.57–1.00)
Glucose: 101 mg/dL — ABNORMAL HIGH (ref 65–99)
Potassium: 4.6 mmol/L (ref 3.5–5.2)
Sodium: 139 mmol/L (ref 134–144)
eGFR: 29 mL/min/{1.73_m2} — ABNORMAL LOW (ref >59)

## 2020-10-03 LAB — HEMOGLOBIN A1C
Est. average glucose Bld gHb Est-mCnc: 154 mg/dL
Hgb A1c MFr Bld: 7 % — ABNORMAL HIGH (ref 4.8–5.6)

## 2020-10-03 LAB — LIPID PANEL
Chol/HDL Ratio: 3.8 ratio (ref 0.0–4.4)
Cholesterol, Total: 176 mg/dL (ref 100–199)
HDL: 46 mg/dL (ref >39)
LDL Chol Calc (NIH): 99 mg/dL (ref 0–99)
Triglycerides: 180 mg/dL — ABNORMAL HIGH (ref 0–149)
VLDL Cholesterol Cal: 31 mg/dL (ref 5–40)

## 2020-10-03 LAB — HEPATIC FUNCTION PANEL
ALT: 22 IU/L (ref 0–32)
AST: 22 IU/L (ref 0–40)
Albumin: 4.5 g/dL (ref 3.7–4.7)
Alkaline Phosphatase: 87 IU/L (ref 44–121)
Bilirubin Total: 0.3 mg/dL (ref 0.0–1.2)
Bilirubin, Direct: 0.11 mg/dL (ref 0.00–0.40)
Total Protein: 7.8 g/dL (ref 6.0–8.5)

## 2020-10-06 ENCOUNTER — Encounter: Payer: Self-pay | Admitting: Pharmacist

## 2020-10-06 DIAGNOSIS — Z9229 Personal history of other drug therapy: Secondary | ICD-10-CM

## 2020-10-06 NOTE — Progress Notes (Signed)
[facility])                                            [place] Team                                        Statin Quality Measure Assessment    10/06/2020  Kristen Mack 07-14-1945 [id number]    Per review of chart and payor information, patient has a diagnosis of diabetes but is not currently filling a statin prescription.  This places patient into the SUPD (Statin Use In Patients with Diabetes) measure for CMS.    Patient is on Rosuvastatin.  She reported only taking 1 tablet twice weekly.  She had rosuvastatin filled in November of 2021 (#90 for a 90 day supply).  Since her dose has been decreased, she probably had plenty of tablets from last year as to why she has not had it filled.  She has an appointment on 10/07/20 with her PCP.  If deemed therapeutically appropriate, a new prescription could be sent to her pharmacy with the correct dose.    The 10-year ASCVD risk score Kristen Mack DC Kristen Mack., et al., 2013) is: 42.5%   Values used to calculate the score:     Age: 75 years     Sex: Female     Is Non-Hispanic African American: No     Diabetic: Yes     Tobacco smoker: No     Systolic Blood Pressure: 224 mmHg     Is BP treated: Yes     HDL Cholesterol: 46 mg/dL     Total Cholesterol: 176 mg/dL 10/02/2020     Component Value Date/Time   CHOL 176 10/02/2020 1640   TRIG 180 (H) 10/02/2020 1640   HDL 46 10/02/2020 1640   CHOLHDL 3.8 10/02/2020 1640   CHOLHDL 3.3 05/07/2014 1122   VLDL 24 05/07/2014 1122   LDLCALC 99 10/02/2020 1640    Please consider ONE of the following recommendations:  Initiate high intensity statin Atorvastatin 31m once daily, #90, 3 refills   Rosuvastatin 277monce daily, #90, 3 refills    Initiate moderate intensity          statin with reduced frequency if prior          statin intolerance 1x weekly, #13, 3 refills   2x weekly, #26, 3 refills   3x weekly, #39, 3 refills    Code  for past statin intolerance or  other exclusions (required annually)   Provider Requirements:  Associate code during an office visit or telehealth encounter  Drug Induced Myopathy G72.0   Myopathy, unspecified G72.9   Myositis, unspecified M60.9   Rhabdomyolysis M6S97.53 Alcoholic fatty liver K7Y05.1 Cirrhosis of liver K74.69   Prediabetes R73.03   PCOS E28.2   Toxic liver disease, unspecified K71.9   Adverse effect of antihyperlipidemic and antiarteriosclerotic drugs, initial encounter [id number] Plan:  Send note to PCP prior to 10/07/20 Office visit  KaElayne GuerinPharmD, BCChapinlinical Pharmacist ([phone]

## 2020-10-07 ENCOUNTER — Ambulatory Visit (INDEPENDENT_AMBULATORY_CARE_PROVIDER_SITE_OTHER): Payer: HMO | Admitting: Family Medicine

## 2020-10-07 ENCOUNTER — Other Ambulatory Visit: Payer: Self-pay

## 2020-10-07 VITALS — BP 136/84 | HR 82 | Ht 61.5 in | Wt 215.6 lb

## 2020-10-07 DIAGNOSIS — I10 Essential (primary) hypertension: Secondary | ICD-10-CM | POA: Diagnosis not present

## 2020-10-07 DIAGNOSIS — R251 Tremor, unspecified: Secondary | ICD-10-CM

## 2020-10-07 DIAGNOSIS — E1122 Type 2 diabetes mellitus with diabetic chronic kidney disease: Secondary | ICD-10-CM

## 2020-10-07 DIAGNOSIS — E1169 Type 2 diabetes mellitus with other specified complication: Secondary | ICD-10-CM | POA: Diagnosis not present

## 2020-10-07 DIAGNOSIS — E785 Hyperlipidemia, unspecified: Secondary | ICD-10-CM | POA: Diagnosis not present

## 2020-10-07 DIAGNOSIS — G4733 Obstructive sleep apnea (adult) (pediatric): Secondary | ICD-10-CM | POA: Diagnosis not present

## 2020-10-07 DIAGNOSIS — N183 Chronic kidney disease, stage 3 unspecified: Secondary | ICD-10-CM

## 2020-10-07 DIAGNOSIS — I129 Hypertensive chronic kidney disease with stage 1 through stage 4 chronic kidney disease, or unspecified chronic kidney disease: Secondary | ICD-10-CM | POA: Diagnosis not present

## 2020-10-07 MED ORDER — MONTELUKAST SODIUM 10 MG PO TABS: 10.0000 mg | ORAL_TABLET | Freq: Every day | ORAL | 1 refills | Status: DC

## 2020-10-07 MED ORDER — BUPROPION HCL ER (XL) 300 MG PO TB24: 300.0000 mg | ORAL_TABLET | Freq: Every day | ORAL | 1 refills | Status: DC

## 2020-10-07 MED ORDER — ONETOUCH VERIO VI STRP: ORAL_STRIP | 3 refills | Status: DC

## 2020-10-07 MED ORDER — SPIRONOLACTONE 25 MG PO TABS: 25.0000 mg | ORAL_TABLET | Freq: Every day | ORAL | 5 refills | Status: DC

## 2020-10-07 MED ORDER — SITAGLIPTIN PHOSPHATE 100 MG PO TABS: 100.0000 mg | ORAL_TABLET | Freq: Every day | ORAL | 1 refills | Status: DC

## 2020-10-07 MED ORDER — ALBUTEROL SULFATE HFA 108 (90 BASE) MCG/ACT IN AERS: INHALATION_SPRAY | RESPIRATORY_TRACT | 5 refills | Status: DC

## 2020-10-07 MED ORDER — ROSUVASTATIN CALCIUM 5 MG PO TABS: ORAL_TABLET | ORAL | 5 refills | Status: DC

## 2020-10-07 MED ORDER — LEVOTHYROXINE SODIUM 112 MCG PO TABS: 112.0000 ug | ORAL_TABLET | Freq: Every day | ORAL | 1 refills | Status: DC

## 2020-10-07 NOTE — Progress Notes (Signed)
   Subjective:    Patient ID: Kristen Mack, female    DOB: 23-Apr-1945, 75 y.o.   MRN: [id number]  Hypertension This is a chronic problem. The current episode started more than 1 year ago. Risk factors for coronary artery disease include dyslipidemia, diabetes mellitus, post-menopausal state and sedentary lifestyle. Treatments tried: aldactone, cardizem CD. There are no compliance problems.    Having problems with her tremors Intermittent tremors makes it difficult to eat at times otherwise no particular troubles no falls  Patient having a D and  with GYN in the future Results for orders placed or performed in visit on 10/01/20  Lipid panel  Result Value Ref Range   Cholesterol, Total 176 100 - 199 mg/dL   Triglycerides 180 (H) 0 - 149 mg/dL   HDL 46 >39 mg/dL   VLDL Cholesterol Cal 31 5 - 40 mg/dL   LDL Chol Calc (NIH) 99 0 - 99 mg/dL   Chol/HDL Ratio 3.8 0.0 - 4.4 ratio  Hepatic function panel  Result Value Ref Range   Total Protein 7.8 6.0 - 8.5 g/dL   Albumin 4.5 3.7 - 4.7 g/dL   Bilirubin Total 0.3 0.0 - 1.2 mg/dL   Bilirubin, Direct 0.11 0.00 - 0.40 mg/dL   Alkaline Phosphatase 87 44 - 121 IU/L   AST 22 0 - 40 IU/L   ALT 22 0 - 32 IU/L  Basic metabolic panel  Result Value Ref Range   Glucose 101 (H) 65 - 99 mg/dL   BUN 31 (H) 8 - 27 mg/dL   Creatinine, Ser 1.78 (H) 0.57 - 1.00 mg/dL   eGFR 29 (L) >59 mL/min/1.73   BUN/Creatinine Ratio 17 12 - 28   Sodium 139 134 - 144 mmol/L   Potassium 4.6 3.5 - 5.2 mmol/L   Chloride 103 96 - 106 mmol/L   CO2 21 20 - 29 mmol/L   Calcium 10.2 8.7 - 10.3 mg/dL  Hemoglobin A1c  Result Value Ref Range   Hgb A1c MFr Bld 7.0 (H) 4.8 - 5.6 %   Est. average glucose Bld gHb Est-mCnc 154 mg/dL    Review of Systems     Objective:   Physical Exam  General-in no acute distress Eyes-no discharge Lungs-respiratory rate normal, CTA CV-no murmurs,RRR Extremities skin warm dry no edema Neuro grossly normal Behavior normal,  alert       Assessment & Plan:  1. HTN (hypertension), benign Blood pressure decent control continue current measures watch diet  2. Hyperlipidemia associated with type 2 diabetes mellitus (HCC) Bump up the Crestor to 3 days/week.  Patient tolerating 2 days/week.  Trying to get LDL below 70. - Lipid panel  3. Occasional tremors I find no evidence of Parkinson's disease on today's exam more than likely benign essential tremors monitor for now  4. Type 2 DM with CKD stage 3 and hypertension (HCC) A1c is gone up to 7.0 patient works hard on diet will take her medicine we will recheck this again in 4 months time - Hemoglobin A1c  Patient will be due for pancreatic MRI in April 2023

## 2020-10-22 DIAGNOSIS — H35713 Central serous chorioretinopathy, bilateral: Secondary | ICD-10-CM | POA: Diagnosis not present

## 2020-10-22 DIAGNOSIS — E119 Type 2 diabetes mellitus without complications: Secondary | ICD-10-CM | POA: Diagnosis not present

## 2020-10-22 DIAGNOSIS — H2513 Age-related nuclear cataract, bilateral: Secondary | ICD-10-CM | POA: Diagnosis not present

## 2020-11-27 NOTE — Progress Notes (Signed)
[facility] New Patient Evaluation and Consultation  Referring Provider: Sanjuana Kava, MD PCP: Kathyrn Drown, MD Date of Service: 12/02/2020  SUBJECTIVE Chief Complaint: New Patient (Initial Visit)  History of Present Illness: Kristen Mack is a 75 y.o. White or Caucasian female seen in consultation at the request of Dr. Alwyn Pea for evaluation of incontinence.    Review of records from Dr Alwyn Pea significant for: Has urinary urgency and frequency. Cystocele noted on exam.   Urinary Symptoms: Leaks urine with cough/ sneeze, laughing, exercise, lifting, going from sitting to standing, with a full bladder, with movement to the bathroom, with urgency, while asleep, and continuously. UUI > SUI.  Leaks many time(s) per day.  Pad use: 7 adult diapers per day.   She is bothered by her UI symptoms.  Day time voids- every few hours.  Nocturia: 2-3 times per night to void. Voiding dysfunction: she empties her bladder well.  does not use a catheter to empty bladder.  When urinating, she feels to push on her belly or vagina to empty bladder Drinks: 2 quarts tea with orange juice, 16oz water, 0-1 16oz coke per day  UTIs: 2-3 UTI's in the last year.   Denies history of blood in urine and kidney or bladder stones. Has history of kidney infections as a child.   Pelvic Organ Prolapse Symptoms:                  She Denies a feeling of a bulge the vaginal area.  Was told she has a cystocele.   Bowel Symptom: Bowel movements: 1-2 time(s) per day Stool consistency: soft  Straining: no.  Splinting: no.  Incomplete evacuation: no.  She Admits to accidental bowel leakage / fecal incontinence  Occurs: 1-2 time(s) per week  Consistency with leakage: soft  Bowel regimen: diet Last colonoscopy: Date 2004\  Sexual Function Sexually active: no.   Pelvic Pain Denies pelvic pain   Past Medical History:  Past Medical History:  Diagnosis Date   Arthritis    Asthma    CFS (chronic  fatigue syndrome)    Cracked tooth    X2  RIGHT LOWER MOLAR AND LEFT UPPER MOLAR   Fatty liver disease, nonalcoholic    Fibromyalgia    GERD (gastroesophageal reflux disease)    History of adenomatous polyp of colon    History of cardiac murmur    until age 5   History of TIA (transient ischemic attack)    per CT scan    Hypertension    Hypothyroidism    IBS (irritable bowel syndrome)    Meniere disease    OSA (obstructive sleep apnea)    CPAP intolerate -- per study 2010  moderate osa--  STATES HAS RAISED HOB   Osteopenia    PMB (postmenopausal bleeding)    Prediabetes    Sleep apnea 07/11/2017   Abnormal home sleep apnea test February 2019-CPAP ordered   Tooth loose    LOWER FRONT     Past Surgical History:   Past Surgical History:  Procedure Laterality Date   [place]   and  REPAIR UMBILICAL HERNIA   COLONOSCOPY N/A 04/19/2013   Procedure: COLONOSCOPY;  Surgeon: Rogene Houston, MD;  Location: [facility];  Service: Endoscopy;  Laterality: N/A;     ESOPHAGOGASTRODUODENOSCOPY  02-08-2003   HYSTEROSCOPY WITH D & C  11-14-2002   endometrial polypectomy   HYSTEROSCOPY WITH D &  C N/A 04/05/2014   Procedure: DILATATION AND CURETTAGE /HYSTEROSCOPY, ENDOMETRIAL POLYPECTOMY;  Surgeon: Sanjuana Kava, MD;  Location: [facility];  Service: Gynecology;  Laterality: N/A;   LIVER BIOPSY  01-29-2004   benign     Past OB/GYN History: OB History  Gravida Para Term Preterm AB Living  5       3 2   SAB IAB Ectopic Multiple Live Births  3       2    # Outcome Date GA Lbr Len/2nd Weight Sex Delivery Anes PTL Lv  5 Gravida           4 Gravida           3 SAB           2 SAB           1 SAB            CS x2 Menopausal: Yes, Denies vaginal bleeding since menopause   Medications: She has a current medication list which includes the following prescription(s): albuterol, azelastine hcl, b complex vitamins, bupropion,  chromium, coq10, b-12, digestive enzymes, diltiazem, onetouch verio, ipratropium, levothyroxine, lysine hcl, magnesium, meclizine, montelukast, nystatin ointment, OVER THE COUNTER MEDICATION, OVER THE COUNTER MEDICATION, OVER THE COUNTER MEDICATION, rosuvastatin, sitagliptin, spironolactone, tizanidine, vitamin a, vitamin c, and vitamin e.   Allergies: Patient is allergic to [employer] tartrate], cefzil [cefprozil], codeine, demerol [meperidine], formaldehyde, latex, sulfa antibiotics, and celexa [citalopram hydrobromide].   Social History:  Social History   Tobacco Use   Smoking status: Former    Packs/day: 0.50    Years: 3.00    Pack years: 1.50    Types: Cigarettes    Quit date: 10/03/1968    Years since quitting: 52.2   Smokeless tobacco: Never  Vaping Use   Vaping Use: Never used  Substance Use Topics   Alcohol use: No    Alcohol/week: 0.0 standard drinks   Drug use: No    Relationship status: married She lives with husband and daughter.   She is not employed. Regular exercise: Yes:   History of abuse: No  Family History:   Family History  Problem Relation Age of Onset   Rectal cancer Brother 76   Ovarian cancer Mother 77   Hypertension Mother    Diabetes Mother    Hypertension Father    Diabetes Father    Breast cancer Maternal Aunt    Breast cancer Paternal Aunt        four Paternal aunts   Heart disease Paternal Grandfather    Liver disease Sister        x 2; fatty liver   Pancreatic cancer Neg Hx    Esophageal cancer Neg Hx      Review of Systems: Review of Systems  Constitutional:  Positive for malaise/fatigue. Negative for fever and weight loss.  Respiratory:  Positive for shortness of breath. Negative for cough and wheezing.   Cardiovascular:  Positive for palpitations and leg swelling. Negative for chest pain.  Gastrointestinal:  Negative for abdominal pain and blood in stool.  Genitourinary:  Negative for dysuria.  Musculoskeletal:  Positive  for myalgias.  Skin:  Negative for rash.  Neurological:  Positive for dizziness and headaches.  Endo/Heme/Allergies:  Bruises/bleeds easily.  Psychiatric/Behavioral:  Positive for depression. The patient is not nervous/anxious.     OBJECTIVE Physical Exam: Vitals:   12/02/20 1430  BP: (!) 142/92  Pulse: 63  Weight: 215 lb (97.5 kg)  Height: 4' 11"  (  1.499 m)    Physical Exam Constitutional:      General: She is not in acute distress. Pulmonary:     Effort: Pulmonary effort is normal.  Abdominal:     General: There is no distension.     Palpations: Abdomen is soft.     Tenderness: There is no abdominal tenderness. There is no rebound.  Musculoskeletal:        General: No swelling. Normal range of motion.  Skin:    General: Skin is warm and dry.     Findings: No rash.  Neurological:     Mental Status: She is alert and oriented to person, place, and time.  Psychiatric:        Mood and Affect: Mood normal.        Behavior: Behavior normal.     GU / Detailed Urogynecologic Evaluation:  Pelvic Exam: Normal external female genitalia; Bartholin's and Skene's glands normal in appearance; urethral meatus normal in appearance, no urethral masses or discharge.   CST: positive  Speculum exam reveals normal vaginal mucosa with atrophy. Cervix normal appearance. Uterus normal single, nontender. Adnexa no mass, fullness, tenderness.    Pelvic floor strength II/V, puborectalis III/V external anal sphincter II/V  Pelvic floor musculature: Right levator non-tender, Right obturator non-tender, Left levator non-tender, Left obturator non-tender  POP-Q:   POP-Q  -2.5                                            Aa   -2.5                                           Ba  -7                                              C   2                                            Gh  2.5                                            Pb  10                                            tvl   -2                                             Ap  -2  Bp  -9.5                                              D     Rectal Exam:  Normal sphincter tone, no distal rectocele, enterocoele not present, no rectal masses, no sign of dyssynergia when asking the patient to bear down.  Post-Void Residual (PVR) by Bladder Scan: In order to evaluate bladder emptying, we discussed obtaining a postvoid residual and she agreed to this procedure.  Procedure: The ultrasound unit was placed on the patient's abdomen in the suprapubic region after the patient had voided. A PVR of 5 ml was obtained by bladder scan.  Laboratory Results: POC urine: small leukocytes, negative nitrites  ASSESSMENT AND PLAN Ms. Lampi is a 75 y.o. with:  1. Overactive bladder   2. Urinary frequency   3. SUI (stress urinary incontinence, female)   4. Incontinence of feces, unspecified fecal incontinence type    OAB -We discussed the symptoms of overactive bladder (OAB), which include urinary urgency, urinary frequency, nocturia, with or without urge incontinence.  While we do not know the exact etiology of OAB, several treatment options exist. We discussed management including behavioral therapy (decreasing bladder irritants, urge suppression strategies, timed voids, bladder retraining), physical therapy, medication.  - recommended reducing intake of tea, orange juice and soda - She will start with pelvic floor physical therapy, referral placed  2. SUI - This is less bothersome, so did not discuss all options for treatment. She is pursuing physical therapy.   3. Accidental Bowel Leakage:  - Treatment options include anti-diarrhea medication (loperamide/ Imodium OTC or prescription lomotil), fiber supplements, physical therapy, and possible sacral neuromodulation or surgery.   - She will start with benefiber and physical therapy  Return 3 months for follow up  Jaquita Folds,  MD   Medical Decision Making:  - Reviewed/ ordered a clinical laboratory test - Review and summation of prior records

## 2020-12-02 ENCOUNTER — Encounter: Payer: Self-pay | Admitting: Obstetrics and Gynecology

## 2020-12-02 ENCOUNTER — Other Ambulatory Visit: Payer: Self-pay

## 2020-12-02 ENCOUNTER — Ambulatory Visit (INDEPENDENT_AMBULATORY_CARE_PROVIDER_SITE_OTHER): Payer: HMO | Admitting: Obstetrics and Gynecology

## 2020-12-02 VITALS — BP 142/92 | HR 63 | Ht 59.0 in | Wt 215.0 lb

## 2020-12-02 DIAGNOSIS — N3281 Overactive bladder: Secondary | ICD-10-CM | POA: Diagnosis not present

## 2020-12-02 DIAGNOSIS — R350 Frequency of micturition: Secondary | ICD-10-CM

## 2020-12-02 DIAGNOSIS — R159 Full incontinence of feces: Secondary | ICD-10-CM

## 2020-12-02 DIAGNOSIS — N393 Stress incontinence (female) (male): Secondary | ICD-10-CM | POA: Diagnosis not present

## 2020-12-02 LAB — POCT URINALYSIS DIPSTICK
Appearance: ABNORMAL
Bilirubin, UA: NEGATIVE
Blood, UA: NEGATIVE
Glucose, UA: NEGATIVE
Ketones, UA: NEGATIVE
Nitrite, UA: NEGATIVE
Protein, UA: NEGATIVE
Spec Grav, UA: 1.02 (ref 1.010–1.025)
Urobilinogen, UA: 0.2 E.U./dL
pH, UA: 6 (ref 5.0–8.0)

## 2020-12-02 NOTE — Patient Instructions (Addendum)
Today we talked about ways to manage bladder urgency such as altering your diet to avoid irritative beverages and foods (bladder diet) as well as attempting to decrease stress and other exacerbating factors.   The Most Bothersome Foods* The Least Bothersome Foods*  Coffee - Regular & Decaf Tea - caffeinated Carbonated beverages - cola, non-colas, diet & caffeine-free Alcohols - Beer, Red Wine, White Wine, Champagne Fruits - Grapefruit, [place], Orange, [employer] - Cranberry, Grapefruit, Orange, Pineapple Vegetables - Tomato & Tomato Products Flavor Enhancers - Hot peppers, Spicy foods, Chili, Horseradish, Vinegar, Monosodium glutamate (MSG) Artificial Sweeteners - NutraSweet, Sweet 'N Low, Equal (sweetener), Saccharin Ethnic foods - [place], [place], [place] food [employer] - low-fat & whole Fruits - Bananas, Blueberries, Honeydew melon, Pears, Raisins, Watermelon Vegetables - Broccoli, Brussels Sprouts, [place], Carrots, Cauliflower, [place], Cucumber, Mushrooms, Peas, Radishes, Squash, Zucchini, White potatoes, Sweet potatoes & yams Poultry - Chicken, Eggs, [place], [employer] - Beef, Programmer, multimedia, Lamb Seafood - Shrimp, [place] fish, Salmon Grains - Oat, Rice Snacks - Pretzels, Popcorn  *Lissa Morales et al. Diet and its role in interstitial cystitis/bladder pain syndrome (IC/BPS) and comorbid conditions. [place] 2012 Jan 11.   Accidental Bowel Leakage: Our goal is to achieve formed bowel movements daily or every-other-day without leakage.  You may need to try different combinations of the following options to find what works best for you.  Some management options include: Dietary changes (more leafy greens, vegetables and fruits; less processed foods) Fiber supplementation (Metamucil or something with psyllium as active ingredient) Over-the-counter imodium (tablets or liquid) to help solidify the stool and prevent leakage of stool. If you get constipated you can use Miralax as  needed to achieve bowel movements.

## 2020-12-12 DIAGNOSIS — N184 Chronic kidney disease, stage 4 (severe): Secondary | ICD-10-CM | POA: Diagnosis not present

## 2020-12-15 DIAGNOSIS — G4733 Obstructive sleep apnea (adult) (pediatric): Secondary | ICD-10-CM | POA: Diagnosis not present

## 2020-12-15 DIAGNOSIS — I129 Hypertensive chronic kidney disease with stage 1 through stage 4 chronic kidney disease, or unspecified chronic kidney disease: Secondary | ICD-10-CM | POA: Diagnosis not present

## 2020-12-15 DIAGNOSIS — E1122 Type 2 diabetes mellitus with diabetic chronic kidney disease: Secondary | ICD-10-CM | POA: Diagnosis not present

## 2020-12-15 DIAGNOSIS — K7581 Nonalcoholic steatohepatitis (NASH): Secondary | ICD-10-CM | POA: Diagnosis not present

## 2020-12-15 DIAGNOSIS — K219 Gastro-esophageal reflux disease without esophagitis: Secondary | ICD-10-CM | POA: Diagnosis not present

## 2020-12-15 DIAGNOSIS — N1832 Chronic kidney disease, stage 3b: Secondary | ICD-10-CM | POA: Diagnosis not present

## 2020-12-15 DIAGNOSIS — R350 Frequency of micturition: Secondary | ICD-10-CM | POA: Diagnosis not present

## 2020-12-18 ENCOUNTER — Encounter: Payer: Self-pay | Admitting: Family Medicine

## 2020-12-21 NOTE — Telephone Encounter (Signed)
Nurses Lets initiate the order for Reclast Thanks-Dr. Nicki Reaper

## 2020-12-25 ENCOUNTER — Other Ambulatory Visit: Payer: Self-pay

## 2020-12-25 ENCOUNTER — Encounter: Payer: Self-pay | Admitting: Physical Therapy

## 2020-12-25 ENCOUNTER — Encounter: Payer: HMO | Attending: Family Medicine | Admitting: Physical Therapy

## 2020-12-25 DIAGNOSIS — M6281 Muscle weakness (generalized): Secondary | ICD-10-CM | POA: Diagnosis not present

## 2020-12-25 DIAGNOSIS — R159 Full incontinence of feces: Secondary | ICD-10-CM | POA: Insufficient documentation

## 2020-12-25 DIAGNOSIS — N393 Stress incontinence (female) (male): Secondary | ICD-10-CM | POA: Diagnosis not present

## 2020-12-25 DIAGNOSIS — R278 Other lack of coordination: Secondary | ICD-10-CM | POA: Insufficient documentation

## 2020-12-25 NOTE — Patient Instructions (Signed)
Access Code: [email or web address] URL: [email or web address] Date: 12/25/2020 Prepared by: Earlie Counts  Exercises Supine Pelvic Floor Contraction - 3 x daily - 7 x weekly - 1 sets - 10 reps - 5 sec hold Quick Flick Pelvic Floor Contractions in Hooklying - 3 x daily - 7 x weekly - 1 sets - 5 reps - sec hold

## 2020-12-25 NOTE — Therapy (Signed)
[facility] [address], [place], [place], [address] Phone: [phone]   Fax:  [phone]  Physical Therapy Evaluation  Patient Details  Name: Kristen Mack MRN: [id number] Date of Birth: 07/10/1945 Referring Provider (PT): Dr. Sherlene Shams   Encounter Date: 12/25/2020   PT End of Session - 12/25/20 1646     Visit Number 1    Date for PT Re-Evaluation 03/19/21    Authorization Type [employer]    Authorization - Visit Number 1    Authorization - Number of Visits 10    PT Start Time 1400    PT Stop Time 1500    PT Time Calculation (min) 60 min    Activity Tolerance Patient tolerated treatment well    Behavior During Therapy WFL for tasks assessed/performed             Past Medical History:  Diagnosis Date   Arthritis    Asthma    CFS (chronic fatigue syndrome)    Cracked tooth    X2  RIGHT LOWER MOLAR AND LEFT UPPER MOLAR   Fatty liver disease, nonalcoholic    Fibromyalgia    GERD (gastroesophageal reflux disease)    History of adenomatous polyp of colon    History of cardiac murmur    until age 25   History of TIA (transient ischemic attack)    per CT scan    Hypertension    Hypothyroidism    IBS (irritable bowel syndrome)    Meniere disease    OSA (obstructive sleep apnea)    CPAP intolerate -- per study 2010  moderate osa--  STATES HAS RAISED HOB   Osteopenia    PMB (postmenopausal bleeding)    Prediabetes    Sleep apnea 07/11/2017   Abnormal home sleep apnea test February 2019-CPAP ordered   Tooth loose    LOWER FRONT    Past Surgical History:  Procedure Laterality Date   [place]   and  REPAIR UMBILICAL HERNIA   COLONOSCOPY N/A 04/19/2013   Procedure: COLONOSCOPY;  Surgeon: Rogene Houston, MD;  [facility];  Service: Endoscopy;  Laterality: N/A;     ESOPHAGOGASTRODUODENOSCOPY  02-08-2003    HYSTEROSCOPY WITH D & C  11-14-2002   endometrial polypectomy   HYSTEROSCOPY WITH D & C N/A 04/05/2014   Procedure: DILATATION AND CURETTAGE /HYSTEROSCOPY, ENDOMETRIAL POLYPECTOMY;  Surgeon: Sanjuana Kava, MD;  Location: [place];  Service: Gynecology;  Laterality: N/A;   LIVER BIOPSY  01-29-2004   benign    There were no vitals filed for this visit.    Subjective Assessment - 12/25/20 1411     Subjective When stands up with a partially full bladder she will completely empty her bladder. 1 time per month leaks stool. she has an intense feeling of haveing to go to the bathroom immediately and just barely makes it to the commode.    Patient Stated Goals activate core muscles; stand up and not flood her diaper;    Currently in Pain? No/denies                [facility] PT Assessment - 12/25/20 0001       Assessment   Medical Diagnosis N32.81 Overactive Bladder; N39.3 SUI; R15.9 Incontinece of feces    Referring Provider (PT) Dr. Sherlene Shams    Onset Date/Surgical Date --   chronic   Prior  Therapy None      Precautions   Precautions Other (comment)    Precaution Comments osteoporosis      Restrictions   Weight Bearing Restrictions No      Balance Screen   Has the patient fallen in the past 6 months No    Has the patient had a decrease in activity level because of a fear of falling?  No    Is the patient reluctant to leave their home because of a fear of falling?  No      Home Ecologist residence    Living Arrangements Spouse/significant other;Children      Prior Function   Level of [place] Retired      Associate Professor   Overall Cognitive Status Within Functional Limits for tasks assessed      Posture/Postural Control   Posture/Postural Control Postural limitations    Postural Limitations Rounded Shoulders;Forward head      ROM / Strength   AROM / PROM / Strength AROM;PROM;Strength       Strength   Overall Strength Comments hip abduction and extension 3/5      Palpation   Palpation comment Difficulty with expanding the lower rib cage with breath                        Objective measurements completed on examination: See above findings.     Pelvic Floor Special Questions - 12/25/20 0001     Prior Pregnancies Yes    Number of Pregnancies 2    Number of C-Sections 2    Urinary Leakage Yes    Pad use wears a depends 3-4 per day depending on how much she drinks; 2 depends    Activities that cause leaking Other;With strong urge    Other activities that cause leaking going from sitting to standing    Urinary urgency Yes    Urinary frequency urinates every couple of hours    Fecal incontinence Yes   leak stool 1 time per month; has IBS   Fluid intake 16.9 ounces of water per day    Caffeine beverages 2 cokes or coffee per day; tea is her main drink   caffiene helps her ADHD   Falling out feeling (prolapse) No    External Perineal Exam some cysts on the labia    Skin Integrity Intact    Perineal Body/Introitus  Elevated    Pelvic Floor Internal Exam Patient confirms identification and approves PT to assess pelvic floor and treatment    Exam Type Vaginal    Palpation introitus is tight, perineal body is tight,    Strength weak squeeze, no lift   2/5 anterior and posterior; 1/5 on the sides   Strength # of seconds 5                       PT Education - 12/25/20 1507     Education Details Access Code: [email or web address]  URL: [email or web address]  Date: 12/25/2020  Prepared by: Earlie Counts    Exercises  Supine Pelvic Floor Contraction - 3 x daily - 7 x weekly - 1 sets - 10 reps - 5 sec hold  Quick Flick Pelvic Floor Contractions in Hooklying - 3 x daily - 7 x weekly - 1 sets - 5 reps - sec hold              PT Short Term Goals -  12/25/20 1655       PT SHORT TERM GOAL #1   Title Patient will report understanding and regular  compliance of HEP to improve patient's pelvic floor strength and coordination    Time 4    Period Weeks    Status New    Target Date 01/22/21      PT SHORT TERM GOAL #2   Title understand bladder irritatns to reduce urinary leakage    Baseline --    Time 4    Period Weeks    Status New    Target Date 01/22/21      PT SHORT TERM GOAL #3   Title --    Baseline ---               PT Long Term Goals - 12/25/20 1657       PT LONG TERM GOAL #1   Title Patient will demonstrate improvement of at least 1 MMT strength grade in hip muscles to help improve pelvic floor strength and ability to walk to the commode in time [place] to the bathroom    Baseline --    Time 12    Period Weeks    Status New    Target Date 03/19/21      PT LONG TERM GOAL #2   Title able to wear 1 depned during the day and at night due to reduction of urinary leakage >/= 50%    Baseline ---    Time 12    Period Weeks    Status New    Target Date 03/19/21      PT LONG TERM GOAL #3   Title able to go from sit to stand with urinary leakage decreased by 50% due to increased pelvic floor coordination and strength    Baseline --    Time 12    Period Weeks    Status New    Target Date 03/19/21      PT LONG TERM GOAL #4   Title reduced her tea intake by 50% so she is able to see a change in her urinary leakage and increased her water intake to 4 bottlse of water    Time 12    Period Weeks    Status New    Target Date 03/19/21      PT LONG TERM GOAL #5   Title fecal leakage decreased >/= 50% due to increased pelvic floor strength    Time 12    Period Weeks    Status New    Target Date 03/19/21                    Plan - 12/25/20 1507     Clinical Impression Statement Patient is a  75 year old female with urinary leakage and fecal leakage for the past few years. She will leak urine with cough, sneeze, laughing, going form sit to stand, and as she is walking to the commode.She wears 4 depends  during the day and 2 during the night. She will hav an accidental fecal leakage 1 time per week. She drinks 2 quarts of tea with orange juice and 1 coke. She drinks 16 ounces of ater per day. Pelvic floor strength is 2/5 anterior and posterior and 1/5 on the sides. She was able to isolate the pelvic floor contractionShe has weakness in her hips. She walks with a walker due to feeling dizzy at times. She does not feel a bulge vaginally. Patient will benefit  from skilled therapy to improve pelvic floor strength and coordination to reduce urinary and fecal leakage.    Personal Factors and Comorbidities Comorbidity 3+;Past/Current Experience;Fitness;Age    Comorbidities Osteoporosis; Fibromyalgia; hypothyroidism, TIA; c-section x2    Examination-Activity Limitations Sit;Stand;Toileting;Continence;Transfers;Lift;Locomotion Level    Examination-Participation Restrictions Community Activity    Stability/Clinical Decision Making Evolving/Moderate complexity    Clinical Decision Making Moderate    Rehab Potential Good    PT Frequency 1x / week    PT Duration 12 weeks    PT Treatment/Interventions ADLs/Self Care Home Management;Biofeedback;Therapeutic activities;Therapeutic exercise;Neuromuscular re-education;Patient/family education;Manual techniques    PT Next Visit Plan timed voiding every 2 hours; manual work to the pelvic floor for a circular contraction; review bladder irritants; low level abdominal engagement, hip sdductor    PT Home Exercise Plan Access Code: [email or web address]  URL: [email or web address]  Date: 12/25/2020  Prepared by: Earlie Counts    Exercises  Supine Pelvic Floor Contraction - 3 x daily - 7 x weekly - 1 sets - 10 reps - 5 sec hold  Quick Flick Pelvic Floor Contractions in Hooklying - 3 x daily - 7 x weekly - 1 sets - 5 reps - sec hold    Consulted and Agree with Plan of Care Patient             Patient will benefit from skilled therapeutic intervention in order to improve  the following deficits and impairments:  Decreased endurance, Decreased knowledge of precautions, Decreased activity tolerance, Decreased strength, Increased fascial restricitons, Difficulty walking, Decreased coordination  Visit Diagnosis: Muscle weakness (generalized) - Plan: PT plan of care cert/re-cert  Other lack of coordination - Plan: PT plan of care cert/re-cert  Stress incontinence (female) (female) - Plan: PT plan of care cert/re-cert  Incontinence of feces, unspecified fecal incontinence type - Plan: PT plan of care cert/re-cert     Problem List Patient Active Problem List   Diagnosis Date Noted   Bruising tendency ([place]) 06/30/2020   Hyperlipidemia associated with type 2 diabetes mellitus ([place]) 02/13/2020   Diabetes mellitus without complication ([place]) 82/70/7867   Sleep apnea 07/11/2017   Pancreatic pseudocyst 05/10/2017   Adrenal adenoma 05/10/2017   Osteoporosis 11/21/2016   Fibromyalgia 08/10/2016   NASH (nonalcoholic steatohepatitis) 04/24/2015   Orthostatic hypotension 09/19/2013   Elevated transaminase level 09/19/2013   HTN (hypertension), benign 09/03/2013   Depression 08/14/2013   Fatty liver 08/14/2013   Other specified hypothyroidism 10/23/2012   Osteopenia 07/08/2012   Morbid obesity ([place]) 07/08/2012   Family history of rectal cancer 10/04/2011   GERD (gastroesophageal reflux disease) 10/04/2011   IBS (irritable bowel syndrome) 10/04/2011    Earlie Counts, PT 12/25/20 5:04 PM  [facility] [address], [place], [place], [address] Phone: [phone]   Fax:  [phone]  Name: YESIKA RISPOLI MRN: [id number] Date of Birth: Aug 14, 1945

## 2020-12-30 ENCOUNTER — Ambulatory Visit (INDEPENDENT_AMBULATORY_CARE_PROVIDER_SITE_OTHER): Payer: HMO

## 2020-12-30 ENCOUNTER — Other Ambulatory Visit: Payer: Self-pay

## 2020-12-30 VITALS — Ht 59.0 in | Wt 215.0 lb

## 2020-12-30 DIAGNOSIS — Z0000 Encounter for general adult medical examination without abnormal findings: Secondary | ICD-10-CM

## 2020-12-30 NOTE — Progress Notes (Signed)
Subjective:   Kristen Mack is a 75 y.o. female who presents for an Initial Medicare Annual Wellness Visit. Virtual Visit via Telephone Note  I connected with  Kristen Mack on 12/30/20 at  1:40 PM EDT by telephone and verified that I am speaking with the correct person using two identifiers.  Location: Patient: HOME  Provider: [facility] Persons participating in the virtual visit: patient/Nurse Health Advisor   I discussed the limitations, risks, security and privacy concerns of performing an evaluation and management service by telephone and the availability of in person appointments. The patient expressed understanding and agreed to proceed.  Interactive audio and video telecommunications were attempted between this nurse and patient, however failed, due to patient having technical difficulties OR patient did not have access to video capability.  We continued and completed visit with audio only.  Some vital signs may be absent or patient reported.   Chriss Driver, LPN  Review of Systems     Cardiac Risk Factors include: advanced age (>66mn, >>61women);diabetes mellitus;hypertension;dyslipidemia;obesity (BMI >30kg/m2);sedentary lifestyle     Objective:    Today's Vitals   12/30/20 1412  Weight: 215 lb (97.5 kg)  Height: 4' 11"  (1.499 m)   Body mass index is 43.42 kg/m.  Advanced Directives 12/30/2020 12/25/2020 04/25/2018 04/12/2018 04/05/2014 04/19/2013  Does Patient Have a Medical Advance Directive? No No No No No Patient does not have advance directive;Patient would not like information  Would patient like information on creating a medical advance directive? No - Patient declined No - Patient declined No - Patient declined No - Patient declined No - patient declined information -  Pre-existing out of facility DNR order (yellow form or pink MOST form) - - - - - No    Current Medications (verified) Outpatient Encounter Medications as of 12/30/2020  Medication Sig    albuterol (VENTOLIN HFA) 108 (90 Base) MCG/ACT inhaler INHALE 2 PUFFS INTO LUNGS EVERY 6 HOURS AS NEEDED FOR SHORTNESS OF BREATH   Azelastine HCl 137 MCG/SPRAY SOLN USE 2 SPRAY(S) IN EACH NOSTRIL TWICE DAILY   B Complex Vitamins (B COMPLEX 100 PO) Take 1 tablet by mouth daily.   buPROPion (WELLBUTRIN XL) 300 MG 24 hr tablet Take 1 tablet (300 mg total) by mouth daily.   Chromium 1000 MCG TABS Take 1 tablet by mouth daily.    Coenzyme Q10 (COQ10) 200 MG CAPS Take 1 capsule by mouth daily.   Cyanocobalamin (B-12) 1000 MCG SUBL Place 1 tablet under the tongue daily.    DIGESTIVE ENZYMES PO Take 1 tablet by mouth 3 (three) times daily.    diltiazem (CARDIZEM CD) 120 MG 24 hr capsule Take 1 capsule (120 mg total) by mouth daily.   glucose blood (ONETOUCH VERIO) test strip Test once daily   ipratropium (ATROVENT) 0.02 % nebulizer solution USE ONE VIAL IN NEBULIZER 4 TIMES DAILY AS NEEDED FOR SHORTNESS OF BREATH   levothyroxine (EUTHYROX) 112 MCG tablet Take 1 tablet (112 mcg total) by mouth daily.   Lysine HCl 500 MG TABS Take 1 tablet by mouth daily. Take as needed   Magnesium 250 MG TABS Take by mouth 2 (two) times daily.   meclizine (ANTIVERT) 25 MG tablet Take 25 mg by mouth 3 (three) times daily as needed for dizziness.   montelukast (SINGULAIR) 10 MG tablet Take 1 tablet (10 mg total) by mouth at bedtime.   nystatin ointment (MYCOSTATIN) Apply 1 application topically 2 (two) times daily.   OVER THE COUNTER  MEDICATION Fennel seeds. One tsp with meals   OVER THE COUNTER MEDICATION Tart cherry extract   OVER THE COUNTER MEDICATION Sleep essentials   rosuvastatin (CRESTOR) 5 MG tablet Take 1 tablet on Monday, Wednesday and Friday   sitaGLIPtin (JANUVIA) 100 MG tablet Take 1 tablet (100 mg total) by mouth daily.   spironolactone (ALDACTONE) 25 MG tablet Take 1 tablet (25 mg total) by mouth daily.   tiZANidine (ZANAFLEX) 4 MG tablet 1 qhs prn caution drowsiness   VITAMIN A PO Take 10,000 mcg by  mouth daily.    vitamin C (ASCORBIC ACID) 500 MG tablet Take 6,000 mg by mouth daily.    vitamin E 100 UNIT capsule Take 800 Units by mouth daily.    No facility-administered encounter medications on file as of 12/30/2020.    Allergies (verified) Ambien [zolpidem tartrate], Cefzil [cefprozil], Codeine, Demerol [meperidine], Formaldehyde, Latex, Sulfa antibiotics, and Celexa [citalopram hydrobromide]   History: Past Medical History:  Diagnosis Date   Allergy 1964   Arthritis    Asthma    Cataract    CFS (chronic fatigue syndrome)    Chronic kidney disease 2021   Cracked tooth    X2  RIGHT LOWER MOLAR AND LEFT UPPER MOLAR   Depression    Diabetes mellitus without complication (HCC)    Fatty liver disease, nonalcoholic    Fibromyalgia    GERD (gastroesophageal reflux disease)    History of adenomatous polyp of colon    History of cardiac murmur    until age 28   History of TIA (transient ischemic attack)    per CT scan    Hyperlipidemia    Hypertension    Hypothyroidism    IBS (irritable bowel syndrome)    Meniere disease    OSA (obstructive sleep apnea)    CPAP intolerate -- per study 2010  moderate osa--  STATES HAS RAISED HOB   Osteopenia    PMB (postmenopausal bleeding)    Prediabetes    Sleep apnea 07/11/2017   Abnormal home sleep apnea test February 2019-CPAP ordered   Stroke [facility])    Tooth loose    LOWER FRONT   Past Surgical History:  Procedure Laterality Date   [place]   and  REPAIR UMBILICAL HERNIA   COLONOSCOPY N/A 04/19/2013   Procedure: COLONOSCOPY;  Surgeon: Rogene Houston, MD;  [facility];  Service: Endoscopy;  Laterality: N/A;     ESOPHAGOGASTRODUODENOSCOPY  02/08/2003   HERNIA REPAIR  1995   HYSTEROSCOPY WITH D & C  11/14/2002   endometrial polypectomy   HYSTEROSCOPY WITH D & C N/A 04/05/2014   Procedure: DILATATION AND CURETTAGE /HYSTEROSCOPY, ENDOMETRIAL POLYPECTOMY;  Surgeon: Sanjuana Kava, MD;  Location: [place];  Service: Gynecology;  Laterality: N/A;   LIVER BIOPSY  01/29/2004   benign   TUBAL LIGATION  1979   Family History  Problem Relation Age of Onset   Rectal cancer Brother 105   Diabetes Brother    Ovarian cancer Mother 39   Hypertension Mother    Diabetes Mother    Cancer Mother    Depression Mother    Early death Mother    Obesity Mother    Hypertension Father    Diabetes Father    Depression Father    Hyperlipidemia Father    Stroke Father    Breast cancer Maternal Aunt    Breast cancer Paternal Aunt  four Paternal aunts   Heart disease Paternal Grandfather    Hyperlipidemia Paternal Grandfather    Hypertension Paternal Grandfather    Stroke Paternal Grandfather    Liver disease Sister        x 2; fatty liver   Hearing loss Maternal Grandfather    Hypertension Maternal Grandfather    Heart disease Maternal Grandmother    Hypertension Maternal Grandmother    Obesity Maternal Grandmother    Obesity Paternal Grandmother    ADD / ADHD Daughter    Depression Daughter    ADD / ADHD Daughter    Depression Daughter    Alcohol abuse Maternal Uncle    Depression Maternal Uncle    Drug abuse Maternal Uncle    Early death Maternal Uncle    Alcohol abuse Paternal Uncle    Depression Paternal Uncle    Drug abuse Paternal Uncle    Anxiety disorder Sister    Depression Sister    Hyperlipidemia Sister    Miscarriages / Stillbirths Sister    Cancer Brother    Depression Brother    Early death Brother    Cancer Maternal Aunt    Cancer Maternal Aunt    Cancer Paternal Aunt    Cancer Paternal Aunt    Cancer Paternal Aunt    Cancer Paternal Aunt    Drug abuse Brother    Heart disease Maternal Uncle    Hyperlipidemia Maternal Uncle    Hypertension Maternal Uncle    Hyperlipidemia Sister    Hyperlipidemia Brother    Hyperlipidemia Brother    Hypertension Maternal Uncle    Pancreatic cancer Neg Hx    Esophageal cancer  Neg Hx    Social History   Socioeconomic History   Marital status: Married    Spouse name: Herbie [place]   Number of children: 2   Years of education: Not on file   Highest education level: Not on file  Occupational History   Occupation: retired, Therapist, sports  Tobacco Use   Smoking status: Former    Packs/day: 1.50    Years: 3.00    Pack years: 4.50    Types: Cigarettes    Quit date: 10/03/1968    Years since quitting: 52.2   Smokeless tobacco: Never  Vaping Use   Vaping Use: Never used  Substance and Sexual Activity   Alcohol use: No   Drug use: No   Sexual activity: Not Currently    Birth control/protection: None    Comment: Husband has ED  Other Topics Concern   Not on file  Social History Narrative   2 daughters, Amy and Sharyn Lull. Amy lives with parents. Sharyn Lull lives in [place].   Social Determinants of Health   Financial Resource Strain: Low Risk    Difficulty of Paying Living Expenses: Not hard at all  Food Insecurity: No Food Insecurity   Worried About Charity fundraiser in the Last Year: Never true   [place] in the Last Year: Never true  Transportation Needs: No Transportation Needs   Lack of Transportation (Medical): No   Lack of Transportation (Non-Medical): No  Physical Activity: Insufficiently Active   Days of Exercise per Week: 3 days   Minutes of Exercise per Session: 20 min  Stress: No Stress Concern Present   Feeling of Stress : Not at all  Social Connections: Socially Integrated   Frequency of Communication with Friends and Family: More than three times a week   Frequency of Social Gatherings with Friends and  Family: More than three times a week   Attends Religious Services: 1 to 4 times per year   Active Member of Clubs or Organizations: Yes   Attends Archivist Meetings: 1 to 4 times per year   Marital Status: Married    Tobacco Counseling Counseling given: Not Answered   Clinical Intake:  Pre-visit preparation completed:  Yes  Pain : No/denies pain     BMI - recorded: 43.42 Nutritional Status: BMI > 30  Obese Nutritional Risks: None Diabetes: No  How often do you need to have someone help you when you read instructions, pamphlets, or other written materials from your doctor or pharmacy?: 1 - Never  Diabetic?Nutrition Risk Assessment:  Has the patient had any N/V/D within the last 2 months?  No  Does the patient have any non-healing wounds?  No  Has the patient had any unintentional weight loss or weight gain?  No   Diabetes:  Is the patient diabetic?  Yes  If diabetic, was a CBG obtained today?  No  Did the patient bring in their glucometer from home?  No  Phone visit.  How often do you monitor your CBG's? Daily, 162.   Financial Strains and Diabetes Management:  Are you having any financial strains with the device, your supplies or your medication? No .  Does the patient want to be seen by Chronic Care Management for management of their diabetes?  No  Would the patient like to be referred to a Nutritionist or for Diabetic Management?  No   Diabetic Exams:  Diabetic Eye Exam: Completed 07/08/2020.  Pt has been advised about the importance in completing this exam.  Diabetic Foot Exam: Completed DUE. Pt has been advised about the importance in completing this exam.    Interpreter Needed?: No  Information entered by :: MJ Siler Mavis, LPN   Activities of Daily Living In your present state of health, do you have any difficulty performing the following activities: 12/30/2020  Hearing? Y  Vision? N  Difficulty concentrating or making decisions? Y  Walking or climbing stairs? Y  Dressing or bathing? N  Doing errands, shopping? N  Preparing Food and eating ? N  Using the Toilet? N  In the past six months, have you accidently leaked urine? N  Do you have problems with loss of bowel control? N  Managing your Medications? N  Managing your Finances? N  Housekeeping or managing your Housekeeping?  N  Some recent data might be hidden    Patient Care Team: Kathyrn Drown, MD as PCP - General (Family Medicine) O'Neal, Cassie Freer, MD as PCP - Cardiology (Cardiology)  Indicate any recent Medical Services you may have received from other than [facility] providers in the past year (date may be approximate).     Assessment:   This is a routine wellness examination for [place].  Hearing/Vision screen Hearing Screening - Comments:: Some hearing issues since having Covid. Has hearing aids. Vision Screening - Comments:: Glasses. Dr. Manuella Ghazi in [place]. 07/09/2020.  Dietary issues and exercise activities discussed: Current Exercise Habits: Home exercise routine, Type of exercise: walking, Time (Minutes): 20, Frequency (Times/Week): 3, Weekly Exercise (Minutes/Week): 60, Intensity: Mild, Exercise limited by: cardiac condition(s);respiratory conditions(s)   Goals Addressed             This Visit's Progress    Have 3 meals a day       Eat a healthier diet to lose weight.        Depression  Screen PHQ 2/9 Scores 12/30/2020 10/07/2020 01/22/2020 03/06/2018 07/05/2017 06/16/2017 04/19/2017  PHQ - 2 Score 0 0 4 2 0 0 1  PHQ- 9 Score - - 10 12 - - -    Fall Risk Fall Risk  12/30/2020 10/07/2020 02/13/2020 08/01/2019 02/28/2019  Falls in the past year? 1 0 0 0 0  Comment - - - - -  Number falls in past yr: 0 - - - -  Injury with Fall? 0 - - - -  Risk for fall due to : History of fall(s);Impaired balance/gait;Impaired mobility;Impaired vision Impaired balance/gait;Impaired mobility - - Impaired balance/gait;Impaired mobility  Follow up Falls prevention discussed Falls evaluation completed - Falls evaluation completed Falls evaluation completed    FALL RISK PREVENTION PERTAINING TO THE HOME:  Any stairs in or around the home? Yes  If so, are there any without handrails? No  Home free of loose throw rugs in walkways, pet beds, electrical cords, etc? Yes  Adequate lighting in your home to reduce  risk of falls? Yes   ASSISTIVE DEVICES UTILIZED TO PREVENT FALLS:  Life alert? No  Use of a cane, walker or w/c? Yes  Grab bars in the bathroom? Yes  Shower chair or bench in shower? Yes  Elevated toilet seat or a handicapped toilet? No   TIMED UP AND GO:  Was the test performed? No . Phone visit.  Cognitive Function:     6CIT Screen 12/30/2020  What Year? 0 points  What month? 0 points  What time? 0 points  Count back from 20 0 points  Months in reverse 0 points  Repeat phrase 0 points  Total Score 0    Immunizations Immunization History  Administered Date(s) Administered   Fluad Quad(high Dose 65+) 01/22/2020   Influenza, High Dose Seasonal PF 04/15/2018, 01/13/2019   Influenza,inj,Quad PF,6+ Mos 03/02/2016, 03/03/2017   Influenza-Unspecified 04/15/2018, 01/15/2019   Moderna Sars-Covid-2 Vaccination 05/23/2019, 06/19/2019, 03/05/2020   Pneumococcal Conjugate-13 05/22/2014   Pneumococcal Polysaccharide-23 03/03/2017   Tdap 04/18/2020   Zoster Recombinat (Shingrix) 12/08/2018, 04/27/2019    TDAP status: Up to date  Flu Vaccine status: Due, Education has been provided regarding the importance of this vaccine. Advised may receive this vaccine at local pharmacy or Health Dept. Aware to provide a copy of the vaccination record if obtained from local pharmacy or Health Dept. Verbalized acceptance and understanding.  Pneumococcal vaccine status: Up to date  Covid-19 vaccine status: Information provided on how to obtain vaccines.   Qualifies for Shingles Vaccine? Yes   Zostavax completed Yes   Shingrix Completed?: Yes  Screening Tests Health Maintenance  Topic Date Due   COVID-19 Vaccine (4 - Booster for Moderna series) 05/28/2020   INFLUENZA VACCINE  10/27/2020   FOOT EXAM  04/18/2021 (Originally 05/19/1955)   HEMOGLOBIN A1C  04/04/2021   URINE MICROALBUMIN  06/25/2021   OPHTHALMOLOGY EXAM  07/09/2021   TETANUS/TDAP  04/18/2030   DEXA SCAN  Completed    Hepatitis C Screening  Completed   Zoster Vaccines- Shingrix  Completed   HPV VACCINES  Aged Out    Health Maintenance  Health Maintenance Due  Topic Date Due   COVID-19 Vaccine (4 - Booster for Moderna series) 05/28/2020   INFLUENZA VACCINE  10/27/2020    Colorectal cancer screening: Type of screening: Colonoscopy. Completed 04/19/2013. Repeat every 10 years  Mammogram status: Completed 02/20/2018. Repeat every year  Bone Density status: Completed 07/02/2020. Results reflect: Bone density results: OSTEOPOROSIS. Repeat every 2 years.  Lung Cancer  Screening: (Low Dose CT Chest recommended if Age 47-80 years, 30 pack-year currently smoking OR have quit w/in 15years.) does not qualify.     Additional Screening:  Hepatitis C Screening: does qualify; Completed 09/19/2013  Vision Screening: Recommended annual ophthalmology exams for early detection of glaucoma and other disorders of the eye. Is the patient up to date with their annual eye exam?  Yes  Who is the provider or what is the name of the office in which the patient attends annual eye exams? Dr. Manuella Ghazi in [place] If pt is not established with a provider, would they like to be referred to a provider to establish care? No .   Dental Screening: Recommended annual dental exams for proper oral hygiene  Community Resource Referral / Chronic Care Management: CRR required this visit?  No   CCM required this visit?  No      Plan:     I have personally reviewed and noted the following in the patient's chart:   Medical and social history Use of alcohol, tobacco or illicit drugs  Current medications and supplements including opioid prescriptions. Patient is not currently taking opioid prescriptions. Functional ability and status Nutritional status Physical activity Advanced directives List of other physicians Hospitalizations, surgeries, and ER visits in previous 12 months Vitals Screenings to include cognitive, depression,  and falls Referrals and appointments  In addition, I have reviewed and discussed with patient certain preventive protocols, quality metrics, and best practice recommendations. A written personalized care plan for preventive services as well as general preventive health recommendations were provided to patient.     Chriss Driver, LPN   [id number]   Nurse Notes: Pt has last mammogram done in Fall 2021 at Dr. Jonette Eva office, her GYN in [place]. Pt aware she is due for another mammogram and will contact Dr. Jonette Eva office. Pt also states she is being scheduled for a D&C due to a thickened endometrial lining. Pt state she awaiting insurance approval for her [facility] patient that if she has issues to please reach out for assistance. Pt verbalized understanding of all.  Due for diabetic foot exam.

## 2020-12-30 NOTE — Patient Instructions (Signed)
Ms. Bronder , Thank you for taking time to come for your Medicare Wellness Visit. I appreciate your ongoing commitment to your health goals. Please review the following plan we discussed and let me know if I can assist you in the future.   Screening recommendations/referrals: Colonoscopy: Done 04/19/2013 Repeat in 10 years  Mammogram: Done Fall 2021 with Dr. Ileene Rubens. Repeat annually  Bone Density: Done 07/02/2020 Osteoporosis Recommended yearly ophthalmology/optometry visit for glaucoma screening and checkup Recommended yearly dental visit for hygiene and checkup  Vaccinations: Influenza vaccine: Done 01/22/2020 Repeat annually  Pneumococcal vaccine: Done 05/22/2014 and 03/03/2017 Tdap vaccine: Done 04/18/2020 Repeat in 10 years  Shingles vaccine: Done 04/27/19 and 12/08/2018   Covid-19:Done 05/23/19, 06/19/19 and 03/05/20  Advanced directives: Advance directive discussed with you today. Even though you declined this today, please call our office should you change your mind, and we can give you the proper paperwork for you to fill out.   Conditions/risks identified: Aim for 30 minutes of exercise each day, drink 6-8 glasses of water and eat lots of fruits and vegetables.   Next appointment: Follow up in one year for your annual wellness visit 2023   Preventive Care 65 Years and Older, Female Preventive care refers to lifestyle choices and visits with your health care provider that can promote health and wellness. What does preventive care include? A yearly physical exam. This is also called an annual well check. Dental exams once or twice a year. Routine eye exams. Ask your health care provider how often you should have your eyes checked. Personal lifestyle choices, including: Daily care of your teeth and gums. Regular physical activity. Eating a healthy diet. Avoiding tobacco and drug use. Limiting alcohol use. Practicing safe sex. Taking low-dose aspirin every day. Taking vitamin and  mineral supplements as recommended by your health care provider. What happens during an annual well check? The services and screenings done by your health care provider during your annual well check will depend on your age, overall health, lifestyle risk factors, and family history of disease. Counseling  Your health care provider may ask you questions about your: Alcohol use. Tobacco use. Drug use. Emotional well-being. Home and relationship well-being. Sexual activity. Eating habits. History of falls. Memory and ability to understand (cognition). Work and work Statistician. Reproductive health. Screening  You may have the following tests or measurements: Height, weight, and BMI. Blood pressure. Lipid and cholesterol levels. These may be checked every 5 years, or more frequently if you are over 5 years old. Skin check. Lung cancer screening. You may have this screening every year starting at age 64 if you have a 30-pack-year history of smoking and currently smoke or have quit within the past 15 years. Fecal occult blood test (FOBT) of the stool. You may have this test every year starting at age 18. Flexible sigmoidoscopy or colonoscopy. You may have a sigmoidoscopy every 5 years or a colonoscopy every 10 years starting at age 69. Hepatitis C blood test. Hepatitis B blood test. Sexually transmitted disease (STD) testing. Diabetes screening. This is done by checking your blood sugar (glucose) after you have not eaten for a while (fasting). You may have this done every 1-3 years. Bone density scan. This is done to screen for osteoporosis. You may have this done starting at age 63. Mammogram. This may be done every 1-2 years. Talk to your health care provider about how often you should have regular mammograms. Talk with your health care provider about your test results,  treatment options, and if necessary, the need for more tests. Vaccines  Your health care provider may recommend  certain vaccines, such as: Influenza vaccine. This is recommended every year. Tetanus, diphtheria, and acellular pertussis (Tdap, Td) vaccine. You may need a Td booster every 10 years. Zoster vaccine. You may need this after age 49. Pneumococcal 13-valent conjugate (PCV13) vaccine. One dose is recommended after age 54. Pneumococcal polysaccharide (PPSV23) vaccine. One dose is recommended after age 14. Talk to your health care provider about which screenings and vaccines you need and how often you need them. This information is not intended to replace advice given to you by your health care provider. Make sure you discuss any questions you have with your health care provider. Document Released: 04/11/2015 Document Revised: 12/03/2015 Document Reviewed: 01/14/2015 Elsevier Interactive Patient Education  2017 [place] Prevention in the Home Falls can cause injuries. They can happen to people of all ages. There are many things you can do to make your home safe and to help prevent falls. What can I do on the outside of my home? Regularly fix the edges of walkways and driveways and fix any cracks. Remove anything that might make you trip as you walk through a door, such as a raised step or threshold. Trim any bushes or trees on the path to your home. Use bright outdoor lighting. Clear any walking paths of anything that might make someone trip, such as rocks or tools. Regularly check to see if handrails are loose or broken. Make sure that both sides of any steps have handrails. Any raised decks and porches should have guardrails on the edges. Have any leaves, snow, or ice cleared regularly. Use sand or salt on walking paths during winter. Clean up any spills in your garage right away. This includes oil or grease spills. What can I do in the bathroom? Use night lights. Install grab bars by the toilet and in the tub and shower. Do not use towel bars as grab bars. Use non-skid mats or  decals in the tub or shower. If you need to sit down in the shower, use a plastic, non-slip stool. Keep the floor dry. Clean up any water that spills on the floor as soon as it happens. Remove soap buildup in the tub or shower regularly. Attach bath mats securely with double-sided non-slip rug tape. Do not have throw rugs and other things on the floor that can make you trip. What can I do in the bedroom? Use night lights. Make sure that you have a light by your bed that is easy to reach. Do not use any sheets or blankets that are too big for your bed. They should not hang down onto the floor. Have a firm chair that has side arms. You can use this for support while you get dressed. Do not have throw rugs and other things on the floor that can make you trip. What can I do in the kitchen? Clean up any spills right away. Avoid walking on wet floors. Keep items that you use a lot in easy-to-reach places. If you need to reach something above you, use a strong step stool that has a grab bar. Keep electrical cords out of the way. Do not use floor polish or wax that makes floors slippery. If you must use wax, use non-skid floor wax. Do not have throw rugs and other things on the floor that can make you trip. What can I do with my stairs? Do  not leave any items on the stairs. Make sure that there are handrails on both sides of the stairs and use them. Fix handrails that are broken or loose. Make sure that handrails are as long as the stairways. Check any carpeting to make sure that it is firmly attached to the stairs. Fix any carpet that is loose or worn. Avoid having throw rugs at the top or bottom of the stairs. If you do have throw rugs, attach them to the floor with carpet tape. Make sure that you have a light switch at the top of the stairs and the bottom of the stairs. If you do not have them, ask someone to add them for you. What else can I do to help prevent falls? Wear shoes that: Do not  have high heels. Have rubber bottoms. Are comfortable and fit you well. Are closed at the toe. Do not wear sandals. If you use a stepladder: Make sure that it is fully opened. Do not climb a closed stepladder. Make sure that both sides of the stepladder are locked into place. Ask someone to hold it for you, if possible. Clearly mark and make sure that you can see: Any grab bars or handrails. First and last steps. Where the edge of each step is. Use tools that help you move around (mobility aids) if they are needed. These include: Canes. Walkers. Scooters. Crutches. Turn on the lights when you go into a dark area. Replace any light bulbs as soon as they burn out. Set up your furniture so you have a clear path. Avoid moving your furniture around. If any of your floors are uneven, fix them. If there are any pets around you, be aware of where they are. Review your medicines with your doctor. Some medicines can make you feel dizzy. This can increase your chance of falling. Ask your doctor what other things that you can do to help prevent falls. This information is not intended to replace advice given to you by your health care provider. Make sure you discuss any questions you have with your health care provider. Document Released: 01/09/2009 Document Revised: 08/21/2015 Document Reviewed: 04/19/2014 Elsevier Interactive Patient Education  2017 [employer].

## 2021-01-01 ENCOUNTER — Telehealth: Payer: Self-pay | Admitting: *Deleted

## 2021-01-01 ENCOUNTER — Encounter: Payer: Self-pay | Admitting: Family Medicine

## 2021-01-01 DIAGNOSIS — M810 Age-related osteoporosis without current pathological fracture: Secondary | ICD-10-CM

## 2021-01-01 NOTE — Telephone Encounter (Signed)
See my chart message Everything that we are suggesting is based around trying to be safe for the patient

## 2021-01-01 NOTE — Telephone Encounter (Signed)
My chart message sent to patient. Referral sent to Endo

## 2021-01-01 NOTE — Telephone Encounter (Signed)
Nurses  Several things #1 I would need for a metabolic 7 from the kidney doctor that she spoke.  It would have to be within 30 days of Reclast in order to get it approved otherwise we need to do another metabolic 7  It is important for Tkai to realize that if the kidney function is not in the right zone taking Reclast could cause damage.  #2 if a up-to-date kidney function test does not show that she can safely take Reclast then Prolia is an option but that would be a medication that is administered by endocrinology such as Dr. Dorris Fetch.  The kidney doctor would not be interested in treating osteoporosis.  Our office does not do Prolia.  So at this point need metabolic 7 that has been done within the past 30 days if not recommend a new metabolic 7 through [facility]

## 2021-01-01 NOTE — Telephone Encounter (Signed)
Pt replied via my chart: I am Dr Liliane Channel patient.Why do I need to talk to him instead of the Kidney doctor? [facility] last month GFR 35 per patient. Please advise. Thank you.  My chart message also sent to provider

## 2021-01-01 NOTE — Telephone Encounter (Addendum)
Completing form for Patient concerning Reclast therapy. Patient's GFR is 29 From states Reclast can NOT be administered if GFR is not greater than or equal to 35- please advise

## 2021-01-01 NOTE — Telephone Encounter (Signed)
Per guidelines at [facility]  If GFR is less than 35 cannot do Reclast Her GFR is 27 (She stated that her kidney doctor told her she could utilize medicine but the guidelines do not give room for exceptions therefore they cannot do Reclast) I would recommend referral to endocrinology for consideration of Prolia, diagnosis osteoporosis

## 2021-01-01 NOTE — Telephone Encounter (Signed)
Results from [facility] received. Results in provider office. Please advise. Thank you. (Most recent highlighted on 2nd page)

## 2021-01-01 NOTE — Telephone Encounter (Signed)
Pt is having results of recent GFR faxed over from [facility]

## 2021-01-02 NOTE — Telephone Encounter (Signed)
May go ahead and try to get Reclast approved  Please talk with coordinator of the infusion program.  Patient's GFR on 12/17/2020 with her kidney doctor was 75.  Yet the GFR on recent lab work through [facility] was 27.  So how recent does the GFR/met 7 need to be?  And in a situation like this does the GFR that may follow need to be the one from the kidney doctor or the one through [facility]?  Thank you for the efforts sorry for the hassle

## 2021-01-02 NOTE — Telephone Encounter (Signed)
Please see telephone message

## 2021-01-02 NOTE — Telephone Encounter (Signed)
Patient states she is seen in [place] office and sees the doctor their for that day- she has seen Kurtis Bushman and [place]. Patient states if the Reclast is truly that hard on her kidneys she is willing to go with the Prolia if it is better- she would like to do it in [place] with Dr Dorris Fetch.

## 2021-01-06 ENCOUNTER — Telehealth: Payer: Self-pay

## 2021-01-06 NOTE — Telephone Encounter (Signed)
Dr Dorris Fetch, This patient is already established here but Dr Wolfgang Phoenix would like you to over see her for Osteoporosis without current pathological fracture. She is scheduled to already see you November 12 for other DX's. Can this wait and be addressed then or do you want to see her sooner? Please Advise and I will call pt. Thanks

## 2021-01-07 NOTE — Telephone Encounter (Signed)
Called pt no answer. If pt calls back, she can address this in November per Dr Dorris Fetch

## 2021-01-09 ENCOUNTER — Other Ambulatory Visit: Payer: Self-pay | Admitting: Cardiovascular Disease

## 2021-01-21 ENCOUNTER — Ambulatory Visit (INDEPENDENT_AMBULATORY_CARE_PROVIDER_SITE_OTHER): Payer: HMO | Admitting: "Endocrinology

## 2021-01-21 ENCOUNTER — Other Ambulatory Visit: Payer: Self-pay

## 2021-01-21 ENCOUNTER — Encounter: Payer: Self-pay | Admitting: "Endocrinology

## 2021-01-21 VITALS — BP 124/78 | HR 64 | Ht 59.0 in | Wt 218.6 lb

## 2021-01-21 DIAGNOSIS — E782 Mixed hyperlipidemia: Secondary | ICD-10-CM

## 2021-01-21 DIAGNOSIS — E119 Type 2 diabetes mellitus without complications: Secondary | ICD-10-CM

## 2021-01-21 DIAGNOSIS — D3502 Benign neoplasm of left adrenal gland: Secondary | ICD-10-CM | POA: Diagnosis not present

## 2021-01-21 DIAGNOSIS — E038 Other specified hypothyroidism: Secondary | ICD-10-CM

## 2021-01-21 LAB — POCT GLYCOSYLATED HEMOGLOBIN (HGB A1C): HbA1c, POC (controlled diabetic range): 7.1 % — AB (ref 0.0–7.0)

## 2021-01-21 NOTE — Progress Notes (Signed)
01/21/2021, 5:38 PM     Endocrinology follow-up note   Subjective:    Patient ID: Kristen Mack, female    DOB: May 26, 1945, PCP Kathyrn Drown, MD   Past Medical History:  Diagnosis Date   Allergy 1964   Arthritis    Asthma    Cataract    CFS (chronic fatigue syndrome)    Chronic kidney disease 2021   Cracked tooth    X2  RIGHT LOWER MOLAR AND LEFT UPPER MOLAR   Depression    Diabetes mellitus without complication ([place])    Fatty liver disease, nonalcoholic    Fibromyalgia    GERD (gastroesophageal reflux disease)    History of adenomatous polyp of colon    History of cardiac murmur    until age 32   History of TIA (transient ischemic attack)    per CT scan    Hyperlipidemia    Hypertension    Hypothyroidism    IBS (irritable bowel syndrome)    Meniere disease    OSA (obstructive sleep apnea)    CPAP intolerate -- per study 2010  moderate osa--  STATES HAS RAISED HOB   Osteopenia    PMB (postmenopausal bleeding)    Prediabetes    Sleep apnea 07/11/2017   Abnormal home sleep apnea test February 2019-CPAP ordered   Stroke [facility])    Tooth loose    LOWER FRONT   Past Surgical History:  Procedure Laterality Date   [place]   and  REPAIR UMBILICAL HERNIA   COLONOSCOPY N/A 04/19/2013   Procedure: COLONOSCOPY;  Surgeon: Rogene Houston, MD;  [facility];  Service: Endoscopy;  Laterality: N/A;     ESOPHAGOGASTRODUODENOSCOPY  02/08/2003   HERNIA REPAIR  1995   HYSTEROSCOPY WITH D & C  11/14/2002   endometrial polypectomy   HYSTEROSCOPY WITH D & C N/A 04/05/2014   Procedure: DILATATION AND CURETTAGE /HYSTEROSCOPY, ENDOMETRIAL POLYPECTOMY;  Surgeon: Sanjuana Kava, MD;  Location: [place];  Service: Gynecology;  Laterality: N/A;   LIVER BIOPSY  01/29/2004   benign   TUBAL LIGATION  1979   Social History   Socioeconomic History    Marital status: Married    Spouse name: Herbie Baltimore   Number of children: 2   Years of education: Not on file   Highest education level: Not on file  Occupational History   Occupation: retired, Therapist, sports  Tobacco Use   Smoking status: Former    Packs/day: 1.50    Years: 3.00    Pack years: 4.50    Types: Cigarettes    Quit date: 10/03/1968    Years since quitting: 52.3   Smokeless tobacco: Never  Vaping Use   Vaping Use: Never used  Substance and Sexual Activity   Alcohol use: No   Drug use: No   Sexual activity: Not Currently    Birth control/protection: None    Comment: Husband has ED  Other Topics Concern   Not on file  Social History Narrative   2 daughters, Amy and Sharyn Lull. Amy lives with parents. Sharyn Lull lives in [place].   Social Determinants of Radio broadcast assistant  Strain: Low Risk    Difficulty of Paying Living Expenses: Not hard at all  Food Insecurity: No Food Insecurity   Worried About Charity fundraiser in the Last Year: Never true   Ran Out of Food in the Last Year: Never true  Transportation Needs: No Transportation Needs   Lack of Transportation (Medical): No   Lack of Transportation (Non-Medical): No  Physical Activity: Insufficiently Active   Days of Exercise per Week: 3 days   Minutes of Exercise per Session: 20 min  Stress: No Stress Concern Present   Feeling of Stress : Not at all  Social Connections: Socially Integrated   Frequency of Communication with Friends and Family: More than three times a week   Frequency of Social Gatherings with Friends and Family: More than three times a week   Attends Religious Services: 1 to 4 times per year   Active Member of [employer] or Organizations: Yes   Attends Archivist Meetings: 1 to 4 times per year   Marital Status: Married   Outpatient Encounter Medications as of 01/21/2021  Medication Sig   albuterol (VENTOLIN HFA) 108 (90 Base) MCG/ACT inhaler INHALE 2 PUFFS INTO LUNGS EVERY 6 HOURS AS NEEDED  FOR SHORTNESS OF BREATH   Azelastine HCl 137 MCG/SPRAY SOLN USE 2 SPRAY(S) IN EACH NOSTRIL TWICE DAILY   B Complex Vitamins (B COMPLEX 100 PO) Take 1 tablet by mouth daily.   buPROPion (WELLBUTRIN XL) 300 MG 24 hr tablet Take 1 tablet (300 mg total) by mouth daily.   Chromium 1000 MCG TABS Take 1 tablet by mouth daily.    Coenzyme Q10 (COQ10) 200 MG CAPS Take 1 capsule by mouth daily.   Cyanocobalamin (B-12) 1000 MCG SUBL Place 1 tablet under the tongue daily.    DIGESTIVE ENZYMES PO Take 1 tablet by mouth 3 (three) times daily.    diltiazem (CARDIZEM CD) 120 MG 24 hr capsule Take 1 capsule by mouth once daily   glucose blood (ONETOUCH VERIO) test strip Test once daily   ipratropium (ATROVENT) 0.02 % nebulizer solution USE ONE VIAL IN NEBULIZER 4 TIMES DAILY AS NEEDED FOR SHORTNESS OF BREATH   levothyroxine (EUTHYROX) 112 MCG tablet Take 1 tablet (112 mcg total) by mouth daily.   Lysine HCl 500 MG TABS Take 1 tablet by mouth daily. Take as needed   Magnesium 250 MG TABS Take by mouth 2 (two) times daily.   meclizine (ANTIVERT) 25 MG tablet Take 25 mg by mouth 3 (three) times daily as needed for dizziness.   montelukast (SINGULAIR) 10 MG tablet Take 1 tablet (10 mg total) by mouth at bedtime.   nystatin ointment (MYCOSTATIN) Apply 1 application topically 2 (two) times daily.   OVER THE COUNTER MEDICATION Fennel seeds. One tsp with meals   OVER THE COUNTER MEDICATION Tart cherry extract   OVER THE COUNTER MEDICATION Sleep essentials   rosuvastatin (CRESTOR) 5 MG tablet Take 1 tablet on Monday, Wednesday and Friday   sitaGLIPtin (JANUVIA) 100 MG tablet Take 1 tablet (100 mg total) by mouth daily.   spironolactone (ALDACTONE) 25 MG tablet Take 1 tablet (25 mg total) by mouth daily.   tiZANidine (ZANAFLEX) 4 MG tablet 1 qhs prn caution drowsiness   VITAMIN A PO Take 10,000 mcg by mouth daily.    vitamin C (ASCORBIC ACID) 500 MG tablet Take 6,000 mg by mouth daily.    vitamin E 100 UNIT capsule  Take 800 Units by mouth daily.    No  facility-administered encounter medications on file as of 01/21/2021.   ALLERGIES: Allergies  Allergen Reactions   Ambien [Zolpidem Tartrate] Other (See Comments)    Side effect "last a couple days"   Cefzil [Cefprozil]     diarrhea   Codeine Itching and Nausea And Vomiting   Demerol [Meperidine] Other (See Comments)    "messes up senses"   Formaldehyde    Latex Other (See Comments)    "skin gets raw"   Sulfa Antibiotics Other (See Comments)    Severe abd cramp   Celexa [Citalopram Hydrobromide] Other (See Comments)    Mouth sores    VACCINATION STATUS: Immunization History  Administered Date(s) Administered   Fluad Quad(high Dose 65+) 01/22/2020   Influenza, High Dose Seasonal PF 04/15/2018, 01/13/2019   Influenza,inj,Quad PF,6+ Mos 03/02/2016, 03/03/2017   Influenza-Unspecified 04/15/2018, 01/15/2019   Moderna Sars-Covid-2 Vaccination 05/23/2019, 06/19/2019, 03/05/2020   Pneumococcal Conjugate-13 05/22/2014   Pneumococcal Polysaccharide-23 03/03/2017   Tdap 04/18/2020   Zoster Recombinat (Shingrix) 12/08/2018, 04/27/2019    HPI RAYNETTE ARRAS is 75 y.o. female who is accompanied by her daughter to clinic.  She usually follows in this clinic for stable, nonfunctioning 1.5 cm left adrenal adenoma which was previously worked up completely.  This visit is because of her concern about rising blood glucose readings.  She presents her logs which show blood glucose readings between 140-165 at fasting.  Her point-of-care A1c is 7.1% unchanged from her last visit A1c was 7%.  She is on Januvia 100 mg p.o. daily at breakfast.   She is also on levothyroxine for hypothyroidism, currently 112 mcg p.o. daily before breakfast.  She reports compliance on medications.  She has no other new complaints. -In April 2022,  she underwent surveillance MRI abdomen/pelvis which documented stable adrenal finding.  This study was done to follow-up  pancreatic cyst which was also reported to be stable and favoring benign findings.   -Her  previous 24-hour urine function studies for adrenal function have been within normal limits .  -She denies history of difficult to control hypertension, in fact has history of orthostatic hypotension.  She denies spells of palpitations, headaches, sweating. -She denies close  family history of pituitary, adrenal dysfunctions.  Review of Systems  Limited as above.  Objective:    BP 124/78   Pulse 64   Ht 4' 11"  (1.499 m)   Wt 218 lb 9.6 oz (99.2 kg)   BMI 44.15 kg/m   Wt Readings from Last 3 Encounters:  01/21/21 218 lb 9.6 oz (99.2 kg)  12/30/20 215 lb (97.5 kg)  12/02/20 215 lb (97.5 kg)    Recent Results (from the past 2160 hour(s))  POCT Urinalysis Dipstick     Status: Abnormal   Collection Time: 12/02/20  2:45 PM  Result Value Ref Range   Color, UA Yellow    Clarity, UA Clear    Glucose, UA Negative Negative   Bilirubin, UA Negative    Ketones, UA Negative    Spec Grav, UA 1.020 1.010 - 1.025   Blood, UA Negative    pH, UA 6.0 5.0 - 8.0   Protein, UA Negative Negative   Urobilinogen, UA 0.2 0.2 or 1.0 E.U./dL   Nitrite, UA Negative    Leukocytes, UA Small (1+) (A) Negative   Appearance abnormal     Comment: bright yellow   Odor None   HgB A1c     Status: Abnormal   Collection Time: 01/21/21  3:40 PM  Result Value Ref  Range   Hemoglobin A1C     HbA1c POC (<> result, manual entry)     HbA1c, POC (prediabetic range)     HbA1c, POC (controlled diabetic range) 7.1 (A) 0.0 - 7.0 %    Diabetic Labs (most recent): Lab Results  Component Value Date   HGBA1C 7.1 (A) 01/21/2021   HGBA1C 7.0 (H) 10/02/2020   HGBA1C 6.6 (H) 06/25/2020     Lipid Panel ( most recent) Lipid Panel     Component Value Date/Time   CHOL 176 10/02/2020 1640   TRIG 180 (H) 10/02/2020 1640   HDL 46 10/02/2020 1640   CHOLHDL 3.8 10/02/2020 1640   CHOLHDL 3.3 05/07/2014 1122   VLDL 24 05/07/2014  1122   LDLCALC 99 10/02/2020 1640          MRI of abdomen on May 04, 2017:  Normal right adrenal gland. Left adrenal nodule on the order of 1.5 cm is consistent with an adenoma.   Surveillance abdominal MRI from December 25, 2019 IMPRESSION: 1. Motion degraded exam shows multiple tiny cystic lesions in the pancreas. Dominant lesion has minimally increased from 14 mm to 17 mm in the 2.5 year interval since previous MRI. No gross soft tissue component or abnormal enhancement evident on today's markedly motion degraded exam. At 17 mm, consensus guidelines recommend repeat imaging every 6 months for 2 years to ensure stability. This recommendation follows ACR consensus guidelines: Management of Incidental Pancreatic Cysts: A White Paper of the ACR Incidental Findings Committee. [place] 9892;11:941-740. 2. Additional tiny 6 mm cystic foci in the body/tail region are new. Attention on follow-up recommended. 3. Abnormal fluid in the endometrial cavity of the uterus. Pelvic ultrasound for further evaluation. 4. Stable left adrenal adenoma. 5. Small hiatal hernia.   MRI of abdomen/pelvis on July 15, 2020 IMPRESSION: Motion degraded images.   16 mm unilocular pancreatic cyst, unchanged. This favors a benign pseudocyst or side branch IPMN and is of questionable clinical significance. Consider follow-up MR or CT abdomen with/without contrast in 1 year, as clinically warranted. (MR is generally favored, but CT may be preferred given motion concerns).   Suspected endometrial soft tissue/thickening, incompletely visualized/evaluated. This continues to raise concern for early endometrial neoplasm. GYN consultation is suggested.   These results will be called to the ordering clinician or representative by the Radiologist Assistant, and communication documented in the PACS or [employer].  Assessment & Plan:   1.  Type 2 diabetes- -Well-controlled, with  point-of-care A1c of 7.1%.  I discussed her options including my recommended option of not adding any more medications.  We discussed on lifestyle interventions including [employer] plant-based lifestyle nutrition with less processed carbs and less animal products. She will continue on Januvia 100 mg p.o. daily at breakfast.  She will not need insulin treatment for now.  She will continue to monitor blood glucose at least at fasting. - she acknowledges that there is a room for improvement in her food and drink choices. - Suggestion is made for her to avoid simple carbohydrates  from her diet including Cakes, Sweet Desserts, Ice Cream, Soda (diet and regular), Sweet Tea, Candies, Chips, Cookies, Store Bought Juices, Alcohol in Excess of  1-2 drinks a day, Artificial Sweeteners,  Coffee Creamer, and "Sugar-free" Products, Lemonade. This will help patient to have more stable blood glucose profile and potentially avoid unintended weight gain.    2.  Adenoma of left adrenal gland I reviewed her interval labs, imaging studies with  her and her daughter in the exam room.    The left adrenal gland is reported to have mild thickening, right adrenal gland is normal.    -Her previsit plasma metanephrines are within normal limits.   -She has previously undergone 24-hour urine studies for catecholamines, metanephrines, cortisol, and aldosterone, which are all within normal limits.  -This is consistent with nonfunctioning left adrenal adenoma of 1.5 cm, stable on imaging studies.   -She will not require any specific treatment at this time. -Related to her pancreatic cysts/pseudocysts she is scheduled to have MRI every 6 months, will use those  imaging studies to follow-up on the adrenal adenoma.      3.  Hypothyroidism  Her levothyroxine was continued at 112 mcg p.o. daily before breakfast.  Her previsit thyroid function tests are consistent with appropriate replacement.  - We discussed about the correct  intake of her thyroid hormone, on empty stomach at fasting, with water, separated by at least 30 minutes from breakfast and other medications,  and separated by more than 4 hours from calcium, iron, multivitamins, acid reflux medications (PPIs). -Patient is made aware of the fact that thyroid hormone replacement is needed for life, dose to be adjusted by periodic monitoring of thyroid function tests.   - I advised patient to maintain close follow up with Kathyrn Drown, MD for primary care needs.   I spent 33 minutes in the care of the patient today including review of labs from Thyroid Function, glycemic profile, CMP, and other relevant labs ; imaging/biopsy records (current and previous including abstractions from other facilities); face-to-face time discussing  her lab results and symptoms, medications doses, her options of short and long term treatment based on the latest standards of care / guidelines;   and documenting the encounter.  [name]  participated in the discussions, expressed understanding, and voiced agreement with the above plans.  All questions were answered to her satisfaction. she is encouraged to contact clinic should she have any questions or concerns prior to her return visit.   Follow up plan: Return for Keep Reg. Appt. with Pre-visit Labs.   Glade Lloyd, MD [facility] [address] [place], [place] [address] Phone: [phone]  Fax: [phone]     01/21/2021, 5:38 PM  This note was partially dictated with voice recognition software. Similar sounding words can be transcribed inadequately or may not  be corrected upon review.

## 2021-01-21 NOTE — Patient Instructions (Signed)

## 2021-01-22 ENCOUNTER — Other Ambulatory Visit: Payer: Self-pay | Admitting: Family Medicine

## 2021-01-22 ENCOUNTER — Encounter: Payer: HMO | Admitting: Physical Therapy

## 2021-01-29 ENCOUNTER — Other Ambulatory Visit: Payer: Self-pay

## 2021-01-29 ENCOUNTER — Encounter: Payer: HMO | Attending: Family Medicine | Admitting: Physical Therapy

## 2021-01-29 ENCOUNTER — Encounter: Payer: Self-pay | Admitting: Physical Therapy

## 2021-01-29 DIAGNOSIS — R278 Other lack of coordination: Secondary | ICD-10-CM | POA: Diagnosis not present

## 2021-01-29 DIAGNOSIS — R159 Full incontinence of feces: Secondary | ICD-10-CM | POA: Diagnosis not present

## 2021-01-29 DIAGNOSIS — N393 Stress incontinence (female) (male): Secondary | ICD-10-CM | POA: Insufficient documentation

## 2021-01-29 DIAGNOSIS — M6281 Muscle weakness (generalized): Secondary | ICD-10-CM | POA: Diagnosis not present

## 2021-01-29 NOTE — Therapy (Signed)
[facility] at [facility] [address], [place], [place], [address] Phone: [phone]   Fax:  [phone]  Physical Therapy Treatment  Patient Details  Name: Kristen Mack MRN: [id number] Date of Birth: 1946/02/22 Referring Provider (PT): Dr. Sherlene Shams   Encounter Date: 01/29/2021   PT End of Session - 01/29/21 1351     Visit Number 2    Date for PT Re-Evaluation 03/19/21    Authorization Type heatlhteam advantage    Authorization - Visit Number 2    Authorization - Number of Visits 10    PT Start Time 1300    PT Stop Time 1350    PT Time Calculation (min) 50 min    Activity Tolerance Patient tolerated treatment well    Behavior During Therapy [facility] for tasks assessed/performed             Past Medical History:  Diagnosis Date   Allergy 1964   Arthritis    Asthma    Cataract    CFS (chronic fatigue syndrome)    Chronic kidney disease 2021   Cracked tooth    X2  RIGHT LOWER MOLAR AND LEFT UPPER MOLAR   Depression    Diabetes mellitus without complication ([place])    Fatty liver disease, nonalcoholic    Fibromyalgia    GERD (gastroesophageal reflux disease)    History of adenomatous polyp of colon    History of cardiac murmur    until age 18   History of TIA (transient ischemic attack)    per CT scan    Hyperlipidemia    Hypertension    Hypothyroidism    IBS (irritable bowel syndrome)    Meniere disease    OSA (obstructive sleep apnea)    CPAP intolerate -- per study 2010  moderate osa--  STATES HAS RAISED HOB   Osteopenia    PMB (postmenopausal bleeding)    Prediabetes    Sleep apnea 07/11/2017   Abnormal home sleep apnea test February 2019-CPAP ordered   Stroke [facility])    Tooth loose    LOWER FRONT    Past Surgical History:  Procedure Laterality Date   [place]   and  REPAIR UMBILICAL HERNIA   COLONOSCOPY N/A 04/19/2013   Procedure:  COLONOSCOPY;  Surgeon: Rogene Houston, MD;  [facility];  Service: Endoscopy;  Laterality: N/A;     ESOPHAGOGASTRODUODENOSCOPY  02/08/2003   HERNIA REPAIR  1995   HYSTEROSCOPY WITH D & C  11/14/2002   endometrial polypectomy   HYSTEROSCOPY WITH D & C N/A 04/05/2014   Procedure: DILATATION AND CURETTAGE /HYSTEROSCOPY, ENDOMETRIAL POLYPECTOMY;  Surgeon: Sanjuana Kava, MD;  Location: [place];  Service: Gynecology;  Laterality: N/A;   LIVER BIOPSY  01/29/2004   benign   TUBAL LIGATION  1979    There were no vitals filed for this visit.   Subjective Assessment - 01/29/21 1306     Subjective I am not gusing urine. At night i am able to contract thepelvic floor prior to standing. During the day the leakage is 50% better.    Patient is accompained by: Family member    Patient Stated Goals activate core muscles; stand up and not flood her diaper;    Currently in Pain? No/denies  Pelvic Floor Special Questions - 01/29/21 0001     Exam Type Deferred               [facility] Adult PT Treatment/Exercise - 01/29/21 0001       Self-Care   Self-Care Other Self-Care Comments    Other Self-Care Comments  discussed with patietn on urge to void behavoral technique to delay urge at night. Timed voiding every 2 hours during the day. Drinking habits to stop water at 8 and no caffiene after 5.      Lumbar Exercises: Stretches   Single Knee to Chest Stretch Right;Left;1 rep;30 seconds    Single Knee to Chest Stretch Limitations sidely    Lower Trunk Rotation 2 reps;30 seconds    Lower Trunk Rotation Limitations left and right supine    Piriformis Stretch Right;Left;2 reps;30 seconds    Piriformis Stretch Limitations supine      Lumbar Exercises: Supine   Clam 10 reps;1 second    Clam Limitations each leg with VC to go slowly, and contract the pelvic floor      Lumbar Exercises: Sidelying   Clam Right;Left;10 reps;1 second     Clam Limitations with pelvic floor contraction, verbal cues to not move trunk                     PT Education - 01/29/21 1347     Education Details Access Code: [email or web address]  ; urge to void, voiding every 2 hours, no fluids after 8 and no caffiene after 5.    Person(s) Educated Child(ren);Patient    Methods Explanation;Handout;Demonstration    Comprehension Verbalized understanding;Returned demonstration              PT Short Term Goals - 01/29/21 1356       PT SHORT TERM GOAL #1   Title Patient will report understanding and regular compliance of HEP to improve patient's pelvic floor strength and coordination    Time 4    Period Weeks    Status Achieved    Target Date 01/22/21      PT SHORT TERM GOAL #2   Title understand bladder irritatns to reduce urinary leakage    Time 4    Period Weeks    Status Achieved    Target Date 01/22/21               PT Long Term Goals - 12/25/20 1657       PT LONG TERM GOAL #1   Title Patient will demonstrate improvement of at least 1 MMT strength grade in hip muscles to help improve pelvic floor strength and ability to walk to the commode in time [place] to the bathroom    Baseline --    Time 12    Period Weeks    Status New    Target Date 03/19/21      PT LONG TERM GOAL #2   Title able to wear 1 depned during the day and at night due to reduction of urinary leakage >/= 50%    Baseline ---    Time 12    Period Weeks    Status New    Target Date 03/19/21      PT LONG TERM GOAL #3   Title able to go from sit to stand with urinary leakage decreased by 50% due to increased pelvic floor coordination and strength    Baseline --    Time 12    Period Weeks  Status New    Target Date 03/19/21      PT LONG TERM GOAL #4   Title reduced her tea intake by 50% so she is able to see a change in her urinary leakage and increased her water intake to 4 bottlse of water    Time 12    Period Weeks    Status New    Target  Date 03/19/21      PT LONG TERM GOAL #5   Title fecal leakage decreased >/= 50% due to increased pelvic floor strength    Time 12    Period Weeks    Status New    Target Date 03/19/21                   Plan - 01/29/21 1352     Clinical Impression Statement Patient reports her urinary leakage is 60% better at night and 50% better during the day. She is not gushing urine at night. She is able to contract the pelvic floor prior to getting out of bed. Patient does not wear a pad 2-3 nights out of the week. She will urinate every hour so went over urge to void to decrease to 2 times per night. Patient will try urinating every 2 hours during the day to reduce the urination at night. Patient exercises include the pelvic floor contraction and how to do stretches wihtout pressure on the abdomen. Patient will benefit from skilled therapy to improve pelvic floor strength and coordination to reduce urinary and fecal leakage.    Personal Factors and Comorbidities Comorbidity 3+;Past/Current Experience;Fitness;Age    Comorbidities Osteoporosis; Fibromyalgia; hypothyroidism, TIA; c-section x2    Examination-Activity Limitations Sit;Stand;Toileting;Continence;Transfers;Lift;Locomotion Level    Examination-Participation Restrictions Community Activity    Stability/Clinical Decision Making Evolving/Moderate complexity    Rehab Potential Good    PT Frequency 1x / week    PT Duration 12 weeks    PT Treatment/Interventions ADLs/Self Care Home Management;Biofeedback;Therapeutic activities;Therapeutic exercise;Neuromuscular re-education;Patient/family education;Manual techniques    PT Next Visit Plan see how the voiding every 2 hours go; add hip adductor in sidely, see how nighttime voids are going; sitting pelvic floor contraction; ask about fecal leakage    PT Home Exercise Plan Access Code: [email or web address]    Recommended Other Services MDsigned initial note    Consulted and Agree with Plan of Care Patient              Patient will benefit from skilled therapeutic intervention in order to improve the following deficits and impairments:  Decreased endurance, Decreased knowledge of precautions, Decreased activity tolerance, Decreased strength, Increased fascial restricitons, Difficulty walking, Decreased coordination  Visit Diagnosis: Muscle weakness (generalized)  Other lack of coordination  Stress incontinence (female) (female)  Incontinence of feces, unspecified fecal incontinence type     Problem List Patient Active Problem List   Diagnosis Date Noted   Bruising tendency 06/30/2020   Mixed hyperlipidemia 02/13/2020   Diabetes mellitus without complication ([place]) 65/78/4696   Sleep apnea 07/11/2017   Pancreatic pseudocyst 05/10/2017   Adrenal adenoma 05/10/2017   Osteoporosis 11/21/2016   Fibromyalgia 08/10/2016   Female stress incontinence 11/05/2015   NASH (nonalcoholic steatohepatitis) 04/24/2015   Orthostatic hypotension 09/19/2013   Elevated transaminase level 09/19/2013   HTN (hypertension), benign 09/03/2013   Depression 08/14/2013   Fatty liver 08/14/2013   Other specified hypothyroidism 10/23/2012   Osteopenia 07/08/2012   Morbid obesity ([place]) 07/08/2012   Urinary bladder incontinence 04/30/2012   Family history of rectal cancer  10/04/2011   GERD (gastroesophageal reflux disease) 10/04/2011   IBS (irritable bowel syndrome) 10/04/2011   Multiple chemical sensitivity syndrome 11/27/1968    Earlie Counts, PT 01/29/21 1:58 PM  [place] [facility] at [facility] [address] [place], [place], [address] Phone: [phone]   Fax:  [phone]  Name: Kristen Mack MRN: [id number] Date of Birth: Mar 16, 1946

## 2021-01-29 NOTE — Patient Instructions (Addendum)
Go to the bathroom every 2 hours for timed voiding.   Try to not drink water after 8 at night.   No caffeine beverages past 5  Urge Incontinence  Ideal urination frequency is every 2-4 wakeful hours, which equates to 5-8 times within a 24-hour period.  At night is 1-2 times waking up.  Urge incontinence is leakage that occurs when the bladder muscle contracts, creating a sudden need to go before getting to the bathroom.   Going too often when your bladder isn't actually full can disrupt the body's automatic signals to store and hold urine longer, which will increase urgency/frequency.  In this case, the bladder "is running the show" and strategies can be learned to retrain this pattern.   One should be able to control the first urge to urinate, at around 146m.  The bladder can hold up to a "grande latte," or 4061m To help you gain control, practice the Urge Drill below when urgency strikes.  This drill will help retrain your bladder signals and allow you to store and hold urine longer.  The overall goal is to stretch out your time between voids to reach a more manageable voiding schedule.    Practice your "quick flicks" often throughout the day (each waking hour) even when you don't need feel the urge to go.  This will help strengthen your pelvic floor muscles, making them more effective in controlling leakage.  Urge Drill  When you feel an urge to go, follow these steps to regain control: Stop what you are doing and be still Take one deep breath, directing your air into your abdomen Think an affirming thought, such as "I've got this." Do 5 quick flicks of your pelvic floor Think about something else to take our mind off the bathroom.  Walk with control to the bathroom to void, or delay voiding Look into a book light.  Go every 3 hours or more.   Access Code: [email or web address] URL: [email or web address] Date: 01/29/2021 Prepared by: ChEarlie CountsExercises Supine Pelvic Floor  Contraction - 3 x daily - 7 x weekly - 1 sets - 10 reps - 5 sec hold Quick Flick Pelvic Floor Contractions in Hooklying - 3 x daily - 7 x weekly - 1 sets - 5 reps - sec hold Supine Piriformis Stretch Pulling Heel to Hip - 1 x daily - 7 x weekly - 1 sets - 2 reps - 15 sec hold Bent Knee Fallouts - 1 x daily - 7 x weekly - 1 sets - 10 reps Supine Lower Trunk Rotation - 1 x daily - 7 x weekly - 1 sets - 2 reps - 115 sec hold  ChEarlie CountsPT WoGeisinger Encompass [facility] Rehabilitation HospitaleElbert[address] Heather RoadSuElmsford[place] [id number]: [email or web address]

## 2021-02-04 ENCOUNTER — Encounter: Payer: Self-pay | Admitting: Family Medicine

## 2021-02-04 DIAGNOSIS — N184 Chronic kidney disease, stage 4 (severe): Secondary | ICD-10-CM

## 2021-02-04 DIAGNOSIS — E1169 Type 2 diabetes mellitus with other specified complication: Secondary | ICD-10-CM

## 2021-02-05 ENCOUNTER — Encounter: Payer: HMO | Admitting: Physical Therapy

## 2021-02-05 NOTE — Telephone Encounter (Signed)
Nurses I recommend metabolic 7 as well as lipid profile Chronic kidney disease Hyperlipidemia  We will discuss the timing of follow-up scan with the patient when she follows up on Monday

## 2021-02-06 DIAGNOSIS — E785 Hyperlipidemia, unspecified: Secondary | ICD-10-CM | POA: Diagnosis not present

## 2021-02-06 DIAGNOSIS — E119 Type 2 diabetes mellitus without complications: Secondary | ICD-10-CM | POA: Diagnosis not present

## 2021-02-06 DIAGNOSIS — E038 Other specified hypothyroidism: Secondary | ICD-10-CM | POA: Diagnosis not present

## 2021-02-06 DIAGNOSIS — N184 Chronic kidney disease, stage 4 (severe): Secondary | ICD-10-CM | POA: Diagnosis not present

## 2021-02-06 DIAGNOSIS — E1169 Type 2 diabetes mellitus with other specified complication: Secondary | ICD-10-CM | POA: Diagnosis not present

## 2021-02-07 LAB — LIPID PANEL
Chol/HDL Ratio: 3.4 ratio (ref 0.0–4.4)
Cholesterol, Total: 160 mg/dL (ref 100–199)
HDL: 47 mg/dL (ref >39)
LDL Chol Calc (NIH): 83 mg/dL (ref 0–99)
Triglycerides: 175 mg/dL — ABNORMAL HIGH (ref 0–149)
VLDL Cholesterol Cal: 30 mg/dL (ref 5–40)

## 2021-02-07 LAB — COMPREHENSIVE METABOLIC PANEL
ALT: 18 IU/L (ref 0–32)
AST: 20 IU/L (ref 0–40)
Albumin/Globulin Ratio: 1.4 (ref 1.2–2.2)
Albumin: 4.1 g/dL (ref 3.7–4.7)
Alkaline Phosphatase: 81 IU/L (ref 44–121)
BUN/Creatinine Ratio: 16 (ref 12–28)
BUN: 24 mg/dL (ref 8–27)
Bilirubin Total: 0.4 mg/dL (ref 0.0–1.2)
CO2: 22 mmol/L (ref 20–29)
Calcium: 9.8 mg/dL (ref 8.7–10.3)
Chloride: 106 mmol/L (ref 96–106)
Creatinine, Ser: 1.54 mg/dL — ABNORMAL HIGH (ref 0.57–1.00)
Globulin, Total: 3 g/dL (ref 1.5–4.5)
Glucose: 153 mg/dL — ABNORMAL HIGH (ref 70–99)
Potassium: 4.7 mmol/L (ref 3.5–5.2)
Sodium: 142 mmol/L (ref 134–144)
Total Protein: 7.1 g/dL (ref 6.0–8.5)
eGFR: 35 mL/min/{1.73_m2} — ABNORMAL LOW (ref >59)

## 2021-02-07 LAB — BASIC METABOLIC PANEL
BUN/Creatinine Ratio: 15 (ref 12–28)
BUN: 22 mg/dL (ref 8–27)
CO2: 22 mmol/L (ref 20–29)
Calcium: 9.8 mg/dL (ref 8.7–10.3)
Chloride: 106 mmol/L (ref 96–106)
Creatinine, Ser: 1.49 mg/dL — ABNORMAL HIGH (ref 0.57–1.00)
Glucose: 151 mg/dL — ABNORMAL HIGH (ref 70–99)
Potassium: 4.7 mmol/L (ref 3.5–5.2)
Sodium: 141 mmol/L (ref 134–144)
eGFR: 36 mL/min/{1.73_m2} — ABNORMAL LOW (ref >59)

## 2021-02-07 LAB — TSH: TSH: 15 u[IU]/mL — ABNORMAL HIGH (ref 0.450–4.500)

## 2021-02-07 LAB — T4, FREE: Free T4: 0.82 ng/dL (ref 0.82–1.77)

## 2021-02-10 ENCOUNTER — Ambulatory Visit (INDEPENDENT_AMBULATORY_CARE_PROVIDER_SITE_OTHER): Payer: HMO | Admitting: Family Medicine

## 2021-02-10 ENCOUNTER — Other Ambulatory Visit: Payer: Self-pay

## 2021-02-10 ENCOUNTER — Encounter: Payer: Self-pay | Admitting: Family Medicine

## 2021-02-10 VITALS — BP 134/82 | Temp 97.5°F | Wt 221.4 lb

## 2021-02-10 DIAGNOSIS — I129 Hypertensive chronic kidney disease with stage 1 through stage 4 chronic kidney disease, or unspecified chronic kidney disease: Secondary | ICD-10-CM

## 2021-02-10 DIAGNOSIS — M549 Dorsalgia, unspecified: Secondary | ICD-10-CM | POA: Diagnosis not present

## 2021-02-10 DIAGNOSIS — N183 Chronic kidney disease, stage 3 unspecified: Secondary | ICD-10-CM | POA: Diagnosis not present

## 2021-02-10 DIAGNOSIS — Z23 Encounter for immunization: Secondary | ICD-10-CM

## 2021-02-10 DIAGNOSIS — E1122 Type 2 diabetes mellitus with diabetic chronic kidney disease: Secondary | ICD-10-CM

## 2021-02-10 MED ORDER — EMPAGLIFLOZIN 10 MG PO TABS: 10.0000 mg | ORAL_TABLET | Freq: Every day | ORAL | 5 refills | Status: DC

## 2021-02-10 MED ORDER — TIZANIDINE HCL 2 MG PO TABS: ORAL_TABLET | ORAL | 5 refills | Status: DC

## 2021-02-10 NOTE — Patient Instructions (Signed)
Results for orders placed or performed in visit on 68/15/94  Basic Metabolic Panel (BMET)  Result Value Ref Range   Glucose 151 (H) 70 - 99 mg/dL   BUN 22 8 - 27 mg/dL   Creatinine, Ser 1.49 (H) 0.57 - 1.00 mg/dL   eGFR 36 (L) >59 mL/min/1.73   BUN/Creatinine Ratio 15 12 - 28   Sodium 141 134 - 144 mmol/L   Potassium 4.7 3.5 - 5.2 mmol/L   Chloride 106 96 - 106 mmol/L   CO2 22 20 - 29 mmol/L   Calcium 9.8 8.7 - 10.3 mg/dL  Lipid Profile  Result Value Ref Range   Cholesterol, Total 160 100 - 199 mg/dL   Triglycerides 175 (H) 0 - 149 mg/dL   HDL 47 >39 mg/dL   VLDL Cholesterol Cal 30 5 - 40 mg/dL   LDL Chol Calc (NIH) 83 0 - 99 mg/dL   Chol/HDL Ratio 3.4 0.0 - 4.4 ratio   Stop [place] and spironolactone Start jardiance Repeat 2 weeks do the met 7 OV 3 months Sooner if problems

## 2021-02-10 NOTE — Progress Notes (Signed)
   Subjective:    Patient ID: Kristen Mack, female    DOB: Aug 06, 1945, 75 y.o.   MRN: [id number]  HPI Pt here for follow up. Pt states she has not been taking blood pressure. Here lately pt has been having issues with changing positions and feeling "weird". Pt states she is having pain in legs and back. TSH is 15. Pt states she can not think clearly.   Back pain - ,uscle relaxer-she uses muscle relaxer at nighttime I have encouraged her to cut back on the dose given her age.  Head feels odd-at times she feels little on during the day but denies unilateral numbness or weakness  ?  Orthostatic she does state when she gets up at times she feels a little dizzy  Psuedocysts-pancreatic pseudocyst were present she is not due for repeat on MRI until spring time patient was informed of this  CKD DM jardiance-has diabetic tendencies plus also chronic kidney disease.  Nephrologist did mention Jardiance in his note.  I would recommend stopping Januvia start Jardiance low-dose rationale discussed.  Side effects discussed.  Review of Systems     Objective:   Physical Exam Bno General-in no acute distress Eyes-no discharge Lungs-respiratory rate normal, CTA CV-no murmurs,RRR Extremities skin warm dry no edema Neuro grossly normal Behavior normal, alert       Assessment & Plan:   Flu shot as directed  Chronic kidney disease progressive with diabetes stop Januvia start Jardiance low-dose warning signs discussed in detail.  Importance of healthy diet We will do follow-up lab in 2 weeks time  Morbid obesity portion control regular physical activity  Fibromyalgia May use muscle relaxer at nighttime but reduce the dose  Pancreatic pseudocyst do not do MRI until spring

## 2021-02-12 ENCOUNTER — Encounter: Payer: Self-pay | Admitting: Physical Therapy

## 2021-02-12 ENCOUNTER — Other Ambulatory Visit: Payer: Self-pay

## 2021-02-12 ENCOUNTER — Encounter: Payer: HMO | Admitting: Physical Therapy

## 2021-02-12 DIAGNOSIS — N393 Stress incontinence (female) (male): Secondary | ICD-10-CM

## 2021-02-12 DIAGNOSIS — M6281 Muscle weakness (generalized): Secondary | ICD-10-CM | POA: Diagnosis not present

## 2021-02-12 DIAGNOSIS — R159 Full incontinence of feces: Secondary | ICD-10-CM

## 2021-02-12 DIAGNOSIS — R278 Other lack of coordination: Secondary | ICD-10-CM

## 2021-02-12 NOTE — Therapy (Signed)
[facility] at [facility] [address] [place], [place], [address] Phone: [phone]   Fax:  [phone]  Physical Therapy Treatment  Patient Details  Name: Kristen Mack MRN: [id number] Date of Birth: 1945-03-30 Referring Provider (PT): Dr. Sherlene Shams   Encounter Date: 02/12/2021   PT End of Session - 02/12/21 1451     Visit Number 3    Date for PT Re-Evaluation 03/19/21    Authorization Type heatlhteam advantage    Authorization - Visit Number 3    Authorization - Number of Visits 10    PT Start Time 1400    PT Stop Time 3149    PT Time Calculation (min) 45 min    Activity Tolerance Patient tolerated treatment well    Behavior During Therapy [facility] for tasks assessed/performed             Past Medical History:  Diagnosis Date   Allergy 1964   Arthritis    Asthma    Cataract    CFS (chronic fatigue syndrome)    Chronic kidney disease 2021   Cracked tooth    X2  RIGHT LOWER MOLAR AND LEFT UPPER MOLAR   Depression    Diabetes mellitus without complication ([place])    Fatty liver disease, nonalcoholic    Fibromyalgia    GERD (gastroesophageal reflux disease)    History of adenomatous polyp of colon    History of cardiac murmur    until age 41   History of TIA (transient ischemic attack)    per CT scan    Hyperlipidemia    Hypertension    Hypothyroidism    IBS (irritable bowel syndrome)    Meniere disease    OSA (obstructive sleep apnea)    CPAP intolerate -- per study 2010  moderate osa--  STATES HAS RAISED HOB   Osteopenia    PMB (postmenopausal bleeding)    Prediabetes    Sleep apnea 07/11/2017   Abnormal home sleep apnea test February 2019-CPAP ordered   Stroke [facility])    Tooth loose    LOWER FRONT    Past Surgical History:  Procedure Laterality Date   [place]   and  REPAIR UMBILICAL HERNIA   COLONOSCOPY N/A 04/19/2013   Procedure:  COLONOSCOPY;  Surgeon: Rogene Houston, MD;  [facility];  Service: Endoscopy;  Laterality: N/A;     ESOPHAGOGASTRODUODENOSCOPY  02/08/2003   HERNIA REPAIR  1995   HYSTEROSCOPY WITH D & C  11/14/2002   endometrial polypectomy   HYSTEROSCOPY WITH D & C N/A 04/05/2014   Procedure: DILATATION AND CURETTAGE /HYSTEROSCOPY, ENDOMETRIAL POLYPECTOMY;  Surgeon: Sanjuana Kava, MD;  Location: [place];  Service: Gynecology;  Laterality: N/A;   LIVER BIOPSY  01/29/2004   benign   TUBAL LIGATION  1979    There were no vitals filed for this visit.   Subjective Assessment - 02/12/21 1406     Subjective I have regressed. I have stopped my medication. This week I am not able to hold the muscles as much as I used to. My TSH is off and I will meet my endocrinologist to go over my medication. I do better if I void ever y2 hours.    Patient is accompained by: Family member   daughter   Patient Stated Goals activate core muscles; stand up and not flood her diaper;    Currently in Pain?  No/denies    Multiple Pain Sites No                               [facility] Adult PT Treatment/Exercise - 02/12/21 0001       Self-Care   Self-Care Other Self-Care Comments    Other Self-Care Comments  discussed with keeping the voiding every 2 hours and her thyroid being off could affect the strenght of the pelvic floor; discussed ways to clean the rectum after a soft bowel movement 3 BM; educated patient on platform step for bed and bed handrail.      Lumbar Exercises: Seated   Sit to Stand 10 reps    Sit to Stand Limitations with pelvic floor contraction    Other Seated Lumbar Exercises sit and lean forward while she presses on the stool and contract pelvic floor to facilitate her getting up to go to the bathroom                     PT Education - 02/12/21 1448     Education Details education on ways to adapt her enviornment to get up to go to the bathroom  easier; sit to stand with pelvic floor contraction; press on table in front of her with pelvic floor contraction; voiding every 2 hours    Person(s) Educated Patient;Child(ren)   daughter   Methods Explanation;Demonstration    Comprehension Verbalized understanding;Returned demonstration              PT Short Term Goals - 01/29/21 1356       PT SHORT TERM GOAL #1   Title Patient will report understanding and regular compliance of HEP to improve patient's pelvic floor strength and coordination    Time 4    Period Weeks    Status Achieved    Target Date 01/22/21      PT SHORT TERM GOAL #2   Title understand bladder irritatns to reduce urinary leakage    Time 4    Period Weeks    Status Achieved    Target Date 01/22/21               PT Long Term Goals - 02/12/21 1455       PT LONG TERM GOAL #1   Title Patient will demonstrate improvement of at least 1 MMT strength grade in hip muscles to help improve pelvic floor strength and ability to walk to the commode in time [place] to the bathroom    Time 12    Period Weeks    Status On-going      PT LONG TERM GOAL #2   Title able to wear 1 depned during the day and at night due to reduction of urinary leakage >/= 50%    Time 12    Period Weeks    Status On-going      PT LONG TERM GOAL #3   Title able to go from sit to stand with urinary leakage decreased by 50% due to increased pelvic floor coordination and strength    Time 12    Period Weeks    Status On-going      PT LONG TERM GOAL #4   Title reduced her tea intake by 50% so she is able to see a change in her urinary leakage and increased her water intake to 4 bottlse of water    Time 12    Period Weeks  Status On-going      PT LONG TERM GOAL #5   Title fecal leakage decreased >/= 50% due to increased pelvic floor strength    Time 12    Period Weeks    Status Achieved                   Plan - 02/12/21 1451     Clinical Impression Statement Patient  had had increased urinary leakage last week and not sure if due ot her stopping her diuretic or the thyroid is off. Patient has had no fecal laekage since last visit. She will have a bowel movement 2-3 times per day. Patient is urinating 1-3 times per night instead of every hour. Patient wets her pants 2 times per day. Patient will leak at night when she is to get out of bed. Patient has learned ways to adjust her bed to make it easier to get out. Patient is talking to her endocrinologist next week about her thyroid. Patient will benefit from skilled therapy to improve pelvic floor strength and coordination to reduce urinary and fecal leakage.    Personal Factors and Comorbidities Comorbidity 3+;Past/Current Experience;Fitness;Age    Comorbidities Osteoporosis; Fibromyalgia; hypothyroidism, TIA; c-section x2    Examination-Activity Limitations Sit;Stand;Toileting;Continence;Transfers;Lift;Locomotion Level    Examination-Participation Restrictions Community Activity    Stability/Clinical Decision Making Evolving/Moderate complexity    Rehab Potential Good    PT Frequency 1x / week    PT Duration 12 weeks    PT Treatment/Interventions ADLs/Self Care Home Management;Biofeedback;Therapeutic activities;Therapeutic exercise;Neuromuscular re-education;Patient/family education;Manual techniques    PT Next Visit Plan see how the voiding every 2 hours go; add hip adductor in sidely,  sitting pelvic floor contraction;; see how adjusting her bed went and Thyroid    PT Home Exercise Plan Access Code: [email or web address]    Consulted and Agree with Plan of Care Patient             Patient will benefit from skilled therapeutic intervention in order to improve the following deficits and impairments:  Decreased endurance, Decreased knowledge of precautions, Decreased activity tolerance, Decreased strength, Increased fascial restricitons, Difficulty walking, Decreased coordination  Visit Diagnosis: Muscle weakness  (generalized)  Other lack of coordination  Stress incontinence (female) (female)  Incontinence of feces, unspecified fecal incontinence type     Problem List Patient Active Problem List   Diagnosis Date Noted   Bruising tendency 06/30/2020   Mixed hyperlipidemia 02/13/2020   Diabetes mellitus without complication ([place]) 71/24/5809   Sleep apnea 07/11/2017   Pancreatic pseudocyst 05/10/2017   Adrenal adenoma 05/10/2017   Osteoporosis 11/21/2016   Fibromyalgia 08/10/2016   Female stress incontinence 11/05/2015   NASH (nonalcoholic steatohepatitis) 04/24/2015   Orthostatic hypotension 09/19/2013   Elevated transaminase level 09/19/2013   HTN (hypertension), benign 09/03/2013   Depression 08/14/2013   Fatty liver 08/14/2013   Other specified hypothyroidism 10/23/2012   Osteopenia 07/08/2012   Morbid obesity ([place]) 07/08/2012   Urinary bladder incontinence 04/30/2012   Family history of rectal cancer 10/04/2011   GERD (gastroesophageal reflux disease) 10/04/2011   IBS (irritable bowel syndrome) 10/04/2011   Multiple chemical sensitivity syndrome 11/27/1968    Earlie Counts, PT 02/12/21 2:56 PM  [place] [facility] [place] [facility] [address] [place], [place], [address] Phone: [phone]   Fax:  [phone]  Name: Kristen Mack MRN: [id number] Date of Birth: 1945/12/08

## 2021-02-16 ENCOUNTER — Ambulatory Visit: Payer: HMO | Admitting: "Endocrinology

## 2021-02-23 ENCOUNTER — Other Ambulatory Visit: Payer: Self-pay | Admitting: "Endocrinology

## 2021-02-23 ENCOUNTER — Encounter: Payer: Self-pay | Admitting: "Endocrinology

## 2021-02-23 ENCOUNTER — Encounter: Payer: Self-pay | Admitting: Family Medicine

## 2021-02-23 DIAGNOSIS — I129 Hypertensive chronic kidney disease with stage 1 through stage 4 chronic kidney disease, or unspecified chronic kidney disease: Secondary | ICD-10-CM

## 2021-02-23 DIAGNOSIS — N183 Chronic kidney disease, stage 3 unspecified: Secondary | ICD-10-CM

## 2021-02-23 MED ORDER — LEVOTHYROXINE SODIUM 125 MCG PO TABS: 125.0000 ug | ORAL_TABLET | Freq: Every day | ORAL | 2 refills | Status: DC

## 2021-02-24 ENCOUNTER — Other Ambulatory Visit: Payer: Self-pay | Admitting: "Endocrinology

## 2021-02-24 DIAGNOSIS — E038 Other specified hypothyroidism: Secondary | ICD-10-CM

## 2021-02-24 NOTE — Addendum Note (Signed)
Addended by: Dairl Ponder on: 02/24/2021 04:27 PM   Modules accepted: Orders

## 2021-02-24 NOTE — Telephone Encounter (Signed)
Please let Kristen Mack know-if this is what needs to happen because of cost constraints I certainly understand  Nurses-please forward information to [name]-clinical pharmacist-if there are programs that could help her afford the [place] this would be the best approach.  And if she needs referral under chronic care management I am certainly supportive of that.  Her insurance should cover the cost of chronic care management

## 2021-02-25 ENCOUNTER — Telehealth: Payer: Self-pay | Admitting: *Deleted

## 2021-02-25 NOTE — Chronic Care Management (AMB) (Signed)
  Chronic Care Management   Outreach Note  02/25/2021 Name: [name] MRN: [id number] DOB: December 15, 1945  Kristen Mack is a 75 y.o. year old female who is a primary care patient of Luking, Elayne Snare, MD. I reached out to [name] by phone today in response to a referral sent by Ms. Dianah Field primary care provider.  An unsuccessful telephone outreach was attempted today. The patient was referred to the case management team for assistance with care management and care coordination.   Follow Up Plan: A HIPAA compliant phone message was left for the patient providing contact information and requesting a return call. The care management team will reach out to the patient again over the next 7 days. If patient returns call to provider office, please advise to call [place] at [phone].  [facility]  Direct Dial: [phone]

## 2021-03-02 ENCOUNTER — Other Ambulatory Visit: Payer: Self-pay | Admitting: Pharmacist

## 2021-03-02 DIAGNOSIS — M810 Age-related osteoporosis without current pathological fracture: Secondary | ICD-10-CM

## 2021-03-02 NOTE — Progress Notes (Deleted)
[facility] Return Visit  SUBJECTIVE  History of Present Illness: Kristen Mack is a 75 y.o. female seen in follow-up for mixed incontinence and bowel leakage. Plan at last visit was to start physical therapy, reduce irritants, and start fiber supplementation.     Past Medical History: Patient  has a past medical history of Allergy (1964), Arthritis, Asthma, Cataract, CFS (chronic fatigue syndrome), Chronic kidney disease (2021), Cracked tooth, Depression, Diabetes mellitus without complication ([place]), Fatty liver disease, nonalcoholic, Fibromyalgia, GERD (gastroesophageal reflux disease), History of adenomatous polyp of colon, History of cardiac murmur, History of TIA (transient ischemic attack), Hyperlipidemia, Hypertension, Hypothyroidism, IBS (irritable bowel syndrome), Meniere disease, OSA (obstructive sleep apnea), Osteopenia, PMB (postmenopausal bleeding), Prediabetes, Sleep apnea (07/11/2017), Stroke ([place]), and Tooth loose.   Past Surgical History: She  has a past surgical history that includes Cesarean section (1977  &  1979); Esophagogastroduodenoscopy (02/08/2003); Colonoscopy (N/A, 04/19/2013); Hysteroscopy with D & C (11/14/2002); Liver biopsy (01/29/2004); Cholecystectomy (1995); Hysteroscopy with D & C (N/A, 04/05/2014); Tubal ligation (1979); and Hernia repair (1995).   Medications: She has a current medication list which includes the following prescription(s): albuterol, azelastine hcl, b complex vitamins, bupropion, chromium, coq10, b-12, digestive enzymes, diltiazem, empagliflozin, onetouch verio, ipratropium, levothyroxine, lysine hcl, magnesium, meclizine, montelukast, nystatin ointment, OVER THE COUNTER MEDICATION, OVER THE COUNTER MEDICATION, OVER THE COUNTER MEDICATION, rosuvastatin, tizanidine, vitamin a, vitamin c, and vitamin e.   Allergies: Patient is allergic to [employer] tartrate], cefzil [cefprozil], codeine, demerol [meperidine],  formaldehyde, latex, sulfa antibiotics, and celexa [citalopram hydrobromide].   Social History: Patient  reports that she quit smoking about 52 years ago. Her smoking use included cigarettes. She has a 4.50 pack-year smoking history. She has never used smokeless tobacco. She reports that she does not drink alcohol and does not use drugs.      OBJECTIVE     Physical Exam: There were no vitals filed for this visit. Gen: No apparent distress, A&O x 3.  Detailed Urogynecologic Evaluation:  Deferred. Prior exam showed:  No flowsheet data found.     ASSESSMENT AND PLAN    [name] is a 75 y.o. with:  No diagnosis found.

## 2021-03-02 NOTE — Chronic Care Management (AMB) (Signed)
  Chronic Care Management   Note  03/02/2021 Name: Kristen Mack MRN: [id number] DOB: [birth year]  Kristen Mack is a 75 y.o. year old female who is a primary care patient of Luking, Elayne Snare, MD. I reached out to [name] by phone today in response to a referral sent by Ms. Dianah Field PCP.  Ms. Litzinger was given information about Chronic Care Management services today including:  CCM service includes personalized support from designated clinical staff supervised by her physician, including individualized plan of care and coordination with other care providers 24/7 contact phone numbers for assistance for urgent and routine care needs. Service will only be billed when office clinical staff spend 20 minutes or more in a month to coordinate care. Only one practitioner may furnish and bill the service in a calendar month. The patient may stop CCM services at any time (effective at the end of the month) by phone call to the office staff. The patient is responsible for co-pay (up to 20% after annual deductible is met) if co-pay is required by the individual health plan.   Patient agreed to services and verbal consent obtained.   Follow up plan: Telephone appointment with care management team member scheduled for:04/03/21  [place]: [phone]

## 2021-03-03 ENCOUNTER — Ambulatory Visit: Payer: HMO | Admitting: Obstetrics and Gynecology

## 2021-03-04 ENCOUNTER — Encounter: Payer: HMO | Admitting: Physical Therapy

## 2021-03-17 ENCOUNTER — Telehealth: Payer: Self-pay | Admitting: *Deleted

## 2021-03-17 NOTE — Telephone Encounter (Signed)
° °  Telephone encounter was:  Successful.  03/17/2021 Name: Kristen Mack MRN: [id number] DOB: 1945/07/06  Kristen Mack is a 75 y.o. year old female who is a primary care patient of Luking, Elayne Snare, MD . The community resource team was consulted for assistance with Home ModificationsSo i send you the information ramp resources , along with [place] services for the blind .  Care guide performed the following interventions: Patient provided with information about care guide support team and interviewed to confirm resource needs Follow up call placed to community resources to determine status of patients referral.  Follow Up Plan:  No further follow up planned at this time. The patient has been provided with needed resources. [place], Care Management  [phone] [address] , [place] [address] Email : [email or web address]

## 2021-03-19 ENCOUNTER — Other Ambulatory Visit: Payer: Self-pay | Admitting: Obstetrics & Gynecology

## 2021-03-19 ENCOUNTER — Telehealth: Payer: Self-pay | Admitting: Cardiovascular Disease

## 2021-03-19 DIAGNOSIS — N184 Chronic kidney disease, stage 4 (severe): Secondary | ICD-10-CM | POA: Diagnosis not present

## 2021-03-19 DIAGNOSIS — E1122 Type 2 diabetes mellitus with diabetic chronic kidney disease: Secondary | ICD-10-CM | POA: Diagnosis not present

## 2021-03-19 DIAGNOSIS — I129 Hypertensive chronic kidney disease with stage 1 through stage 4 chronic kidney disease, or unspecified chronic kidney disease: Secondary | ICD-10-CM | POA: Diagnosis not present

## 2021-03-19 DIAGNOSIS — N183 Chronic kidney disease, stage 3 unspecified: Secondary | ICD-10-CM | POA: Diagnosis not present

## 2021-03-19 NOTE — Telephone Encounter (Signed)
° °  Pre-operative Risk Assessment    Patient Name: Kristen Mack  DOB: 06-12-1945 MRN: [id number]      Request for Surgical Clearance    Procedure:   hysteroscopy with biopsy of endometrium and polypectomy  Date of Surgery:  Clearance 04/24/21                                 Surgeon:  Dr. Sanjuana Kava Surgeon's Group or Practice Name:  [facility] Phone number: [phone] Fax number:  [phone]   Type of Clearance Requested:   - Medical    Type of Anesthesia:   choice   Additional requests/questions:   n/a  Marquita Palms   03/19/2021, 1:49 PM

## 2021-03-19 NOTE — Telephone Encounter (Signed)
Primary Cardiologist:[place] Doreatha Lew, MD  Chart reviewed as part of pre-operative protocol coverage. Because of Kristen Mack past medical history and time since last visit, he/she will require a follow-up visit in order to better assess preoperative cardiovascular risk.  Pre-op covering staff: - Please schedule appointment and call patient to inform them. - Please contact requesting surgeon's office via preferred method (i.e, phone, fax) to inform them of need for appointment prior to surgery.  If applicable, this message will also be routed to pharmacy pool and/or primary cardiologist for input on holding anticoagulant/antiplatelet agent as requested below so that this information is available at time of patient's appointment.   Deberah Pelton, NP  03/19/2021, 1:58 PM

## 2021-03-20 LAB — BASIC METABOLIC PANEL
BUN/Creatinine Ratio: 18 (ref 12–28)
BUN: 27 mg/dL (ref 8–27)
CO2: 19 mmol/L — ABNORMAL LOW (ref 20–29)
Calcium: 9.7 mg/dL (ref 8.7–10.3)
Chloride: 104 mmol/L (ref 96–106)
Creatinine, Ser: 1.51 mg/dL — ABNORMAL HIGH (ref 0.57–1.00)
Glucose: 168 mg/dL — ABNORMAL HIGH (ref 70–99)
Potassium: 3.9 mmol/L (ref 3.5–5.2)
Sodium: 141 mmol/L (ref 134–144)
eGFR: 36 mL/min/{1.73_m2} — ABNORMAL LOW (ref >59)

## 2021-03-24 NOTE — Telephone Encounter (Signed)
Attempted to reach patient to schedule sooner appointment for preop clearance. No answer. Lvm  *Pt has [place] Address, so I was going to see if she was interested in being seen by one of the [facility] there.

## 2021-03-25 ENCOUNTER — Ambulatory Visit: Payer: HMO | Admitting: Physical Therapy

## 2021-03-25 NOTE — Telephone Encounter (Signed)
Attempted to reach patient to schedule sooner appointment for preop clearance. No answer. Lvm

## 2021-03-26 ENCOUNTER — Encounter: Payer: HMO | Admitting: Physical Therapy

## 2021-03-26 NOTE — Telephone Encounter (Signed)
Left message for the pt to call the office for a sooner appt. We have tried to reach out to pt x 3. I will send a letter today as well asking the pt to call for sooner appt for pre op clearance. I will also update the requesting office that we have been unable to reach pt for a sooner appt.

## 2021-03-26 NOTE — Telephone Encounter (Signed)
Pt has appt 04/06/21 with Dr. Audie Box for pre op clearance. I will forward notes to MD for upcoming appt. Will send FYI to requesting office pt has appt 04/06/21.

## 2021-03-31 ENCOUNTER — Other Ambulatory Visit: Payer: Self-pay | Admitting: Cardiovascular Disease

## 2021-04-02 ENCOUNTER — Encounter: Payer: Self-pay | Admitting: Physical Therapy

## 2021-04-02 ENCOUNTER — Encounter: Payer: HMO | Attending: Obstetrics and Gynecology | Admitting: Physical Therapy

## 2021-04-02 ENCOUNTER — Other Ambulatory Visit: Payer: Self-pay

## 2021-04-02 DIAGNOSIS — R159 Full incontinence of feces: Secondary | ICD-10-CM | POA: Insufficient documentation

## 2021-04-02 DIAGNOSIS — M6281 Muscle weakness (generalized): Secondary | ICD-10-CM | POA: Insufficient documentation

## 2021-04-02 DIAGNOSIS — N393 Stress incontinence (female) (male): Secondary | ICD-10-CM | POA: Insufficient documentation

## 2021-04-02 DIAGNOSIS — R278 Other lack of coordination: Secondary | ICD-10-CM | POA: Insufficient documentation

## 2021-04-02 NOTE — Therapy (Signed)
[facility] [address], [place], [place], [address] Phone: [phone]   Fax:  [phone]  Physical Therapy Treatment  Patient Details  Name: Kristen Mack MRN: [id number] Date of Birth: [birth year] Referring Provider (PT): Dr. Sherlene Shams   Encounter Date: 04/02/2021   PT End of Session - 04/02/21 1459     Visit Number 4    Date for PT Re-Evaluation 04/02/21    Authorization Type heatlhteam advantage    Authorization - Visit Number 4    Authorization - Number of Visits 10    PT Start Time 8309   came late   PT Stop Time 1455    PT Time Calculation (min) 40 min    Activity Tolerance Patient tolerated treatment well    Behavior During Therapy [facility] for tasks assessed/performed             Past Medical History:  Diagnosis Date   Allergy 1964   Arthritis    Asthma    Cataract    CFS (chronic fatigue syndrome)    Chronic kidney disease 2021   Cracked tooth    X2  RIGHT LOWER MOLAR AND LEFT UPPER MOLAR   Depression    Diabetes mellitus without complication ([place])    Fatty liver disease, nonalcoholic    Fibromyalgia    GERD (gastroesophageal reflux disease)    History of adenomatous polyp of colon    History of cardiac murmur    until age 49   History of TIA (transient ischemic attack)    per CT scan    Hyperlipidemia    Hypertension    Hypothyroidism    IBS (irritable bowel syndrome)    Meniere disease    OSA (obstructive sleep apnea)    CPAP intolerate -- per study 2010  moderate osa--  STATES HAS RAISED HOB   Osteopenia    PMB (postmenopausal bleeding)    Prediabetes    Sleep apnea 07/11/2017   Abnormal home sleep apnea test February 2019-CPAP ordered   Stroke [facility])    Tooth loose    LOWER FRONT    Past Surgical History:  Procedure Laterality Date   [place]   and  REPAIR UMBILICAL HERNIA   COLONOSCOPY N/A 04/19/2013    Procedure: COLONOSCOPY;  Surgeon: Rogene Houston, MD;  Location: [facility];  Service: Endoscopy;  Laterality: N/A;     ESOPHAGOGASTRODUODENOSCOPY  02/08/2003   HERNIA REPAIR  1995   HYSTEROSCOPY WITH D & C  11/14/2002   endometrial polypectomy   HYSTEROSCOPY WITH D & C N/A 04/05/2014   Procedure: DILATATION AND CURETTAGE /HYSTEROSCOPY, ENDOMETRIAL POLYPECTOMY;  Surgeon: Sanjuana Kava, MD;  Location: [facility];  Service: Gynecology;  Laterality: N/A;   LIVER BIOPSY  01/29/2004   benign   TUBAL LIGATION  1979    There were no vitals filed for this visit.   Subjective Assessment - 04/02/21 1420     Subjective I am peeing more due to change in medication and makes her urinate more. Sometimes I do not nkow I am wetting my briefs. Urinary leakage is worse at night. I am having a DNC  due to uterine lining has increased in thickness. Thyroid medication has been increased. I am drinking alot more water, tea, gatorade and coke. Patient is trying to urinate every 2 hours. I am still doing my exercises.    Patient is  accompained by: Family member   daughter   Patient Stated Goals activate core muscles; stand up and not flood her diaper;    Currently in Pain? No/denies                [facility] PT Assessment - 04/02/21 0001       Assessment   Medical Diagnosis N32.81 Overactive Bladder; N39.3 SUI; R15.9 Incontinece of feces    Referring Provider (PT) Dr. Sherlene Shams    Onset Date/Surgical Date --   chronic   Prior Therapy None      Precautions   Precautions Other (comment)    Precaution Comments osteoporosis      Restrictions   Weight Bearing Restrictions No      Home Environment   Living Environment Private residence    Living Arrangements Spouse/significant other;Children      Prior Function   Level of [place] Retired      Associate Professor   Overall Cognitive Status Within Functional Limits for tasks assessed       Posture/Postural Control   Posture/Postural Control Postural limitations    Postural Limitations Rounded Shoulders;Forward head      Strength   Overall Strength Comments hip abduction and extension 3/5      Palpation   Palpation comment Difficulty with expanding the lower rib cage with breath                        Pelvic Floor Special Questions - 04/02/21 0001     Number of Pregnancies 2    Number of C-Sections 2    Urinary Leakage Yes    Pad use wears a depends 3-4 per day depending on how much she drinks; 2 depends    Activities that cause leaking Other;With strong urge    Other activities that cause leaking going from sitting to standing    Urinary urgency Yes    Urinary frequency urinates every couple of hours    Fecal incontinence Yes   leak stool 1 time per month; has IBS   Fluid intake 16.9 ounces of water per day    Caffeine beverages 2 cokes or coffee per day; tea is her main drink   caffiene helps her ADHD   Falling out feeling (prolapse) No    External Perineal Exam some cysts on the labia    Skin Integrity Intact    Perineal Body/Introitus  Elevated    Exam Type Deferred    Strength weak squeeze, no lift   2/5 anterior and posterior; 1/5 on the sides              [facility] Adult PT Treatment/Exercise - 04/02/21 0001       Self-Care   Self-Care Other Self-Care Comments    Other Self-Care Comments  discusse with patient on reducing her caffiene intake including her tea and cokes due ot being bladder irritants. Discussed with patient on her timed voiding and reducing it to 1.5 hours due to her alread leaking prior to thte 2 hours. discussed with patient ot not drink 2 hours prior to going ot bed to reduce the urinary leakge at night time. Discussed withpatient on her stopping therapy at this time to work on regulating her medications and having her [facility].Marland Kitchen                     PT Education - 04/02/21 1458     Education Details education  on  reducing her tea and coke; education on voiding every 1/5 hours. education on not drinking 2 hours prior to going to bed    Person(s) Educated Patient;Child(ren)   daughter   Methods Explanation    Comprehension Verbalized understanding              PT Short Term Goals - 01/29/21 1356       PT SHORT TERM GOAL #1   Title Patient will report understanding and regular compliance of HEP to improve patient's pelvic floor strength and coordination    Time 4    Period Weeks    Status Achieved    Target Date 01/22/21      PT SHORT TERM GOAL #2   Title understand bladder irritatns to reduce urinary leakage    Time 4    Period Weeks    Status Achieved    Target Date 01/22/21               PT Long Term Goals - 04/02/21 1507       PT LONG TERM GOAL #1   Title Patient will demonstrate improvement of at least 1 MMT strength grade in hip muscles to help improve pelvic floor strength and ability to walk to the commode in time [place] to the bathroom    Time 12    Period Weeks    Status Not Met      PT LONG TERM GOAL #2   Title able to wear 1 depned during the day and at night due to reduction of urinary leakage >/= 50%    Time 12    Period Weeks    Status Not Met      PT LONG TERM GOAL #3   Title able to go from sit to stand with urinary leakage decreased by 50% due to increased pelvic floor coordination and strength    Time 12    Period Weeks    Status Not Met      PT LONG TERM GOAL #4   Title reduced her tea intake by 50% so she is able to see a change in her urinary leakage and increased her water intake to 4 bottlse of water    Time 12    Period Weeks    Status Not Met      PT LONG TERM GOAL #5   Title fecal leakage decreased >/= 50% due to increased pelvic floor strength    Time 12    Period Weeks    Status Achieved                   Plan - 04/02/21 1500     Clinical Impression Statement Patient has come to therapy with increased urinary leakage. She  has changed her medication that is having her urinate more due to taking fluid out of her body for the fluid retention. She is drinking more coke and tea lately to assist her with her ADHD. Patient does not always feel her urine leaking. She had to increase her thyorid medication recently. Patient will be having a DNC soon too. Patient and therapist agreed she should stop therapy at this time to have her medicaiton adjusted correctly so she does not have as much fluid being expelled from her body and when she has decreased her coke and tea intake. At this time she has not met goals so she will be discharged.    Personal Factors and Comorbidities Comorbidity 3+;Past/Current Experience;Fitness;Age  Comorbidities Osteoporosis; Fibromyalgia; hypothyroidism, TIA; c-section x2    Examination-Activity Limitations Sit;Stand;Toileting;Continence;Transfers;Lift;Locomotion Level    Examination-Participation Restrictions Community Activity    Stability/Clinical Decision Making Evolving/Moderate complexity    Rehab Potential Good    PT Frequency One time visit    PT Duration 2 weeks    PT Treatment/Interventions ADLs/Self Care Home Management;Biofeedback;Therapeutic activities;Therapeutic exercise;Neuromuscular re-education;Patient/family education;Manual techniques    PT Next Visit Plan 1 time visit to be reassessed for discharge. She will be discharged to HEP.    PT Home Exercise Plan Access Code: [email or web address]    Consulted and Agree with Plan of Care Patient;Family member/caregiver   daughter            Patient will benefit from skilled therapeutic intervention in order to improve the following deficits and impairments:  Decreased endurance, Decreased knowledge of precautions, Decreased activity tolerance, Decreased strength, Increased fascial restricitons, Difficulty walking, Decreased coordination  Visit Diagnosis: Muscle weakness (generalized) - Plan: PT plan of care cert/re-cert  Other lack of  coordination - Plan: PT plan of care cert/re-cert  Stress incontinence (female) (female) - Plan: PT plan of care cert/re-cert  Incontinence of feces, unspecified fecal incontinence type - Plan: PT plan of care cert/re-cert     Problem List Patient Active Problem List   Diagnosis Date Noted   Bruising tendency 06/30/2020   Mixed hyperlipidemia 02/13/2020   Diabetes mellitus without complication ([place]) 44/03/270   Sleep apnea 07/11/2017   Pancreatic pseudocyst 05/10/2017   Adrenal adenoma 05/10/2017   Osteoporosis 11/21/2016   Fibromyalgia 08/10/2016   Female stress incontinence 11/05/2015   NASH (nonalcoholic steatohepatitis) 04/24/2015   Orthostatic hypotension 09/19/2013   Elevated transaminase level 09/19/2013   HTN (hypertension), benign 09/03/2013   Depression 08/14/2013   Fatty liver 08/14/2013   Other specified hypothyroidism 10/23/2012   Osteopenia 07/08/2012   Morbid obesity ([place]) 07/08/2012   Urinary bladder incontinence 04/30/2012   Family history of rectal cancer 10/04/2011   GERD (gastroesophageal reflux disease) 10/04/2011   IBS (irritable bowel syndrome) 10/04/2011   Multiple chemical sensitivity syndrome 11/27/1968    Earlie Counts, PT 04/02/21 3:10 PM  [facility] [address] [place], [place], [address] Phone: [phone]   Fax:  [phone]  Name: Kristen Mack MRN: [id number] Date of Birth: [birth year]   PHYSICAL THERAPY DISCHARGE SUMMARY  Visits from Start of Care: 4  Current functional level related to goals / functional outcomes: See above.   Remaining deficits: See above.    Education / Equipment: HEP   Patient agrees to discharge. Patient goals were not met. Patient is being discharged due to  change in medication increasing her symptoms and should return when she has her medications stabilized. Earlie Counts, PT 04/02/21 3:11 PM

## 2021-04-03 ENCOUNTER — Ambulatory Visit (INDEPENDENT_AMBULATORY_CARE_PROVIDER_SITE_OTHER): Payer: HMO | Admitting: Pharmacist

## 2021-04-03 DIAGNOSIS — E1122 Type 2 diabetes mellitus with diabetic chronic kidney disease: Secondary | ICD-10-CM

## 2021-04-03 DIAGNOSIS — E785 Hyperlipidemia, unspecified: Secondary | ICD-10-CM

## 2021-04-03 DIAGNOSIS — E1169 Type 2 diabetes mellitus with other specified complication: Secondary | ICD-10-CM

## 2021-04-03 DIAGNOSIS — I10 Essential (primary) hypertension: Secondary | ICD-10-CM

## 2021-04-03 DIAGNOSIS — N184 Chronic kidney disease, stage 4 (severe): Secondary | ICD-10-CM

## 2021-04-03 DIAGNOSIS — M810 Age-related osteoporosis without current pathological fracture: Secondary | ICD-10-CM

## 2021-04-03 NOTE — Progress Notes (Signed)
Cardiology Office Note:   Date:  04/06/2021  NAME:  Kristen Mack    MRN: [id number] DOB:  01/23/1946   PCP:  Kathyrn Drown, MD  Cardiologist:  Evalina Field, MD  Electrophysiologist:  None   Referring MD: Sanjuana Kava, MD   Chief Complaint  Patient presents with   Follow-up         History of Present Illness:   Kristen Mack is a 76 y.o. female with a hx of PVCs, diabetes, CKD, OSA, CVA who presents for follow-up.  She is doing much better.  She reports he is hardly noticing any palpitations.  She may notice heart racing once per week.  Symptoms can last up to 5 minutes and resolved.  EKG today demonstrates sinus rhythm with PVCs.  Really has no PVCs on auscultation.  She does get short of breath with activity.  She is not exerting herself much.  I suspect this is due to deconditioning.  No signs of congestive heart failure.  Echocardiogram recently was normal.  Her blood pressure is well controlled at 140/82.  She is still morbidly obese with a BMI of 44. Denies CP. TSH is now elevated.  Still working with primary care physician to get this under control.  She reports she will have a D&C performed by her gynecologist.  She is having some bleeding.  No major symptoms reported.  I informed her that it is okay for her to proceed with this procedure.  Problem List 1. Diabetes -A1c 7.0 -T chol 160, HDL 47, LDL 83, TG 175 2. Hypothyroidism  3. CKD 3 4. CVA 5. OSA 6. PVCs  -22% burden -in setting of iatrogenic hyperthyroidism  -EF 55-60%  Past Medical History: Past Medical History:  Diagnosis Date   Allergy 1964   Arthritis    Asthma    Cataract    CFS (chronic fatigue syndrome)    Chronic kidney disease 2021   Cracked tooth    X2  RIGHT LOWER MOLAR AND LEFT UPPER MOLAR   Depression    Diabetes mellitus without complication (HCC)    Fatty liver disease, nonalcoholic    Fibromyalgia    GERD (gastroesophageal reflux disease)    History of adenomatous polyp  of colon    History of cardiac murmur    until age 58   History of TIA (transient ischemic attack)    per CT scan    Hyperlipidemia    Hypertension    Hypothyroidism    IBS (irritable bowel syndrome)    Meniere disease    OSA (obstructive sleep apnea)    CPAP intolerate -- per study 2010  moderate osa--  STATES HAS RAISED HOB   Osteopenia    PMB (postmenopausal bleeding)    Prediabetes    Sleep apnea 07/11/2017   Abnormal home sleep apnea test February 2019-CPAP ordered   Stroke [facility])    Tooth loose    LOWER FRONT    Past Surgical History: Past Surgical History:  Procedure Laterality Date   [place]   and  REPAIR UMBILICAL HERNIA   COLONOSCOPY N/A 04/19/2013   Procedure: COLONOSCOPY;  Surgeon: Rogene Houston, MD;  [facility];  Service: Endoscopy;  Laterality: N/A;     ESOPHAGOGASTRODUODENOSCOPY  02/08/2003   HERNIA REPAIR  1995   HYSTEROSCOPY WITH D & C  11/14/2002   endometrial polypectomy   HYSTEROSCOPY WITH D & C  N/A 04/05/2014   Procedure: DILATATION AND CURETTAGE /HYSTEROSCOPY, ENDOMETRIAL POLYPECTOMY;  Surgeon: Sanjuana Kava, MD;  Location: [place];  Service: Gynecology;  Laterality: N/A;   LIVER BIOPSY  01/29/2004   benign   TUBAL LIGATION  1979    Current Medications: Current Meds  Medication Sig   albuterol (VENTOLIN HFA) 108 (90 Base) MCG/ACT inhaler INHALE 2 PUFFS INTO LUNGS EVERY 6 HOURS AS NEEDED FOR SHORTNESS OF BREATH   Azelastine HCl 137 MCG/SPRAY SOLN USE 2 SPRAY(S) IN EACH NOSTRIL TWICE DAILY   B Complex Vitamins (B COMPLEX 100 PO) Take 1 tablet by mouth daily.   buPROPion (WELLBUTRIN XL) 300 MG 24 hr tablet Take 1 tablet (300 mg total) by mouth daily.   Chromium 1000 MCG TABS Take 1 tablet by mouth daily.    Coenzyme Q10 (COQ10) 200 MG CAPS Take 2 capsules by mouth daily.   Cyanocobalamin (B-12) 1000 MCG SUBL Place 1 tablet under the tongue daily.    DIGESTIVE ENZYMES PO  Take 1 tablet by mouth 3 (three) times daily.    diltiazem (CARDIZEM CD) 120 MG 24 hr capsule Take 1 capsule by mouth once daily   empagliflozin (JARDIANCE) 10 MG TABS tablet Take 1 tablet (10 mg total) by mouth daily before breakfast.   glucose blood (ONETOUCH VERIO) test strip Test once daily   ipratropium (ATROVENT) 0.02 % nebulizer solution USE ONE VIAL IN NEBULIZER 4 TIMES DAILY AS NEEDED FOR SHORTNESS OF BREATH   levothyroxine (EUTHYROX) 125 MCG tablet Take 1 tablet (125 mcg total) by mouth daily before breakfast.   Lysine HCl 500 MG TABS Take 1 tablet by mouth daily. Take as needed   Magnesium 250 MG TABS Take by mouth 2 (two) times daily.   meclizine (ANTIVERT) 25 MG tablet Take 25 mg by mouth 3 (three) times daily as needed for dizziness.   montelukast (SINGULAIR) 10 MG tablet Take 1 tablet (10 mg total) by mouth at bedtime.   nystatin ointment (MYCOSTATIN) Apply 1 application topically 2 (two) times daily. (Patient taking differently: Apply 1 application topically 2 (two) times daily as needed.)   OVER THE COUNTER MEDICATION Fennel seeds. One tsp with meals   OVER THE COUNTER MEDICATION Tart cherry extract   OVER THE COUNTER MEDICATION Sleep essentials   rosuvastatin (CRESTOR) 5 MG tablet Take 1 tablet on Monday, Wednesday and Friday   tiZANidine (ZANAFLEX) 2 MG tablet 1 qhs prn caution drowsiness   VITAMIN A PO Take 10,000 mcg by mouth daily.    vitamin C (ASCORBIC ACID) 500 MG tablet Take 6,000 mg by mouth daily.    vitamin E 100 UNIT capsule Take 800 Units by mouth daily.      Allergies:    Ambien [zolpidem tartrate], Cefzil [cefprozil], Codeine, Demerol [meperidine], Formaldehyde, Latex, Sulfa antibiotics, and Celexa [citalopram hydrobromide]   Social History: Social History   Socioeconomic History   Marital status: Married    Spouse name: Herbie Baltimore   Number of children: 2   Years of education: Not on file   Highest education level: Not on file  Occupational History    Occupation: retired, Therapist, sports  Tobacco Use   Smoking status: Former    Packs/day: 1.50    Years: 3.00    Pack years: 4.50    Types: Cigarettes    Quit date: 10/03/1968    Years since quitting: 52.5   Smokeless tobacco: Never  Vaping Use   Vaping Use: Never used  Substance and Sexual Activity  Alcohol use: No   Drug use: No   Sexual activity: Not Currently    Birth control/protection: None    Comment: Husband has ED  Other Topics Concern   Not on file  Social History Narrative   2 daughters, Warren Lacy and Sharyn Lull. Amy lives with parents. Sharyn Lull lives in [place].   Social Determinants of Health   Financial Resource Strain: Low Risk    Difficulty of Paying Living Expenses: Not hard at all  Food Insecurity: No Food Insecurity   Worried About Charity fundraiser in the Last Year: Never true   [place] in the Last Year: Never true  Transportation Needs: No Transportation Needs   Lack of Transportation (Medical): No   Lack of Transportation (Non-Medical): No  Physical Activity: Insufficiently Active   Days of Exercise per Week: 3 days   Minutes of Exercise per Session: 20 min  Stress: No Stress Concern Present   Feeling of Stress : Not at all  Social Connections: Socially Integrated   Frequency of Communication with Friends and Family: More than three times a week   Frequency of Social Gatherings with Friends and Family: More than three times a week   Attends Religious Services: 1 to 4 times per year   Active Member of [employer] or Organizations: Yes   Attends Archivist Meetings: 1 to 4 times per year   Marital Status: Married     Family History: The patient's family history includes ADD / ADHD in her daughter and daughter; Alcohol abuse in her maternal uncle and paternal uncle; Anxiety disorder in her sister; Breast cancer in her maternal aunt and paternal aunt; Cancer in her brother, maternal aunt, maternal aunt, mother, paternal aunt, paternal aunt, paternal aunt,  and paternal aunt; Depression in her brother, daughter, daughter, father, maternal uncle, mother, paternal uncle, and sister; Diabetes in her brother, father, and mother; Drug abuse in her brother, maternal uncle, and paternal uncle; Early death in her brother, maternal uncle, and mother; Hearing loss in her maternal grandfather; Heart disease in her maternal grandmother, maternal uncle, and paternal grandfather; Hyperlipidemia in her brother, brother, father, maternal uncle, paternal grandfather, sister, and sister; Hypertension in her father, maternal grandfather, maternal grandmother, maternal uncle, maternal uncle, mother, and paternal grandfather; Liver disease in her sister; Miscarriages / Stillbirths in her sister; Obesity in her maternal grandmother, mother, and paternal grandmother; Ovarian cancer (age of onset: 78) in her mother; Rectal cancer (age of onset: 2) in her brother; Stroke in her father and paternal grandfather. There is no history of Pancreatic cancer or Esophageal cancer.  ROS:   All other ROS reviewed and negative. Pertinent positives noted in the HPI.     EKGs/Labs/Other Studies Reviewed:   The following studies were personally reviewed by me today:  EKG:  EKG is ordered today.  The ekg ordered today demonstrates sinus rhythm heart rate 69, PVCs noted, nonspecific ST-T changes, and was personally reviewed by me.   TTE 02/01/2020  1. Frequent ectopy affects interpretation of LV systolic function. Left  ventricular ejection fraction, by estimation, is 55 to 60%. The left  ventricle has normal function. The left ventricle has no regional wall  motion abnormalities. There is moderate  left ventricular hypertrophy.   2. Right ventricular systolic function is normal. The right ventricular  size is normal.   3. Limited echo to evaluate LV function   Recent Labs: 02/06/2021: ALT 18; TSH 15.000 03/19/2021: BUN 27; Creatinine, Ser 1.51; Potassium 3.9;  Sodium 141   Recent Lipid  Panel    Component Value Date/Time   CHOL 160 02/06/2021 1150   TRIG 175 (H) 02/06/2021 1150   HDL 47 02/06/2021 1150   CHOLHDL 3.4 02/06/2021 1150   CHOLHDL 3.3 05/07/2014 1122   VLDL 24 05/07/2014 1122   LDLCALC 83 02/06/2021 1150    Physical Exam:   VS:  BP 140/82    Pulse 69    Ht 4' 11.05" (1.5 m)    Wt 218 lb 6.4 oz (99.1 kg)    SpO2 98%    BMI 44.04 kg/m    Wt Readings from Last 3 Encounters:  04/06/21 218 lb 6.4 oz (99.1 kg)  02/10/21 221 lb 6.4 oz (100.4 kg)  01/21/21 218 lb 9.6 oz (99.2 kg)    General: Well nourished, well developed, in no acute distress Head: Atraumatic, normal size  Eyes: PEERLA, EOMI  Neck: Supple, no JVD Endocrine: No thryomegaly Cardiac: Normal S1, S2; RRR; no murmurs, rubs, or gallops Lungs: Clear to auscultation bilaterally, no wheezing, rhonchi or rales  Abd: Soft, nontender, no hepatomegaly  Ext: No edema, pulses 2+ Musculoskeletal: No deformities, BUE and BLE strength normal and equal Skin: Warm and dry, no rashes   Neuro: Alert and oriented to person, place, time, and situation, CNII-XII grossly intact, no focal deficits  Psych: Normal mood and affect   ASSESSMENT:   Kristen Mack is a 76 y.o. female who presents for the following: 1. PVC (premature ventricular contraction)   2. SOB (shortness of breath) on exertion   3. Primary hypertension   4. Mixed hyperlipidemia   5. Obesity, morbid, BMI 40.0-49.9 ([place])     PLAN:   1. PVC (premature ventricular contraction) -Evaluated in 2021 for PVCs.  PVC burden 22%.  This was in the setting of iatrogenic hyperthyroidism.  Symptoms of palpitations have improved with lowering her thyroid medication.  They are still working to get the dose correct.  She reports infrequent symptoms of palpitations.  They may occur once per week. -She is still having some PVCs on EKG but her echocardiogram shows normal LV function.  She has no signs of congestive heart failure. -Would recommend to continue  diltiazem.  This will help treat this.  I suspect her thyroid now being low is also contributing.  She will work with her primary care physician on this. -Overall she is stable.  She is okay to proceed with her planned procedure.  We will see her back in 1 year.  2. SOB (shortness of breath) on exertion -Suspect this is related to deconditioning.  She is morbidly obese with a BMI of 44 not active.  Her most recent echo shows normal LV function.  I have encouraged her to become more active.  3. Primary hypertension -No change to medications.  4. Mixed hyperlipidemia -History of statin intolerance.  Tolerating Crestor 5 mg on Mondays, Wednesdays and Fridays.  Her most recent LDL is 80.  This is close enough to her goal given that she has diabetes.  5. Obesity, morbid, BMI 40.0-49.9 ([place]) -Counseled on weight loss.  She will discuss Ozempic with Dr. Wolfgang Phoenix.  Disposition: Return in about 1 year (around 04/06/2022).  Medication Adjustments/Labs and Tests Ordered: Current medicines are reviewed at length with the patient today.  Concerns regarding medicines are outlined above.  Orders Placed This Encounter  Procedures   EKG 12-Lead   No orders of the defined types were placed in this encounter.   Patient Instructions  Medication Instructions:  The current medical regimen is effective;  continue present plan and medications.  *If you need a refill on your cardiac medications before your next appointment, please call your pharmacy*   Follow-Up: At [facility], you and your health needs are our priority.  As part of our continuing mission to provide you with exceptional heart care, we have created designated Provider Care Teams.  These Care Teams include your primary Cardiologist (physician) and Advanced Practice Providers (APPs -  Physician Assistants and Nurse Practitioners) who all work together to provide you with the care you need, when you need it.  We recommend signing up for the  patient portal called "MyChart".  Sign up information is provided on this After Visit Summary.  MyChart is used to connect with patients for Virtual Visits (Telemedicine).  Patients are able to view lab/test results, encounter notes, upcoming appointments, etc.  Non-urgent messages can be sent to your provider as well.   To learn more about what you can do with MyChart, go to [email or web address].    Your next appointment:   12 month(s)  The format for your next appointment:   In Person  Provider:   Evalina Field, MD        Time Spent with Patient: I have spent a total of 35 minutes with patient reviewing hospital notes, telemetry, EKGs, labs and examining the patient as well as establishing an assessment and plan that was discussed with the patient.  > 50% of time was spent in direct patient care.  Signed, Addison Naegeli. Audie Box, MD, [place]  [address] [place], [place] [address] [phone]  04/06/2021 4:44 PM

## 2021-04-03 NOTE — Chronic Care Management (AMB) (Signed)
Chronic Care Management Pharmacy Note  04/03/2021 Name:  ROSALI AUGELLO MRN:  [id number] DOB:  1945/06/26  Summary: Type 2 Diabetes Continue empagliflozin (Jardiance) 10 mg by mouth once daily Continue follow-up with endocrinology Patient reports she can afford her copay for Jardiance until she gets into the coverage gap (donut hole). Medication assistance evaluation complete and patient reports she has previously tried to qualify for medicare Extra Help but has been denied the last few years.  However, based on patient's current household size and income, she qualifies for patient assistance for Jardiance though [employer]. Will help patient complete patient assistance application later this year when she approaches coverage gap (donut hole).  Hyperlipidemia May be reasonable to consider increasing rosuvastatin to 5 mg by mouth daily or add ezetimibe 10 mg by mouth daily  Chronic Kidney Disease Stage 3b - GFR 30-44 (Moderate to Severely Reduced Function) Stable GFR Current medications: empagliflozin (Jardiance) 10 mg by mouth once daily and rosuvastatin 5 mg by mouth three times a week ARB and MRA previously discontinued for unclear reasons Continue follow-up with nephrology   Osteoporosis Follows with endocrinology (Dr. Dorris Fetch) Recommend 1,000 mg of elemental calcium per day from diet or supplementation. Supplemental calcium not necessary if appropriate amount obtained through diet. Recommend 800 - 2,000 IU of vitamin D supplementation per day to maintain adequate vitamin D levels Patient instructed to follow-up with endocrinology regarding plan for treatment with Reclast vs Prolia  Subjective: MONICKA CYRAN is an 76 y.o. year old female who is a primary patient of Luking, Elayne Snare, MD.  The CCM team was consulted for assistance with disease management and care coordination needs.    Engaged with patient by telephone for initial visit in response to provider referral for  pharmacy case management and/or care coordination services.   Consent to Services:  The patient was given the following information about Chronic Care Management services today, agreed to services, and gave verbal consent: 1. CCM service includes personalized support from designated clinical staff supervised by the primary care provider, including individualized plan of care and coordination with other care providers 2. 24/7 contact phone numbers for assistance for urgent and routine care needs. 3. Service will only be billed when office clinical staff spend 20 minutes or more in a month to coordinate care. 4. Only one practitioner may furnish and bill the service in a calendar month. 5.The patient may stop CCM services at any time (effective at the end of the month) by phone call to the office staff. 6. The patient will be responsible for cost sharing (co-pay) of up to 20% of the service fee (after annual deductible is met). Patient agreed to services and consent obtained.  Patient Care Team: Kathyrn Drown, MD as PCP - General (Family Medicine) O'Neal, Cassie Freer, MD as PCP - Cardiology (Cardiology) Beryle Lathe, [facility] (Pharmacist)  Objective:  Lab Results  Component Value Date   CREATININE 1.51 (H) 03/19/2021   CREATININE 1.54 (H) 02/06/2021   CREATININE 1.49 (H) 02/06/2021    Lab Results  Component Value Date   HGBA1C 7.1 (A) 01/21/2021   Last diabetic Eye exam:  Lab Results  Component Value Date/Time   HMDIABEYEEXA No Retinopathy 07/09/2020 02:50 PM    Last diabetic Foot exam: No results found for: HMDIABFOOTEX      Component Value Date/Time   CHOL 160 02/06/2021 1150   TRIG 175 (H) 02/06/2021 1150   HDL 47 02/06/2021 1150   CHOLHDL 3.4  02/06/2021 1150   CHOLHDL 3.3 05/07/2014 1122   VLDL 24 05/07/2014 1122   LDLCALC 83 02/06/2021 1150    Hepatic Function Latest Ref Rng & Units 02/06/2021 10/02/2020 06/25/2020  Total Protein 6.0 - 8.5 g/dL 7.1 7.8 7.4  Albumin  3.7 - 4.7 g/dL 4.1 4.5 4.1  AST 0 - 40 IU/L 20 22 19   ALT 0 - 32 IU/L 18 22 20   Alk Phosphatase 44 - 121 IU/L 81 87 86  Total Bilirubin 0.0 - 1.2 mg/dL 0.4 0.3 0.3  Bilirubin, Direct 0.00 - 0.40 mg/dL - 0.11 0.11    Lab Results  Component Value Date/Time   TSH 15.000 (H) 02/06/2021 11:52 AM   TSH 4.330 07/29/2020 02:42 PM   FREET4 0.82 02/06/2021 11:52 AM   FREET4 1.02 07/29/2020 02:42 PM    CBC Latest Ref Rng & Units 02/18/2018 07/21/2016 04/23/2015  WBC 3.4 - 10.8 x10E3/uL 5.4 5.0 6.9  Hemoglobin 11.1 - 15.9 g/dL 13.4 14.0 14.6  Hematocrit 34.0 - 46.6 % 40.0 42.0 43.8  Platelets 150 - 450 x10E3/uL 396 386(H) 437.0(H)    Lab Results  Component Value Date/Time   VD25OH 36 06/26/2012 02:28 PM    Clinical ASCVD: No  The 10-year ASCVD risk score (Arnett DK, et al., 2019) is: 42.3%   Values used to calculate the score:     Age: 76 years     Sex: Female     Is Non-Hispanic African American: No     Diabetic: Yes     Tobacco smoker: No     Systolic Blood Pressure: 263 mmHg     Is BP treated: Yes     HDL Cholesterol: 47 mg/dL     Total Cholesterol: 160 mg/dL    Social History   Tobacco Use  Smoking Status Former   Packs/day: 1.50   Years: 3.00   Pack years: 4.50   Types: Cigarettes   Quit date: 10/03/1968   Years since quitting: 52.5  Smokeless Tobacco Never   BP Readings from Last 3 Encounters:  02/10/21 134/82  01/21/21 124/78  12/02/20 (!) 142/92   Pulse Readings from Last 3 Encounters:  01/21/21 64  12/02/20 63  10/07/20 82   Wt Readings from Last 3 Encounters:  02/10/21 221 lb 6.4 oz (100.4 kg)  01/21/21 218 lb 9.6 oz (99.2 kg)  12/30/20 215 lb (97.5 kg)    Assessment: Review of patient past medical history, allergies, medications, health status, including review of consultants reports, laboratory and other test data, was performed as part of comprehensive evaluation and provision of chronic care management services.   SDOH:  (Social Determinants of  Health) assessments and interventions performed:  SDOH Interventions    Flowsheet Row Most Recent Value  SDOH Interventions   SDOH Interventions for the Following Domains Financial Strain  Financial Strain Interventions Other (Comment)  [Medication Assistance Evaluation Completed]       CCM Care Plan  Allergies  Allergen Reactions   Ambien [Zolpidem Tartrate] Other (See Comments)    Side effect "lost a couple days"; memory loss   Cefzil [Cefprozil]     diarrhea   Codeine Itching and Nausea And Vomiting   Demerol [Meperidine] Other (See Comments)    "messes up senses"   Formaldehyde    Latex Other (See Comments)    "skin gets raw"   Sulfa Antibiotics Other (See Comments)    Severe abd cramp   Celexa [Citalopram Hydrobromide] Other (See Comments)    Mouth sores  Medications Reviewed Today     Reviewed by Beryle Lathe, [facility] (Pharmacist) on 04/03/21 at 1541  Med List Status: <None>   Medication Order Taking? Sig Documenting Provider Last Dose Status Informant  albuterol (VENTOLIN HFA) 108 (90 Base) MCG/ACT inhaler [id number] Yes INHALE 2 PUFFS INTO LUNGS EVERY 6 HOURS AS NEEDED FOR SHORTNESS OF BREATH Kathyrn Drown, MD Taking Active   Azelastine HCl 137 MCG/SPRAY SOLN [id number] Yes USE 2 SPRAY(S) IN EACH NOSTRIL TWICE DAILY Luking, Elayne Snare, MD Taking Active   B Complex Vitamins (B COMPLEX 100 PO) [id number] Yes Take 1 tablet by mouth daily. [provider] Taking Active   buPROPion (WELLBUTRIN XL) 300 MG 24 hr tablet [id number] Yes Take 1 tablet (300 mg total) by mouth daily. Kathyrn Drown, MD Taking Active   Chromium 1000 MCG TABS [id number] Yes Take 1 tablet by mouth daily.  [provider] Taking Active Self  Coenzyme Q10 (COQ10) 200 MG CAPS [id number] Yes Take 2 capsules by mouth daily. [provider] Taking Active   Cyanocobalamin (B-12) 1000 MCG SUBL [id number] Yes Place 1 tablet under the tongue daily.  [provider] Taking  Active Self  DIGESTIVE ENZYMES PO [id number] Yes Take 1 tablet by mouth 3 (three) times daily.  [provider] Taking Active Self  diltiazem (CARDIZEM CD) 120 MG 24 hr capsule [id number] Yes Take 1 capsule by mouth once daily O'Neal, Cassie Freer, MD Taking Active   empagliflozin (JARDIANCE) 10 MG TABS tablet [id number] Yes Take 1 tablet (10 mg total) by mouth daily before breakfast. Kathyrn Drown, MD Taking Active   glucose blood (ONETOUCH VERIO) test strip [id number]  Test once daily Sallee Lange A, MD  Active   ipratropium (ATROVENT) 0.02 % nebulizer solution [id number] Yes USE ONE VIAL IN NEBULIZER 4 TIMES DAILY AS NEEDED FOR SHORTNESS OF BREATH Kathyrn Drown, MD Taking Active   levothyroxine (EUTHYROX) 125 MCG tablet [id number] Yes Take 1 tablet (125 mcg total) by mouth daily before breakfast. Cassandria Anger, MD Taking Active   Lysine HCl 500 MG TABS [id number] Yes Take 1 tablet by mouth daily. Take as needed [provider] Taking Active Self  Magnesium 250 MG TABS [id number] Yes Take by mouth 2 (two) times daily. [provider] Taking Active Self           Med Note Argie Ramming Jun 30, 2020 10:12 AM) Triple Mag   meclizine (ANTIVERT) 25 MG tablet [id number] Yes Take 25 mg by mouth 3 (three) times daily as needed for dizziness. [provider] Taking Active   montelukast (SINGULAIR) 10 MG tablet [id number] Yes Take 1 tablet (10 mg total) by mouth at bedtime. Kathyrn Drown, MD Taking Active   nystatin ointment (MYCOSTATIN) [id number] Yes Apply 1 application topically 2 (two) times daily.  Patient taking differently: Apply 1 application topically 2 (two) times daily as needed.   Kathyrn Drown, MD Taking Active Self  OVER THE COUNTER MEDICATION [id number] Yes Fennel seeds. One tsp with meals [provider] Taking Active   OVER THE COUNTER MEDICATION [id number] Yes Tart cherry extract [provider] Taking Active   OVER THE COUNTER  MEDICATION [id number] Yes Sleep essentials [provider] Taking Active   rosuvastatin (CRESTOR) 5 MG tablet [id number] Yes Take 1 tablet on Monday, Wednesday and Friday Kathyrn Drown, MD Taking Active   tiZANidine (ZANAFLEX) 2 MG tablet [id number] Yes 1 qhs prn caution drowsiness Kathyrn Drown, MD  Taking Active   VITAMIN A PO [id number] Yes Take 10,000 mcg by mouth daily.  [provider] Taking Active Self  vitamin C (ASCORBIC ACID) 500 MG tablet [id number] Yes Take 6,000 mg by mouth daily.  [provider] Taking Active Self  vitamin E 100 UNIT capsule [id number] Yes Take 800 Units by mouth daily.  [provider] Taking Active Self            Patient Active Problem List   Diagnosis Date Noted   Bruising tendency 06/30/2020   Mixed hyperlipidemia 02/13/2020   Diabetes mellitus without complication ([place]) 21/30/8657   Sleep apnea 07/11/2017   Pancreatic pseudocyst 05/10/2017   Adrenal adenoma 05/10/2017   Osteoporosis 11/21/2016   Fibromyalgia 08/10/2016   Female stress incontinence 11/05/2015   NASH (nonalcoholic steatohepatitis) 04/24/2015   Orthostatic hypotension 09/19/2013   Elevated transaminase level 09/19/2013   HTN (hypertension), benign 09/03/2013   Depression 08/14/2013   Fatty liver 08/14/2013   Other specified hypothyroidism 10/23/2012   Osteopenia 07/08/2012   Morbid obesity ([place]) 07/08/2012   Urinary bladder incontinence 04/30/2012   Family history of rectal cancer 10/04/2011   GERD (gastroesophageal reflux disease) 10/04/2011   IBS (irritable bowel syndrome) 10/04/2011   Multiple chemical sensitivity syndrome 11/27/1968    Immunization History  Administered Date(s) Administered   Fluad Quad(high Dose 65+) 01/22/2020, 02/10/2021   Influenza, High Dose Seasonal PF 04/15/2018, 01/13/2019   Influenza,inj,Quad PF,6+ Mos 03/02/2016, 03/03/2017   Influenza-Unspecified 04/15/2018, 01/15/2019   Moderna Sars-Covid-2 Vaccination  05/23/2019, 06/19/2019, 03/05/2020   Pneumococcal Conjugate-13 05/22/2014   Pneumococcal Polysaccharide-23 03/03/2017   Tdap 04/18/2020   Zoster Recombinat (Shingrix) 12/08/2018, 04/27/2019    Conditions to be addressed/monitored: HTN, HLD, DMII, CKD Stage 3b, and Osteoporosis  Care Plan : Medication Management  Updates made by Beryle Lathe, [place] since 04/03/2021 12:00 AM     Problem: T2DM, HTN, HLD, Osteoporosis, CKD   Priority: High  Onset Date: 04/03/2021     Goal: Disease Progression Prevention   Start Date: 04/03/2021  Expected End Date: 07/02/2021  This Visit's Progress: On track  Priority: High  Note:   Current Barriers:  Unable to independently afford treatment regimen Unable to achieve control of hyperlipidemia and osteoporosis  Pharmacist Clinical Goal(s):  Through collaboration with PharmD and provider, patient will  Verbalize ability to afford treatment regimen Achieve control of hyperlipidemia and osteoporosis as evidenced by improved LDL, improved triglycerides, and improved/stabilized bone mineral density   Interventions: 1:1 collaboration with Kathyrn Drown, MD regarding development and update of comprehensive plan of care as evidenced by provider attestation and co-signature Inter-disciplinary care team collaboration (see longitudinal plan of care) Comprehensive medication review performed; medication list updated in electronic medical record  Type 2 Diabetes - New goal.: Follows with endocrinology (Dr. Dorris Fetch) Controlled; Most recent A1c 7.1 which is near goal of <7% per ADA guidelines Current medications: empagliflozin (Jardiance) 10 mg by mouth once daily Patient was previously on Januvia 100 mg daily which was not appropriately renally dose adjusted Intolerances:  none, but metformin discontinued due to renal function Taking medications as directed: yes Side effects thought to be attributed to current medication regimen: no Hypoglycemia prevention:  not indicated at this time Current meal patterns: not discussed today Current exercise: not discussed today On a statin: yes On aspirin 81 mg daily: no Last microalbumin/creatinine ratio: 36 (06/25/20); on an ACEi/ARB: no Last eye exam: completed within last year Last foot exam: completed within last year Current glucose readings:  not discussed today Continue empagliflozin (Jardiance) 10 mg by mouth once daily Continue follow-up with endocrinology Patient reports she can afford her copay for Jardiance until she gets into the coverage gap (donut hole). Medication assistance evaluation complete and patient reports she has previously tried to qualify for medicare Extra Help but has been denied the last few years.  However, based on patient's current household size and income, she qualifies for patient assistance for Jardiance though [employer]. Will help patient complete patient assistance application later this year when she approaches coverage gap (donut hole).  Hypertension - New goal.: Blood pressure under good control. Blood pressure is at goal of <130/80 mmHg per 2017 AHA/ACC guidelines. Current medications: diltiazem 120 mg by mouth once daily ARB and MRA previously discontinued for unclear reasons Patient has OSA and occasional orthostatic hypotension Intolerances: none Taking medications as directed: yes Side effects thought to be attributed to current medication regimen: no Current home blood pressure: patient does not check Continue current medications as above Encourage dietary sodium restriction/DASH diet Recommend home blood pressure monitoring to discuss at next visit  Hyperlipidemia - New goal.: Uncontrolled. LDL above goal of <70 due to very high risk given 10-year risk >20% per 2020 AACE/ACE guidelines. Triglycerides above goal of <150 per 2020 AACE/ACE guidelines. Current medications: rosuvastatin 5 mg by mouth three times a week Intolerances: none Taking medications as  directed: yes Side effects thought to be attributed to current medication regimen: no Continue rosuvastatin 5 mg by mouth three times a week Encourage dietary reduction of high fat containing foods such as butter, nuts, bacon, egg yolks, etc. Re-check lipid panel in 4-12 weeks May be reasonable to consider increasing rosuvastatin to 5 mg by mouth daily or add ezetimibe 10 mg by mouth daily  Chronic Kidney Disease Stage 3b - GFR 30-44 (Moderate to Severely Reduced Function) - New goal.: Follow with nephrology Stable GFR Current medications: empagliflozin (Jardiance) 10 mg by mouth once daily and rosuvastatin 5 mg by mouth three times a week ARB and MRA previously discontinued for unclear reasons Intolerances: none Taking medications as directed: yes Side effects thought to be attributed to current medication regimen: no Most recent GFR: 36 mL/min Most recent microalbumin: 36 (06/15/20) Continue current medications as above Encouraged healthy diet including more fruits and vegetables Recommend adequate hydration  Continue follow-up with nephrology   Osteoporosis - New goal.: Follows with endocrinology (Dr. Dorris Fetch) Suboptimally managed Current medications:  none currently. Discussion regarding Reclast vs Prolia Intolerances: none Taking medications as directed: n/a Side effects thought to be attributed to current medication regimen: n/a Last DEXA (07/22/20): Spine L1-L3 T- 2.7, R femur neck T -3.3, total femur mean T-2.2 Falls reported in last year: unknown Recommend 1,000 mg of elemental calcium per day from diet or supplementation. Supplemental calcium not necessary if appropriate amount obtained through diet. Recommend 800 - 2,000 IU of vitamin D supplementation per day to maintain adequate vitamin D levels Recommend walking, low impact aerobic exercise, or strength training Patient instructed to follow-up with endocrinology regarding plan for treatment with Reclast vs Prolia  Patient  Goals/Self-Care Activities Patient will:  Focus on medication adherence by keeping up with prescription refills and either using a pill box or reminders to take your medications at the prescribed times Collaborate with provider on medication access solutions Engage in dietary modifications by decreased fat intake and fewer sweetened foods & beverages  Follow Up Plan: Telephone follow up appointment with care management team member scheduled for: 07/03/21  Medication Assistance:  Will plan to apply for Jardiance through [facility] patient assistance program when patient approaches coverage gap (donut hole)   Patient's preferred pharmacy is:  [facility] ([place]) [place], [place] [address] [place] [address] Phone: [phone] Fax: [phone]  [facility] [address] [place] - [address] [place] [address] [place] [address] [place] [address] Phone: [phone] Fax: [phone]  [facility]) - [place], [place] [address] Phone: [phone] Fax: [phone]  Follow Up:  Patient agrees to Care Plan and Follow-up.  Plan: Telephone follow up appointment with care management team member scheduled for:  07/03/21  Kennon Holter, PharmD, BCACP, CPP Clinical Pharmacist Practitioner [place] [phone]

## 2021-04-03 NOTE — Patient Instructions (Signed)
Kristen Mack,  It was great to talk to you today!  Please call me with any questions or concerns.   Visit Information   Following is a copy of your full care plan:  Care Plan : Medication Management  Updates made by Beryle Lathe, [place] since 04/03/2021 12:00 AM     Problem: T2DM, HTN, HLD, Osteoporosis, CKD   Priority: High  Onset Date: 04/03/2021     Goal: Disease Progression Prevention   Start Date: 04/03/2021  Expected End Date: 07/02/2021  This Visit's Progress: On track  Priority: High  Note:   Current Barriers:  Unable to independently afford treatment regimen Unable to achieve control of hyperlipidemia and osteoporosis  Pharmacist Clinical Goal(s):  Through collaboration with PharmD and provider, patient will  Verbalize ability to afford treatment regimen Achieve control of hyperlipidemia and osteoporosis as evidenced by improved LDL, improved triglycerides, and improved/stabilized bone mineral density   Interventions: 1:1 collaboration with Kathyrn Drown, MD regarding development and update of comprehensive plan of care as evidenced by provider attestation and co-signature Inter-disciplinary care team collaboration (see longitudinal plan of care) Comprehensive medication review performed; medication list updated in electronic medical record  Type 2 Diabetes - New goal.: Follows with endocrinology (Dr. Dorris Fetch) Controlled; Most recent A1c 7.1 which is near goal of <7% per ADA guidelines Current medications: empagliflozin (Jardiance) 10 mg by mouth once daily Patient was previously on Januvia 100 mg daily which was not appropriately renally dose adjusted Intolerances:  none, but metformin discontinued due to renal function Taking medications as directed: yes Side effects thought to be attributed to current medication regimen: no Hypoglycemia prevention: not indicated at this time Current meal patterns: not discussed today Current exercise: not discussed  today On a statin: yes On aspirin 81 mg daily: no Last microalbumin/creatinine ratio: 36 (06/25/20); on an ACEi/ARB: no Last eye exam: completed within last year Last foot exam: completed within last year Current glucose readings:  not discussed today Continue empagliflozin (Jardiance) 10 mg by mouth once daily Continue follow-up with endocrinology Patient reports she can afford her copay for Jardiance until she gets into the coverage gap (donut hole). Medication assistance evaluation complete and patient reports she has previously tried to qualify for medicare Extra Help but has been denied the last few years.  However, based on patient's current household size and income, she qualifies for patient assistance for Jardiance though [employer]. Will help patient complete patient assistance application later this year when she approaches coverage gap (donut hole).  Hypertension - New goal.: Blood pressure under good control. Blood pressure is at goal of <130/80 mmHg per 2017 AHA/ACC guidelines. Current medications: diltiazem 120 mg by mouth once daily ARB and MRA previously discontinued for unclear reasons Patient has OSA and occasional orthostatic hypotension Intolerances: none Taking medications as directed: yes Side effects thought to be attributed to current medication regimen: no Current home blood pressure: patient does not check Continue current medications as above Encourage dietary sodium restriction/DASH diet Recommend home blood pressure monitoring to discuss at next visit  Hyperlipidemia - New goal.: Uncontrolled. LDL above goal of <70 due to very high risk given 10-year risk >20% per 2020 AACE/ACE guidelines. Triglycerides above goal of <150 per 2020 AACE/ACE guidelines. Current medications: rosuvastatin 5 mg by mouth three times a week Intolerances: none Taking medications as directed: yes Side effects thought to be attributed to current medication regimen: no Continue  rosuvastatin 5 mg by mouth three times a week Encourage dietary  reduction of high fat containing foods such as butter, nuts, bacon, egg yolks, etc. Re-check lipid panel in 4-12 weeks May be reasonable to consider increasing rosuvastatin to 5 mg by mouth daily or add ezetimibe 10 mg by mouth daily  Chronic Kidney Disease Stage 3b - GFR 30-44 (Moderate to Severely Reduced Function) - New goal.: Follow with nephrology Stable GFR Current medications: empagliflozin (Jardiance) 10 mg by mouth once daily and rosuvastatin 5 mg by mouth three times a week ARB and MRA previously discontinued for unclear reasons Intolerances: none Taking medications as directed: yes Side effects thought to be attributed to current medication regimen: no Most recent GFR: 36 mL/min Most recent microalbumin: 36 (06/15/20) Continue current medications as above Encouraged healthy diet including more fruits and vegetables Recommend adequate hydration  Continue follow-up with nephrology   Osteoporosis - New goal.: Follows with endocrinology (Dr. Dorris Fetch) Suboptimally managed Current medications:  none currently. Discussion regarding Reclast vs Prolia Intolerances: none Taking medications as directed: n/a Side effects thought to be attributed to current medication regimen: n/a Last DEXA (07/22/20): Spine L1-L3 T- 2.7, R femur neck T -3.3, total femur mean T-2.2 Falls reported in last year: unknown Recommend 1,000 mg of elemental calcium per day from diet or supplementation. Supplemental calcium not necessary if appropriate amount obtained through diet. Recommend 800 - 2,000 IU of vitamin D supplementation per day to maintain adequate vitamin D levels Recommend walking, low impact aerobic exercise, or strength training Patient instructed to follow-up with endocrinology regarding plan for treatment with Reclast vs Prolia  Patient Goals/Self-Care Activities Patient will:  Focus on medication adherence by keeping up with  prescription refills and either using a pill box or reminders to take your medications at the prescribed times Collaborate with provider on medication access solutions Engage in dietary modifications by decreased fat intake and fewer sweetened foods & beverages  Follow Up Plan: Telephone follow up appointment with care management team member scheduled for: 07/03/21      Consent to CCM Services: Kristen Mack was given information about Chronic Care Management services including:  CCM service includes personalized support from designated clinical staff supervised by her physician, including individualized plan of care and coordination with other care providers 24/7 contact phone numbers for assistance for urgent and routine care needs. Service will only be billed when office clinical staff spend 20 minutes or more in a month to coordinate care. Only one practitioner may furnish and bill the service in a calendar month. The patient may stop CCM services at any time (effective at the end of the month) by phone call to the office staff. The patient will be responsible for cost sharing (co-pay) of up to 20% of the service fee (after annual deductible is met).  Patient agreed to services and verbal consent obtained.   Plan: Telephone follow up appointment with care management team member scheduled for:  07/03/21  Kennon Holter, PharmD, BCACP, CPP Clinical Pharmacist Practitioner [place] [phone]  Please call the care guide team at [phone] if you need to cancel or reschedule your appointment.   Patient verbalizes understanding of instructions provided today and agrees to view in [place].

## 2021-04-06 ENCOUNTER — Ambulatory Visit (INDEPENDENT_AMBULATORY_CARE_PROVIDER_SITE_OTHER): Payer: HMO | Admitting: Cardiovascular Disease

## 2021-04-06 ENCOUNTER — Encounter: Payer: Self-pay | Admitting: Cardiovascular Disease

## 2021-04-06 ENCOUNTER — Other Ambulatory Visit: Payer: Self-pay

## 2021-04-06 VITALS — BP 140/82 | HR 69 | Ht 59.05 in | Wt 218.4 lb

## 2021-04-06 DIAGNOSIS — E782 Mixed hyperlipidemia: Secondary | ICD-10-CM | POA: Diagnosis not present

## 2021-04-06 DIAGNOSIS — I10 Essential (primary) hypertension: Secondary | ICD-10-CM

## 2021-04-06 DIAGNOSIS — R0602 Shortness of breath: Secondary | ICD-10-CM

## 2021-04-06 DIAGNOSIS — E1122 Type 2 diabetes mellitus with diabetic chronic kidney disease: Secondary | ICD-10-CM | POA: Diagnosis not present

## 2021-04-06 DIAGNOSIS — R350 Frequency of micturition: Secondary | ICD-10-CM | POA: Diagnosis not present

## 2021-04-06 DIAGNOSIS — I493 Ventricular premature depolarization: Secondary | ICD-10-CM

## 2021-04-06 DIAGNOSIS — N1832 Chronic kidney disease, stage 3b: Secondary | ICD-10-CM | POA: Diagnosis not present

## 2021-04-06 DIAGNOSIS — I129 Hypertensive chronic kidney disease with stage 1 through stage 4 chronic kidney disease, or unspecified chronic kidney disease: Secondary | ICD-10-CM | POA: Diagnosis not present

## 2021-04-06 NOTE — Patient Instructions (Signed)
Medication Instructions:  The current medical regimen is effective;  continue present plan and medications.  *If you need a refill on your cardiac medications before your next appointment, please call your pharmacy*   Follow-Up: At [facility], you and your health needs are our priority.  As part of our continuing mission to provide you with exceptional heart care, we have created designated Provider Care Teams.  These Care Teams include your primary Cardiologist (physician) and Advanced Practice Providers (APPs -  Physician Assistants and Nurse Practitioners) who all work together to provide you with the care you need, when you need it.  We recommend signing up for the patient portal called "MyChart".  Sign up information is provided on this After Visit Summary.  MyChart is used to connect with patients for Virtual Visits (Telemedicine).  Patients are able to view lab/test results, encounter notes, upcoming appointments, etc.  Non-urgent messages can be sent to your provider as well.   To learn more about what you can do with MyChart, go to [email or web address].    Your next appointment:   12 month(s)  The format for your next appointment:   In Person  Provider:   Evalina Field, MD

## 2021-04-08 ENCOUNTER — Encounter: Payer: HMO | Admitting: Physical Therapy

## 2021-04-15 ENCOUNTER — Encounter: Payer: Self-pay | Admitting: Family Medicine

## 2021-04-15 ENCOUNTER — Encounter: Payer: HMO | Admitting: Physical Therapy

## 2021-04-15 NOTE — Progress Notes (Addendum)
Surgical Instructions    Your procedure is scheduled on 04/23/21.  Report to [facility] Main Entrance "A" at 11:30 A.M., then check in with the Admitting office.  Call this number if you have problems the morning of surgery:  [phone]   If you have any questions prior to your surgery date call [phone]: Open Monday-Friday 8am-4pm    Remember:  Do not eat or drink after midnight the night before your surgery     Take these medicines the morning of surgery with A SIP OF WATER: Azelastine HCl - nasal spray buPROPion (WELLBUTRIN XL)  diltiazem (CARDIZEM CD) levothyroxine (EUTHYROX)   AS NEEDED: albuterol (VENTOLIN HFA) ipratropium (ATROVENT) meclizine (ANTIVERT)   As of today, STOP taking any Aspirin (unless otherwise instructed by your surgeon) Aleve, Naproxen, Ibuprofen, Motrin, Advil, Goody's, BC's, all herbal medications, fish oil, and all vitamins.   WHAT DO I DO ABOUT MY DIABETES MEDICATION?   Do not take oral diabetes medicines (pills) the morning of surgery.  DO NOT take Jardiance the day before surgery or the day of surgery.   HOW TO MANAGE YOUR DIABETES BEFORE AND AFTER SURGERY  Why is it important to control my blood sugar before and after surgery? Improving blood sugar levels before and after surgery helps healing and can limit problems. A way of improving blood sugar control is eating a healthy diet by:  Eating less sugar and carbohydrates  Increasing activity/exercise  Talking with your doctor about reaching your blood sugar goals High blood sugars (greater than 180 mg/dL) can raise your risk of infections and slow your recovery, so you will need to focus on controlling your diabetes during the weeks before surgery. Make sure that the doctor who takes care of your diabetes knows about your planned surgery including the date and location.  How do I manage my blood sugar before surgery? Check your blood sugar at least 4 times a day, starting 2 days  before surgery, to make sure that the level is not too high or low.  Check your blood sugar the morning of your surgery when you wake up and every 2 hours until you get to the Short Stay unit.  If your blood sugar is less than 70 mg/dL, you will need to treat for low blood sugar: Do not take insulin. Treat a low blood sugar (less than 70 mg/dL) with  cup of clear juice (cranberry or apple), 4 glucose tablets, OR glucose gel. Recheck blood sugar in 15 minutes after treatment (to make sure it is greater than 70 mg/dL). If your blood sugar is not greater than 70 mg/dL on recheck, call [phone] for further instructions. Report your blood sugar to the short stay nurse when you get to Short Stay.  If you are admitted to the hospital after surgery: Your blood sugar will be checked by the staff and you will probably be given insulin after surgery (instead of oral diabetes medicines) to make sure you have good blood sugar levels. The goal for blood sugar control after surgery is 80-180 mg/dL.            Do not wear jewelry or makeup Do not wear lotions, powders, perfumes or deodorant. Do not shave 48 hours prior to surgery.   Do not bring valuables to the hospital. DO Not wear nail polish, gel polish, artificial nails, or any other type of covering on natural nails (fingers and toes) If you have artificial nails or gel coating that need to be removed by  a nail salon, please have this removed prior to surgery. Artificial nails or gel coating may interfere with anesthesia's ability to adequately monitor your vital signs.             [facility] is not responsible for any belongings or valuables.  Do NOT Smoke (Tobacco/Vaping)  24 hours prior to your procedure  If you use a CPAP at night, you may bring your mask for your overnight stay.   Contacts, glasses, hearing aids, dentures or partials may not be worn into surgery, please bring cases for these belongings   For patients admitted to the  hospital, discharge time will be determined by your treatment team.   Patients discharged the day of surgery will not be allowed to drive home, and someone needs to stay with them for 24 hours.  NO VISITORS WILL BE ALLOWED IN PRE-OP WHERE PATIENTS ARE PREPPED FOR SURGERY.  ONLY 1 SUPPORT PERSON MAY BE PRESENT IN THE WAITING ROOM WHILE YOU ARE IN SURGERY.  IF YOU ARE TO BE ADMITTED, ONCE YOU ARE IN YOUR ROOM YOU WILL BE ALLOWED TWO (2) VISITORS. 1 (ONE) VISITOR MAY STAY OVERNIGHT BUT MUST ARRIVE TO THE ROOM BY 8pm.  Minor children may have two parents present. Special consideration for safety and communication needs will be reviewed on a case by case basis.  Special instructions:    Oral Hygiene is also important to reduce your risk of infection.  Remember - BRUSH YOUR TEETH THE MORNING OF SURGERY WITH YOUR REGULAR TOOTHPASTE   [facility]- Preparing For Surgery  Before surgery, you can play an important role. Because skin is not sterile, your skin needs to be as free of germs as possible. You can reduce the number of germs on your skin by washing with CHG (chlorahexidine gluconate) Soap before surgery.  CHG is an antiseptic cleaner which kills germs and bonds with the skin to continue killing germs even after washing.     Please do not use if you have an allergy to CHG or antibacterial soaps. If your skin becomes reddened/irritated stop using the CHG.  Do not shave (including legs and underarms) for at least 48 hours prior to first CHG shower. It is OK to shave your face.  Please follow these instructions carefully.     Shower the NIGHT BEFORE SURGERY and the MORNING OF SURGERY with CHG Soap.   If you chose to wash your hair, wash your hair first as usual with your normal shampoo. After you shampoo, rinse your hair and body thoroughly to remove the shampoo.  Then [employer] and genitals (private parts) with your normal soap and rinse thoroughly to remove soap.  After that Use CHG Soap as you  would any other liquid soap. You can apply CHG directly to the skin and wash gently with a scrungie or a clean washcloth.   Apply the CHG Soap to your body ONLY FROM THE NECK DOWN.  Do not use on open wounds or open sores. Avoid contact with your eyes, ears, mouth and genitals (private parts). Wash Face and genitals (private parts)  with your normal soap.   Wash thoroughly, paying special attention to the area where your surgery will be performed.  Thoroughly rinse your body with warm water from the neck down.  DO NOT shower/wash with your normal soap after using and rinsing off the CHG Soap.  Pat yourself dry with a CLEAN TOWEL.  Wear CLEAN PAJAMAS to bed the night before surgery  Place  CLEAN SHEETS on your bed the night before your surgery  DO NOT SLEEP WITH PETS.   Day of Surgery:  Take a shower with CHG soap. Wear Clean/Comfortable clothing the morning of surgery Do not apply any deodorants/lotions.   Remember to brush your teeth WITH YOUR REGULAR TOOTHPASTE.   Please read over the following fact sheets that you were given.

## 2021-04-16 ENCOUNTER — Inpatient Hospital Stay (HOSPITAL_COMMUNITY)
Admission: RE | Admit: 2021-04-16 | Discharge: 2021-04-16 | Disposition: A | Discharge status: Home / Self Care | Payer: HMO | Source: Ambulatory Visit

## 2021-04-16 NOTE — Telephone Encounter (Signed)
May stop Jardiance  Check glucose randomly twice daily occasionally in the morning occasionally before meal occasionally 2 hours after meal  Record these on paper preferably Send [place] some readings within a couple weeks  We can certainly do endocrinology consult if she is interested otherwise when we get the results of her readings we can suggest other potential medications  She has a follow-up in February where we can discuss this in more detail  Please send [place] some readings within 2 weeks time as requested  Thank you

## 2021-04-17 ENCOUNTER — Other Ambulatory Visit: Payer: Self-pay

## 2021-04-17 ENCOUNTER — Encounter (HOSPITAL_COMMUNITY): Payer: Self-pay

## 2021-04-17 ENCOUNTER — Encounter (HOSPITAL_COMMUNITY)
Admission: RE | Admit: 2021-04-17 | Discharge: 2021-04-17 | Disposition: A | Discharge status: Home / Self Care | Payer: HMO | Source: Ambulatory Visit | Attending: Obstetrics & Gynecology | Admitting: Obstetrics & Gynecology

## 2021-04-17 VITALS — BP 140/70 | HR 66 | Temp 98.3°F | Resp 18 | Ht 59.0 in | Wt 219.5 lb

## 2021-04-17 DIAGNOSIS — E0590 Thyrotoxicosis, unspecified without thyrotoxic crisis or storm: Secondary | ICD-10-CM | POA: Insufficient documentation

## 2021-04-17 DIAGNOSIS — E119 Type 2 diabetes mellitus without complications: Secondary | ICD-10-CM | POA: Diagnosis not present

## 2021-04-17 DIAGNOSIS — Z9181 History of falling: Secondary | ICD-10-CM | POA: Insufficient documentation

## 2021-04-17 DIAGNOSIS — Z79890 Hormone replacement therapy (postmenopausal): Secondary | ICD-10-CM | POA: Insufficient documentation

## 2021-04-17 DIAGNOSIS — I493 Ventricular premature depolarization: Secondary | ICD-10-CM | POA: Diagnosis not present

## 2021-04-17 DIAGNOSIS — Z01812 Encounter for preprocedural laboratory examination: Secondary | ICD-10-CM | POA: Insufficient documentation

## 2021-04-17 DIAGNOSIS — H9390 Unspecified disorder of ear, unspecified ear: Secondary | ICD-10-CM | POA: Insufficient documentation

## 2021-04-17 DIAGNOSIS — G4733 Obstructive sleep apnea (adult) (pediatric): Secondary | ICD-10-CM | POA: Diagnosis not present

## 2021-04-17 HISTORY — DX: Ventricular premature depolarization: I49.3

## 2021-04-17 LAB — CBC
HCT: 40.1 % (ref 36.0–46.0)
Hemoglobin: 13 g/dL (ref 12.0–15.0)
MCH: 28.8 pg (ref 26.0–34.0)
MCHC: 32.4 g/dL (ref 30.0–36.0)
MCV: 88.9 fL (ref 80.0–100.0)
Platelets: 379 10*3/uL (ref 150–400)
RBC: 4.51 MIL/uL (ref 3.87–5.11)
RDW: 13.2 % (ref 11.5–15.5)
WBC: 9.2 10*3/uL (ref 4.0–10.5)
nRBC: 0 % (ref 0.0–0.2)

## 2021-04-17 LAB — BASIC METABOLIC PANEL
Anion gap: 11 (ref 5–15)
BUN: 24 mg/dL — ABNORMAL HIGH (ref 8–23)
CO2: 22 mmol/L (ref 22–32)
Calcium: 9.4 mg/dL (ref 8.9–10.3)
Chloride: 105 mmol/L (ref 98–111)
Creatinine, Ser: 1.75 mg/dL — ABNORMAL HIGH (ref 0.44–1.00)
GFR, Estimated: 30 mL/min — ABNORMAL LOW (ref >60)
Glucose, Bld: 207 mg/dL — ABNORMAL HIGH (ref 70–99)
Potassium: 3.1 mmol/L — ABNORMAL LOW (ref 3.5–5.1)
Sodium: 138 mmol/L (ref 135–145)

## 2021-04-17 LAB — HEMOGLOBIN A1C
Hgb A1c MFr Bld: 7.5 % — ABNORMAL HIGH (ref 4.8–5.6)
Mean Plasma Glucose: 169 mg/dL

## 2021-04-17 LAB — TYPE AND SCREEN
ABO/RH(D): A POS
Antibody Screen: NEGATIVE

## 2021-04-17 LAB — GLUCOSE, CAPILLARY: Glucose-Capillary: 200 mg/dL — ABNORMAL HIGH (ref 70–99)

## 2021-04-17 NOTE — Progress Notes (Signed)
PCP - Dr. Sallee Lange Cardiologist - Dr. Lake Bells T. O'Neal- Clearance obtained 04/06/21 (per note)  PPM/ICD - denies   Chest x-ray - 04/26/17 EKG - 04/06/21 Stress Test - 2006 in [place], no ischemia per pt ECHO - 02/01/20 Cardiac Cath - denies  Sleep Study - 05/08/17, OSA+ CPAP - yes  DM- Type 2 Fasting Blood Sugar - 200-210 (Pt states Dr. Is adjusting meds) Checks Blood Sugar 1-3 times a day  Blood Thinner Instructions: n/a Aspirin Instructions: n/a  ERAS Protcol - no, NPO   COVID TEST- n/a, ambulatory surgery   Anesthesia review: no  Patient denies shortness of breath, fever, cough and chest pain at PAT appointment   All instructions explained to the patient, with a verbal understanding of the material. Patient agrees to go over the instructions while at home for a better understanding. Patient also instructed to wear a mask in public for 3 days prior to surgery. The opportunity to ask questions was provided.

## 2021-04-18 ENCOUNTER — Encounter (HOSPITAL_COMMUNITY): Payer: Self-pay | Admitting: *Deleted

## 2021-04-18 ENCOUNTER — Other Ambulatory Visit: Payer: Self-pay

## 2021-04-18 ENCOUNTER — Emergency Department (HOSPITAL_COMMUNITY)
Admission: EM | Admit: 2021-04-18 | Discharge: 2021-04-18 | Disposition: A | Discharge status: Home / Self Care | Payer: HMO | Attending: Emergency Medicine | Admitting: Emergency Medicine

## 2021-04-18 ENCOUNTER — Emergency Department (HOSPITAL_COMMUNITY): Payer: HMO

## 2021-04-18 DIAGNOSIS — S0181XA Laceration without foreign body of other part of head, initial encounter: Secondary | ICD-10-CM | POA: Insufficient documentation

## 2021-04-18 DIAGNOSIS — J45909 Unspecified asthma, uncomplicated: Secondary | ICD-10-CM | POA: Diagnosis not present

## 2021-04-18 DIAGNOSIS — S0990XA Unspecified injury of head, initial encounter: Secondary | ICD-10-CM | POA: Diagnosis not present

## 2021-04-18 DIAGNOSIS — W01198A Fall on same level from slipping, tripping and stumbling with subsequent striking against other object, initial encounter: Secondary | ICD-10-CM | POA: Diagnosis not present

## 2021-04-18 DIAGNOSIS — D72829 Elevated white blood cell count, unspecified: Secondary | ICD-10-CM | POA: Diagnosis not present

## 2021-04-18 DIAGNOSIS — E119 Type 2 diabetes mellitus without complications: Secondary | ICD-10-CM | POA: Insufficient documentation

## 2021-04-18 DIAGNOSIS — Z79899 Other long term (current) drug therapy: Secondary | ICD-10-CM | POA: Insufficient documentation

## 2021-04-18 DIAGNOSIS — Y9301 Activity, walking, marching and hiking: Secondary | ICD-10-CM | POA: Diagnosis not present

## 2021-04-18 DIAGNOSIS — Z91040 Latex allergy status: Secondary | ICD-10-CM | POA: Diagnosis not present

## 2021-04-18 DIAGNOSIS — Z7984 Long term (current) use of oral hypoglycemic drugs: Secondary | ICD-10-CM | POA: Diagnosis not present

## 2021-04-18 DIAGNOSIS — S0101XA Laceration without foreign body of scalp, initial encounter: Secondary | ICD-10-CM | POA: Diagnosis not present

## 2021-04-18 DIAGNOSIS — I10 Essential (primary) hypertension: Secondary | ICD-10-CM | POA: Insufficient documentation

## 2021-04-18 LAB — BASIC METABOLIC PANEL
Anion gap: 9 (ref 5–15)
BUN: 30 mg/dL — ABNORMAL HIGH (ref 8–23)
CO2: 22 mmol/L (ref 22–32)
Calcium: 9.1 mg/dL (ref 8.9–10.3)
Chloride: 105 mmol/L (ref 98–111)
Creatinine, Ser: 1.55 mg/dL — ABNORMAL HIGH (ref 0.44–1.00)
GFR, Estimated: 35 mL/min — ABNORMAL LOW (ref >60)
Glucose, Bld: 294 mg/dL — ABNORMAL HIGH (ref 70–99)
Potassium: 3.7 mmol/L (ref 3.5–5.1)
Sodium: 136 mmol/L (ref 135–145)

## 2021-04-18 LAB — CBC WITH DIFFERENTIAL/PLATELET
Abs Immature Granulocytes: 0.05 10*3/uL (ref 0.00–0.07)
Basophils Absolute: 0.1 10*3/uL (ref 0.0–0.1)
Basophils Relative: 1 %
Eosinophils Absolute: 0.1 10*3/uL (ref 0.0–0.5)
Eosinophils Relative: 0 %
HCT: 36.7 % (ref 36.0–46.0)
Hemoglobin: 12 g/dL (ref 12.0–15.0)
Immature Granulocytes: 0 %
Lymphocytes Relative: 7 %
Lymphs Abs: 0.8 10*3/uL (ref 0.7–4.0)
MCH: 29.6 pg (ref 26.0–34.0)
MCHC: 32.7 g/dL (ref 30.0–36.0)
MCV: 90.6 fL (ref 80.0–100.0)
Monocytes Absolute: 0.5 10*3/uL (ref 0.1–1.0)
Monocytes Relative: 5 %
Neutro Abs: 10.3 10*3/uL — ABNORMAL HIGH (ref 1.7–7.7)
Neutrophils Relative %: 87 %
Platelets: 360 10*3/uL (ref 150–400)
RBC: 4.05 MIL/uL (ref 3.87–5.11)
RDW: 13.2 % (ref 11.5–15.5)
WBC: 11.9 10*3/uL — ABNORMAL HIGH (ref 4.0–10.5)
nRBC: 0 % (ref 0.0–0.2)

## 2021-04-18 LAB — SAMPLE TO BLOOD BANK

## 2021-04-18 MED ORDER — FENTANYL CITRATE PF 50 MCG/ML IJ SOSY
12.5000 ug | PREFILLED_SYRINGE | Freq: Once | INTRAMUSCULAR | Status: AC
  Administered 2021-04-18: 12.5 ug via INTRAVENOUS
  Filled 2021-04-18: qty 1

## 2021-04-18 MED ORDER — OXYCODONE HCL 5 MG PO TABS
5.0000 mg | ORAL_TABLET | ORAL | 0 refills | Status: DC | PRN
Start: 2021-04-18 — End: 2021-04-29

## 2021-04-18 MED ORDER — OXYCODONE HCL 5 MG PO TABS
5.0000 mg | ORAL_TABLET | Freq: Once | ORAL | Status: AC
  Administered 2021-04-18: 5 mg via ORAL
  Filled 2021-04-18: qty 1

## 2021-04-18 MED ORDER — LIDOCAINE-EPINEPHRINE (PF) 2 %-1:200000 IJ SOLN
INTRAMUSCULAR | Status: AC
  Administered 2021-04-18: 20 mL
  Filled 2021-04-18: qty 20

## 2021-04-18 MED ORDER — LIDOCAINE-EPINEPHRINE (PF) 2 %-1:200000 IJ SOLN
INTRAMUSCULAR | Status: AC
  Administered 2021-04-18: 10 mL
  Filled 2021-04-18: qty 20

## 2021-04-18 NOTE — ED Triage Notes (Signed)
Per EMS, PATIENT HAS LARGE LACERATION TO HEAD WITH UNCONTROLLED BLEEDING

## 2021-04-18 NOTE — Discharge Instructions (Signed)
Keep the area clean and bandaged.  You may clean with mild soap and water.  No alcohol or hydrogen peroxide.  I recommend that you keep pressure dressing in place for this evening.  Ice on and off for help with swelling and bruising.  No lifting or bending over for 3 to 4 days.  Suture removal in 7 to 8 days.  Please follow-up with your primary care provider for recheck.  Return to emergency department for any new or worsening symptoms.

## 2021-04-18 NOTE — ED Provider Notes (Signed)
[facility] EMERGENCY DEPARTMENT Provider Note   CSN: [id number] Arrival date & time: 04/18/21  1155     History  Chief Complaint  Patient presents with   [facility] Laceration    Kristen Mack is a 76 y.o. female.   Fall Associated symptoms include headaches.  Head Laceration Associated symptoms include headaches.      Kristen Mack is a 76 y.o. female with past medical history of fibromyalgia, hypertension, asthma, Mnire's disease, type 2 diabetes who presents to the Emergency Department for evaluation of laceration of the forehead and scalp.  Patient states that she lost her balance and fell forward striking her head on a door frame.  She denies loss of consciousness, dizziness, notes having large amount of bleeding that has not been controlled with pressure.  Denies any use of aspirin, NSAIDs or blood thinners.  States that she frequently loses her balance due to her Mnire's  Last Td is 2 years ago  Home Medications Prior to Admission medications   Medication Sig Start Date End Date Taking? Authorizing Provider  albuterol (VENTOLIN HFA) 108 (90 Base) MCG/ACT inhaler INHALE 2 PUFFS INTO LUNGS EVERY 6 HOURS AS NEEDED FOR SHORTNESS OF BREATH 10/07/20   Kathyrn Drown, MD  Ascorbic Acid (VITAMIN C) 1000 MG tablet Take 6,000 mg by mouth daily.     [provider]  Azelastine HCl 137 MCG/SPRAY SOLN USE 2 SPRAY(S) IN EACH NOSTRIL TWICE DAILY 08/27/20   Kathyrn Drown, MD  B Complex Vitamins (B COMPLEX 100 PO) Take 1 tablet by mouth daily.    [provider]  buPROPion (WELLBUTRIN XL) 300 MG 24 hr tablet Take 1 tablet (300 mg total) by mouth daily. 10/07/20   Kathyrn Drown, MD  Chromium 1000 MCG TABS Take 1,000 mcg by mouth daily.    [provider]  Coenzyme Q10 (COQ10) 200 MG CAPS Take 400 mg by mouth daily.    [provider]  Cyanocobalamin (B-12) 1000 MCG SUBL Place 1,000 mcg under the tongue daily.    [provider]  DIGESTIVE ENZYMES PO Take 1 tablet by mouth 3 (three) times daily.     [provider]  diltiazem (CARDIZEM CD) 120 MG 24 hr capsule Take 1 capsule by mouth once daily 03/31/21   O'Neal, Cassie Freer, MD  empagliflozin (JARDIANCE) 10 MG TABS tablet Take 1 tablet (10 mg total) by mouth daily before breakfast. 02/10/21   Kathyrn Drown, MD  glucose blood (ONETOUCH VERIO) test strip Test once daily 10/07/20   Sallee Lange A, MD  ipratropium (ATROVENT) 0.02 % nebulizer solution USE ONE VIAL IN NEBULIZER 4 TIMES DAILY AS NEEDED FOR SHORTNESS OF BREATH 02/18/18   Kathyrn Drown, MD  levothyroxine (EUTHYROX) 125 MCG tablet Take 1 tablet (125 mcg total) by mouth daily before breakfast. 02/23/21   Nida, Marella Chimes, MD  Lysine HCl 500 MG TABS Take 500 mg by mouth daily.    [provider]  Magnesium 250 MG TABS Take 250 mg by mouth 2 (two) times daily.    [provider]  meclizine (ANTIVERT) 25 MG tablet Take 25 mg by mouth 3 (three) times daily as needed for dizziness.    [provider]  montelukast (SINGULAIR) 10 MG tablet Take 1 tablet (10 mg total) by mouth at bedtime. 10/07/20   Kathyrn Drown, MD  nystatin ointment (MYCOSTATIN) Apply 1 application topically 2 (two) times daily. Patient not taking: Reported on  04/15/2021 11/14/19   Kathyrn Drown, MD  OVER THE COUNTER MEDICATION Take 1 Scoop by mouth 3 (three) times daily with meals. Fennel seeds    [provider]  OVER THE COUNTER MEDICATION Take 1 tablet by mouth at bedtime as needed (sleep). Sleep essentials    [provider]  rosuvastatin (CRESTOR) 5 MG tablet Take 1 tablet on Monday, Wednesday and Friday 10/07/20   Kathyrn Drown, MD  TART CHERRY PO Take 1 tablet by mouth in the morning, at noon, and at bedtime.    [provider]  tiZANidine (ZANAFLEX) 2 MG tablet 1 qhs prn caution drowsiness 02/10/21   Kathyrn Drown, MD  Vitamin A 3 MG (10000 UT) TABS Take  10,000 Units by mouth daily.    [provider]  vitamin E 180 MG (400 UNITS) capsule Take 800 Units by mouth daily.     [provider]      Allergies    Ambien [zolpidem tartrate], Cefzil [cefprozil], Codeine, Demerol [meperidine], Formaldehyde, Latex, Sulfa antibiotics, and Celexa [citalopram hydrobromide]    Review of Systems   Review of Systems  HENT:         Forehead/scalp laceration  Eyes:  Negative for visual disturbance.  Gastrointestinal:  Negative for nausea and vomiting.  Neurological:  Positive for headaches. Negative for dizziness.  All other systems reviewed and are negative.  Physical Exam Updated Vital Signs BP (!) 190/92 (BP Location: Right Arm)    Pulse 77    Temp 97.8 F (36.6 C)    Resp 16    Ht 4' 11"  (1.499 m)    Wt 100 kg    SpO2 95%    BMI 44.53 kg/m  Physical Exam Vitals and nursing note reviewed.  Constitutional:      General: She is not in acute distress.    Appearance: Normal appearance. She is not ill-appearing.  HENT:     Head:     Comments: Large, 10 cm laceration to forehead that extends into frontal scalp with active bleeding despite direct pressure.  Hematoma present Eyes:     Extraocular Movements: Extraocular movements intact.     Conjunctiva/sclera: Conjunctivae normal.  Cardiovascular:     Rate and Rhythm: Normal rate and regular rhythm.     Pulses: Normal pulses.  Pulmonary:     Effort: Pulmonary effort is normal.  Musculoskeletal:        General: Normal range of motion.     Cervical back: Normal range of motion. No tenderness.  Skin:    General: Skin is warm.     Capillary Refill: Capillary refill takes less than 2 seconds.  Neurological:     General: No focal deficit present.     Mental Status: She is alert.     Sensory: No sensory deficit.     Motor: No weakness.    ED Results / Procedures / Treatments   Labs (all labs ordered are listed, but only abnormal results are displayed) Labs Reviewed  BASIC  METABOLIC PANEL - Abnormal; Notable for the following components:      Result Value   Glucose, Bld 294 (*)    BUN 30 (*)    Creatinine, Ser 1.55 (*)    GFR, Estimated 35 (*)    All other components within normal limits  CBC WITH DIFFERENTIAL/PLATELET - Abnormal; Notable for the following components:   WBC 11.9 (*)    Neutro Abs 10.3 (*)    All other components within  normal limits  SAMPLE TO BLOOD BANK    EKG None  Radiology CT Head Wo Contrast  Result Date: 04/18/2021 CLINICAL DATA:  Fall.  Scalp laceration. EXAM: CT HEAD WITHOUT CONTRAST TECHNIQUE: Contiguous axial images were obtained from the base of the skull through the vertex without intravenous contrast. RADIATION DOSE REDUCTION: This exam was performed according to the departmental dose-optimization program which includes automated exposure control, adjustment of the mA and/or kV according to patient size and/or use of iterative reconstruction technique. COMPARISON:  None. FINDINGS: Brain: There is no evidence for acute hemorrhage, hydrocephalus, mass lesion, or abnormal extra-axial fluid collection. No definite CT evidence for acute infarction. Diffuse loss of parenchymal volume is consistent with atrophy. Patchy low attenuation in the deep hemispheric and periventricular white matter is nonspecific, but likely reflects chronic microvascular ischemic demyelination. Vascular: No hyperdense vessel or unexpected calcification. Skull: No evidence for fracture. No worrisome lytic or sclerotic lesion. Sinuses/Orbits: The visualized paranasal sinuses and mastoid air cells are clear. Visualized portions of the globes and intraorbital fat are unremarkable. Other: Right frontal scalp laceration noted. IMPRESSION: 1. No acute intracranial abnormality. 2. Right frontal scalp laceration. No underlying skull fracture. 3. Atrophy with chronic small vessel ischemic disease. Electronically Signed   By: Misty Stanley M.D.   On: 04/18/2021 15:15     Procedures .Marland KitchenLaceration Repair  Date/Time: 04/18/2021 4:27 PM Performed by: Kem Parkinson, PA-C Authorized by: Kem Parkinson, PA-C   Consent:    Consent obtained:  Verbal   Consent given by:  Patient   Risks discussed:  Infection, need for additional repair, pain, poor cosmetic result and poor wound healing   Alternatives discussed:  No treatment and delayed treatment Universal protocol:    Procedure explained and questions answered to patient or proxy's satisfaction: yes     Relevant documents present and verified: yes     Test results available: yes     Imaging studies available: yes     Required blood products, implants, devices, and special equipment available: yes     Site/side marked: yes     Immediately prior to procedure, a time out was called: yes     Patient identity confirmed:  Verbally with patient and arm band Anesthesia:    Anesthesia method:  Local infiltration   Local anesthetic:  Lidocaine 2% WITH epi Laceration details:    Location:  Face   Face location:  Forehead Pre-procedure details:    Preparation:  Patient was prepped and draped in usual sterile fashion Exploration:    Limited defect created (wound extended): no     Hemostasis achieved with:  Tied off vessels   Wound extent: vascular damage     Contaminated: no   Treatment:    Area cleansed with:  Saline   Amount of cleaning:  Standard   Irrigation solution:  Sterile saline   Irrigation method:  Syringe   Visualized foreign bodies/material removed: no     Debridement:  None   Scar revision: no     Layers/structures repaired:  Deep subcutaneous Deep subcutaneous:    Suture size:  4-0   Suture material:  Vicryl   Suture technique:  Simple interrupted   Number of sutures:  4 Skin repair:    Repair method:  Sutures   Suture size:  4-0   Suture material:  Prolene   Suture technique:  Running locked   Number of sutures:  18 Approximation:    Approximation:  Close Repair type:    Repair  type:  Intermediate Post-procedure details:    Dressing:  Non-adherent dressing   Procedure completion:  Tolerated well, no immediate complications Comments:     Deep cutaneous suturing performed by Dr. Tomi Bamberger    Medications Ordered in ED Medications  lidocaine-EPINEPHrine (XYLOCAINE W/EPI) 2 %-1:200000 (PF) injection (has no administration in time range)  lidocaine-EPINEPHrine (XYLOCAINE W/EPI) 2 %-1:200000 (PF) injection (has no administration in time range)  fentaNYL (SUBLIMAZE) injection 12.5 mcg (12.5 mcg Intravenous Given 04/18/21 1459)    ED Course/ Medical Decision Making/ A&P                           Medical Decision Making Amount and/or Complexity of Data Reviewed Labs: ordered. Radiology: ordered.  Risk Prescription drug management.   This patient presents to the ED for concern of bleeding from laceration to the forehead, this involves an extensive number of treatment options, and is a complaint that carries with it a high risk of complications and morbidity.  The differential diagnosis includes vascular injury, skull fracture, subdural hematoma    Additional history obtained:  Additional history obtained from patient's daughter was at bedside  Lab Tests:  I Ordered, and personally interpreted labs.  The pertinent results include: White count 11.9, hemoglobin reassuring at 12, electrolytes show a blood sugar of 294 kidney function BUN elevated, serum creatinine 1.55.  Patient has GFR of 35 which she states is improved from her baseline.   Imaging Studies ordered:  I ordered imaging studies including CT head I independently visualized and interpreted imaging which showed no intracranial abnormality I agree with the radiologist interpretation     Medicines ordered and prescription drug management:  I ordered medication including pain medication for headache Reevaluation of the patient after these medicines showed that the patient pain improved I have  reviewed the patients home medicines and have made adjustments as needed  Td is up-to-date per patient    Critical Interventions:  Hemostasis obtained after suturing    Reevaluation:  After the interventions noted above, I reevaluated the patient and found that they have : Improved  Patient has tolerated oral fluids without difficulty.  No vomiting.  Headache improved after pain medication. No further bleeding or complication after laceration repair.  On recheck of patient's blood pressure, manual blood pressure of 192/92.  Patient has not taken her evening antihypertensive medication. She has been observed in the department without complication.  She has tolerated oral fluids without difficulty.  No nausea or vomiting.    I feel that she is appropriate for discharge home at this time.  Wound care and head injury instructions discussed and she will follow-up closely with PCP.  Sutures out in 7 to 8 days. Strict return precautions discussed         Final Clinical Impression(s) / ED Diagnoses Final diagnoses:  Injury of head, initial encounter  Laceration of forehead, initial encounter    Rx / DC Orders ED Discharge Orders     None         Bufford Lope 04/18/21 1901    Dorie Rank, MD 04/20/21 1223

## 2021-04-18 NOTE — ED Triage Notes (Addendum)
Patient's daughter states she witnessed the patient lose balance due to her feet walking faster and hx of inner ear imbalance. Patient has head laceration to right scalp with bleeding in triage. Patient is alert and talking with staff. Dr. Tomi Bamberger and tammy, PA at bedside. Denies blood thinners.

## 2021-04-18 NOTE — ED Notes (Signed)
Trying to assess laceration to forehead. Pressure dressing with abd pad x2 and bulk gauze dressing. Dressing completely saturated. Tammy Triplett PA called to bedside to assess during dressing change. Patient noted to have large laceration with heavy active bleeding requiring manual pressure to be held. Dr Dorie Rank called to bedside with suture cart.

## 2021-04-20 ENCOUNTER — Encounter: Payer: Self-pay | Admitting: Family Medicine

## 2021-04-20 ENCOUNTER — Encounter (HOSPITAL_COMMUNITY): Payer: Self-pay | Admitting: Physician Assistant

## 2021-04-20 NOTE — Telephone Encounter (Signed)
Nurses-please have the front set her up for a follow-up visit on Monday 1 week from today to have sutures removed.  Also very important for family to do the best they can keeping the wound edges clean.  If necessary use some gentle peroxide compress to remove any dried blood.  Follow-up sooner if any problems.  I certainly hope Kristen Mack feels better soon if having problems as a week goes on we can recheck her later this week thanks-Dr. Nicki Reaper

## 2021-04-20 NOTE — Anesthesia Preprocedure Evaluation (Deleted)
Anesthesia Evaluation    Airway        Dental   Pulmonary former smoker,           Cardiovascular hypertension,      Neuro/Psych    GI/Hepatic   Endo/Other  diabetes  Renal/GU      Musculoskeletal   Abdominal   Peds  Hematology   Anesthesia Other Findings   Reproductive/Obstetrics                             Anesthesia Physical Anesthesia Plan  ASA:   Anesthesia Plan:    Post-op Pain Management:    Induction:   PONV Risk Score and Plan:   Airway Management Planned:   Additional Equipment:   Intra-op Plan:   Post-operative Plan:   Informed Consent:   Plan Discussed with:   Anesthesia Plan Comments: (PAT note by Karoline Caldwell, PA-C:  Follows with cardiology for history of PVCs (PVC burden 22%).  Echocardiogram showed normal LV function.  Her symptoms of palpitations were initially in the setting of iatrogenic hyperthyroidism and these improved with lowering dose of thyroid medication.  Last seen by Dr. Audie Box 04/06/2021 and upcoming surgery was discussed.  Per note, "Overall she is stable. She is okay to proceed with her planned procedure. We will see her back in 1 year."  OSA on CPAP.  DM2, last A1c 7.5 on 04/17/2021.  BMP 04/18/2021 reviewed, creatinine mildly elevated 1.55 (appears near baseline), glucose elevated at 294.  CBC 04/17/2021 reviewed, unremarkable.  Of note, patient was seen in ED 04/18/2021 for mechanical fall at home resulting in laceration to the forehead requiring sutures.  She denied LOC.  She notes frequent falls secondary to ear disease.  Patient's PCP Dr. Wolfgang Phoenix is aware and has arranged for patient to follow-up for suture removal.  EKG 04/06/2021: Sinus rhythm.  Rate 69.  PVCs.  Nonspecific ST-T wave changes.  Event monitor 02/14/2020: 1. Frequent PVCs of unifocal origin (22% burden).  2. No significant arrhythmias.    TTE 02/01/2020 1. Frequent ectopy  affects interpretation of LV systolic function. Left  ventricular ejection fraction, by estimation, is 55 to 60%. The left  ventricle has normal function. The left ventricle has no regional wall  motion abnormalities. There is moderate  left ventricular hypertrophy.  2. Right ventricular systolic function is normal. The right ventricular  size is normal.  3. Limited echo to evaluate LV function  )        Anesthesia Quick Evaluation

## 2021-04-20 NOTE — Progress Notes (Signed)
Anesthesia Chart Review:  Follows with cardiology for history of PVCs (PVC burden 22%).  Echocardiogram showed normal LV function.  Her symptoms of palpitations were initially in the setting of iatrogenic hyperthyroidism and these improved with lowering dose of thyroid medication.  Last seen by Dr. Audie Box 04/06/2021 and upcoming surgery was discussed.  Per note, "Overall she is stable.  She is okay to proceed with her planned procedure.  We will see her back in 1 year."  OSA on CPAP.  DM2, last A1c 7.5 on 04/17/2021.  BMP 04/18/2021 reviewed, creatinine mildly elevated 1.55 (appears near baseline), glucose elevated at 294.  CBC 04/17/2021 reviewed, unremarkable.  Of note, patient was seen in ED 04/18/2021 for mechanical fall at home resulting in laceration to the forehead requiring sutures.  She denied LOC.  She notes frequent falls secondary to ear disease.  Patient's PCP Dr. Wolfgang Phoenix is aware and has arranged for patient to follow-up for suture removal.  EKG 04/06/2021: Sinus rhythm.  Rate 69.  PVCs.  Nonspecific ST-T wave changes.  Event monitor 02/14/2020: 1. Frequent PVCs of unifocal origin (22% burden).  2. No significant arrhythmias.    TTE 02/01/2020  1. Frequent ectopy affects interpretation of LV systolic function. Left  ventricular ejection fraction, by estimation, is 55 to 60%. The left  ventricle has normal function. The left ventricle has no regional wall  motion abnormalities. There is moderate  left ventricular hypertrophy.   2. Right ventricular systolic function is normal. The right ventricular  size is normal.   3. Limited echo to evaluate LV function    Wynonia Musty [facility]/Anesthesiology Phone [phone] 04/20/2021 4:23 PM

## 2021-04-21 ENCOUNTER — Encounter: Payer: HMO | Admitting: Physical Therapy

## 2021-04-23 ENCOUNTER — Encounter (HOSPITAL_COMMUNITY): Admission: RE | Payer: Self-pay | Source: Home / Self Care

## 2021-04-23 ENCOUNTER — Ambulatory Visit (HOSPITAL_COMMUNITY): Admission: RE | Admit: 2021-04-23 | Payer: HMO | Source: Home / Self Care | Admitting: Obstetrics & Gynecology

## 2021-04-23 SURGERY — DILATATION & CURETTAGE/HYSTEROSCOPY WITH MYOSURE
Anesthesia: Choice

## 2021-04-27 ENCOUNTER — Ambulatory Visit: Payer: HMO | Admitting: Family Medicine

## 2021-04-28 ENCOUNTER — Ambulatory Visit: Payer: HMO | Admitting: Cardiovascular Disease

## 2021-04-28 DIAGNOSIS — E1122 Type 2 diabetes mellitus with diabetic chronic kidney disease: Secondary | ICD-10-CM | POA: Diagnosis not present

## 2021-04-28 DIAGNOSIS — M810 Age-related osteoporosis without current pathological fracture: Secondary | ICD-10-CM

## 2021-04-28 DIAGNOSIS — I10 Essential (primary) hypertension: Secondary | ICD-10-CM

## 2021-04-28 DIAGNOSIS — E785 Hyperlipidemia, unspecified: Secondary | ICD-10-CM | POA: Diagnosis not present

## 2021-04-28 DIAGNOSIS — N183 Chronic kidney disease, stage 3 unspecified: Secondary | ICD-10-CM

## 2021-04-28 DIAGNOSIS — E1169 Type 2 diabetes mellitus with other specified complication: Secondary | ICD-10-CM

## 2021-04-28 DIAGNOSIS — N184 Chronic kidney disease, stage 4 (severe): Secondary | ICD-10-CM

## 2021-04-28 DIAGNOSIS — I129 Hypertensive chronic kidney disease with stage 1 through stage 4 chronic kidney disease, or unspecified chronic kidney disease: Secondary | ICD-10-CM | POA: Diagnosis not present

## 2021-04-29 ENCOUNTER — Encounter: Payer: Self-pay | Admitting: Family Medicine

## 2021-04-29 ENCOUNTER — Other Ambulatory Visit: Payer: Self-pay

## 2021-04-29 ENCOUNTER — Ambulatory Visit (INDEPENDENT_AMBULATORY_CARE_PROVIDER_SITE_OTHER): Payer: HMO | Admitting: Family Medicine

## 2021-04-29 VITALS — BP 138/84 | Temp 96.8°F | Wt 211.0 lb

## 2021-04-29 DIAGNOSIS — E876 Hypokalemia: Secondary | ICD-10-CM

## 2021-04-29 DIAGNOSIS — I10 Essential (primary) hypertension: Secondary | ICD-10-CM

## 2021-04-29 DIAGNOSIS — R600 Localized edema: Secondary | ICD-10-CM

## 2021-04-29 MED ORDER — SITAGLIPTIN PHOSPHATE 50 MG PO TABS: ORAL_TABLET | ORAL | 5 refills | Status: DC

## 2021-04-29 NOTE — Progress Notes (Addendum)
° °  Subjective:    Patient ID: Kristen Mack, female    DOB: [birth year], 76 y.o.   MRN: [id number]  HPI Pt had trip 2 Saturdays ago and busted head. Pt has 18 stitches inside and 4 in the inside.  Bruised left hip that does not bother her. Pt states she may have "wrenched the right side of her back" but is just sore today. Pt has been off balance some and still having some pain in head.  Pt has finished oxycodone that was prescribed by hospital.   She still has intermittent headaches.  In addition there is some slight nausea at times and at times difficult time focusing and following through related to her head injury Laceration seems to be healing well Having some pedal edema  Also having sugars that are running in the low 200s Jardiance alone not keeping it under enough control Patient does not want to go on insulin   Review of Systems     Objective:   Physical Exam Long laceration on the forehead noted healing well Bruising noted on the forehead and around the eye Lungs are clear heart regular Blood pressure good Pedal edema  Suture removed without difficulty.  There is a deep suture that was sticking out we trimmed that at skin level not advisable to take it out no sign of infection     Assessment & Plan:  Diabetes subpar control add Januvia.  Patient not a good GLP-1 candidate Vania Rea is helping her but not getting her far enough Avoiding sulfonylureas Patient does not want to be on insulin currently  Recheck patient in 4 weeks to see how she is doing have family send [place] some glucose readings  Long laceration on the head seems to have healed well sutures removed without difficulty.  Very important for the patient to watch her balance uses a walker or a cane to get around-we did discuss how patients can have postconcussion syndrome and can have ongoing headaches with nausea.  We also discussed as she is getting older her balance is not as good I showed her some  exercises she could do.  Also encouraged her to utilize a cane or a walker on a regular basis.  Recheck in 4 weeks  Consideration for a diuretic but hold off until we see what her kidney functions are doing in regards to her pedal edema Diuretic came back Furosemide initiated Following up kidney function and magnesium in 2 weeks

## 2021-04-30 LAB — MAGNESIUM: Magnesium: 2 mg/dL (ref 1.6–2.3)

## 2021-04-30 LAB — BASIC METABOLIC PANEL
BUN/Creatinine Ratio: 16 (ref 12–28)
BUN: 26 mg/dL (ref 8–27)
CO2: 22 mmol/L (ref 20–29)
Calcium: 9.9 mg/dL (ref 8.7–10.3)
Chloride: 103 mmol/L (ref 96–106)
Creatinine, Ser: 1.66 mg/dL — ABNORMAL HIGH (ref 0.57–1.00)
Glucose: 253 mg/dL — ABNORMAL HIGH (ref 70–99)
Potassium: 4 mmol/L (ref 3.5–5.2)
Sodium: 139 mmol/L (ref 134–144)
eGFR: 32 mL/min/{1.73_m2} — ABNORMAL LOW (ref >59)

## 2021-05-04 ENCOUNTER — Telehealth: Payer: Self-pay | Admitting: Family Medicine

## 2021-05-04 MED ORDER — FUROSEMIDE 20 MG PO TABS: 20.0000 mg | ORAL_TABLET | Freq: Every day | ORAL | 1 refills | Status: DC

## 2021-05-04 NOTE — Telephone Encounter (Signed)
[phone] Autumn was going to call this pt back to let her know what she had called her about Thursday afternoon. Please call.

## 2021-05-04 NOTE — Addendum Note (Signed)
Addended by: Dairl Ponder on: 05/04/2021 03:02 PM   Modules accepted: Orders

## 2021-05-04 NOTE — Telephone Encounter (Signed)
Results were discussed with patient. See result note

## 2021-05-05 ENCOUNTER — Telehealth: Payer: Self-pay | Admitting: Family Medicine

## 2021-05-06 ENCOUNTER — Ambulatory Visit (HOSPITAL_COMMUNITY)
Admission: RE | Admit: 2021-05-06 | Discharge: 2021-05-06 | Disposition: A | Discharge status: Home / Self Care | Payer: HMO | Source: Ambulatory Visit | Attending: Family Medicine | Admitting: Family Medicine

## 2021-05-06 ENCOUNTER — Ambulatory Visit (INDEPENDENT_AMBULATORY_CARE_PROVIDER_SITE_OTHER): Payer: HMO | Admitting: Family Medicine

## 2021-05-06 ENCOUNTER — Other Ambulatory Visit: Payer: Self-pay

## 2021-05-06 DIAGNOSIS — M79672 Pain in left foot: Secondary | ICD-10-CM | POA: Diagnosis not present

## 2021-05-06 DIAGNOSIS — R296 Repeated falls: Secondary | ICD-10-CM | POA: Diagnosis not present

## 2021-05-06 NOTE — Progress Notes (Signed)
° °  Subjective:    Patient ID: Kristen Mack, female    DOB: Aug 02, 1945, 76 y.o.   MRN: [id number]  HPI  Patient arrives with left foot pain s/p fall. Patient states her foot got hung where her toes were bent back- still can move her toes and foot and ankle but it is sore. Patient has been using ace wrap with elevation and cool compresses- had some oxycodone at home and took one when it happened and one 4 hours later.   Patient states she has been having trouble with her balance and hearing since hitting her head a few weeks ago. Review of Systems     Objective:   Physical Exam Lungs clear heart regular swollen foot tender foot bruising noted left foot ankle normal       Assessment & Plan:  X-ray negative Postop shoe for the next 7 days Physical therapy for strengthening of the legs and frequent falls with gait training Follow-up if progressive troubles

## 2021-05-07 NOTE — Addendum Note (Signed)
Addended by: Dairl Ponder on: 05/07/2021 09:03 AM   Modules accepted: Orders

## 2021-05-13 ENCOUNTER — Ambulatory Visit: Payer: HMO | Admitting: Family Medicine

## 2021-05-15 ENCOUNTER — Encounter: Payer: Self-pay | Admitting: Family Medicine

## 2021-05-15 NOTE — Telephone Encounter (Signed)
May stop wearing postop shoe at this point Hopefully over the weekend does better but If any ongoing troubles recommend follow-up office visit next week

## 2021-05-19 DIAGNOSIS — I10 Essential (primary) hypertension: Secondary | ICD-10-CM | POA: Diagnosis not present

## 2021-05-19 DIAGNOSIS — E038 Other specified hypothyroidism: Secondary | ICD-10-CM | POA: Diagnosis not present

## 2021-05-19 DIAGNOSIS — R600 Localized edema: Secondary | ICD-10-CM | POA: Diagnosis not present

## 2021-05-20 LAB — BASIC METABOLIC PANEL
BUN/Creatinine Ratio: 13 (ref 12–28)
BUN: 23 mg/dL (ref 8–27)
CO2: 20 mmol/L (ref 20–29)
Calcium: 9.9 mg/dL (ref 8.7–10.3)
Chloride: 105 mmol/L (ref 96–106)
Creatinine, Ser: 1.81 mg/dL — ABNORMAL HIGH (ref 0.57–1.00)
Glucose: 229 mg/dL — ABNORMAL HIGH (ref 70–99)
Potassium: 3.8 mmol/L (ref 3.5–5.2)
Sodium: 143 mmol/L (ref 134–144)
eGFR: 28 mL/min/{1.73_m2} — ABNORMAL LOW (ref >59)

## 2021-05-20 LAB — T4, FREE: Free T4: 1.29 ng/dL (ref 0.82–1.77)

## 2021-05-20 LAB — TSH: TSH: 1.78 u[IU]/mL (ref 0.450–4.500)

## 2021-05-21 DIAGNOSIS — E119 Type 2 diabetes mellitus without complications: Secondary | ICD-10-CM | POA: Diagnosis not present

## 2021-05-21 DIAGNOSIS — H2513 Age-related nuclear cataract, bilateral: Secondary | ICD-10-CM | POA: Diagnosis not present

## 2021-05-21 LAB — HM DIABETES EYE EXAM

## 2021-05-28 ENCOUNTER — Ambulatory Visit: Payer: HMO | Admitting: "Endocrinology

## 2021-05-28 ENCOUNTER — Other Ambulatory Visit: Payer: Self-pay | Admitting: "Endocrinology

## 2021-05-29 ENCOUNTER — Ambulatory Visit: Payer: HMO | Admitting: Family Medicine

## 2021-06-02 ENCOUNTER — Other Ambulatory Visit: Payer: Self-pay | Admitting: Obstetrics & Gynecology

## 2021-06-03 ENCOUNTER — Other Ambulatory Visit: Payer: Self-pay

## 2021-06-03 ENCOUNTER — Encounter: Payer: Self-pay | Admitting: "Endocrinology

## 2021-06-03 ENCOUNTER — Ambulatory Visit: Payer: HMO | Admitting: "Endocrinology

## 2021-06-03 ENCOUNTER — Telehealth: Payer: Self-pay | Admitting: "Endocrinology

## 2021-06-03 VITALS — BP 144/88 | HR 64 | Ht 59.0 in | Wt 220.4 lb

## 2021-06-03 DIAGNOSIS — E038 Other specified hypothyroidism: Secondary | ICD-10-CM

## 2021-06-03 DIAGNOSIS — E782 Mixed hyperlipidemia: Secondary | ICD-10-CM

## 2021-06-03 DIAGNOSIS — E119 Type 2 diabetes mellitus without complications: Secondary | ICD-10-CM | POA: Diagnosis not present

## 2021-06-03 DIAGNOSIS — D3502 Benign neoplasm of left adrenal gland: Secondary | ICD-10-CM | POA: Diagnosis not present

## 2021-06-03 NOTE — Telephone Encounter (Signed)
pt daughter will call back and sch a 6 month follow up from today--and if her isnurance covers she will come back sooner for nurse visit for prolia ?

## 2021-06-03 NOTE — Progress Notes (Signed)
06/03/2021, 4:09 PM     Endocrinology follow-up note   Subjective:    Patient ID: Kristen Mack, female    DOB: [birth year], PCP Kathyrn Drown, MD   Past Medical History:  Diagnosis Date   ADHD (attention deficit hyperactivity disorder) 2007   Allergy 1964   Arthritis    Asthma    Cataract    CFS (chronic fatigue syndrome)    Chronic kidney disease 2021   Cracked tooth    X2  RIGHT LOWER MOLAR AND LEFT UPPER MOLAR   Depression    Diabetes mellitus without complication ([place])    Fatty liver disease, nonalcoholic    Fibromyalgia    GERD (gastroesophageal reflux disease)    History of adenomatous polyp of colon    History of cardiac murmur    until age 75- pt has had echocardiograms   History of hiatal hernia 2021   small hiatal hernia was seen on scan September 2021   History of TIA (transient ischemic attack)    per CT scan    Hyperlipidemia    Hypertension    Hypothyroidism    IBS (irritable bowel syndrome)    Meniere disease    OSA (obstructive sleep apnea) 07/11/2017   pt uses CPAP   Osteopenia    PMB (postmenopausal bleeding)    PVC's (premature ventricular contractions)    Stroke ([place])    Tooth loose    LOWER FRONT   Past Surgical History:  Procedure Laterality Date   [place]   and  REPAIR UMBILICAL HERNIA   COLONOSCOPY N/A 04/19/2013   Procedure: COLONOSCOPY;  Surgeon: Rogene Houston, MD;  [facility];  Service: Endoscopy;  Laterality: N/A;     ESOPHAGOGASTRODUODENOSCOPY  02/08/2003   HERNIA REPAIR  1995   HYSTEROSCOPY WITH D & C  11/14/2002   endometrial polypectomy   HYSTEROSCOPY WITH D & C N/A 04/05/2014   Procedure: DILATATION AND CURETTAGE /HYSTEROSCOPY, ENDOMETRIAL POLYPECTOMY;  Surgeon: Sanjuana Kava, MD;  Location: [facility];  Service: Gynecology;  Laterality: N/A;   LIVER BIOPSY  01/29/2004   benign   TUBAL  LIGATION  1979   Social History   Socioeconomic History   Marital status: Married    Spouse name: Herbie Baltimore   Number of children: 2   Years of education: Not on file   Highest education level: Not on file  Occupational History   Occupation: retired, Therapist, sports  Tobacco Use   Smoking status: Former    Packs/day: 1.50    Years: 3.00    Pack years: 4.50    Types: Cigarettes    Quit date: 10/03/1968    Years since quitting: 52.7   Smokeless tobacco: Never  Vaping Use   Vaping Use: Never used  Substance and Sexual Activity   Alcohol use: No   Drug use: No   Sexual activity: Not Currently    Birth control/protection: None    Comment: Husband has ED  Other Topics Concern   Not on file  Social History Narrative   2 daughters, Amy and Sharyn Lull. Amy lives with parents. Sharyn Lull lives in [place].   Social  Determinants of Health   Financial Resource Strain: Low Risk    Difficulty of Paying Living Expenses: Not hard at all  Food Insecurity: No Food Insecurity   Worried About [place] in the Last Year: Never true   Ran Out of Food in the Last Year: Never true  Transportation Needs: No Transportation Needs   Lack of Transportation (Medical): No   Lack of Transportation (Non-Medical): No  Physical Activity: Insufficiently Active   Days of Exercise per Week: 3 days   Minutes of Exercise per Session: 20 min  Stress: No Stress Concern Present   Feeling of Stress : Not at all  Social Connections: Socially Integrated   Frequency of Communication with Friends and Family: More than three times a week   Frequency of Social Gatherings with Friends and Family: More than three times a week   Attends Religious Services: 1 to 4 times per year   Active Member of [employer] or Organizations: Yes   Attends Archivist Meetings: 1 to 4 times per year   Marital Status: Married   Outpatient Encounter Medications as of 06/03/2021  Medication Sig   albuterol (VENTOLIN HFA) 108 (90 Base)  MCG/ACT inhaler INHALE 2 PUFFS INTO LUNGS EVERY 6 HOURS AS NEEDED FOR SHORTNESS OF BREATH   Ascorbic Acid (VITAMIN C) 1000 MG tablet Take 6,000 mg by mouth daily.    Azelastine HCl 137 MCG/SPRAY SOLN USE 2 SPRAY(S) IN EACH NOSTRIL TWICE DAILY   B Complex Vitamins (B COMPLEX 100 PO) Take 1 tablet by mouth daily.   buPROPion (WELLBUTRIN XL) 300 MG 24 hr tablet Take 1 tablet (300 mg total) by mouth daily.   Chromium 1000 MCG TABS Take 1,000 mcg by mouth daily.   Coenzyme Q10 (COQ10) 200 MG CAPS Take 400 mg by mouth daily.   Cyanocobalamin (B-12) 1000 MCG SUBL Place 1,000 mcg under the tongue daily.   DIGESTIVE ENZYMES PO Take 1 tablet by mouth 3 (three) times daily.    diltiazem (CARDIZEM CD) 120 MG 24 hr capsule Take 1 capsule by mouth once daily   empagliflozin (JARDIANCE) 10 MG TABS tablet Take 1 tablet (10 mg total) by mouth daily before breakfast.   furosemide (LASIX) 20 MG tablet Take 1 tablet (20 mg total) by mouth daily.   glucose blood (ONETOUCH VERIO) test strip Test once daily   ipratropium (ATROVENT) 0.02 % nebulizer solution USE ONE VIAL IN NEBULIZER 4 TIMES DAILY AS NEEDED FOR SHORTNESS OF BREATH   levothyroxine (EUTHYROX) 125 MCG tablet Take 1 tablet (125 mcg total) by mouth daily before breakfast.   Lysine HCl 500 MG TABS Take 500 mg by mouth daily.   Magnesium 250 MG TABS Take 250 mg by mouth 2 (two) times daily.   meclizine (ANTIVERT) 25 MG tablet Take 25 mg by mouth 3 (three) times daily as needed for dizziness.   montelukast (SINGULAIR) 10 MG tablet Take 1 tablet (10 mg total) by mouth at bedtime.   nystatin ointment (MYCOSTATIN) Apply 1 application topically 2 (two) times daily. (Patient not taking: Reported on 06/03/2021)   OVER THE COUNTER MEDICATION Take 1 Scoop by mouth 3 (three) times daily with meals. Fennel seeds   OVER THE COUNTER MEDICATION Take 1 tablet by mouth at bedtime as needed (sleep). Sleep essentials   rosuvastatin (CRESTOR) 5 MG tablet Take 1 tablet on Monday,  Wednesday and Friday   sitaGLIPtin (JANUVIA) 50 MG tablet Take one tablet po daily   TART CHERRY PO Take  1 tablet by mouth in the morning, at noon, and at bedtime.   tiZANidine (ZANAFLEX) 2 MG tablet 1 qhs prn caution drowsiness   Vitamin A 3 MG (10000 UT) TABS Take 10,000 Units by mouth daily.   vitamin E 180 MG (400 UNITS) capsule Take 800 Units by mouth daily.    No facility-administered encounter medications on file as of 06/03/2021.   ALLERGIES: Allergies  Allergen Reactions   Ambien [Zolpidem Tartrate] Other (See Comments)    Side effect "lost a couple days"; memory loss   Cefzil [Cefprozil] Diarrhea   Codeine Itching and Nausea And Vomiting   Demerol [Meperidine] Other (See Comments)    "messes up senses"   Formaldehyde    Latex Other (See Comments)    "skin gets raw"   Sulfa Antibiotics Other (See Comments)    Severe abd cramp   Celexa [Citalopram Hydrobromide] Other (See Comments)    Mouth sores    VACCINATION STATUS: Immunization History  Administered Date(s) Administered   Fluad Quad(high Dose 65+) 01/22/2020, 02/10/2021   Influenza, High Dose Seasonal PF 04/15/2018, 01/13/2019   Influenza,inj,Quad PF,6+ Mos 03/02/2016, 03/03/2017   Influenza-Unspecified 04/15/2018, 01/15/2019   Moderna Sars-Covid-2 Vaccination 05/23/2019, 06/19/2019, 03/05/2020   Pneumococcal Conjugate-13 05/22/2014   Pneumococcal Polysaccharide-23 03/03/2017   Tdap 04/18/2020   Zoster Recombinat (Shingrix) 12/08/2018, 04/27/2019    HPI REGLA FITZGIBBON is 76 y.o. female who is accompanied by her daughter to clinic.  She usually follows in this clinic for stable, nonfunctioning 1.5 cm left adrenal adenoma which was previously worked up completely.  She is also in follow up for hypothyroidism, type 2 DM, osteoporosis. She is on LT4 125 mcg po QAM, her previsit labs are c/w appropriate replacement. In the interval , she was given Jardiance 10 mg qday by her PMD, while lowering her Januvia to  50 mg po qam. Her recent a1cwas 7.5%.  She has cold intolerance as a complaint. -In April 2022,  she underwent surveillance MRI abdomen/pelvis which documented stable adrenal finding.  This study was done to follow-up pancreatic cyst which was also reported to be stable and favoring benign findings.   -Her  previous 24-hour urine function studies for adrenal function have been within normal limits .  -She denies history of difficult to control hypertension, in fact has history of orthostatic hypotension.  She denies spells of palpitations, headaches, sweating. -She denies close  family history of pituitary, adrenal dysfunctions.  Review of Systems  Limited as above.  Objective:    BP (!) 144/88    Pulse 64    Ht 4' 11"  (1.499 m)    Wt 220 lb 6.4 oz (100 kg)    BMI 44.52 kg/m   Wt Readings from Last 3 Encounters:  06/03/21 220 lb 6.4 oz (100 kg)  04/29/21 211 lb (95.7 kg)  04/18/21 220 lb 7.4 oz (100 kg)    Recent Results (from the past 2160 hour(s))  Basic Metabolic Panel (BMET)     Status: Abnormal   Collection Time: 03/19/21  2:25 PM  Result Value Ref Range   Glucose 168 (H) 70 - 99 mg/dL   BUN 27 8 - 27 mg/dL   Creatinine, Ser 1.51 (H) 0.57 - 1.00 mg/dL   eGFR 36 (L) >59 mL/min/1.73   BUN/Creatinine Ratio 18 12 - 28   Sodium 141 134 - 144 mmol/L   Potassium 3.9 3.5 - 5.2 mmol/L   Chloride 104 96 - 106 mmol/L   CO2 19 (L) 20 -  29 mmol/L   Calcium 9.7 8.7 - 10.3 mg/dL  Glucose, capillary     Status: Abnormal   Collection Time: 04/17/21 10:00 AM  Result Value Ref Range   Glucose-Capillary 200 (H) 70 - 99 mg/dL    Comment: Glucose reference range applies only to samples taken after fasting for at least 8 hours.  CBC     Status: None   Collection Time: 04/17/21 10:44 AM  Result Value Ref Range   WBC 9.2 4.0 - 10.5 K/uL   RBC 4.51 3.87 - 5.11 MIL/uL   Hemoglobin 13.0 12.0 - 15.0 g/dL   HCT 40.1 36.0 - 46.0 %   MCV 88.9 80.0 - 100.0 fL   MCH 28.8 26.0 - 34.0 pg   MCHC  32.4 30.0 - 36.0 g/dL   RDW 13.2 11.5 - 15.5 %   Platelets 379 150 - 400 K/uL   nRBC 0.0 0.0 - 0.2 %    Comment: Performed at [facility], [place] [address] [place], [place] [address]  Basic metabolic panel     Status: Abnormal   Collection Time: 04/17/21 10:44 AM  Result Value Ref Range   Sodium 138 135 - 145 mmol/L   Potassium 3.1 (L) 3.5 - 5.1 mmol/L   Chloride 105 98 - 111 mmol/L   CO2 22 22 - 32 mmol/L   Glucose, Bld 207 (H) 70 - 99 mg/dL    Comment: Glucose reference range applies only to samples taken after fasting for at least 8 hours.   BUN 24 (H) 8 - 23 mg/dL   Creatinine, Ser 1.75 (H) 0.44 - 1.00 mg/dL   Calcium 9.4 8.9 - 10.3 mg/dL   GFR, Estimated 30 (L) >60 mL/min    Comment: (NOTE) Calculated using the CKD-EPI Creatinine Equation (2021)    Anion gap 11 5 - 15    Comment: Performed at [facility] [address], [place], [place] [address]  Hemoglobin A1c per protocol     Status: Abnormal   Collection Time: 04/17/21 10:44 AM  Result Value Ref Range   Hgb A1c MFr Bld 7.5 (H) 4.8 - 5.6 %    Comment: (NOTE)         Prediabetes: 5.7 - 6.4         Diabetes: >6.4         Glycemic control for adults with diabetes: <7.0    Mean Plasma Glucose 169 mg/dL    Comment: (NOTE) Performed At: [facility] [address], [place] [id number] Rush Farmer MD FU:[id number]   Type and screen     Status: None   Collection Time: 04/17/21 10:55 AM  Result Value Ref Range   ABO/RH(D) A POS    Antibody Screen NEG    Sample Expiration 05/01/2021,2359    Extend sample reason      NO TRANSFUSIONS OR PREGNANCY IN THE PAST 3 MONTHS Performed at [facility], [address], [place], [place] [address]   Basic metabolic panel     Status: Abnormal   Collection Time: 04/18/21  2:36 PM  Result Value Ref Range   Sodium 136 135 - 145 mmol/L   Potassium 3.7 3.5 - 5.1 mmol/L   Chloride 105 98 - 111 mmol/L   CO2 22 22 - 32 mmol/L   Glucose, Bld 294 (H)  70 - 99 mg/dL    Comment: Glucose reference range applies only to samples taken after fasting for at least 8 hours.  BUN 30 (H) 8 - 23 mg/dL   Creatinine, Ser 1.55 (H) 0.44 - 1.00 mg/dL   Calcium 9.1 8.9 - 10.3 mg/dL   GFR, Estimated 35 (L) >60 mL/min    Comment: (NOTE) Calculated using the CKD-EPI Creatinine Equation (2021)    Anion gap 9 5 - 15    Comment: Performed at [facility], [address], [place], [place] [address]  CBC with Differential     Status: Abnormal   Collection Time: 04/18/21  2:36 PM  Result Value Ref Range   WBC 11.9 (H) 4.0 - 10.5 K/uL   RBC 4.05 3.87 - 5.11 MIL/uL   Hemoglobin 12.0 12.0 - 15.0 g/dL   HCT 36.7 36.0 - 46.0 %   MCV 90.6 80.0 - 100.0 fL   MCH 29.6 26.0 - 34.0 pg   MCHC 32.7 30.0 - 36.0 g/dL   RDW 13.2 11.5 - 15.5 %   Platelets 360 150 - 400 K/uL   nRBC 0.0 0.0 - 0.2 %   Neutrophils Relative % 87 %   Neutro Abs 10.3 (H) 1.7 - 7.7 K/uL   Lymphocytes Relative 7 %   Lymphs Abs 0.8 0.7 - 4.0 K/uL   Monocytes Relative 5 %   Monocytes Absolute 0.5 0.1 - 1.0 K/uL   Eosinophils Relative 0 %   Eosinophils Absolute 0.1 0.0 - 0.5 K/uL   Basophils Relative 1 %   Basophils Absolute 0.1 0.0 - 0.1 K/uL   Immature Granulocytes 0 %   Abs Immature Granulocytes 0.05 0.00 - 0.07 K/uL    Comment: Performed at [facility], [address] [place], [place] [address]  Sample to Blood Bank     Status: None   Collection Time: 04/18/21  2:42 PM  Result Value Ref Range   Blood Bank Specimen SAMPLE AVAILABLE FOR TESTING    Sample Expiration      04/21/2021,2359 Performed at [facility], [address], [place], [place] [address]   Basic Metabolic Panel (BMET)     Status: Abnormal   Collection Time: 04/29/21  3:34 PM  Result Value Ref Range   Glucose 253 (H) 70 - 99 mg/dL   BUN 26 8 - 27 mg/dL   Creatinine, Ser 1.66 (H) 0.57 - 1.00 mg/dL   eGFR 32 (L) >59 mL/min/1.73   BUN/Creatinine Ratio 16 12 - 28   Sodium 139 134 - 144 mmol/L   Potassium 4.0 3.5 - 5.2  mmol/L   Chloride 103 96 - 106 mmol/L   CO2 22 20 - 29 mmol/L   Calcium 9.9 8.7 - 10.3 mg/dL  Magnesium     Status: None   Collection Time: 04/29/21  3:34 PM  Result Value Ref Range   Magnesium 2.0 1.6 - 2.3 mg/dL  Basic metabolic panel     Status: Abnormal   Collection Time: 05/19/21  3:35 PM  Result Value Ref Range   Glucose 229 (H) 70 - 99 mg/dL   BUN 23 8 - 27 mg/dL   Creatinine, Ser 1.81 (H) 0.57 - 1.00 mg/dL   eGFR 28 (L) >59 mL/min/1.73   BUN/Creatinine Ratio 13 12 - 28   Sodium 143 134 - 144 mmol/L   Potassium 3.8 3.5 - 5.2 mmol/L   Chloride 105 96 - 106 mmol/L   CO2 20 20 - 29 mmol/L   Calcium 9.9 8.7 - 10.3 mg/dL  TSH     Status: None   Collection Time: 05/19/21  3:37 PM  Result Value Ref Range  TSH 1.780 0.450 - 4.500 uIU/mL  T4, free     Status: None   Collection Time: 05/19/21  3:37 PM  Result Value Ref Range   Free T4 1.29 0.82 - 1.77 ng/dL    Diabetic Labs (most recent): Lab Results  Component Value Date   HGBA1C 7.5 (H) 04/17/2021   HGBA1C 7.1 (A) 01/21/2021   HGBA1C 7.0 (H) 10/02/2020     Lipid Panel ( most recent) Lipid Panel     Component Value Date/Time   CHOL 160 02/06/2021 1150   TRIG 175 (H) 02/06/2021 1150   HDL 47 02/06/2021 1150   CHOLHDL 3.4 02/06/2021 1150   CHOLHDL 3.3 05/07/2014 1122   VLDL 24 05/07/2014 1122   LDLCALC 83 02/06/2021 1150          MRI of abdomen on May 04, 2017:  Normal right adrenal gland. Left adrenal nodule on the order of 1.5 cm is consistent with an adenoma.   Surveillance abdominal MRI from December 25, 2019 IMPRESSION: 1. Motion degraded exam shows multiple tiny cystic lesions in the pancreas. Dominant lesion has minimally increased from 14 mm to 17 mm in the 2.5 year interval since previous MRI. No gross soft tissue component or abnormal enhancement evident on today's markedly motion degraded exam. At 17 mm, consensus guidelines recommend repeat imaging every 6 months for 2 years to ensure  stability. This recommendation follows ACR consensus guidelines: Management of Incidental Pancreatic Cysts: A White Paper of the ACR Incidental Findings Committee. [place] 1610;96:045-409. 2. Additional tiny 6 mm cystic foci in the body/tail region are new. Attention on follow-up recommended. 3. Abnormal fluid in the endometrial cavity of the uterus. Pelvic ultrasound for further evaluation. 4. Stable left adrenal adenoma. 5. Small hiatal hernia.   MRI of abdomen/pelvis on July 15, 2020 IMPRESSION: Motion degraded images.   16 mm unilocular pancreatic cyst, unchanged. This favors a benign pseudocyst or side branch IPMN and is of questionable clinical significance. Consider follow-up MR or CT abdomen with/without contrast in 1 year, as clinically warranted. (MR is generally favored, but CT may be preferred given motion concerns).   Suspected endometrial soft tissue/thickening, incompletely visualized/evaluated. This continues to raise concern for early endometrial neoplasm. GYN consultation is suggested.   These results will be called to the ordering clinician or representative by the Radiologist Assistant, and communication documented in the PACS or [employer].  Assessment & Plan:   1.  Type 2 diabetes- -Well-controlled, with recent  A1c of 7.5%.   She is advised to continue on her 2 medications including Januvia 38m po qam and cautious utility of Jardiance 10 mg po qam at breakfast. Side effects and precautions about JVania Reawas discussed and patients expresses that she was well informed about this medication by her PMD .  We discussed on lifestyle interventions including [employer] plant-based lifestyle nutrition with less processed carbs and less animal products. She will not need insulin treatment for now.  She will continue to monitor blood glucose at least at fasting. - she acknowledges that there is a room for improvement in her food and drink  choices. - Suggestion is made for her to avoid simple carbohydrates  from her diet including Cakes, Sweet Desserts, Ice Cream, Soda (diet and regular), Sweet Tea, Candies, Chips, Cookies, Store Bought Juices, Alcohol in Excess of  1-2 drinks a day, Artificial Sweeteners,  Coffee Creamer, and "Sugar-free" Products, Lemonade. This will help patient to have more stable blood glucose profile and  potentially avoid unintended weight gain.    2.  Adenoma of left adrenal gland I reviewed her interval labs, imaging studies with her and her daughter in the exam room.    The left adrenal gland is reported to have mild thickening, right adrenal gland is normal.    -Her previsit plasma metanephrines are within normal limits.   -She has previously undergone 24-hour urine studies for catecholamines, metanephrines, cortisol, and aldosterone, which are all within normal limits.  -This is consistent with nonfunctioning left adrenal adenoma of 1.5 cm, stable on imaging studies.   -She will not require any specific treatment at this time. -Related to her pancreatic cysts/pseudocysts she is scheduled to have MRI every 6 months, will use those  imaging studies to follow-up on the adrenal adenoma.      3.  Hypothyroidism  Her levothyroxine was adjusted at 125 mcg with TFTs c/w appropriate replacement. Even though she insists that her dose needs to be increased, I advised her to stay on same dose for safety reasons and dose adjustment is only made on the basis of lab values and not by symptoms.   - We discussed about the correct intake of her thyroid hormone, on empty stomach at fasting, with water, separated by at least 30 minutes from breakfast and other medications,  and separated by more than 4 hours from calcium, iron, multivitamins, acid reflux medications (PPIs). -Patient is made aware of the fact that thyroid hormone replacement is needed for life, dose to be adjusted by periodic monitoring of thyroid function  tests.  For Osteoporosis: she was previously treated with IV reclast but wishes to avoid it. She is asking for Prolia . Prolia is a good choice  for her , will verify with insurance and if covered , she will return for Barry injection of Prolia in the coming days - weeks.    - I advised patient to maintain close follow up with Kathyrn Drown, MD for primary care needs.   I spent 33 minutes in the care of the patient today including review of labs from Thyroid Function, CMP, and other relevant labs ; imaging/biopsy records (current and previous including abstractions from other facilities); face-to-face time discussing  her lab results and symptoms, medications doses, her options of short and long term treatment based on the latest standards of care / guidelines;   and documenting the encounter.  Kristen Mack  participated in the discussions, expressed understanding, and voiced agreement with the above plans.  All questions were answered to her satisfaction. she is encouraged to contact clinic should she have any questions or concerns prior to her return visit.    Follow up plan: Return in about 6 months (around 12/04/2021) for F/U with Pre-visit Labs.   Glade Lloyd, MD [facility] [address] [place], [place] [address] Phone: [phone]  Fax: [phone]     06/03/2021, 4:09 PM  This note was partially dictated with voice recognition software. Similar sounding words can be transcribed inadequately or may not  be corrected upon review.

## 2021-06-04 ENCOUNTER — Encounter: Payer: Self-pay | Admitting: Family Medicine

## 2021-06-04 ENCOUNTER — Ambulatory Visit (INDEPENDENT_AMBULATORY_CARE_PROVIDER_SITE_OTHER): Payer: HMO | Admitting: Family Medicine

## 2021-06-04 DIAGNOSIS — M7062 Trochanteric bursitis, left hip: Secondary | ICD-10-CM

## 2021-06-04 MED ORDER — TRAMADOL HCL 50 MG PO TABS: 50.0000 mg | ORAL_TABLET | Freq: Two times a day (BID) | ORAL | 0 refills | Status: DC | PRN

## 2021-06-04 NOTE — Patient Instructions (Signed)
Recommend using ice and heat. ? ?Medication as prescribed. ? ?Follow up with Dr. Wolfgang Phoenix in the near future. ? ?Take care ? ?Dr. Lacinda Axon  ?

## 2021-06-05 DIAGNOSIS — M7060 Trochanteric bursitis, unspecified hip: Secondary | ICD-10-CM | POA: Insufficient documentation

## 2021-06-05 NOTE — Assessment & Plan Note (Signed)
Cannot use NSAIDs in the setting of chronic kidney disease.  Discussed prednisone and steroid injection and patient declined due to elevated blood sugars as well as prior side effects from corticosteroids.  Tramadol as needed for pain.  Supportive care.  Follow-up with PCP. ?

## 2021-06-05 NOTE — Progress Notes (Signed)
? ?Subjective:  ?Patient ID: Kristen Mack, female    DOB: 03-29-1946  Age: 76 y.o. MRN: [id number] ? ?CC: ?Chief Complaint  ?Patient presents with  ? Sciatica  ?  Sharp pain at times that would cause dizziness. Pt was taking Meloxicam and muscle relaxer Zanaflex. Last night, left hip joint began to be painful. Unable to get comfortable. Nauseated this morning and was unable to touch hip joint.   ? ? ?HPI: ? ?76 year old female presents for evaluation of the above. ? ?Patient states that over the past 1.5 weeks she has had worsening issues with dizziness and associated nausea.  This is chronic.  Patient also reports recent trouble with sciatica.  She states that this started on Sunday.  Most recently had significant pain over the lateral hip.  She states that she has used a muscle relaxer and some leftover pain medication without resolution.  Pain has been severe.  She states that it is better today.  However, she is concerned.  No recent fall, trauma, injury.  Patient reports that she uses meclizine for dizziness.  This is an ongoing issue for which she is followed by ENT. ? ?Patient Active Problem List  ? Diagnosis Date Noted  ? Trochanteric bursitis 06/05/2021  ? Bruising tendency 06/30/2020  ? Mixed hyperlipidemia 02/13/2020  ? Diabetes mellitus without complication ([place]) 56/15/3794  ? Sleep apnea 07/11/2017  ? Pancreatic pseudocyst 05/10/2017  ? Adrenal adenoma 05/10/2017  ? Osteoporosis 11/21/2016  ? Fibromyalgia 08/10/2016  ? Female stress incontinence 11/05/2015  ? NASH (nonalcoholic steatohepatitis) 04/24/2015  ? Orthostatic hypotension 09/19/2013  ? Elevated transaminase level 09/19/2013  ? HTN (hypertension), benign 09/03/2013  ? Depression 08/14/2013  ? Fatty liver 08/14/2013  ? Other specified hypothyroidism 10/23/2012  ? Osteopenia 07/08/2012  ? Morbid obesity ([place]) 07/08/2012  ? Urinary bladder incontinence 04/30/2012  ? Family history of rectal cancer 10/04/2011  ? GERD (gastroesophageal reflux  disease) 10/04/2011  ? IBS (irritable bowel syndrome) 10/04/2011  ? ? ?Social Hx   ?Social History  ? ?Socioeconomic History  ? Marital status: Married  ?  Spouse name: Herbie Baltimore  ? Number of children: 2  ? Years of education: Not on file  ? Highest education level: Not on file  ?Occupational History  ? Occupation: retired, Therapist, sports  ?Tobacco Use  ? Smoking status: Former  ?  Packs/day: 1.50  ?  Years: 3.00  ?  Pack years: 4.50  ?  Types: Cigarettes  ?  Quit date: 10/03/1968  ?  Years since quitting: 52.7  ? Smokeless tobacco: Never  ?Vaping Use  ? Vaping Use: Never used  ?Substance and Sexual Activity  ? Alcohol use: No  ? Drug use: No  ? Sexual activity: Not Currently  ?  Birth control/protection: None  ?  Comment: Husband has ED  ?Other Topics Concern  ? Not on file  ?Social History Narrative  ? 2 daughters, Amy and Sharyn Lull. Amy lives with parents. Sharyn Lull lives in [place].  ? ?Social Determinants of Health  ? ?Financial Resource Strain: Low Risk   ? Difficulty of Paying Living Expenses: Not hard at all  ?Food Insecurity: No Food Insecurity  ? Worried About Charity fundraiser in the Last Year: Never true  ? Ran Out of Food in the Last Year: Never true  ?Transportation Needs: No Transportation Needs  ? Lack of Transportation (Medical): No  ? Lack of Transportation (Non-Medical): No  ?Physical Activity: Insufficiently Active  ? Days of Exercise per Week: 3  days  ? Minutes of Exercise per Session: 20 min  ?Stress: No Stress Concern Present  ? Feeling of Stress : Not at all  ?Social Connections: Socially Integrated  ? Frequency of Communication with Friends and Family: More than three times a week  ? Frequency of Social Gatherings with Friends and Family: More than three times a week  ? Attends Religious Services: 1 to 4 times per year  ? Active Member of Clubs or Organizations: Yes  ? Attends Archivist Meetings: 1 to 4 times per year  ? Marital Status: Married  ? ? ?Review of Systems ?Per HPI ? ?Objective:   ?BP 132/78   Pulse 64   Temp 97.7 ?F (36.5 ?C)   Wt 220 lb (99.8 kg) Comment: Dr.Nida yesterday  SpO2 96%   BMI 44.43 kg/m?  ? ?BP/Weight 06/04/2021 06/03/2021 04/29/2021  ?Systolic BP 401 027 253  ?Diastolic BP 78 88 84  ?Wt. (Lbs) 220 220.4 211  ?BMI 44.43 44.52 42.62  ? ? ?Physical Exam ?Constitutional:   ?   General: She is not in acute distress. ?   Appearance: She is obese.  ?HENT:  ?   Head: Normocephalic and atraumatic.  ?Cardiovascular:  ?   Rate and Rhythm: Normal rate and regular rhythm.  ?Pulmonary:  ?   Effort: Pulmonary effort is normal.  ?   Breath sounds: Normal breath sounds.  ?Musculoskeletal:  ?   Comments: Tenderness over the greater trochanter (left).  ?Neurological:  ?   Mental Status: She is alert.  ?Psychiatric:     ?   Mood and Affect: Mood normal.     ?   Behavior: Behavior normal.  ? ? ?Lab Results  ?Component Value Date  ? WBC 11.9 (H) 04/18/2021  ? HGB 12.0 04/18/2021  ? HCT 36.7 04/18/2021  ? PLT 360 04/18/2021  ? GLUCOSE 229 (H) 05/19/2021  ? CHOL 160 02/06/2021  ? TRIG 175 (H) 02/06/2021  ? HDL 47 02/06/2021  ? [place] 83 02/06/2021  ? ALT 18 02/06/2021  ? AST 20 02/06/2021  ? NA 143 05/19/2021  ? K 3.8 05/19/2021  ? CL 105 05/19/2021  ? CREATININE 1.81 (H) 05/19/2021  ? BUN 23 05/19/2021  ? CO2 20 05/19/2021  ? TSH 1.780 05/19/2021  ? INR 1.2 (H) 04/23/2015  ? HGBA1C 7.5 (H) 04/17/2021  ? MICROALBUR 0.4 05/07/2014  ? ? ? ?Assessment & Plan:  ? ?Problem List Items Addressed This Visit   ? ?  ? Musculoskeletal and Integument  ? Trochanteric bursitis  ?  Cannot use NSAIDs in the setting of chronic kidney disease.  Discussed prednisone and steroid injection and patient declined due to elevated blood sugars as well as prior side effects from corticosteroids.  Tramadol as needed for pain.  Supportive care.  Follow-up with PCP. ?  ?  ? ? ?Meds ordered this encounter  ?Medications  ? traMADol (ULTRAM) 50 MG tablet  ?  Sig: Take 1-2 tablets (50-100 mg total) by mouth every 12 (twelve) hours  as needed.  ?  Dispense:  15 tablet  ?  Refill:  0  ? ?Thersa Salt DO ?[place] ? ?

## 2021-06-07 ENCOUNTER — Other Ambulatory Visit: Payer: Self-pay | Admitting: "Endocrinology

## 2021-06-19 ENCOUNTER — Other Ambulatory Visit: Payer: Self-pay | Admitting: *Deleted

## 2021-06-19 DIAGNOSIS — K862 Cyst of pancreas: Secondary | ICD-10-CM

## 2021-06-25 ENCOUNTER — Telehealth: Payer: Self-pay | Admitting: *Deleted

## 2021-06-25 NOTE — Chronic Care Management (AMB) (Signed)
?  Care Management  ? ?Note ? ?06/25/2021 ?Name: [name] MRN: [id number] DOB: November 25, 1945 ? ?Kristen Mack is a 76 y.o. year old female who is a primary care patient of Luking, Elayne Snare, MD and is actively engaged with the care management team. I reached out to [name] by phone today to assist with scheduling an initial visit with the RN Case Manager and canceling upcoming PharmD call. ? ?Follow up plan: ?Unsuccessful telephone outreach attempt made. A HIPAA compliant phone message was left for the patient providing contact information and requesting a return call.  ?The care management team will reach out to the patient again over the next 7 days.  ?If patient returns call to provider office, please advise to call [place] at [phone]. ? ?Laverda Sorenson  ?Care Guide, Embedded Care Coordination ?[facility]  Care Management  ?Direct Dial: [phone] ? ?

## 2021-06-29 ENCOUNTER — Ambulatory Visit (HOSPITAL_COMMUNITY): Payer: HMO | Admitting: Physical Therapy

## 2021-06-30 NOTE — Chronic Care Management (AMB) (Signed)
?  Care Management  ? ?Note ? ?06/30/2021 ?Name: SUMIKO CEASAR MRN: [id number] DOB: Sep 30, 1945 ? ?DARTHY MANGANELLI is a 76 y.o. year old female who is a primary care patient of Luking, Elayne Snare, MD and is actively engaged with the care management team. I reached out to [name] by phone today to assist with scheduling an initial visit with the RN Case Manager ? ?Follow up plan: ?Telephone appointment with care management team member scheduled for:07/15/21 ? ?Laverda Sorenson  ?Care Guide, Embedded Care Coordination ?[place]  Care Management  ?Direct Dial: [phone] ? ?

## 2021-07-03 ENCOUNTER — Telehealth: Payer: HMO

## 2021-07-06 ENCOUNTER — Ambulatory Visit: Payer: HMO | Admitting: Family Medicine

## 2021-07-07 ENCOUNTER — Other Ambulatory Visit: Payer: Self-pay | Admitting: Family Medicine

## 2021-07-07 ENCOUNTER — Ambulatory Visit (HOSPITAL_BASED_OUTPATIENT_CLINIC_OR_DEPARTMENT_OTHER): Admission: RE | Admit: 2021-07-07 | Payer: HMO | Source: Ambulatory Visit

## 2021-07-09 ENCOUNTER — Ambulatory Visit (HOSPITAL_BASED_OUTPATIENT_CLINIC_OR_DEPARTMENT_OTHER)
Admission: RE | Admit: 2021-07-09 | Discharge: 2021-07-09 | Disposition: A | Discharge status: Home / Self Care | Payer: HMO | Source: Ambulatory Visit | Attending: Family Medicine | Admitting: Family Medicine

## 2021-07-09 DIAGNOSIS — K862 Cyst of pancreas: Secondary | ICD-10-CM | POA: Insufficient documentation

## 2021-07-09 DIAGNOSIS — I700 Atherosclerosis of aorta: Secondary | ICD-10-CM | POA: Diagnosis not present

## 2021-07-09 DIAGNOSIS — Z9049 Acquired absence of other specified parts of digestive tract: Secondary | ICD-10-CM | POA: Diagnosis not present

## 2021-07-09 LAB — POCT I-STAT CREATININE: Creatinine, Ser: 1.6 mg/dL — ABNORMAL HIGH (ref 0.44–1.00)

## 2021-07-09 MED ORDER — IOHEXOL 300 MG/ML  SOLN
100.0000 mL | Freq: Once | INTRAMUSCULAR | Status: AC | PRN
Start: 2021-07-09 — End: 2021-07-09
  Administered 2021-07-09: 80 mL via INTRAVENOUS

## 2021-07-15 ENCOUNTER — Telehealth: Payer: Self-pay | Admitting: "Endocrinology

## 2021-07-15 ENCOUNTER — Ambulatory Visit (INDEPENDENT_AMBULATORY_CARE_PROVIDER_SITE_OTHER): Payer: HMO | Admitting: *Deleted

## 2021-07-15 DIAGNOSIS — N183 Type 2 diabetes mellitus with diabetic chronic kidney disease: Secondary | ICD-10-CM

## 2021-07-15 DIAGNOSIS — I10 Essential (primary) hypertension: Secondary | ICD-10-CM

## 2021-07-15 NOTE — Telephone Encounter (Signed)
-----   Message from Cassandria Anger, MD sent at 07/15/2021  3:03 PM EDT ----- ?Regarding: FW: update ?We need to see this patient with her meter and logs soon as possible. ?----- Message ----- ?From: Kassie Mends, RN ?Sent: 07/15/2021  11:50 AM EDT ?To: Cassandria Anger, MD, Kathyrn Drown, MD ?Subject: update                                        ? ?Update- pt reports over past 4 days her fasting CBG has been over 600, she has been able to get it back down to 300 during the day, she denies any changes such as infection, etc.  Please advise if she needs to be seen. ?Also, she stopped taking her lasix due to feeling like " she is urinating out all her electrolytes"  I instructed her to take meds as prescribed. ?thanks ? ? ?

## 2021-07-15 NOTE — Telephone Encounter (Signed)
I called patient and left her a VM to call us back so we could speak with her ?

## 2021-07-15 NOTE — Chronic Care Management (AMB) (Signed)
?Chronic Care Management  ? ?CCM RN Visit Note ? ?07/15/2021 ?Name: Kristen Mack MRN: [id number] DOB: [birth year] ? ?Subjective: ?Kristen Mack is a 76 y.o. year old female who is a primary care patient of Luking, Elayne Snare, MD. The care management team was consulted for assistance with disease management and care coordination needs.   ? ?Engaged with patient by telephone for initial visit in response to provider referral for case management and/or care coordination services.  ? ?Consent to Services:  ?The patient was given the following information about Chronic Care Management services today, agreed to services, and gave verbal consent: 1. CCM service includes personalized support from designated clinical staff supervised by the primary care provider, including individualized plan of care and coordination with other care providers 2. 24/7 contact phone numbers for assistance for urgent and routine care needs. 3. Service will only be billed when office clinical staff spend 20 minutes or more in a month to coordinate care. 4. Only one practitioner may furnish and bill the service in a calendar month. 5.The patient may stop CCM services at any time (effective at the end of the month) by phone call to the office staff. 6. The patient will be responsible for cost sharing (co-pay) of up to 20% of the service fee (after annual deductible is met). Patient agreed to services and consent obtained. ? ?Patient agreed to services and verbal consent obtained.  ? ?Assessment: Review of patient past medical history, allergies, medications, health status, including review of consultants reports, laboratory and other test data, was performed as part of comprehensive evaluation and provision of chronic care management services.  ? ?SDOH (Social Determinants of Health) assessments and interventions performed:  ?SDOH Interventions   ? ?Flowsheet Row Most Recent Value  ?SDOH Interventions   ?Food Insecurity Interventions  Intervention Not Indicated  ?Housing Interventions Intervention Not Indicated  ?Transportation Interventions Intervention Not Indicated  ? ?  ?  ? ?[place] ? ?Allergies  ?Allergen Reactions  ? Ambien [Zolpidem Tartrate] Other (See Comments)  ?  Side effect "lost a couple days"; memory loss  ? Cefzil [Cefprozil] Diarrhea  ? Codeine Itching and Nausea And Vomiting  ? Demerol [Meperidine] Other (See Comments)  ?  "messes up senses"  ? Formaldehyde   ? Latex Other (See Comments)  ?  "skin gets raw"  ? Sulfa Antibiotics Other (See Comments)  ?  Severe abd cramp  ? Celexa [Citalopram Hydrobromide] Other (See Comments)  ?  Mouth sores  ? ? ?Outpatient Encounter Medications as of 07/15/2021  ?Medication Sig Note  ? albuterol (VENTOLIN HFA) 108 (90 Base) MCG/ACT inhaler INHALE 2 PUFFS INTO LUNGS EVERY 6 HOURS AS NEEDED FOR SHORTNESS OF BREATH   ? Ascorbic Acid (VITAMIN C) 1000 MG tablet Take 6,000 mg by mouth daily.    ? Azelastine HCl 137 MCG/SPRAY SOLN USE 2 SPRAY(S) IN EACH NOSTRIL TWICE DAILY   ? B Complex Vitamins (B COMPLEX 100 PO) Take 1 tablet by mouth daily.   ? buPROPion (WELLBUTRIN XL) 300 MG 24 hr tablet Take 1 tablet (300 mg total) by mouth daily.   ? Chromium 1000 MCG TABS Take 1,000 mcg by mouth daily.   ? Coenzyme Q10 (COQ10) 200 MG CAPS Take 400 mg by mouth daily.   ? Cyanocobalamin (B-12) 1000 MCG SUBL Place 1,000 mcg under the tongue daily.   ? DIGESTIVE ENZYMES PO Take 1 tablet by mouth 3 (three) times daily.    ? diltiazem (CARDIZEM CD) 120  MG 24 hr capsule Take 1 capsule by mouth once daily   ? empagliflozin (JARDIANCE) 10 MG TABS tablet Take 1 tablet (10 mg total) by mouth daily before breakfast.   ? furosemide (LASIX) 20 MG tablet Take 1 tablet (20 mg total) by mouth daily.   ? glucose blood (ONETOUCH VERIO) test strip Test once daily   ? ipratropium (ATROVENT) 0.02 % nebulizer solution USE ONE VIAL IN NEBULIZER 4 TIMES DAILY AS NEEDED FOR SHORTNESS OF BREATH   ? levothyroxine (SYNTHROID) 125 MCG  tablet TAKE 1 TABLET BY MOUTH DAILY BEFORE BREAKFAST   ? Lysine HCl 500 MG TABS Take 500 mg by mouth daily.   ? Magnesium 250 MG TABS Take 250 mg by mouth 2 (two) times daily. 06/30/2020: Triple Mag   ? meclizine (ANTIVERT) 25 MG tablet Take 25 mg by mouth 3 (three) times daily as needed for dizziness.   ? montelukast (SINGULAIR) 10 MG tablet TAKE 1 TABLET BY MOUTH AT BEDTIME   ? OVER THE COUNTER MEDICATION Take 1 Scoop by mouth 3 (three) times daily with meals. Fennel seeds   ? OVER THE COUNTER MEDICATION Take 1 tablet by mouth at bedtime as needed (sleep). Sleep essentials   ? rosuvastatin (CRESTOR) 5 MG tablet TAKE 1 TABLET BY MOUTH ON MONDAY, WEDNESDAY AND FRIDAY   ? sitaGLIPtin (JANUVIA) 50 MG tablet Take one tablet po daily   ? TART CHERRY PO Take 1 tablet by mouth in the morning, at noon, and at bedtime.   ? tiZANidine (ZANAFLEX) 2 MG tablet 1 qhs prn caution drowsiness   ? Vitamin A 3 MG (10000 UT) TABS Take 10,000 Units by mouth daily.   ? vitamin E 180 MG (400 UNITS) capsule Take 800 Units by mouth daily.    ? nystatin ointment (MYCOSTATIN) Apply 1 application topically 2 (two) times daily.   ? traMADol (ULTRAM) 50 MG tablet Take 1-2 tablets (50-100 mg total) by mouth every 12 (twelve) hours as needed.   ? ?No facility-administered encounter medications on file as of 07/15/2021.  ? ? ?Patient Active Problem List  ? Diagnosis Date Noted  ? Trochanteric bursitis 06/05/2021  ? Bruising tendency 06/30/2020  ? Mixed hyperlipidemia 02/13/2020  ? Diabetes mellitus without complication ([place]) 62/26/3335  ? Sleep apnea 07/11/2017  ? Pancreatic pseudocyst 05/10/2017  ? Adrenal adenoma 05/10/2017  ? Osteoporosis 11/21/2016  ? Fibromyalgia 08/10/2016  ? Female stress incontinence 11/05/2015  ? NASH (nonalcoholic steatohepatitis) 04/24/2015  ? Orthostatic hypotension 09/19/2013  ? Elevated transaminase level 09/19/2013  ? HTN (hypertension), benign 09/03/2013  ? Depression 08/14/2013  ? Fatty liver 08/14/2013  ? Other  specified hypothyroidism 10/23/2012  ? Osteopenia 07/08/2012  ? Morbid obesity ([place]) 07/08/2012  ? Urinary bladder incontinence 04/30/2012  ? Family history of rectal cancer 10/04/2011  ? GERD (gastroesophageal reflux disease) 10/04/2011  ? IBS (irritable bowel syndrome) 10/04/2011  ? ? ?Conditions to be addressed/monitored:HTN and DMII ? ?Care Plan : RN Care Manager Plan of Care  ?Updates made by Kassie Mends, RN since 07/15/2021 12:00 AM  ?  ? ?Problem: No plan of care established for management of chronic disease state  (DM2, HTN)   ?Priority: High  ?  ? 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  ?Patient reports fasting CBG is usually 200-350 but over past 4 days has been over 600 and pt has been able to bring it back down too around 300, pt has not reported this to her doctor, pt does not report any types of infection and changes, pt reports she stopped taking lasix because she feels like "I'm urinating out all my electrolytes" ? ?RNCM Clinical Goal(s):  ?Patient will verbalize understanding of plan for management of HTN and DMII as evidenced by patient report, review of EHR ?take all medications exactly as prescribed and will call provider for medication related questions as evidenced by patient report, review of EHR and    through collaboration with RN Care manager, provider, and care team.   ? ?Interventions: ?1:1 collaboration with primary care provider regarding development and update of comprehensive plan of care as evidenced by provider attestation and co-signature ?Inter-disciplinary care team co

## 2021-07-15 NOTE — Patient Instructions (Signed)
Visit Information  ? ?Thank you for taking time to visit with me today. Please don't hesitate to contact me if I can be of assistance to you before our next scheduled telephone appointment. ? ?Following are the goals we discussed today:  ?Take medications as prescribed   ?Attend all scheduled provider appointments ?Call pharmacy for medication refills 3-7 days in advance of running out of medications ?Perform all self care activities independently  ?Call provider office for new concerns or questions  ?check blood sugar at prescribed times: per MD order  ?check feet daily for cuts, sores or redness ?enter blood sugar readings and medication or insulin into daily log ?take the blood sugar log to all doctor visits ?take the blood sugar meter to all doctor visits ?fill half of plate with vegetables ?limit fast food meals to no more than 1 per week ?prepare main meal at home 3 to 5 days each week ?read food labels for fat, fiber, carbohydrates and portion size ?check blood pressure weekly ?choose a place to take my blood pressure (home, clinic or office, retail store) ?write blood pressure results in a log or diary ?keep a blood pressure log ?take blood pressure log to all doctor appointments ?keep all doctor appointments ?take medications for blood pressure exactly as prescribed ?eat more whole grains, fruits and vegetables, lean meats and healthy fats ?Please follow carbohydrate modified diet ?Look over education sent via My Chart- low sodium diet, hypoglycemia ?Drink adequate fluids, preferably water ?Elevated blood sugar readings have been reported, please follow up with your primary doctor or Dr. Dorris Fetch ?fall prevention strategies: change position slowly, use assistive device such as walker or cane (per provider recommendations) when walking, keep walkways clear, have good lighting in room. It is important to contact your provider if you have any falls, maintain muscle strength/tone by exercise per provider  recommendations. ? ?Our next appointment is by telephone on 08/12/21 at 945 am ? ?Please call the care guide team at [phone] if you need to cancel or reschedule your appointment.  ? ?If you are experiencing a Mental Health or [place] or need someone to talk to, please call the Suicide and Crisis Lifeline: 988 ?call the [place] National Suicide Prevention Lifeline: 724-141-9267 or TTY: 769-106-3603 TTY 518-802-9795) to talk to a trained counselor ?call 1-800-273-TALK (toll free, 24 hour hotline) ?go to [facility] [address] [place] [phone]) ?call the [facility]: [phone] ?call 911  ? ?Following is a copy of your full care plan:  ?Care Plan : [place] of Care  ?Updates made by Kassie Mends, RN since 07/15/2021 12:00 AM  ?  ? ?Problem: No plan of care established for management of chronic disease state  (DM2, HTN)   ?Priority: High  ?  ? 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  ?  Patient reports fasting CBG is usually 200-350 but over past 4 days has been over 600 and pt has been able to bring it back down too around 300, pt has not reported this to her doctor, pt does not report any types of infection and changes, pt reports she stopped taking lasix because she feels like "I'm urinating out all my  electrolytes" ? ?RNCM Clinical Goal(s):  ?Patient will verbalize understanding of plan for management of HTN and DMII as evidenced by patient report, review of EHR ?take all medications exactly as prescribed and will call provider for medication related questions as evidenced by patient report, review of EHR and    through collaboration with RN Care manager, provider, and care team.  ? ?Interventions: ?1:1 collaboration with primary care provider regarding development and update of comprehensive plan of care as evidenced by provider attestation and co-signature ?Inter-disciplinary care team collaboration (see longitudinal plan of care) ?Evaluation of current treatment plan related to  self management and patient's adherence to plan as established by provider ? ? ?Diabetes Interventions:  (Status:  New goal. and Goal on track:  Yes.) Long Term Goal ?Assessed patient's understanding of A1c goal: <7% ?Provided education to patient about basic DM disease process ?Reviewed medications with patient and discussed importance of medication adherence ?Discussed plans with patient for ongoing care management follow up and provided patient with direct contact information for care management team ?Provided patient with written educational materials related to hypo and hyperglycemia and importance of correct treatment ?Review of patient status, including review of consultants reports, relevant laboratory and other test results, and medications completed ?Screening for signs and symptoms of depression related to chronic disease state  ?Assessed social determinant of health barriers ?Reviewed carbohydrate modified diet ?Education sent via My Chart- hypoglycemia ?In basket sent to Dr. Wolfgang Phoenix and Dr. Dorris Fetch report over the past 4 days, fasting CBG has been over 600 and pt is able to bring it down to 200-350, reports pt has stopped taking lasix citing she feels she is urinating out all her electrolytes ?Lab Results  ?Component Value  Date  ? HGBA1C 7.5 (H) 04/17/2021  ? ?Hypertension Interventions:  (Status:  New goal. and Goal on track:  Yes.) Long Term Goal ?Last practice recorded BP readings:  ?BP Readings from Last 3 Encounters:  ?06/04/21 132/78  ?06/03/21 (!) 144/88  ?04/29/21 138/84  ?Most recent eGFR/CrCl:  ?Lab Results  ?Component Value Date  ? EGFR 28 (L) 05/19/2021  ?  No components found for: CRCL ? ?Evaluation of current treatment plan related to hypertension self management and patient's adherence to plan as established by provider ?Reviewed medications with patient and discussed importance of compliance ?Discussed plans with patient for ongoing care management follow up and provided patient with direct contact information for care management team ?Advised patient, providing education and rationale, to monitor blood pressure daily and record, calling PCP for findings outside established parameters ?Discussed complications of poorly controlled blood pressure such as heart disease, stroke, circulatory complications, vision complications, kidney impairment, sexual dysfunction ?Screening for signs and symptoms of depression related to chronic disease state  ?Assessed social determinant of health barriers  ?Education sent via My Chart- low sodium diet ? ?Patient Goals/Self-Care Activities: ?Take medications as prescribed   ?Attend all scheduled provider appointments ?Call pharmacy for medication refills 3-7 days in advance of running out of medications ?Perform all self care activities independently  ?Call provider office for new concerns or questions  ?check blood sugar at prescribed times:  per MD order  ?check feet daily for cuts, sores or redness ?enter blood sugar readings and medication or insulin into daily log ?take the blood sugar log to all doctor visits ?take the blood sugar meter to all doctor visits ?fill half of plate with vegetables ?limit fast food meals to no more than 1 per week ?prepare main meal at home 3 to 5 days  each week ?read food labels for fat, fiber, carbohydrates and portion size ?check blood pressure weekly ?choose a place to take my blood pressure (home, clinic or office, retail store) ?write blood pressure results in

## 2021-07-16 ENCOUNTER — Encounter: Payer: Self-pay | Admitting: Nurse Practitioner

## 2021-07-16 ENCOUNTER — Ambulatory Visit (INDEPENDENT_AMBULATORY_CARE_PROVIDER_SITE_OTHER): Payer: HMO | Admitting: Nurse Practitioner

## 2021-07-16 VITALS — BP 140/88 | HR 85 | Temp 97.5°F | Ht 59.0 in | Wt 207.0 lb

## 2021-07-16 DIAGNOSIS — E1122 Type 2 diabetes mellitus with diabetic chronic kidney disease: Secondary | ICD-10-CM

## 2021-07-16 DIAGNOSIS — I129 Hypertensive chronic kidney disease with stage 1 through stage 4 chronic kidney disease, or unspecified chronic kidney disease: Secondary | ICD-10-CM

## 2021-07-16 DIAGNOSIS — N183 Chronic kidney disease, stage 3 unspecified: Secondary | ICD-10-CM | POA: Diagnosis not present

## 2021-07-16 LAB — GLUCOSE, POCT (MANUAL RESULT ENTRY): POC Glucose: 518 mg/dl — AB (ref 70–99)

## 2021-07-16 MED ORDER — ONETOUCH VERIO VI STRP: ORAL_STRIP | 3 refills | Status: DC

## 2021-07-16 MED ORDER — LEVEMIR FLEXPEN 100 UNIT/ML ~~LOC~~ SOPN: 10.0000 [IU] | PEN_INJECTOR | Freq: Every day | SUBCUTANEOUS | 11 refills | Status: DC

## 2021-07-16 NOTE — Progress Notes (Signed)
? ?  Subjective:  ? ? Patient ID: Kristen Mack, female    DOB: 29-Nov-1945, 76 y.o.   MRN: [id number] ? ?HPI ? ?76 year old female patient here with daughter with complaints of elevated blood sugars.  Patient has history of uncontrolled diabetes.  Patient currently taking Jardiance 10 mg and Januvia 50 mg for blood sugar control.   ? ?Patient states that around April 16 her glucometer began to read "high".  Patient states that she has noticed that she has had to use the restroom more often since that time.  Patient is also admitted that she has been snacking a lot more.  Patient also admits to having increased dizziness over the past couple weeks.  Patient does admit to drinking sodas to settle her stomach.  Patient states before April 16 her blood sugars are running in the 300s to 400s.   ? ?Patient denies any loss of consciousness, chest pain, new shortness of breath, difficulty breathing, changes to vision.   ? ? ?Review of Systems  ?Endocrine: Positive for polyphagia and polyuria. Negative for polydipsia.  ?All other systems reviewed and are negative. ? ?   ?Objective:  ? Physical Exam ?Vitals reviewed.  ?Constitutional:   ?   General: She is not in acute distress. ?   Appearance: Normal appearance. She is obese. She is not ill-appearing, toxic-appearing or diaphoretic.  ?HENT:  ?   Head: Normocephalic and atraumatic.  ?Cardiovascular:  ?   Rate and Rhythm: Normal rate and regular rhythm.  ?   Pulses: Normal pulses.  ?   Heart sounds: Normal heart sounds. No murmur heard. ?Pulmonary:  ?   Effort: No respiratory distress.  ?   Breath sounds: Normal breath sounds. No wheezing.  ?Musculoskeletal:  ?   Comments: Uses walker to ambulate  ?Skin: ?   General: Skin is warm.  ?   Capillary Refill: Capillary refill takes less than 2 seconds.  ?Neurological:  ?   Mental Status: She is alert.  ?   Comments: Grossly intact  ?Psychiatric:     ?   Mood and Affect: Mood normal.     ?   Behavior: Behavior normal.  ? ? ?    ?Assessment & Plan:  ? ?1. Type 2 DM with CKD stage 3 and hypertension ([place]) ?-Patient needs to start insulin today. ?-Patient to notify clinic of what blood sugars are doing tomorrow afternoon ?-We will gradually increase patient's basal insulin by 2 to 4 units until we are able to get in control. ?-Discussed signs and symptoms of hypoglycemia in detail.   ?-Patient to start medication tomorrow morning. ?-We will assess patient's electrolytes.  If bicarb off will suggest that patient goes to the emergency room. ?- POCT glucose (manual entry) = "HIGH" ?- insulin detemir (LEVEMIR FLEXPEN) 100 UNIT/ML FlexPen; Inject 10 Units into the skin daily.  Dispense: 15 mL; Refill: 11 ?- CMP14+EGFR ?- Hemoglobin A1c ?- glucose blood (ONETOUCH VERIO) test strip; Test once daily  Dispense: 100 each; Refill: 3 ?-Return to clinic in 2 weeks ?-Send blood sugars or contact clinic with blood sugar readings Friday afternoon ?-Cut down on sodas whenever possible ?  ?Note:  This document was prepared using Dragon voice recognition software and may include unintentional dictation errors. ? ?

## 2021-07-16 NOTE — Telephone Encounter (Signed)
Pt called back and said she does not drive and will talk with her daughter on when she can bring her and call us back ?

## 2021-07-16 NOTE — Telephone Encounter (Signed)
Patient's daughte Amy will call back tomorrow to set up something next week ?

## 2021-07-17 ENCOUNTER — Telehealth: Payer: Self-pay | Admitting: Family Medicine

## 2021-07-17 ENCOUNTER — Other Ambulatory Visit: Payer: Self-pay

## 2021-07-17 DIAGNOSIS — N183 Chronic kidney disease, stage 3 unspecified: Secondary | ICD-10-CM

## 2021-07-17 MED ORDER — ONETOUCH VERIO VI STRP: ORAL_STRIP | 3 refills | Status: DC

## 2021-07-17 MED ORDER — BLOOD GLUCOSE METER KIT: PACK | 0 refills | Status: AC

## 2021-07-17 NOTE — Telephone Encounter (Signed)
Please see result note ? ?Patient needs to start long-acting insulin today ?Check glucoses twice daily ?Avoid simple carbohydrates such as sugary drinks, white bread, sugar, white potatoes, white rice ? ?If for any reason patient starts getting lethargic passing out etc. automatically go to the ER/911 ? ?If glucoses are still in the 500s on Sunday she should increase long-acting insulin to 14 units ? ?She should give [place] feedback by Monday how her glucose readings are doing ? ?I would recommend stopping Januvia if she has not already done so. ? ?She has an appointment on the 26 with Dr. Dorris Fetch very important to keep ? ?If any questions problems concerns let me know ? ? ?

## 2021-07-17 NOTE — Telephone Encounter (Signed)
Pt contacted. Pt asked to send this message to her my chart. Message sent to patient my chart.  ?

## 2021-07-18 LAB — CMP14+EGFR
ALT: 23 IU/L (ref 0–32)
AST: 20 IU/L (ref 0–40)
Albumin/Globulin Ratio: 1.1 — ABNORMAL LOW (ref 1.2–2.2)
Albumin: 4 g/dL (ref 3.7–4.7)
Alkaline Phosphatase: 107 IU/L (ref 44–121)
BUN/Creatinine Ratio: 10 — ABNORMAL LOW (ref 12–28)
BUN: 17 mg/dL (ref 8–27)
Bilirubin Total: 0.4 mg/dL (ref 0.0–1.2)
CO2: 21 mmol/L (ref 20–29)
Calcium: 10.1 mg/dL (ref 8.7–10.3)
Chloride: 96 mmol/L (ref 96–106)
Creatinine, Ser: 1.69 mg/dL — ABNORMAL HIGH (ref 0.57–1.00)
Globulin, Total: 3.6 g/dL (ref 1.5–4.5)
Glucose: 543 mg/dL (ref 70–99)
Potassium: 4 mmol/L (ref 3.5–5.2)
Sodium: 133 mmol/L — ABNORMAL LOW (ref 134–144)
Total Protein: 7.6 g/dL (ref 6.0–8.5)
eGFR: 31 mL/min/{1.73_m2} — ABNORMAL LOW (ref >59)

## 2021-07-18 LAB — HEMOGLOBIN A1C
Est. average glucose Bld gHb Est-mCnc: 312 mg/dL
Hgb A1c MFr Bld: 12.5 % — ABNORMAL HIGH (ref 4.8–5.6)

## 2021-07-20 ENCOUNTER — Encounter: Payer: Self-pay | Admitting: Family Medicine

## 2021-07-21 ENCOUNTER — Encounter: Payer: Self-pay | Admitting: Family Medicine

## 2021-07-22 ENCOUNTER — Encounter: Payer: Self-pay | Admitting: "Endocrinology

## 2021-07-22 ENCOUNTER — Ambulatory Visit (INDEPENDENT_AMBULATORY_CARE_PROVIDER_SITE_OTHER): Payer: HMO | Admitting: "Endocrinology

## 2021-07-22 VITALS — BP 140/82 | HR 64 | Ht 59.0 in | Wt 209.0 lb

## 2021-07-22 DIAGNOSIS — I129 Hypertensive chronic kidney disease with stage 1 through stage 4 chronic kidney disease, or unspecified chronic kidney disease: Secondary | ICD-10-CM | POA: Diagnosis not present

## 2021-07-22 DIAGNOSIS — E1122 Type 2 diabetes mellitus with diabetic chronic kidney disease: Secondary | ICD-10-CM | POA: Diagnosis not present

## 2021-07-22 DIAGNOSIS — N183 Chronic kidney disease, stage 3 unspecified: Secondary | ICD-10-CM | POA: Diagnosis not present

## 2021-07-22 DIAGNOSIS — E119 Type 2 diabetes mellitus without complications: Secondary | ICD-10-CM

## 2021-07-22 MED ORDER — LEVEMIR FLEXPEN 100 UNIT/ML ~~LOC~~ SOPN: 50.0000 [IU] | PEN_INJECTOR | Freq: Every day | SUBCUTANEOUS | 1 refills | Status: DC

## 2021-07-22 NOTE — Progress Notes (Signed)
? ?    ?                                           07/22/2021, 6:00 PM ?    ?Endocrinology follow-up note ? ? ?Subjective:  ? ? Patient ID: Kristen Mack, female    DOB: 1945-11-20, PCP Kathyrn Drown, MD ? ? ?Past Medical History:  ?Diagnosis Date  ? ADHD (attention deficit hyperactivity disorder) 2007  ? Allergy 1964  ? Arthritis   ? Asthma   ? Cataract   ? CFS (chronic fatigue syndrome)   ? Chronic kidney disease 2021  ? Cracked tooth   ? X2  RIGHT LOWER MOLAR AND LEFT UPPER MOLAR  ? Depression   ? Diabetes mellitus without complication ([place])   ? Fatty liver disease, nonalcoholic   ? Fibromyalgia   ? GERD (gastroesophageal reflux disease)   ? History of adenomatous polyp of colon   ? History of cardiac murmur   ? until age 30- pt has had echocardiograms  ? History of hiatal hernia 2021  ? small hiatal hernia was seen on scan September 2021  ? History of TIA (transient ischemic attack)   ? per CT scan   ? Hyperlipidemia   ? Hypertension   ? Hypothyroidism   ? IBS (irritable bowel syndrome)   ? Meniere disease   ? OSA (obstructive sleep apnea) 07/11/2017  ? pt uses CPAP  ? Osteopenia   ? PMB (postmenopausal bleeding)   ? PVC's (premature ventricular contractions)   ? Stroke [facility])   ? Tooth loose   ? LOWER FRONT  ? ?Past Surgical History:  ?Procedure Laterality Date  ? [place]  ? CHOLECYSTECTOMY  1995  ? and  REPAIR UMBILICAL HERNIA  ? COLONOSCOPY N/A 04/19/2013  ? Procedure: COLONOSCOPY;  Surgeon: Rogene Houston, MD;  Location[facility];  Service: Endoscopy;  Laterality: N/A;    ? ESOPHAGOGASTRODUODENOSCOPY  02/08/2003  ? HERNIA REPAIR  1995  ? HYSTEROSCOPY WITH D & C  11/14/2002  ? endometrial polypectomy  ? HYSTEROSCOPY WITH D & C N/A 04/05/2014  ? Procedure: DILATATION AND CURETTAGE /HYSTEROSCOPY, ENDOMETRIAL POLYPECTOMY;  Surgeon: Sanjuana Kava, MD;  Location: [facility];  Service: Gynecology;  Laterality: N/A;  ? LIVER BIOPSY  01/29/2004  ? benign  ? TUBAL  LIGATION  1979  ? ?Social History  ? ?Socioeconomic History  ? Marital status: Married  ?  Spouse name: Herbie Baltimore  ? Number of children: 2  ? Years of education: Not on file  ? Highest education level: Not on file  ?Occupational History  ? Occupation: retired, Therapist, sports  ?Tobacco Use  ? Smoking status: Former  ?  Packs/day: 1.50  ?  Years: 3.00  ?  Pack years: 4.50  ?  Types: Cigarettes  ?  Quit date: 10/03/1968  ?  Years since quitting: 52.8  ? Smokeless tobacco: Never  ?Vaping Use  ? Vaping Use: Never used  ?Substance and Sexual Activity  ? Alcohol use: No  ? Drug use: No  ? Sexual activity: Not Currently  ?  Birth control/protection: None  ?  Comment: Husband has ED  ?Other Topics Concern  ? Not on file  ?Social History Narrative  ? 2 daughters, Amy and Sharyn Lull. Amy lives with parents. Sharyn Lull lives in [place].  ? ?Social  Determinants of Health  ? ?Financial Resource Strain: Low Risk   ? Difficulty of Paying Living Expenses: Not hard at all  ?Food Insecurity: No Food Insecurity  ? Worried About Charity fundraiser in the Last Year: Never true  ? Ran Out of Food in the Last Year: Never true  ?Transportation Needs: No Transportation Needs  ? Lack of Transportation (Medical): No  ? Lack of Transportation (Non-Medical): No  ?Physical Activity: Insufficiently Active  ? Days of Exercise per Week: 3 days  ? Minutes of Exercise per Session: 20 min  ?Stress: No Stress Concern Present  ? Feeling of Stress : Not at all  ?Social Connections: Socially Integrated  ? Frequency of Communication with Friends and Family: More than three times a week  ? Frequency of Social Gatherings with Friends and Family: More than three times a week  ? Attends Religious Services: 1 to 4 times per year  ? Active Member of Clubs or Organizations: Yes  ? Attends Archivist Meetings: 1 to 4 times per year  ? Marital Status: Married  ? ?Outpatient Encounter Medications as of 07/22/2021  ?Medication Sig  ? albuterol (VENTOLIN HFA) 108 (90 Base)  MCG/ACT inhaler INHALE 2 PUFFS INTO LUNGS EVERY 6 HOURS AS NEEDED FOR SHORTNESS OF BREATH  ? Ascorbic Acid (VITAMIN C) 1000 MG tablet Take 6,000 mg by mouth daily.   ? Azelastine HCl 137 MCG/SPRAY SOLN USE 2 SPRAY(S) IN EACH NOSTRIL TWICE DAILY  ? B Complex Vitamins (B COMPLEX 100 PO) Take 1 tablet by mouth daily.  ? blood glucose meter kit and supplies Dispense based on patient and insurance preference. Use to test sugars BID  (FOR ICD-10 E10.9, E11.9).  ? buPROPion (WELLBUTRIN XL) 300 MG 24 hr tablet Take 1 tablet (300 mg total) by mouth daily.  ? Chromium 1000 MCG TABS Take 1,000 mcg by mouth daily.  ? Coenzyme Q10 (COQ10) 200 MG CAPS Take 400 mg by mouth daily.  ? Cyanocobalamin (B-12) 1000 MCG SUBL Place 1,000 mcg under the tongue daily.  ? DIGESTIVE ENZYMES PO Take 1 tablet by mouth 3 (three) times daily.   ? diltiazem (CARDIZEM CD) 120 MG 24 hr capsule Take 1 capsule by mouth once daily  ? empagliflozin (JARDIANCE) 10 MG TABS tablet Take 1 tablet (10 mg total) by mouth daily before breakfast.  ? glucose blood (ONETOUCH VERIO) test strip Use to test blood sugars twice daily  ? insulin detemir (LEVEMIR FLEXPEN) 100 UNIT/ML FlexPen Inject 50 Units into the skin at bedtime.  ? ipratropium (ATROVENT) 0.02 % nebulizer solution USE ONE VIAL IN NEBULIZER 4 TIMES DAILY AS NEEDED FOR SHORTNESS OF BREATH  ? levothyroxine (SYNTHROID) 125 MCG tablet TAKE 1 TABLET BY MOUTH DAILY BEFORE BREAKFAST  ? Lysine HCl 500 MG TABS Take 500 mg by mouth daily.  ? Magnesium 250 MG TABS Take 250 mg by mouth 2 (two) times daily.  ? meclizine (ANTIVERT) 25 MG tablet Take 25 mg by mouth 3 (three) times daily as needed for dizziness.  ? montelukast (SINGULAIR) 10 MG tablet TAKE 1 TABLET BY MOUTH AT BEDTIME  ? OVER THE COUNTER MEDICATION Take 1 Scoop by mouth 3 (three) times daily with meals. Fennel seeds  ? OVER THE COUNTER MEDICATION Take 1 tablet by mouth at bedtime as needed (sleep). Sleep essentials  ? rosuvastatin (CRESTOR) 5 MG tablet  TAKE 1 TABLET BY MOUTH ON MONDAY, WEDNESDAY AND FRIDAY  ? TART CHERRY PO Take 1 tablet by mouth in  the morning, at noon, and at bedtime.  ? tiZANidine (ZANAFLEX) 2 MG tablet 1 qhs prn caution drowsiness  ? Vitamin A 3 MG (10000 UT) TABS Take 10,000 Units by mouth daily.  ? vitamin E 180 MG (400 UNITS) capsule Take 800 Units by mouth daily.   ? [DISCONTINUED] insulin detemir (LEVEMIR FLEXPEN) 100 UNIT/ML FlexPen Inject 10 Units into the skin daily. (Patient taking differently: Inject 24 Units into the skin daily.)  ? ?No facility-administered encounter medications on file as of 07/22/2021.  ? ?ALLERGIES: ?Allergies  ?Allergen Reactions  ? Ambien [Zolpidem Tartrate] Other (See Comments)  ?  Side effect "lost a couple days"; memory loss  ? Cefzil [Cefprozil] Diarrhea  ? Codeine Itching and Nausea And Vomiting  ? Demerol [Meperidine] Other (See Comments)  ?  "messes up senses"  ? Formaldehyde   ? Latex Other (See Comments)  ?  "skin gets raw"  ? Sulfa Antibiotics Other (See Comments)  ?  Severe abd cramp  ? Celexa [Citalopram Hydrobromide] Other (See Comments)  ?  Mouth sores  ? ? ?VACCINATION STATUS: ?Immunization History  ?Administered Date(s) Administered  ? Fluad Quad(high Dose 65+) 01/22/2020, 02/10/2021  ? Influenza, High Dose Seasonal PF 04/15/2018, 01/13/2019  ? Influenza,inj,Quad PF,6+ Mos 03/02/2016, 03/03/2017  ? Influenza-Unspecified 04/15/2018, 01/15/2019  ? Moderna Sars-Covid-2 Vaccination 05/23/2019, 06/19/2019, 03/05/2020  ? Pneumococcal Conjugate-13 05/22/2014  ? Pneumococcal Polysaccharide-23 03/03/2017  ? Tdap 04/18/2020  ? Zoster Recombinat (Shingrix) 12/08/2018, 04/27/2019  ? ? ?HPI ?Kristen Mack is 76 y.o. female who is accompanied by her daughter to clinic.  She usually follows in this clinic for stable, nonfunctioning 1.5 cm left adrenal adenoma which was previously worked up completely.  ?She is also in follow up for hypothyroidism. ?Recently, she lost control of her diabetes with her 2 m  average showing 450 for the last 30 days.  She was started on low-dose Levemir by Dr. Wolfgang Phoenix.  Her recent A1c was found to be 12.5% on July 16, 2021 increasing rapidly from 7.5%. ?She was taken off of

## 2021-07-22 NOTE — Patient Instructions (Signed)

## 2021-07-26 DIAGNOSIS — I129 Hypertensive chronic kidney disease with stage 1 through stage 4 chronic kidney disease, or unspecified chronic kidney disease: Secondary | ICD-10-CM | POA: Diagnosis not present

## 2021-07-26 DIAGNOSIS — E1122 Type 2 diabetes mellitus with diabetic chronic kidney disease: Secondary | ICD-10-CM

## 2021-07-26 DIAGNOSIS — I10 Essential (primary) hypertension: Secondary | ICD-10-CM | POA: Diagnosis not present

## 2021-07-26 DIAGNOSIS — N183 Chronic kidney disease, stage 3 unspecified: Secondary | ICD-10-CM | POA: Diagnosis not present

## 2021-08-04 ENCOUNTER — Telehealth: Payer: Self-pay | Admitting: Family Medicine

## 2021-08-04 NOTE — Telephone Encounter (Signed)
Letter from [facility] having multiple issues with current prescriptions. Per PCP, recommend office visit within the next 60 days to discuss. Contacted pt to schedule appt. Pt will need to call us back for appt. Letter at nurse station to be kept until appt .  ?

## 2021-08-06 ENCOUNTER — Ambulatory Visit (HOSPITAL_COMMUNITY): Payer: HMO | Attending: Family Medicine | Admitting: Physical Therapy

## 2021-08-06 DIAGNOSIS — R296 Repeated falls: Secondary | ICD-10-CM | POA: Insufficient documentation

## 2021-08-06 DIAGNOSIS — M6281 Muscle weakness (generalized): Secondary | ICD-10-CM | POA: Diagnosis not present

## 2021-08-06 NOTE — Therapy (Signed)
?OUTPATIENT PHYSICAL THERAPY NEURO EVALUATION ? ? ?Patient Name: Kristen Mack ?MRN: [id number] ?DOB:09-17-1945, 76 y.o., female ?Today's Date: 08/06/2021 ? ?PCP: Sallee Lange  ?REFERRING PROVIDER: Sallee Lange  ? ? PT End of Session - 08/06/21 1530   ? ? Visit Number 1   ? Number of Visits 12   ? Date for PT Re-Evaluation 09/17/21   ? Authorization Type [employer]   ? Progress Note Due on Visit 10   ? PT Start Time 1535   ? PT Stop Time 1610   ? PT Time Calculation (min) 35 min   ? Activity Tolerance Patient tolerated treatment well   ? Behavior During Therapy [facility] for tasks assessed/performed   ? ?  ?  ? ?  ? ? ?Past Medical History:  ?Diagnosis Date  ? ADHD (attention deficit hyperactivity disorder) 2007  ? Allergy 1964  ? Arthritis   ? Asthma   ? Cataract   ? CFS (chronic fatigue syndrome)   ? Chronic kidney disease 2021  ? Cracked tooth   ? X2  RIGHT LOWER MOLAR AND LEFT UPPER MOLAR  ? Depression   ? Diabetes mellitus without complication ([place])   ? Fatty liver disease, nonalcoholic   ? Fibromyalgia   ? GERD (gastroesophageal reflux disease)   ? History of adenomatous polyp of colon   ? History of cardiac murmur   ? until age 79- pt has had echocardiograms  ? History of hiatal hernia 2021  ? small hiatal hernia was seen on scan September 2021  ? History of TIA (transient ischemic attack)   ? per CT scan   ? Hyperlipidemia   ? Hypertension   ? Hypothyroidism   ? IBS (irritable bowel syndrome)   ? Meniere disease   ? OSA (obstructive sleep apnea) 07/11/2017  ? pt uses CPAP  ? Osteopenia   ? PMB (postmenopausal bleeding)   ? PVC's (premature ventricular contractions)   ? Stroke [facility]   ? Tooth loose   ? LOWER FRONT  ? ?Past Surgical History:  ?Procedure Laterality Date  ? [place]  ? CHOLECYSTECTOMY  1995  ? and  REPAIR UMBILICAL HERNIA  ? COLONOSCOPY N/A 04/19/2013  ? Procedure: COLONOSCOPY;  Surgeon: Rogene Houston, MD;  [facility];  Service: Endoscopy;   Laterality: N/A;    ? ESOPHAGOGASTRODUODENOSCOPY  02/08/2003  ? HERNIA REPAIR  1995  ? HYSTEROSCOPY WITH D & C  11/14/2002  ? endometrial polypectomy  ? HYSTEROSCOPY WITH D & C N/A 04/05/2014  ? Procedure: DILATATION AND CURETTAGE /HYSTEROSCOPY, ENDOMETRIAL POLYPECTOMY;  Surgeon: Sanjuana Kava, MD;  Location: [place];  Service: Gynecology;  Laterality: N/A;  ? LIVER BIOPSY  01/29/2004  ? benign  ? TUBAL LIGATION  1979  ? ?Patient Active Problem List  ? Diagnosis Date Noted  ? Trochanteric bursitis 06/05/2021  ? Bruising tendency 06/30/2020  ? Mixed hyperlipidemia 02/13/2020  ? Diabetes mellitus without complication ([place]) 50/93/2671  ? Sleep apnea 07/11/2017  ? Pancreatic pseudocyst 05/10/2017  ? Adrenal adenoma 05/10/2017  ? Osteoporosis 11/21/2016  ? Fibromyalgia 08/10/2016  ? Female stress incontinence 11/05/2015  ? NASH (nonalcoholic steatohepatitis) 04/24/2015  ? Orthostatic hypotension 09/19/2013  ? Elevated transaminase level 09/19/2013  ? HTN (hypertension), benign 09/03/2013  ? Depression 08/14/2013  ? Fatty liver 08/14/2013  ? Other specified hypothyroidism 10/23/2012  ? Osteopenia 07/08/2012  ? Morbid obesity ([place]) 07/08/2012  ? Urinary bladder incontinence 04/30/2012  ? Family  history of rectal cancer 10/04/2011  ? GERD (gastroesophageal reflux disease) 10/04/2011  ? IBS (irritable bowel syndrome) 10/04/2011  ? ? ?ONSET DATE: 04/07/2021 ? ?REFERRING DIAG: Muscle weakness with decreased balance ? ?THERAPY DIAG:  ?Muscle weakness (generalized) ? ?Repeated falls ? ?SUBJECTIVE:  ?                                                                                                                                                                                           ? ?SUBJECTIVE STATEMENT:    Kristen Mack states that she feels that she is weak due to the fact that she is sitting so much due to her back pain.  She has fallen several times but she was not using her walker at these times.   ? ?Pt  accompanied by: self ? ?PERTINENT HISTORY:Type 2 DM with CKD stage 3 and hypertension (HCC  ? ?IMPRESSION: ?  PAIN: (we are not seeing pt for her chronic back pain and will not be addressing) ?Are you having pain? Yes: Pain location: low back sitting 2-3 moving increases to a 7-8  ?Pain description: dull ache  ?Aggravating factors: weight bearing  ?Relieving factors: sitting  ? ?PRECAUTIONS: None ? ?WEIGHT BEARING RESTRICTIONS No ? ?FALLS: Has patient fallen in last 6 months? Yes. Number of falls 2 ? ?LIVING ENVIRONMENT: ?Lives with: lives with their family ?Lives in: House/apartment ?Stairs: Yes: External: 5 steps; on right going up goes up steps sideways  ?Has following equipment at home: Single point cane; four wheeled walker  ? ?PLOF: Independent with household mobility with device ? ?PATIENT GOALS to have less back pain, improved balance  ? ?OBJECTIVE:  ? ? ?COGNITION: ?Overall cognitive status: Within functional limits for tasks assessed ?  ?SENSATION: ?WFL ?MMT:   ? ?MMT Right ?08/06/2021 Left ?08/06/2021  ?Hip flexion 3-/5 2+/5  ?Hip extension 3-/5 2/5  ?Hip abduction 3/5 4/5   ?Hip adduction    ?Hip internal rotation    ?Hip external rotation    ?Knee flexion 3+/5 3+/5   ?Knee extension 4/5 3/5  ?Ankle dorsiflexion 3/5 3/5   ?Ankle plantarflexion    ?Ankle inversion    ?Ankle eversion    ?(Blank rows = not tested) ? ? ?FUNCTIONAL TESTs:  ?30 seconds chair stand test 10 x  ?Single leg stance unable  ?           2 minute walking test with 4 wheeled walker 232 ft  ? ?TODAY'S TREATMENT:  ?Sitting: ?Sit to stand x 10 ?LAQ LT x 10  ?Resisted DF with green t band x 10  ?Ab set x 5  ? ? ?  PATIENT EDUCATION: ?Education details: HEP ?Person educated: Patient ?Education method: Explanation ?Education comprehension: verbalized understanding ? ? ?HOME EXERCISE PROGRAM: ? ?Sit to stand x 10 ?LAQ LT x 10  ?Resisted DF with green t band x 10  ?Ab set x 5  ? ? ? ?GOALS: ?Goals reviewed with patient? No ? ?SHORT TERM GOALS:  Target date: 08/27/2021  ? ?PT I in HEP to be able to come sit to stand I  ?Baseline: ?Goal status: INITIAL ? ?2.  PT to be able to single leg stance for 5 seconds B to decrease risk of falling  ?Baseline:  ?Goal status: INITIAL ? ? ? ?LONG TERM GOALS: Target date: 09/17/2021    ? ?PT to be I in advanced HEP to improve mm one grade to be able to ascend/descend 4 steps in a normal fashion holding ?     Onto a rail  ?Baseline:  ?Goal status: INITIAL ? ?2.  PT to be able to single leg stance for at least 10 seconds to decrease risk of falls  ?Baseline:  ?Goal status: INITIAL ? ?3.  PT to be able to come sit to stand 12 x in 30 seconds to be at average for age  ?Baseline:  ?Goal status: INITIAL ? ? ? ?ASSESSMENT: ? ?CLINICAL IMPRESSION: ?Patient is a 76 y.o. female who was seen today for physical therapy evaluation and treatment for LE weakness and history of falling.   Evaluation demonstrates decreased core and LE strength as well as decreased balance.  Kristen Mack will benefit from skilled PT to address these deficits and decrease her risk of falling.  ? ? ?OBJECTIVE IMPAIRMENTS decreased activity tolerance, decreased balance, decreased mobility, difficulty walking, decreased strength, and pain.  ? ?ACTIVITY LIMITATIONS cleaning and community activity.  ? ?PERSONAL FACTORS Age, Fitness, and 1-2 comorbidities: DM, HTN, CKD  are also affecting patient's functional outcome.  ? ? ?REHAB POTENTIAL: Good ? ?CLINICAL DECISION MAKING: Stable/uncomplicated ? ?EVALUATION COMPLEXITY: Moderate ? ?PLAN: ?PT FREQUENCY: 2x/week ? ?PT DURATION: 6 weeks ? ?PLANNED INTERVENTIONS: Therapeutic exercises, Therapeutic activity, Neuromuscular re-education, Balance training, Gait training, and Patient/Family education ? ?PLAN FOR NEXT SESSION: Progress core, LE and balance exercises  ? ?Rayetta Humphrey, PT CLT ?[phone]  ?08/06/2021, 4:32 PM ? ? ? ? ? ?  ?

## 2021-08-10 ENCOUNTER — Ambulatory Visit (INDEPENDENT_AMBULATORY_CARE_PROVIDER_SITE_OTHER): Payer: HMO | Admitting: "Endocrinology

## 2021-08-10 ENCOUNTER — Encounter: Payer: Self-pay | Admitting: "Endocrinology

## 2021-08-10 VITALS — BP 158/88 | HR 56 | Ht 59.0 in | Wt 212.8 lb

## 2021-08-10 DIAGNOSIS — N183 Chronic kidney disease, stage 3 unspecified: Secondary | ICD-10-CM

## 2021-08-10 DIAGNOSIS — I129 Hypertensive chronic kidney disease with stage 1 through stage 4 chronic kidney disease, or unspecified chronic kidney disease: Secondary | ICD-10-CM | POA: Diagnosis not present

## 2021-08-10 DIAGNOSIS — E1122 Type 2 diabetes mellitus with diabetic chronic kidney disease: Secondary | ICD-10-CM

## 2021-08-10 DIAGNOSIS — E038 Other specified hypothyroidism: Secondary | ICD-10-CM | POA: Diagnosis not present

## 2021-08-10 MED ORDER — FREESTYLE LIBRE 2 READER DEVI: 0 refills | Status: DC

## 2021-08-10 MED ORDER — FREESTYLE LIBRE 2 SENSOR MISC: 1.0000 | 3 refills | Status: DC

## 2021-08-10 MED ORDER — ONETOUCH VERIO VI STRP: ORAL_STRIP | 3 refills | Status: DC

## 2021-08-10 NOTE — Patient Instructions (Signed)

## 2021-08-10 NOTE — Progress Notes (Signed)
? ?    ?                                           08/10/2021, 5:45 PM ?    ?Endocrinology follow-up note ? ? ?Subjective:  ? ? Patient ID: Kristen Mack, female    DOB: [birth year], PCP Kathyrn Drown, MD ? ? ?Past Medical History:  ?Diagnosis Date  ? ADHD (attention deficit hyperactivity disorder) 2007  ? Allergy 1964  ? Arthritis   ? Asthma   ? Cataract   ? CFS (chronic fatigue syndrome)   ? Chronic kidney disease 2021  ? Cracked tooth   ? X2  RIGHT LOWER MOLAR AND LEFT UPPER MOLAR  ? Depression   ? Diabetes mellitus without complication ([place])   ? Fatty liver disease, nonalcoholic   ? Fibromyalgia   ? GERD (gastroesophageal reflux disease)   ? History of adenomatous polyp of colon   ? History of cardiac murmur   ? until age 16- pt has had echocardiograms  ? History of hiatal hernia 2021  ? small hiatal hernia was seen on scan September 2021  ? History of TIA (transient ischemic attack)   ? per CT scan   ? Hyperlipidemia   ? Hypertension   ? Hypothyroidism   ? IBS (irritable bowel syndrome)   ? Meniere disease   ? OSA (obstructive sleep apnea) 07/11/2017  ? pt uses CPAP  ? Osteopenia   ? PMB (postmenopausal bleeding)   ? PVC's (premature ventricular contractions)   ? Stroke [facility])   ? Tooth loose   ? LOWER FRONT  ? ?Past Surgical History:  ?Procedure Laterality Date  ? [place]  ? CHOLECYSTECTOMY  1995  ? and  REPAIR UMBILICAL HERNIA  ? COLONOSCOPY N/A 04/19/2013  ? Procedure: COLONOSCOPY;  Surgeon: Rogene Houston, MD;  [facility];  Service: Endoscopy;  Laterality: N/A;    ? ESOPHAGOGASTRODUODENOSCOPY  02/08/2003  ? HERNIA REPAIR  1995  ? HYSTEROSCOPY WITH D & C  11/14/2002  ? endometrial polypectomy  ? HYSTEROSCOPY WITH D & C N/A 04/05/2014  ? Procedure: DILATATION AND CURETTAGE /HYSTEROSCOPY, ENDOMETRIAL POLYPECTOMY;  Surgeon: Sanjuana Kava, MD;  Location: [place];  Service: Gynecology;  Laterality: N/A;  ? LIVER BIOPSY  01/29/2004  ? benign  ? TUBAL  LIGATION  1979  ? ?Social History  ? ?Socioeconomic History  ? Marital status: Married  ?  Spouse name: Herbie Baltimore  ? Number of children: 2  ? Years of education: Not on file  ? Highest education level: Not on file  ?Occupational History  ? Occupation: retired, Therapist, sports  ?Tobacco Use  ? Smoking status: Former  ?  Packs/day: 1.50  ?  Years: 3.00  ?  Pack years: 4.50  ?  Types: Cigarettes  ?  Quit date: 10/03/1968  ?  Years since quitting: 52.8  ? Smokeless tobacco: Never  ?Vaping Use  ? Vaping Use: Never used  ?Substance and Sexual Activity  ? Alcohol use: No  ? Drug use: No  ? Sexual activity: Not Currently  ?  Birth control/protection: None  ?  Comment: Husband has ED  ?Other Topics Concern  ? Not on file  ?Social History Narrative  ? 2 daughters, Amy and Sharyn Lull. Amy lives with parents. Sharyn Lull lives in [place].  ? ?Social  Determinants of Health  ? ?Financial Resource Strain: Low Risk   ? Difficulty of Paying Living Expenses: Not hard at all  ?Food Insecurity: No Food Insecurity  ? Worried About Charity fundraiser in the Last Year: Never true  ? Ran Out of Food in the Last Year: Never true  ?Transportation Needs: No Transportation Needs  ? Lack of Transportation (Medical): No  ? Lack of Transportation (Non-Medical): No  ?Physical Activity: Insufficiently Active  ? Days of Exercise per Week: 3 days  ? Minutes of Exercise per Session: 20 min  ?Stress: No Stress Concern Present  ? Feeling of Stress : Not at all  ?Social Connections: Socially Integrated  ? Frequency of Communication with Friends and Family: More than three times a week  ? Frequency of Social Gatherings with Friends and Family: More than three times a week  ? Attends Religious Services: 1 to 4 times per year  ? Active Member of Clubs or Organizations: Yes  ? Attends Archivist Meetings: 1 to 4 times per year  ? Marital Status: Married  ? ?Outpatient Encounter Medications as of 08/10/2021  ?Medication Sig  ? Continuous Blood Gluc Receiver (FREESTYLE  LIBRE 2 READER) DEVI As directed  ? Continuous Blood Gluc Sensor (FREESTYLE LIBRE 2 SENSOR) MISC 1 Piece by Does not apply route every 14 (fourteen) days.  ? albuterol (VENTOLIN HFA) 108 (90 Base) MCG/ACT inhaler INHALE 2 PUFFS INTO LUNGS EVERY 6 HOURS AS NEEDED FOR SHORTNESS OF BREATH  ? Ascorbic Acid (VITAMIN C) 1000 MG tablet Take 6,000 mg by mouth daily.   ? Azelastine HCl 137 MCG/SPRAY SOLN USE 2 SPRAY(S) IN EACH NOSTRIL TWICE DAILY  ? B Complex Vitamins (B COMPLEX 100 PO) Take 1 tablet by mouth daily.  ? blood glucose meter kit and supplies Dispense based on patient and insurance preference. Use to test sugars BID  (FOR ICD-10 E10.9, E11.9).  ? buPROPion (WELLBUTRIN XL) 300 MG 24 hr tablet Take 1 tablet (300 mg total) by mouth daily.  ? Chromium 1000 MCG TABS Take 1,000 mcg by mouth daily.  ? Coenzyme Q10 (COQ10) 200 MG CAPS Take 400 mg by mouth daily.  ? Cyanocobalamin (B-12) 1000 MCG SUBL Place 1,000 mcg under the tongue daily.  ? DIGESTIVE ENZYMES PO Take 1 tablet by mouth 3 (three) times daily.   ? diltiazem (CARDIZEM CD) 120 MG 24 hr capsule Take 1 capsule by mouth once daily  ? empagliflozin (JARDIANCE) 10 MG TABS tablet Take 1 tablet (10 mg total) by mouth daily before breakfast.  ? glucose blood (ONETOUCH VERIO) test strip Use to test blood sugars four times  daily  ? insulin detemir (LEVEMIR FLEXPEN) 100 UNIT/ML FlexPen Inject 50 Units into the skin at bedtime.  ? ipratropium (ATROVENT) 0.02 % nebulizer solution USE ONE VIAL IN NEBULIZER 4 TIMES DAILY AS NEEDED FOR SHORTNESS OF BREATH  ? levothyroxine (SYNTHROID) 125 MCG tablet TAKE 1 TABLET BY MOUTH DAILY BEFORE BREAKFAST  ? Lysine HCl 500 MG TABS Take 500 mg by mouth daily.  ? Magnesium 250 MG TABS Take 250 mg by mouth 2 (two) times daily.  ? meclizine (ANTIVERT) 25 MG tablet Take 25 mg by mouth 3 (three) times daily as needed for dizziness.  ? montelukast (SINGULAIR) 10 MG tablet TAKE 1 TABLET BY MOUTH AT BEDTIME  ? OVER THE COUNTER MEDICATION Take  1 Scoop by mouth 3 (three) times daily with meals. Fennel seeds  ? OVER THE COUNTER MEDICATION Take 1 tablet  by mouth at bedtime as needed (sleep). Sleep essentials  ? rosuvastatin (CRESTOR) 5 MG tablet TAKE 1 TABLET BY MOUTH ON MONDAY, WEDNESDAY AND FRIDAY  ? TART CHERRY PO Take 1 tablet by mouth in the morning, at noon, and at bedtime.  ? tiZANidine (ZANAFLEX) 2 MG tablet 1 qhs prn caution drowsiness  ? Vitamin A 3 MG (10000 UT) TABS Take 10,000 Units by mouth daily.  ? vitamin E 180 MG (400 UNITS) capsule Take 800 Units by mouth daily.   ? [DISCONTINUED] glucose blood (ONETOUCH VERIO) test strip Use to test blood sugars twice daily  ? ?No facility-administered encounter medications on file as of 08/10/2021.  ? ?ALLERGIES: ?Allergies  ?Allergen Reactions  ? Ambien [Zolpidem Tartrate] Other (See Comments)  ?  Side effect "lost a couple days"; memory loss  ? Cefzil [Cefprozil] Diarrhea  ? Codeine Itching and Nausea And Vomiting  ? Demerol [Meperidine] Other (See Comments)  ?  "messes up senses"  ? Formaldehyde   ? Latex Other (See Comments)  ?  "skin gets raw"  ? Sulfa Antibiotics Other (See Comments)  ?  Severe abd cramp  ? Celexa [Citalopram Hydrobromide] Other (See Comments)  ?  Mouth sores  ? ? ?VACCINATION STATUS: ?Immunization History  ?Administered Date(s) Administered  ? Fluad Quad(high Dose 65+) 01/22/2020, 02/10/2021  ? Influenza, High Dose Seasonal PF 04/15/2018, 01/13/2019  ? Influenza,inj,Quad PF,6+ Mos 03/02/2016, 03/03/2017  ? Influenza-Unspecified 04/15/2018, 01/15/2019  ? Moderna Sars-Covid-2 Vaccination 05/23/2019, 06/19/2019, 03/05/2020  ? Pneumococcal Conjugate-13 05/22/2014  ? Pneumococcal Polysaccharide-23 03/03/2017  ? Tdap 04/18/2020  ? Zoster Recombinat (Shingrix) 12/08/2018, 04/27/2019  ? ? ?HPI ?MARTASIA TALAMANTE is 76 y.o. female who is accompanied by her daughter to clinic.  She usually follows in this clinic for stable, nonfunctioning 1.5 cm left adrenal adenoma which was previously  worked up completely.  ?She is also in follow up for hypothyroidism. ?Recently, she lost control of her diabetes .  During her last visit, she was put on Levemir 50 units nightly along with Vania Rea

## 2021-08-11 DIAGNOSIS — E1122 Type 2 diabetes mellitus with diabetic chronic kidney disease: Secondary | ICD-10-CM | POA: Diagnosis not present

## 2021-08-12 ENCOUNTER — Encounter (HOSPITAL_BASED_OUTPATIENT_CLINIC_OR_DEPARTMENT_OTHER): Payer: Self-pay | Admitting: Obstetrics & Gynecology

## 2021-08-12 ENCOUNTER — Telehealth: Payer: HMO

## 2021-08-12 ENCOUNTER — Ambulatory Visit (HOSPITAL_COMMUNITY): Payer: HMO | Admitting: Physical Therapy

## 2021-08-12 DIAGNOSIS — E1122 Type 2 diabetes mellitus with diabetic chronic kidney disease: Secondary | ICD-10-CM | POA: Diagnosis not present

## 2021-08-13 ENCOUNTER — Encounter (HOSPITAL_COMMUNITY)
Admission: RE | Admit: 2021-08-13 | Discharge: 2021-08-13 | Disposition: A | Discharge status: Home / Self Care | Payer: HMO | Source: Ambulatory Visit | Attending: Obstetrics & Gynecology | Admitting: Obstetrics & Gynecology

## 2021-08-13 ENCOUNTER — Encounter (HOSPITAL_BASED_OUTPATIENT_CLINIC_OR_DEPARTMENT_OTHER): Payer: Self-pay | Admitting: Obstetrics & Gynecology

## 2021-08-13 VITALS — BP 145/93 | HR 67 | Temp 98.1°F | Resp 18 | Ht 59.0 in | Wt 214.0 lb

## 2021-08-13 DIAGNOSIS — I129 Hypertensive chronic kidney disease with stage 1 through stage 4 chronic kidney disease, or unspecified chronic kidney disease: Secondary | ICD-10-CM | POA: Insufficient documentation

## 2021-08-13 DIAGNOSIS — E1122 Type 2 diabetes mellitus with diabetic chronic kidney disease: Secondary | ICD-10-CM | POA: Diagnosis not present

## 2021-08-13 DIAGNOSIS — N183 Chronic kidney disease, stage 3 unspecified: Secondary | ICD-10-CM | POA: Insufficient documentation

## 2021-08-13 DIAGNOSIS — Z01812 Encounter for preprocedural laboratory examination: Secondary | ICD-10-CM | POA: Insufficient documentation

## 2021-08-13 LAB — CBC WITH DIFFERENTIAL/PLATELET
Abs Immature Granulocytes: 0.03 10*3/uL (ref 0.00–0.07)
Basophils Absolute: 0.1 10*3/uL (ref 0.0–0.1)
Basophils Relative: 1 %
Eosinophils Absolute: 0.3 10*3/uL (ref 0.0–0.5)
Eosinophils Relative: 3 %
HCT: 38.4 % (ref 36.0–46.0)
Hemoglobin: 12.5 g/dL (ref 12.0–15.0)
Immature Granulocytes: 0 %
Lymphocytes Relative: 18 %
Lymphs Abs: 1.5 10*3/uL (ref 0.7–4.0)
MCH: 27.7 pg (ref 26.0–34.0)
MCHC: 32.6 g/dL (ref 30.0–36.0)
MCV: 85.1 fL (ref 80.0–100.0)
Monocytes Absolute: 0.7 10*3/uL (ref 0.1–1.0)
Monocytes Relative: 9 %
Neutro Abs: 5.7 10*3/uL (ref 1.7–7.7)
Neutrophils Relative %: 69 %
Platelets: 337 10*3/uL (ref 150–400)
RBC: 4.51 MIL/uL (ref 3.87–5.11)
RDW: 15.1 % (ref 11.5–15.5)
WBC: 8.2 10*3/uL (ref 4.0–10.5)
nRBC: 0 % (ref 0.0–0.2)

## 2021-08-13 LAB — BASIC METABOLIC PANEL
Anion gap: 10 (ref 5–15)
BUN: 24 mg/dL — ABNORMAL HIGH (ref 8–23)
CO2: 23 mmol/L (ref 22–32)
Calcium: 9.2 mg/dL (ref 8.9–10.3)
Chloride: 106 mmol/L (ref 98–111)
Creatinine, Ser: 1.62 mg/dL — ABNORMAL HIGH (ref 0.44–1.00)
GFR, Estimated: 33 mL/min — ABNORMAL LOW (ref >60)
Glucose, Bld: 183 mg/dL — ABNORMAL HIGH (ref 70–99)
Potassium: 3.1 mmol/L — ABNORMAL LOW (ref 3.5–5.1)
Sodium: 139 mmol/L (ref 135–145)

## 2021-08-14 ENCOUNTER — Encounter (HOSPITAL_BASED_OUTPATIENT_CLINIC_OR_DEPARTMENT_OTHER): Payer: Self-pay | Admitting: Obstetrics & Gynecology

## 2021-08-14 ENCOUNTER — Other Ambulatory Visit: Payer: Self-pay

## 2021-08-14 NOTE — Progress Notes (Addendum)
Spoke w/ via phone for pre-op interview--- pt Lab needs dos----  no           Lab results------ pt had CBCdiff, BMP, T&S done 08-13-2021 results in epic;  also current ekg in epic/ chart COVID test -----patient states asymptomatic no test needed Arrive at ------- 1200 on 08-17-2021 NPO after MN NO Solid Food.  Clear liquids from MN until--- 1100 Med rec completed Medications to take morning of surgery ----- synthroid, wellbutrin, nasal spray Diabetic medication ----- do not take jardiance morning of surgery and do half dose levemir insulin night before surgery Patient instructed no nail polish to be worn day of surgery Patient instructed to bring photo id and insurance card day of surgery Patient aware to have Driver (ride ) / caregiver  for 24 hours after surgery --daughter, amy Patient Special Instructions ----- asked to bring rescue inhaler dos Pre-Op special Istructions ----- pt has cardiac office note clearance by Dr Viona Gilmore. O'Meal dated 04-06-2021 in epic/ chart.  Pt stated no changes since cardiology visit. Patient verbalized understanding of instructions that were given at this phone interview. Patient denies shortness of breath, chest pain, fever, cough at this phone interview.    Anesthesia Review:  HTN;  Asthma (last used nebulizer 3 months ago);  OSA uses cpap nightly;  Hx TIA per imaging;  DM2;  CKD 3; Meniere's Pt denies cardiac/ stroke s&s, peripheral edema BLE, and sob w/ stairs/ long distance,  household chores ok.  All notes / results in epic PCP:  Dr Chauncey Cruel. [place] (07-16-2021 ) Cardiologist : Dr Audie Box Cassell Clement 04-06-2021 ) Chest x-ray :  04-26-2017 EKG : 04-06-2021 Echo : 02-01-2020 Stress test: no Cardiac Cath : no Activity level: see abowe Sleep Study/ CPAP : Yes/ Yes Fasting Blood Sugar : 73--150     / Checks Blood Sugar -- times a day:  QID Blood Thinner/ Instructions /Last Dose:  no ASA / Instructions/ Last Dose :  no

## 2021-08-17 ENCOUNTER — Encounter (HOSPITAL_BASED_OUTPATIENT_CLINIC_OR_DEPARTMENT_OTHER)
Admission: RE | Disposition: A | Discharge status: Home / Self Care | Payer: Self-pay | Source: Home / Self Care | Attending: Obstetrics & Gynecology

## 2021-08-17 ENCOUNTER — Ambulatory Visit (HOSPITAL_BASED_OUTPATIENT_CLINIC_OR_DEPARTMENT_OTHER): Payer: HMO | Admitting: Anesthesiology

## 2021-08-17 ENCOUNTER — Ambulatory Visit (HOSPITAL_BASED_OUTPATIENT_CLINIC_OR_DEPARTMENT_OTHER)
Admission: RE | Admit: 2021-08-17 | Discharge: 2021-08-17 | Disposition: A | Discharge status: Home / Self Care | Payer: HMO | Attending: Obstetrics & Gynecology | Admitting: Obstetrics & Gynecology

## 2021-08-17 ENCOUNTER — Other Ambulatory Visit: Payer: Self-pay

## 2021-08-17 ENCOUNTER — Encounter (HOSPITAL_BASED_OUTPATIENT_CLINIC_OR_DEPARTMENT_OTHER): Payer: Self-pay | Admitting: Obstetrics & Gynecology

## 2021-08-17 DIAGNOSIS — Z6841 Body Mass Index (BMI) 40.0 and over, adult: Secondary | ICD-10-CM | POA: Diagnosis not present

## 2021-08-17 DIAGNOSIS — E039 Hypothyroidism, unspecified: Secondary | ICD-10-CM | POA: Diagnosis not present

## 2021-08-17 DIAGNOSIS — R9389 Abnormal findings on diagnostic imaging of other specified body structures: Secondary | ICD-10-CM

## 2021-08-17 DIAGNOSIS — Z01818 Encounter for other preprocedural examination: Secondary | ICD-10-CM

## 2021-08-17 DIAGNOSIS — E1122 Type 2 diabetes mellitus with diabetic chronic kidney disease: Secondary | ICD-10-CM | POA: Insufficient documentation

## 2021-08-17 DIAGNOSIS — Z8673 Personal history of transient ischemic attack (TIA), and cerebral infarction without residual deficits: Secondary | ICD-10-CM | POA: Insufficient documentation

## 2021-08-17 DIAGNOSIS — G4733 Obstructive sleep apnea (adult) (pediatric): Secondary | ICD-10-CM | POA: Diagnosis not present

## 2021-08-17 DIAGNOSIS — N952 Postmenopausal atrophic vaginitis: Secondary | ICD-10-CM | POA: Diagnosis not present

## 2021-08-17 DIAGNOSIS — Z794 Long term (current) use of insulin: Secondary | ICD-10-CM | POA: Diagnosis not present

## 2021-08-17 DIAGNOSIS — G4730 Sleep apnea, unspecified: Secondary | ICD-10-CM | POA: Diagnosis not present

## 2021-08-17 DIAGNOSIS — J45909 Unspecified asthma, uncomplicated: Secondary | ICD-10-CM | POA: Insufficient documentation

## 2021-08-17 DIAGNOSIS — Z9989 Dependence on other enabling machines and devices: Secondary | ICD-10-CM | POA: Diagnosis not present

## 2021-08-17 DIAGNOSIS — N8501 Benign endometrial hyperplasia: Secondary | ICD-10-CM | POA: Diagnosis not present

## 2021-08-17 DIAGNOSIS — N183 Chronic kidney disease, stage 3 unspecified: Secondary | ICD-10-CM | POA: Insufficient documentation

## 2021-08-17 DIAGNOSIS — K7581 Nonalcoholic steatohepatitis (NASH): Secondary | ICD-10-CM | POA: Diagnosis not present

## 2021-08-17 DIAGNOSIS — F32A Depression, unspecified: Secondary | ICD-10-CM | POA: Diagnosis not present

## 2021-08-17 DIAGNOSIS — I129 Hypertensive chronic kidney disease with stage 1 through stage 4 chronic kidney disease, or unspecified chronic kidney disease: Secondary | ICD-10-CM | POA: Insufficient documentation

## 2021-08-17 DIAGNOSIS — N840 Polyp of corpus uteri: Secondary | ICD-10-CM | POA: Insufficient documentation

## 2021-08-17 DIAGNOSIS — Z87891 Personal history of nicotine dependence: Secondary | ICD-10-CM | POA: Diagnosis not present

## 2021-08-17 DIAGNOSIS — Z7984 Long term (current) use of oral hypoglycemic drugs: Secondary | ICD-10-CM | POA: Diagnosis not present

## 2021-08-17 DIAGNOSIS — I10 Essential (primary) hypertension: Secondary | ICD-10-CM

## 2021-08-17 DIAGNOSIS — E119 Type 2 diabetes mellitus without complications: Secondary | ICD-10-CM

## 2021-08-17 HISTORY — DX: Long term (current) use of insulin: Z79.4

## 2021-08-17 HISTORY — PX: CERVICAL POLYPECTOMY: SHX88

## 2021-08-17 HISTORY — DX: Presence of dental prosthetic device (complete) (partial): Z97.2

## 2021-08-17 HISTORY — DX: Type 2 diabetes mellitus without complications: Z79.4

## 2021-08-17 HISTORY — DX: Localized edema: R60.0

## 2021-08-17 HISTORY — DX: Diaphragmatic hernia without obstruction or gangrene: K44.9

## 2021-08-17 HISTORY — DX: Presence of spectacles and contact lenses: Z97.3

## 2021-08-17 HISTORY — DX: Benign neoplasm of left adrenal gland: D35.02

## 2021-08-17 HISTORY — DX: Chronic kidney disease, stage 3 unspecified: N18.30

## 2021-08-17 HISTORY — DX: Cyst of pancreas: K86.2

## 2021-08-17 HISTORY — DX: Unspecified urinary incontinence: R32

## 2021-08-17 HISTORY — PX: DILITATION & CURRETTAGE/HYSTROSCOPY WITH NOVASURE ABLATION: SHX5568

## 2021-08-17 HISTORY — DX: Type 2 diabetes mellitus without complications: E11.9

## 2021-08-17 LAB — POCT I-STAT, CHEM 8
BUN: 21 mg/dL (ref 8–23)
Calcium, Ion: 1.29 mmol/L (ref 1.15–1.40)
Chloride: 107 mmol/L (ref 98–111)
Creatinine, Ser: 1.7 mg/dL — ABNORMAL HIGH (ref 0.44–1.00)
Glucose, Bld: 120 mg/dL — ABNORMAL HIGH (ref 70–99)
HCT: 41 % (ref 36.0–46.0)
Hemoglobin: 13.9 g/dL (ref 12.0–15.0)
Potassium: 3.5 mmol/L (ref 3.5–5.1)
Sodium: 143 mmol/L (ref 135–145)
TCO2: 24 mmol/L (ref 22–32)

## 2021-08-17 LAB — TYPE AND SCREEN
ABO/RH(D): A POS
Antibody Screen: NEGATIVE

## 2021-08-17 LAB — GLUCOSE, CAPILLARY: Glucose-Capillary: 117 mg/dL — ABNORMAL HIGH (ref 70–99)

## 2021-08-17 SURGERY — DILATATION & CURETTAGE/HYSTEROSCOPY WITH NOVASURE ABLATION
Anesthesia: General | Site: Uterus

## 2021-08-17 MED ORDER — GENTAMICIN SULFATE 40 MG/ML IJ SOLN
330.0000 mg | INTRAVENOUS | Status: AC
  Administered 2021-08-17: 330 mg via INTRAVENOUS
  Filled 2021-08-17 (×2): qty 8.25

## 2021-08-17 MED ORDER — ACETAMINOPHEN 500 MG PO TABS
1000.0000 mg | ORAL_TABLET | Freq: Once | ORAL | Status: AC
  Administered 2021-08-17: 1000 mg via ORAL

## 2021-08-17 MED ORDER — KETOROLAC TROMETHAMINE 30 MG/ML IJ SOLN: 30.0000 mg | Freq: Once | INTRAMUSCULAR | Status: DC

## 2021-08-17 MED ORDER — DEXAMETHASONE SODIUM PHOSPHATE 10 MG/ML IJ SOLN
INTRAMUSCULAR | Status: AC
  Filled 2021-08-17: qty 1

## 2021-08-17 MED ORDER — ACETAMINOPHEN 500 MG PO TABS
ORAL_TABLET | ORAL | Status: AC
  Filled 2021-08-17: qty 2

## 2021-08-17 MED ORDER — PROPOFOL 10 MG/ML IV BOLUS
INTRAVENOUS | Status: DC | PRN
  Administered 2021-08-17: 200 mg via INTRAVENOUS
  Administered 2021-08-17: 50 mg via INTRAVENOUS

## 2021-08-17 MED ORDER — CLINDAMYCIN PHOSPHATE 900 MG/50ML IV SOLN
900.0000 mg | INTRAVENOUS | Status: AC
  Administered 2021-08-17: 900 mg via INTRAVENOUS

## 2021-08-17 MED ORDER — OXYCODONE-ACETAMINOPHEN 5-325 MG PO TABS: 1.0000 | ORAL_TABLET | ORAL | 0 refills | Status: DC | PRN

## 2021-08-17 MED ORDER — FENTANYL CITRATE (PF) 100 MCG/2ML IJ SOLN
INTRAMUSCULAR | Status: AC
  Filled 2021-08-17: qty 2

## 2021-08-17 MED ORDER — FENTANYL CITRATE (PF) 100 MCG/2ML IJ SOLN: 25.0000 ug | INTRAMUSCULAR | Status: DC | PRN

## 2021-08-17 MED ORDER — BUPIVACAINE-EPINEPHRINE 0.25% -1:200000 IJ SOLN
INTRAMUSCULAR | Status: DC | PRN
  Administered 2021-08-17: 20 mL

## 2021-08-17 MED ORDER — POVIDONE-IODINE 10 % EX SWAB: 2.0000 "application " | Freq: Once | CUTANEOUS | Status: DC

## 2021-08-17 MED ORDER — LACTATED RINGERS IR SOLN
Status: DC | PRN
  Administered 2021-08-17: 3000 mL

## 2021-08-17 MED ORDER — LIDOCAINE 2% (20 MG/ML) 5 ML SYRINGE
INTRAMUSCULAR | Status: DC | PRN
  Administered 2021-08-17: 80 mg via INTRAVENOUS

## 2021-08-17 MED ORDER — ONDANSETRON HCL 4 MG/2ML IJ SOLN
INTRAMUSCULAR | Status: DC | PRN
  Administered 2021-08-17: 4 mg via INTRAVENOUS

## 2021-08-17 MED ORDER — PROPOFOL 10 MG/ML IV BOLUS
INTRAVENOUS | Status: AC
  Filled 2021-08-17: qty 20

## 2021-08-17 MED ORDER — SODIUM CHLORIDE 0.9 % IV SOLN: INTRAVENOUS | Status: DC

## 2021-08-17 MED ORDER — FENTANYL CITRATE (PF) 100 MCG/2ML IJ SOLN
INTRAMUSCULAR | Status: DC | PRN
  Administered 2021-08-17 (×2): 50 ug via INTRAVENOUS

## 2021-08-17 MED ORDER — GENTAMICIN SULFATE 40 MG/ML IJ SOLN
5.0000 mg/kg | INTRAVENOUS | Status: DC
  Filled 2021-08-17: qty 12

## 2021-08-17 MED ORDER — PHENYLEPHRINE 80 MCG/ML (10ML) SYRINGE FOR IV PUSH (FOR BLOOD PRESSURE SUPPORT)
PREFILLED_SYRINGE | INTRAVENOUS | Status: DC | PRN
Start: 2021-08-17 — End: 2021-08-17
  Administered 2021-08-17: 80 ug via INTRAVENOUS
  Administered 2021-08-17: 160 ug via INTRAVENOUS

## 2021-08-17 MED ORDER — ONDANSETRON HCL 4 MG/2ML IJ SOLN
INTRAMUSCULAR | Status: AC
  Filled 2021-08-17: qty 2

## 2021-08-17 MED ORDER — CLINDAMYCIN PHOSPHATE 900 MG/50ML IV SOLN
INTRAVENOUS | Status: AC
  Filled 2021-08-17: qty 50

## 2021-08-17 MED ORDER — DEXAMETHASONE SODIUM PHOSPHATE 10 MG/ML IJ SOLN
INTRAMUSCULAR | Status: DC | PRN
Start: 2021-08-17 — End: 2021-08-17
  Administered 2021-08-17: 5 mg via INTRAVENOUS

## 2021-08-17 MED ORDER — MELOXICAM 7.5 MG PO TBDP
1.0000 | ORAL_TABLET | Freq: Two times a day (BID) | ORAL | 3 refills | Status: DC | PRN
Start: 2021-08-17 — End: 2021-10-26

## 2021-08-17 SURGICAL SUPPLY — 20 items
ABLATOR SURESOUND NOVASURE (ABLATOR) ×1 IMPLANT
BIPOLAR CUTTING LOOP 21FR (ELECTRODE)
CATH ROBINSON RED A/P 16FR (CATHETERS) IMPLANT
DEVICE MYOSURE LITE (MISCELLANEOUS) ×1 IMPLANT
DILATOR CANAL MILEX (MISCELLANEOUS) IMPLANT
DRSG TELFA 3X8 NADH (GAUZE/BANDAGES/DRESSINGS) ×2 IMPLANT
GAUZE 4X4 16PLY ~~LOC~~+RFID DBL (SPONGE) ×4 IMPLANT
GAUZE PETROLATUM 1 X8 (GAUZE/BANDAGES/DRESSINGS) IMPLANT
GLOVE BIO SURGEON STRL SZ 6.5 (GLOVE) ×2 IMPLANT
GOWN STRL REUS W/TWL LRG LVL3 (GOWN DISPOSABLE) ×4 IMPLANT
KIT PROCEDURE FLUENT (KITS) ×2 IMPLANT
KIT TURNOVER CYSTO (KITS) ×2 IMPLANT
LOOP CUTTING BIPOLAR 21FR (ELECTRODE) IMPLANT
PACK VAGINAL MINOR WOMEN LF (CUSTOM PROCEDURE TRAY) ×2 IMPLANT
PAD DRESSING TELFA 3X8 NADH (GAUZE/BANDAGES/DRESSINGS) ×1 IMPLANT
PAD OB MATERNITY 4.3X12.25 (PERSONAL CARE ITEMS) ×2 IMPLANT
PAD PREP 24X48 CUFFED NSTRL (MISCELLANEOUS) ×2 IMPLANT
SEAL ROD LENS SCOPE MYOSURE (ABLATOR) ×2 IMPLANT
TOWEL OR 17X26 10 PK STRL BLUE (TOWEL DISPOSABLE) ×4 IMPLANT
WATER STERILE IRR 500ML POUR (IV SOLUTION) ×2 IMPLANT

## 2021-08-17 NOTE — Discharge Instructions (Signed)
  Post Anesthesia Home Care Instructions  Activity: Get plenty of rest for the remainder of the day. A responsible individual must stay with you for 24 hours following the procedure.  For the next 24 hours, DO NOT: -Drive a car -Paediatric nurse -Drink alcoholic beverages -Take any medication unless instructed by your physician -Make any legal decisions or sign important papers.  Meals: Start with liquid foods such as gelatin or soup. Progress to regular foods as tolerated. Avoid greasy, spicy, heavy foods. If nausea and/or vomiting occur, drink only clear liquids until the nausea and/or vomiting subsides. Call your physician if vomiting continues.  Special Instructions/Symptoms: Your throat may feel dry or sore from the anesthesia or the breathing tube placed in your throat during surgery. If this causes discomfort, gargle with warm salt water. The discomfort should disappear within 24 hours.  DISCHARGE INSTRUCTIONS: HYSTEROSCOPY  The following instructions have been prepared to help you care for yourself upon your return home  May take stool softner while taking narcotic pain medication to prevent constipation.  Drink plenty of water.  Personal hygiene:  Use sanitary pads for vaginal drainage, not tampons.  Shower the day after your procedure.  NO tub baths, pools or Jacuzzis for 2-3 weeks.  Wipe front to back after using the bathroom.  Activity and limitations:  Do NOT drive or operate any equipment for 24 hours. The effects of anesthesia are still present and drowsiness may result.  Do NOT rest in bed all day.  Walking is encouraged.  Walk up and down stairs slowly.  You may resume your normal activity in one to two days or as indicated by your physician. Sexual activity: NO intercourse for at least 2 weeks after the procedure, or as indicated by your Doctor.  Diet: Eat a light meal as desired this evening. You may resume your usual diet tomorrow.  Return to Work: You may  resume your work activities in one to two days or as indicated by Marine scientist.  What to expect after your surgery: Expect to have vaginal bleeding/discharge for 2-3 days and spotting for up to 10 days. It is not unusual to have soreness for up to 1-2 weeks. You may have a slight burning sensation when you urinate for the first day. Mild cramps may continue for a couple of days. You may have a regular period in 2-6 weeks.  Call your doctor for any of the following:  Excessive vaginal bleeding or clotting, saturating and changing one pad every hour.  Inability to urinate 6 hours after discharge from hospital.  Pain not relieved by pain medication.  Fever of 100.4 F or greater.  Unusual vaginal discharge or odor.  May take Tylenol beginning at 8 PM as needed for cramping/discomfort.

## 2021-08-17 NOTE — Anesthesia Procedure Notes (Signed)
Procedure Name: LMA Insertion Date/Time: 08/17/2021 2:37 PM Performed by: Rogers Blocker, CRNA Pre-anesthesia Checklist: Patient identified, Emergency Drugs available, Suction available and Patient being monitored Patient Re-evaluated:Patient Re-evaluated prior to induction Oxygen Delivery Method: Circle System Utilized Preoxygenation: Pre-oxygenation with 100% oxygen Induction Type: IV induction Ventilation: Mask ventilation without difficulty LMA: LMA inserted LMA Size: 4.0 Number of attempts: 1 Placement Confirmation: positive ETCO2 Tube secured with: Tape Dental Injury: Teeth and Oropharynx as per pre-operative assessment

## 2021-08-17 NOTE — Op Note (Signed)
OPERATIVE REPORT  Kristen Mack  76 y.o. 08/17/2021  Pre-operative Diagnosis: Thickened endometrium  Post-operative Diagnosis: same  Surgeon: Surgeon(s) and Role:    * Sanjuana Kava, MD - Primary   Assistant:  None  Procedure Procedure(s) and Anesthesia Type:    * DILATATION & CURETTAGE/HYSTEROSCOPY - Choice    * BIOPSY OF ENDOMETRIUM Polypectomy with Myosure.   Anesthesia: General LMA; Paracervical block with Sensorcaine   ASA Class: 2  EBL: 5 mL  UOP: N/A  IVF: 600 mL  Fluid Deficit: 260  Specimen: Endometrial polyp  Findings: Exam under anesthesia: External vulva: atrophic vaginal vault: decreased ruggae moderate atrophy; cervix parous, uterus: slightly enlarged 10 week size.  Hysteroscopy: endometrial polyp with stalk from fundus into endometrium. Once removed endometrial cavity appeared normal and bilateral ostia visualized   Complications: None  After adequate anesthesia was achieved, the patient was prepped and draped in the usual sterile fashion.  The Graves operative speculum was placed in the vagina and the cervix stabilized with a single-tooth tenaculum.  A paracervical block was performed with 10 mL of Sensocaine / Epi. The uterus was sounded to 9cm. The  cervix was dilated with Hank dilators to 12 Fr.  The small 32m  diagnostic hysteroscope was passed inside the endometrial cavity; Normal saline was used as a distending medium. The above findings were noted and sharp curettage was then performed and uterine curettings sent to pathology.     In order to remove the polyp the Myosure scope was then inserted and the Light Myosure was used to remove the polyp without injury to the endometrium. The polyp tissue will be sent to Pathology.  The tenaculum was removed from the cervix.  The cervix was hemostatic.   All instruments were removed from the vagina. Counts were correct.  The patient tolerated the procedure well and was transferred to the recovery room in  good condition.  WSanjuana Kava MD  08/17/2021 3:30 PM

## 2021-08-17 NOTE — H&P (Signed)
MD GYN HISTORY AND PHYSICAL  Admission Date: 08/17/2021 12:23 PM  Admit Diagnosis: thickened endometrium Patient Name: Kristen Mack        MRN#: [id number]  Subjective:    Patient is a 76 y.o. female [phone] presents for scheduled D&C hysteroscopy for thickened endometrium.    76 year old P2 presented to [facility] office as a transfer of care from [place] for a second opinion about incidental finding on her CT scan of the abdomen and pelvis in October of 2021. Her Gastroenterologist sent her for the CT scan to check on her liver as she has a history of fatty liver dz. On the CT scan her endometrial lining was noted to thickened and she then sent for a pelvic ultrasound. Her ultrasound showed a thickened stripe of 35m. She was advised to present to her Gynecologist.   Patient without any concerning complaints. She denies any episodes of post- menopausal bleeding, no bloating or abdominal pain. Patient denies early satiety or any urinary or defecatory dysfunction.  Repeat ultrasound was done in the office and confirmed thickened endometrium, no other abnormal findings   Pertinent Gynecological History: Reviewed GYN History  Date of Last Mammogram: 08/04/2020.  Date of Last Colonoscopy: 12/27/2013.  Date of last bone density: 07/02/2020 (Notes: Osteoporosis in R femur. Osteo on Spine. Scoliosis   Date of Last Pap Smear: (Notes: about 2 years ago).  HPV Test: Not Applicable.  HPV Vaccination: Not Applicable.  History of Abnormal PAP: N.  History of Cervical Dysplasia: N.  History of Vulvar Dysplasia: N.  Sexually Active: N.  Age at first intercourse: 89  Total lifetime partners: Less than 5.  Sexual Orientation: Heterosexual.  History of Sexually Transmitted Infection: N.  Current Birth Control Method:: Menopause.  Age at Menarche:: 89  Age at Menopause: 89  History of Endometriosis: N.  History of Fibroids: Y.  History of Infertility: N.  History of Recurrent  Ovarian Cysts: N.  History of PCOS: N.    Medical / Surgical History: Past medical history:  Past Medical History:  Diagnosis Date   ADHD (attention deficit hyperactivity disorder) 2007   Adrenal adenoma, left    followed by dr nida   Arthritis    Asthma    followed by pcp   CFS (chronic fatigue syndrome)    CKD (chronic kidney disease), stage III ([facility]    nephrologist---  dr sanford   Depression    Edema of both lower extremities    Fatty liver disease, nonalcoholic    Fibromyalgia    GERD (gastroesophageal reflux disease)    Hiatal hernia    History of adenomatous polyp of colon    History of cardiac murmur    until age 76 pt has had echocardiograms   History of TIA (transient ischemic attack)    per CT scan    Hyperlipidemia    Hypertension    Hypothyroidism    endocrinologist-- dr g. nida   IBS (irritable bowel syndrome)    Meniere disease    OSA on CPAP    per pt uses nightly   Osteopenia    Pancreas cyst    followed by pcp   PMB (postmenopausal bleeding)    PVC's (premature ventricular contractions)    cardiologist--- dr oAudie Box  Type 2 diabetes mellitus treated with insulin ([facility]    endocrinologist-- dr g. nida;    (08-14-2021  per pt checks blood sugar QID ,  fasting average --- 73-150)   Urinary incontinence  Wears glasses    Wears partial dentures    upper    Past surgical history:  Past Surgical History:  Procedure Laterality Date   [place]   and  REPAIR UMBILICAL HERNIA   COLONOSCOPY N/A 04/19/2013   Procedure: COLONOSCOPY;  Surgeon: Rogene Houston, MD;  Location: [facility];  Service: Endoscopy;  Laterality: N/A;     ESOPHAGOGASTRODUODENOSCOPY  02/08/2003   HERNIA REPAIR  1995   HYSTEROSCOPY WITH D & C  11/14/2002   endometrial polypectomy   HYSTEROSCOPY WITH D & C N/A 04/05/2014   Procedure: DILATATION AND CURETTAGE /HYSTEROSCOPY, ENDOMETRIAL POLYPECTOMY;  Surgeon: Sanjuana Kava, MD;   Location: [place];  Service: Gynecology;  Laterality: N/A;   LIVER BIOPSY  01/29/2004   benign   TUBAL LIGATION  1979   Family History:  Family History  Problem Relation Age of Onset   Rectal cancer Brother 68   Diabetes Brother    Ovarian cancer Mother 30   Hypertension Mother    Diabetes Mother    Cancer Mother    Depression Mother    Early death Mother    Obesity Mother    Hypertension Father    Diabetes Father    Depression Father    Hyperlipidemia Father    Stroke Father    Breast cancer Maternal Aunt    Breast cancer Paternal Aunt        four Paternal aunts   Heart disease Paternal Grandfather    Hyperlipidemia Paternal Grandfather    Hypertension Paternal Grandfather    Stroke Paternal Grandfather    Liver disease Sister        x 2; fatty liver   Hearing loss Maternal Grandfather    Hypertension Maternal Grandfather    Heart disease Maternal Grandmother    Hypertension Maternal Grandmother    Obesity Maternal Grandmother    Obesity Paternal Grandmother    ADD / ADHD Daughter    Depression Daughter    ADD / ADHD Daughter    Depression Daughter    Alcohol abuse Maternal Uncle    Depression Maternal Uncle    Drug abuse Maternal Uncle    Early death Maternal Uncle    Alcohol abuse Paternal Uncle    Depression Paternal Uncle    Drug abuse Paternal Uncle    Anxiety disorder Sister    Depression Sister    Hyperlipidemia Sister    Miscarriages / Stillbirths Sister    Cancer Brother    Depression Brother    Early death Brother    Cancer Maternal Aunt    Cancer Maternal Aunt    Cancer Paternal Aunt    Cancer Paternal Aunt    Cancer Paternal Aunt    Cancer Paternal Aunt    Drug abuse Brother    Heart disease Maternal Uncle    Hyperlipidemia Maternal Uncle    Hypertension Maternal Uncle    Hyperlipidemia Sister    Hyperlipidemia Brother    Hyperlipidemia Brother    Hypertension Maternal Uncle    Pancreatic cancer Neg Hx    Esophageal  cancer Neg Hx     Social History:  reports that she quit smoking about 52 years ago. Her smoking use included cigarettes. She has a 4.50 pack-year smoking history. She has never used smokeless tobacco. She reports that she does not drink alcohol and does not use drugs.  Allergies: Allergies  Allergen Reactions  Ambien [Zolpidem Tartrate] Other (See Comments)    Side effect "lost a couple days"; memory loss   Cefzil [Cefprozil] Diarrhea   Codeine Itching and Nausea And Vomiting   Demerol [Meperidine] Other (See Comments)    "messes up senses"   Formaldehyde    Latex Other (See Comments)    "skin gets raw"   Sulfa Antibiotics Other (See Comments)    Severe abd cramp   Celexa [Citalopram Hydrobromide] Other (See Comments)    Mouth sores     Current Medications at time of admission:  Prior to Admission medications   Medication Sig Start Date End Date Taking? Authorizing Provider  albuterol (VENTOLIN HFA) 108 (90 Base) MCG/ACT inhaler INHALE 2 PUFFS INTO LUNGS EVERY 6 HOURS AS NEEDED FOR SHORTNESS OF BREATH Patient taking differently: 2 puffs every 6 (six) hours as needed. INHALE 2 PUFFS INTO LUNGS EVERY 6 HOURS AS NEEDED FOR SHORTNESS OF BREATH 10/07/20  Yes Kathyrn Drown, MD  Ascorbic Acid (VITAMIN C) 1000 MG tablet Take 6,000 mg by mouth daily.    Yes [provider]  Azelastine HCl 137 MCG/SPRAY SOLN USE 2 SPRAY(S) IN EACH NOSTRIL TWICE DAILY Patient taking differently: Place 2 sprays into the nose 2 (two) times daily. 08/27/20  Yes Kathyrn Drown, MD  B Complex Vitamins (B COMPLEX 100 PO) Take 1 tablet by mouth daily.   Yes [provider]  buPROPion (WELLBUTRIN XL) 300 MG 24 hr tablet Take 1 tablet (300 mg total) by mouth daily. Patient taking differently: Take 300 mg by mouth at bedtime. 10/07/20  Yes Luking, Elayne Snare, MD  Chromium Picolinate 1000 MCG TABS Take 1 tablet by mouth daily.   Yes [provider]  Coenzyme Q10 (COQ10) 200 MG CAPS Take 200 mg by  mouth daily.   Yes [provider]  Cyanocobalamin (B-12) 1000 MCG SUBL Place 1,000 mcg under the tongue daily.   Yes [provider]  DIGESTIVE ENZYMES PO Take 1 tablet by mouth 3 (three) times daily.    Yes [provider]  diltiazem (CARDIZEM CD) 120 MG 24 hr capsule Take 1 capsule by mouth once daily Patient taking differently: 120 mg at bedtime. 03/31/21  Yes O'Neal, Cassie Freer, MD  empagliflozin (JARDIANCE) 10 MG TABS tablet Take 1 tablet (10 mg total) by mouth daily before breakfast. Patient taking differently: Take 10 mg by mouth daily before breakfast. 02/10/21  Yes Luking, Scott A, MD  insulin detemir (LEVEMIR FLEXPEN) 100 UNIT/ML FlexPen Inject 50 Units into the skin at bedtime. Patient taking differently: Inject 50 Units into the skin at bedtime. 07/22/21  Yes Nida, Marella Chimes, MD  levothyroxine (SYNTHROID) 125 MCG tablet TAKE 1 TABLET BY MOUTH DAILY BEFORE BREAKFAST Patient taking differently: Take 125 mcg by mouth daily before breakfast. 06/08/21  Yes Nida, Marella Chimes, MD  Lysine HCl 500 MG TABS Take 500 mg by mouth daily.   Yes [provider]  Magnesium 250 MG TABS Take 250 mg by mouth 2 (two) times daily.   Yes [provider]  meclizine (ANTIVERT) 25 MG tablet Take 25 mg by mouth 3 (three) times daily as needed for dizziness.   Yes [provider]  montelukast (SINGULAIR) 10 MG tablet TAKE 1 TABLET BY MOUTH AT BEDTIME Patient taking differently: Take 10 mg by mouth at bedtime. 07/08/21  Yes Luking, Elayne Snare, MD  OVER THE COUNTER MEDICATION Take 1 Scoop by mouth 3 (three) times daily with meals. Fennel seeds   Yes [provider]  rosuvastatin (CRESTOR) 5 MG tablet TAKE 1 TABLET BY MOUTH ON MONDAY, WEDNESDAY AND FRIDAY Patient taking differently: Take 5 mg by mouth 3 (three) times a week. TAKE 1 TABLET BY MOUTH ON MONDAY, [facility] AND FRIDAY 07/08/21  Yes Luking, Elayne Snare, MD  TART CHERRY PO Take 1 tablet by  mouth in the morning, at noon, and at bedtime.   Yes [provider]  tiZANidine (ZANAFLEX) 2 MG tablet 1 qhs prn caution drowsiness Patient taking differently: Take 2 mg by mouth at bedtime as needed. 1 qhs prn caution drowsiness 02/10/21  Yes Luking, Elayne Snare, MD  Vitamin A 3 MG (10000 UT) TABS Take 10,000 Units by mouth daily.   Yes [provider]  vitamin E 180 MG (400 UNITS) capsule Take 800 Units by mouth daily.    Yes [provider]  blood glucose meter kit and supplies Dispense based on patient and insurance preference. Use to test sugars BID  (FOR ICD-10 E10.9, E11.9). 07/17/21   Kathyrn Drown, MD  Continuous Blood Gluc Receiver (FREESTYLE LIBRE 2 READER) DEVI As directed 08/10/21   Cassandria Anger, MD  Continuous Blood Gluc Sensor (FREESTYLE LIBRE 2 SENSOR) MISC 1 Piece by Does not apply route every 14 (fourteen) days. 08/10/21   Cassandria Anger, MD  glucose blood (ONETOUCH VERIO) test strip Use to test blood sugars four times  daily 08/10/21   Cassandria Anger, MD  ipratropium (ATROVENT) 0.02 % nebulizer solution USE ONE VIAL IN NEBULIZER 4 TIMES DAILY AS NEEDED FOR SHORTNESS OF BREATH Patient taking differently: every 4 (four) hours as needed. USE ONE VIAL IN NEBULIZER 4 TIMES DAILY AS NEEDED FOR SHORTNESS OF BREATH 02/18/18   Kathyrn Drown, MD  OVER THE COUNTER MEDICATION Take 1 tablet by mouth at bedtime as needed (sleep). Sleep essentials    [provider]    Review of Systems: Constitutional: Negative   HENT: Negative   Eyes: Negative   Respiratory: Negative   Cardiovascular: Negative   Gastrointestinal: Negative  Genitourinary: neg for vaginal bleeding   Musculoskeletal: Negative   Skin: Negative   Neurological: Negative   Endo/Heme/Allergies: Negative   Psychiatric/Behavioral: Negative      Objective:    Physical Exam: VS: Blood pressure (!) 221/123, pulse 83, temperature 98 F (36.7 C), temperature source Oral,  resp. rate 18, height 4' 11"  (1.499 m), weight 96.8 kg, SpO2 98 %. Physical Exam General:   alert, cooperative, and no distress  Skin:   normal  Lungs:   clear to auscultation bilaterally  Pelvis:  Deferred to OR   Labs / Imaging: 08/04/2020: Transvaginal ultrasound performed. Anteverted uterus, measures 8.2cm fundus to external os.  Endometrium thickened 16.53 Right ovary appears within normal limits  Left ovary appears within normal limits No free fluid noted in culdesac Bilateral adnexas appear unremarkable  MRI from 11/2019: "Abnormal fluid in the endometrial cavity of the uterus. Patient needs ultrasound for further evaluation.     Assessment:        Patient is a 76 y.o. y.o [phone] with diagnosis of  thickened endometrium incidentally found on MRI by gastroenterology.        Plan:   The patient has been given the risks, benefits, indications, and alternatives of D&C hysteroscopy for her abnormal uterine bleeding to include treatment with OCPs, Mirena IUS, Depo-Provera, Lupron, progesterone, endometrial ablation, etc.  Patient agrees to further examine the intrauterine cavity with diagnostic hysteroscopy and if there is a  submucous fibroid or endometrial polyp, it will be removed.  Risks of dilatation and curettage, hysteroscopy and polypectomy discussed with patient to include, but not limited to the risk of infection and bleeding, pain and/or injury to associated pelvic organs to include bowel/bladder, ureters or other surrounding nerves, arteries and veins.     The risk of uterine perforation was discussed with the patient. She was informed that she may not have complete resolution of heavy VB. She was informed that she may have recurrence of the bleeding or a diagnosis of malignancy after the results of pathology are returned that would possibly require further medical and / or surgical therapy. Patient understands the attendant risks and agrees to proceed with the procedure. All  concerns and queries made by patient were adequately addressed and answered.     All the above was reviewed in detail.  All inquiries made by patient were answered.  Consent was signed, witnessed and placed into chart. Post op Instructions were reviewed, including office follow up.      Sanjuana Kava MD 08/17/2021, 1:48 PM

## 2021-08-17 NOTE — Anesthesia Preprocedure Evaluation (Addendum)
Anesthesia Evaluation  Patient identified by MRN, date of birth, ID band Patient awake    Reviewed: Allergy & Precautions, NPO status , Patient's Chart, lab work & pertinent test results  Airway Mallampati: III  TM Distance: >3 FB Neck ROM: Full    Dental  (+) Missing, Dental Advisory Given,    Pulmonary asthma , sleep apnea and Continuous Positive Airway Pressure Ventilation , former smoker,    Pulmonary exam normal breath sounds clear to auscultation       Cardiovascular hypertension, Normal cardiovascular exam Rhythm:Regular Rate:Normal  TTE 2021 1. Frequent ectopy affects interpretation of LV systolic function. Left  ventricular ejection fraction, by estimation, is 55 to 60%. The left  ventricle has normal function. The left ventricle has no regional wall  motion abnormalities. There is moderate  left ventricular hypertrophy.  2. Right ventricular systolic function is normal. The right ventricular  size is normal.  3. Limited echo to evaluate LV function    Neuro/Psych PSYCHIATRIC DISORDERS Depression TIA   GI/Hepatic hiatal hernia, GERD  ,NASH   Endo/Other  diabetes, Type 2, Oral Hypoglycemic Agents, Insulin DependentHypothyroidism Morbid obesity (BMI 43)  Renal/GU Renal InsufficiencyRenal disease  negative genitourinary   Musculoskeletal  (+) Arthritis , Fibromyalgia -  Abdominal   Peds  (+) ADHD Hematology negative hematology ROS (+)   Anesthesia Other Findings   Reproductive/Obstetrics                           Anesthesia Physical Anesthesia Plan  ASA: 3  Anesthesia Plan: General   Post-op Pain Management: Tylenol PO (pre-op)* and Minimal or no pain anticipated   Induction: Intravenous  PONV Risk Score and Plan: 3 and Ondansetron, Dexamethasone and Treatment may vary due to age or medical condition  Airway Management Planned: LMA  Additional Equipment:   Intra-op  Plan:   Post-operative Plan: Extubation in OR  Informed Consent: I have reviewed the patients History and Physical, chart, labs and discussed the procedure including the risks, benefits and alternatives for the proposed anesthesia with the patient or authorized representative who has indicated his/her understanding and acceptance.     Dental advisory given  Plan Discussed with: CRNA  Anesthesia Plan Comments:         Anesthesia Quick Evaluation

## 2021-08-17 NOTE — Transfer of Care (Signed)
Immediate Anesthesia Transfer of Care Note  Patient: Kristen Mack  Procedure(s) Performed: DILATATION & CURETTAGE/HYSTEROSCOPY (Uterus) BIOPSY OF ENDOMETRIUM AND OR POLYPECTOMY (Uterus)  Patient Location: PACU  Anesthesia Type:General  Level of Consciousness: awake, alert , oriented and patient cooperative  Airway & Oxygen Therapy: Patient Spontanous Breathing  Post-op Assessment: Report given to RN and Post -op Vital signs reviewed and stable  Post vital signs: Reviewed and stable  Last Vitals:  Vitals Value Taken Time  BP    Temp    Pulse 54 08/17/21 1527  Resp 17 08/17/21 1528  SpO2 90 % 08/17/21 1527  Vitals shown include unvalidated device data.  Last Pain:  Vitals:   08/17/21 1240  TempSrc: Oral  PainSc: 0-No pain         Complications: No notable events documented.

## 2021-08-18 ENCOUNTER — Encounter (HOSPITAL_BASED_OUTPATIENT_CLINIC_OR_DEPARTMENT_OTHER): Payer: Self-pay | Admitting: Obstetrics & Gynecology

## 2021-08-18 NOTE — Anesthesia Postprocedure Evaluation (Signed)
Anesthesia Post Note  Patient: KA BENCH  Procedure(s) Performed: DILATATION & CURETTAGE/HYSTEROSCOPY (Uterus) BIOPSY OF ENDOMETRIUM AND OR POLYPECTOMY (Uterus)     Patient location during evaluation: PACU Anesthesia Type: General Level of consciousness: awake and alert Pain management: pain level controlled Vital Signs Assessment: post-procedure vital signs reviewed and stable Respiratory status: spontaneous breathing, nonlabored ventilation, respiratory function stable and patient connected to nasal cannula oxygen Cardiovascular status: blood pressure returned to baseline and stable Postop Assessment: no apparent nausea or vomiting Anesthetic complications: no   No notable events documented.  Last Vitals:  Vitals:   08/17/21 1600 08/17/21 1630  BP: (!) 197/107 (!) 188/106  Pulse: 68 69  Resp: 14 16  Temp:  36.8 C  SpO2: 92% 95%    Last Pain:  Vitals:   08/17/21 1630  TempSrc:   PainSc: 0-No pain   Pain Goal:                   Zaveon Gillen L Alim Cattell

## 2021-08-19 ENCOUNTER — Other Ambulatory Visit: Payer: Self-pay | Admitting: Family Medicine

## 2021-08-19 DIAGNOSIS — I129 Hypertensive chronic kidney disease with stage 1 through stage 4 chronic kidney disease, or unspecified chronic kidney disease: Secondary | ICD-10-CM

## 2021-08-19 LAB — SURGICAL PATHOLOGY

## 2021-08-20 ENCOUNTER — Ambulatory Visit (HOSPITAL_COMMUNITY): Payer: HMO

## 2021-08-21 NOTE — Telephone Encounter (Signed)
Patient scheduled appt 08/31/21 at 2:30 pm with Dr Nicki Reaper

## 2021-08-31 ENCOUNTER — Ambulatory Visit (INDEPENDENT_AMBULATORY_CARE_PROVIDER_SITE_OTHER): Payer: HMO | Admitting: Family Medicine

## 2021-08-31 ENCOUNTER — Encounter: Payer: Self-pay | Admitting: Family Medicine

## 2021-08-31 VITALS — BP 134/74 | Temp 96.6°F | Wt 214.8 lb

## 2021-08-31 DIAGNOSIS — N183 Chronic kidney disease, stage 3 unspecified: Secondary | ICD-10-CM

## 2021-08-31 DIAGNOSIS — H8390 Unspecified disease of inner ear, unspecified ear: Secondary | ICD-10-CM

## 2021-08-31 DIAGNOSIS — I129 Hypertensive chronic kidney disease with stage 1 through stage 4 chronic kidney disease, or unspecified chronic kidney disease: Secondary | ICD-10-CM | POA: Diagnosis not present

## 2021-08-31 DIAGNOSIS — R0600 Dyspnea, unspecified: Secondary | ICD-10-CM | POA: Diagnosis not present

## 2021-08-31 DIAGNOSIS — E785 Hyperlipidemia, unspecified: Secondary | ICD-10-CM

## 2021-08-31 DIAGNOSIS — E1122 Type 2 diabetes mellitus with diabetic chronic kidney disease: Secondary | ICD-10-CM | POA: Diagnosis not present

## 2021-08-31 DIAGNOSIS — I10 Essential (primary) hypertension: Secondary | ICD-10-CM

## 2021-08-31 DIAGNOSIS — E1169 Type 2 diabetes mellitus with other specified complication: Secondary | ICD-10-CM

## 2021-08-31 DIAGNOSIS — M62830 Muscle spasm of back: Secondary | ICD-10-CM | POA: Diagnosis not present

## 2021-08-31 NOTE — Progress Notes (Signed)
   Subjective:    Patient ID: Kristen Mack, female    DOB: 11-09-1945, 76 y.o.   MRN: [id number]  HPI Pt here to discuss fax from insurance. Insurance faxed over letter regarding medications.  HTN (hypertension), benign  Type 2 DM with CKD stage 3 and hypertension (HCC)  Hyperlipidemia associated with type 2 diabetes mellitus ([place]) - Plan: Lipid Profile  Dyspnea, unspecified type - Plan: Ambulatory referral to Pulmonology, CANCELED: Ambulatory referral to Pulmonology  Disorder of inner ear, unspecified laterality  Back spasm She is here primarily to review her medications It is coming to our attention that she is using the albuterol very frequently each evening She has tried steroid inhaler in the past but it caused a rash so she stopped taking it She denies much in the way of wheezing just relates some shortness of breath and coughing at night Denies PND denies orthopnea She states she does have some swelling in the legs but on today's exam it is more adipose aspect in her legs rather than true edema We also discussed how tizanidine can cause drowsiness but she states she uses this sparingly to help her with severe back spasms and it was the only medication that has helped She also uses meclizine at nighttime when necessary for her in her ear she states that ENT doctor diagnosed her with that and prescribed meclizine and this seems to be the only thing that helps She does have hyperlipidemia she takes her statin but she does need blood work She is followed by endocrinology and kidney doctor  Review of Systems     Objective:   Physical Exam General-in no acute distress Eyes-no discharge Lungs-respiratory rate normal, CTA CV-no murmurs,RRR Extremities skin warm dry no edema Neuro grossly normal Behavior normal, alert        Assessment & Plan:  1. HTN (hypertension), benign Blood pressure decent control continue current measures I do not appreciate any significant  pedal edema going on May continue her diltiazem This allows blood pressure control as well as keeping heart rate control 2. Type 2 DM with CKD stage 3 and hypertension ([place]) Followed by endocrinology.  Continue current measures with them watch diet closely.  3. Hyperlipidemia associated with type 2 diabetes mellitus (HCC) Continue rosuvastatin times per week - Lipid Profile  4. Dyspnea, unspecified type Patient relates in the evening time she gets short of breath she uses albuterol frequently anywhere between 1 and 3 times each evening I told her that this was too frequent We did discuss steroid inhaler she defers on this stating that she has had thrush in the past Referral back to pulmonary for further discussion regarding this, ejection fraction in the past is looked good - Ambulatory referral to Pulmonology  5. Disorder of inner ear, unspecified laterality She relates she uses meclizine as needed basis and states this helps she only uses it in the evening denies it causing drowsiness she is states that this is the only thing that helps  6. Back spasm Tizanidine at nighttime to help with back spasms

## 2021-09-01 LAB — LIPID PANEL
Chol/HDL Ratio: 4.3 ratio (ref 0.0–4.4)
Cholesterol, Total: 191 mg/dL (ref 100–199)
HDL: 44 mg/dL (ref >39)
LDL Chol Calc (NIH): 114 mg/dL — ABNORMAL HIGH (ref 0–99)
Triglycerides: 186 mg/dL — ABNORMAL HIGH (ref 0–149)
VLDL Cholesterol Cal: 33 mg/dL (ref 5–40)

## 2021-09-02 ENCOUNTER — Encounter (HOSPITAL_COMMUNITY): Payer: HMO | Admitting: Physical Therapy

## 2021-09-03 ENCOUNTER — Other Ambulatory Visit: Payer: Self-pay

## 2021-09-03 ENCOUNTER — Encounter: Payer: Self-pay | Admitting: "Endocrinology

## 2021-09-03 DIAGNOSIS — E1122 Type 2 diabetes mellitus with diabetic chronic kidney disease: Secondary | ICD-10-CM

## 2021-09-03 MED ORDER — ROSUVASTATIN CALCIUM 5 MG PO TABS: ORAL_TABLET | ORAL | 5 refills | Status: DC

## 2021-09-03 MED ORDER — GLUCOSE BLOOD VI STRP: ORAL_STRIP | 2 refills | Status: DC

## 2021-09-03 NOTE — Addendum Note (Signed)
Addended by: Dairl Ponder on: 09/03/2021 03:54 PM   Modules accepted: Orders

## 2021-09-04 ENCOUNTER — Encounter (HOSPITAL_COMMUNITY): Payer: HMO | Admitting: Physical Therapy

## 2021-09-04 ENCOUNTER — Telehealth (HOSPITAL_COMMUNITY): Payer: Self-pay | Admitting: Physical Therapy

## 2021-09-04 NOTE — Telephone Encounter (Signed)
S/w patient she is sick on the stomach and will not be here today

## 2021-09-07 ENCOUNTER — Encounter (HOSPITAL_COMMUNITY): Payer: Self-pay | Admitting: Physical Therapy

## 2021-09-07 ENCOUNTER — Ambulatory Visit (HOSPITAL_COMMUNITY): Payer: HMO | Attending: Family Medicine | Admitting: Physical Therapy

## 2021-09-07 DIAGNOSIS — M6281 Muscle weakness (generalized): Secondary | ICD-10-CM | POA: Diagnosis not present

## 2021-09-07 DIAGNOSIS — R296 Repeated falls: Secondary | ICD-10-CM

## 2021-09-07 NOTE — Therapy (Signed)
OUTPATIENT PHYSICAL THERAPY NEURO EVALUATION   Patient Name: Kristen Mack MRN: [id number] DOB:26-Jun-1945, 76 y.o., female Today's Date: 09/07/2021  PCP: Sallee Lange  REFERRING PROVIDER: Sallee Lange    PT End of Session - 09/07/21 1457     Visit Number 2    Number of Visits 12    Date for PT Re-Evaluation 09/17/21    Authorization Type [employer]    Progress Note Due on Visit 10    PT Start Time 1457   arrives late   PT Stop Time 1525    PT Time Calculation (min) 28 min    Activity Tolerance Patient tolerated treatment well    Behavior During Therapy [facility] for tasks assessed/performed             Past Medical History:  Diagnosis Date   ADHD (attention deficit hyperactivity disorder) 2007   Adrenal adenoma, left    followed by dr nida   Arthritis    Asthma    followed by pcp   CFS (chronic fatigue syndrome)    CKD (chronic kidney disease), stage III [facility])    nephrologist---  dr Joelyn Oms   Depression    Edema of both lower extremities    Fatty liver disease, nonalcoholic    Fibromyalgia    GERD (gastroesophageal reflux disease)    Hiatal hernia    History of adenomatous polyp of colon    History of cardiac murmur    until age 20- pt has had echocardiograms   History of TIA (transient ischemic attack)    per CT scan    Hyperlipidemia    Hypertension    Hypothyroidism    endocrinologist-- dr g. nida   IBS (irritable bowel syndrome)    Meniere disease    OSA on CPAP    per pt uses nightly   Osteopenia    Pancreas cyst    followed by pcp   PMB (postmenopausal bleeding)    PVC's (premature ventricular contractions)    cardiologist--- dr Audie Box   Type 2 diabetes mellitus treated with insulin [facility])    endocrinologist-- dr g. nida;    (08-14-2021  per pt checks blood sugar QID ,  fasting average --- 73-150)   Urinary incontinence    Wears glasses    Wears partial dentures    upper   Past Surgical History:  Procedure Laterality Date    CERVICAL POLYPECTOMY N/A 08/17/2021   Procedure: BIOPSY OF ENDOMETRIUM AND OR POLYPECTOMY;  Surgeon: Sanjuana Kava, MD;  Location: [place];  Service: Gynecology;  Laterality: N/A;   [place]   and  REPAIR UMBILICAL HERNIA   COLONOSCOPY N/A 04/19/2013   Procedure: COLONOSCOPY;  Surgeon: Rogene Houston, MD;  [facility];  Service: Endoscopy;  Laterality: N/A;     DILITATION & CURRETTAGE/HYSTROSCOPY WITH NOVASURE ABLATION N/A 08/17/2021   Procedure: DILATATION & CURETTAGE/HYSTEROSCOPY;  Surgeon: Sanjuana Kava, MD;  Location: [place];  Service: Gynecology;  Laterality: N/A;   ESOPHAGOGASTRODUODENOSCOPY  02/08/2003   HERNIA REPAIR  1995   HYSTEROSCOPY WITH D & C  11/14/2002   endometrial polypectomy   HYSTEROSCOPY WITH D & C N/A 04/05/2014   Procedure: DILATATION AND CURETTAGE /HYSTEROSCOPY, ENDOMETRIAL POLYPECTOMY;  Surgeon: Sanjuana Kava, MD;  Location: [place];  Service: Gynecology;  Laterality: N/A;   LIVER BIOPSY  01/29/2004   benign   TUBAL LIGATION  1979  Patient Active Problem List   Diagnosis Date Noted   Type 2 DM with CKD stage 3 and hypertension ([place]) 08/10/2021   Trochanteric bursitis 06/05/2021   Bruising tendency 06/30/2020   Mixed hyperlipidemia 02/13/2020   Diabetes mellitus without complication ([place]) 64/33/2951   Sleep apnea 07/11/2017   Pancreatic pseudocyst 05/10/2017   Adrenal adenoma 05/10/2017   Osteoporosis 11/21/2016   Fibromyalgia 08/10/2016   Female stress incontinence 11/05/2015   NASH (nonalcoholic steatohepatitis) 04/24/2015   Orthostatic hypotension 09/19/2013   Elevated transaminase level 09/19/2013   HTN (hypertension), benign 09/03/2013   Depression 08/14/2013   Fatty liver 08/14/2013   Other specified hypothyroidism 10/23/2012   Osteopenia 07/08/2012   Morbid obesity ([place]) 07/08/2012   Urinary bladder incontinence 04/30/2012   Family  history of rectal cancer 10/04/2011   GERD (gastroesophageal reflux disease) 10/04/2011   IBS (irritable bowel syndrome) 10/04/2011    ONSET DATE: 04/07/2021  REFERRING DIAG: Muscle weakness with decreased balance  THERAPY DIAG:  Muscle weakness (generalized)  Repeated falls  SUBJECTIVE:                                                                                                                                                                                              SUBJECTIVE STATEMENT:    Patient has been sick so she hasn't been doing many exercises.   Pt accompanied by: self  PERTINENT HISTORY:Type 2 DM with CKD stage 3 and hypertension ([place]   IMPRESSION:   PAIN: (we are not seeing pt for her chronic back pain and will not be addressing) Are you having pain? Yes: Pain location: low back sitting 2-3 moving increases to a 7-8  Pain description: dull ache  Aggravating factors: weight bearing  Relieving factors: sitting   PRECAUTIONS: None  WEIGHT BEARING RESTRICTIONS No  FALLS: Has patient fallen in last 6 months? Yes. Number of falls 2  LIVING ENVIRONMENT: Lives with: lives with their family Lives in: House/apartment Stairs: Yes: External: 5 steps; on right going up goes up steps sideways  Has following equipment at home: Single point cane; four wheeled walker   PLOF: Independent with household mobility with device  PATIENT GOALS to have less back pain, improved balance   OBJECTIVE:    COGNITION: Overall cognitive status: Within functional limits for tasks assessed   SENSATION: [facility] MMT:    MMT Right 08/06/2021 Left 08/06/2021  Hip flexion 3-/5 2+/5  Hip extension 3-/5 2/5  Hip abduction 3/5 4/5   Hip adduction    Hip internal rotation    Hip external rotation    Knee flexion 3+/5 3+/5   Knee  extension 4/5 3/5  Ankle dorsiflexion 3/5 3/5   Ankle plantarflexion    Ankle inversion    Ankle eversion    (Blank rows = not tested)   FUNCTIONAL  TESTs:  30 seconds chair stand test 10 x  Single leg stance unable             2 minute walking test with 4 wheeled walker 232 ft   TODAY'S TREATMENT:  09/07/21 LAQ 10 x 5 second holds bilateral  STS 2x 10  Standing hip abduction 2x 10 bilateral  Seated Ab set 10 x 5 second holds Standing HR/TR 1x 10 each Seated March 2x 10 5 second holds    EVAL Sitting: Sit to stand x 10 LAQ LT x 10  Resisted DF with green t band x 10  Ab set x 5    PATIENT EDUCATION: Education details: HEP Person educated: Patient Education method: Explanation Education comprehension: verbalized understanding   HOME EXERCISE PROGRAM:  Sit to stand x 10 LAQ LT x 10  Resisted DF with green t band x 10  Ab set x 5  Standing hip abduction    GOALS: Goals reviewed with patient? No  SHORT TERM GOALS: Target date: 08/27/2021   PT I in HEP to be able to come sit to stand I  Baseline: Goal status: IN PROGRESS  2.  PT to be able to single leg stance for 5 seconds B to decrease risk of falling  Baseline:  Goal status: IN PROGRESS    LONG TERM GOALS: Target date: 09/17/2021     PT to be I in advanced HEP to improve mm one grade to be able to ascend/descend 4 steps in a normal fashion holding      Onto a rail  Baseline:  Goal status: IN PROGRESS  2.  PT to be able to single leg stance for at least 10 seconds to decrease risk of falls  Baseline:  Goal status: IN PROGRESS  3.  PT to be able to come sit to stand 12 x in 30 seconds to be at average for age  Baseline:  Goal status: IN PROGRESS    ASSESSMENT:  CLINICAL IMPRESSION: Patient returns to clinic after several weeks of being sick. Session limited due to patient's late arrival. Continued with previously completed exercises and patient able to complete increased number of reps. Added core and hip strengthening as able in both standing/sitting positions as she fatigues quickly with standing. Patient will continue to benefit from physical  therapy in order to improve function and reduce impairment.    OBJECTIVE IMPAIRMENTS decreased activity tolerance, decreased balance, decreased mobility, difficulty walking, decreased strength, and pain.   ACTIVITY LIMITATIONS cleaning and community activity.   PERSONAL FACTORS Age, Fitness, and 1-2 comorbidities: DM, HTN, CKD  are also affecting patient's functional outcome.    REHAB POTENTIAL: Good  CLINICAL DECISION MAKING: Stable/uncomplicated  EVALUATION COMPLEXITY: Moderate  PLAN: PT FREQUENCY: 2x/week  PT DURATION: 6 weeks  PLANNED INTERVENTIONS: Therapeutic exercises, Therapeutic activity, Neuromuscular re-education, Balance training, Gait training, and Patient/Family education  PLAN FOR NEXT SESSION: Progress core, LE and balance exercises   3:30 PM, 09/07/21 Mearl Latin PT, DPT Physical Therapist at [facility]

## 2021-09-08 ENCOUNTER — Other Ambulatory Visit: Payer: Self-pay | Admitting: Family Medicine

## 2021-09-09 ENCOUNTER — Ambulatory Visit (HOSPITAL_COMMUNITY): Payer: HMO | Attending: Family Medicine | Admitting: Physical Therapy

## 2021-09-09 ENCOUNTER — Encounter (HOSPITAL_COMMUNITY): Payer: Self-pay | Admitting: Physical Therapy

## 2021-09-09 DIAGNOSIS — R278 Other lack of coordination: Secondary | ICD-10-CM | POA: Diagnosis not present

## 2021-09-09 DIAGNOSIS — M6281 Muscle weakness (generalized): Secondary | ICD-10-CM | POA: Diagnosis not present

## 2021-09-09 DIAGNOSIS — R296 Repeated falls: Secondary | ICD-10-CM

## 2021-09-09 NOTE — Therapy (Signed)
OUTPATIENT PHYSICAL THERAPY NEURO EVALUATION   Patient Name: Kristen Mack MRN: [id number] DOB:1945-06-21, 76 y.o., female Today's Date: 09/09/2021  PCP: Sallee Lange  REFERRING PROVIDER: Sallee Lange    PT End of Session - 09/09/21 1547     Visit Number 3    Number of Visits 12    Date for PT Re-Evaluation 09/17/21    Authorization Type [employer]    Progress Note Due on Visit 10    PT Start Time 7371    PT Stop Time 1615    PT Time Calculation (min) 25 min    Activity Tolerance Patient tolerated treatment well    Behavior During Therapy [facility] for tasks assessed/performed             Past Medical History:  Diagnosis Date   ADHD (attention deficit hyperactivity disorder) 2007   Adrenal adenoma, left    followed by dr nida   Arthritis    Asthma    followed by pcp   CFS (chronic fatigue syndrome)    CKD (chronic kidney disease), stage III [facility])    nephrologist---  dr Joelyn Oms   Depression    Edema of both lower extremities    Fatty liver disease, nonalcoholic    Fibromyalgia    GERD (gastroesophageal reflux disease)    Hiatal hernia    History of adenomatous polyp of colon    History of cardiac murmur    until age 70- pt has had echocardiograms   History of TIA (transient ischemic attack)    per CT scan    Hyperlipidemia    Hypertension    Hypothyroidism    endocrinologist-- dr g. nida   IBS (irritable bowel syndrome)    Meniere disease    OSA on CPAP    per pt uses nightly   Osteopenia    Pancreas cyst    followed by pcp   PMB (postmenopausal bleeding)    PVC's (premature ventricular contractions)    cardiologist--- dr Audie Box   Type 2 diabetes mellitus treated with insulin [facility])    endocrinologist-- dr g. nida;    (08-14-2021  per pt checks blood sugar QID ,  fasting average --- 73-150)   Urinary incontinence    Wears glasses    Wears partial dentures    upper   Past Surgical History:  Procedure Laterality Date   CERVICAL  POLYPECTOMY N/A 08/17/2021   Procedure: BIOPSY OF ENDOMETRIUM AND OR POLYPECTOMY;  Surgeon: Sanjuana Kava, MD;  Location: [place];  Service: Gynecology;  Laterality: N/A;   [place]   and  REPAIR UMBILICAL HERNIA   COLONOSCOPY N/A 04/19/2013   Procedure: COLONOSCOPY;  Surgeon: Rogene Houston, MD;  [facility];  Service: Endoscopy;  Laterality: N/A;     DILITATION & CURRETTAGE/HYSTROSCOPY WITH NOVASURE ABLATION N/A 08/17/2021   Procedure: DILATATION & CURETTAGE/HYSTEROSCOPY;  Surgeon: Sanjuana Kava, MD;  Location: [place];  Service: Gynecology;  Laterality: N/A;   ESOPHAGOGASTRODUODENOSCOPY  02/08/2003   HERNIA REPAIR  1995   HYSTEROSCOPY WITH D & C  11/14/2002   endometrial polypectomy   HYSTEROSCOPY WITH D & C N/A 04/05/2014   Procedure: DILATATION AND CURETTAGE /HYSTEROSCOPY, ENDOMETRIAL POLYPECTOMY;  Surgeon: Sanjuana Kava, MD;  Location: [place];  Service: Gynecology;  Laterality: N/A;   LIVER BIOPSY  01/29/2004   benign   TUBAL LIGATION  1979   Patient Active Problem  List   Diagnosis Date Noted   Type 2 DM with CKD stage 3 and hypertension ([place]) 08/10/2021   Trochanteric bursitis 06/05/2021   Bruising tendency 06/30/2020   Mixed hyperlipidemia 02/13/2020   Diabetes mellitus without complication ([place]) 24/40/1027   Sleep apnea 07/11/2017   Pancreatic pseudocyst 05/10/2017   Adrenal adenoma 05/10/2017   Osteoporosis 11/21/2016   Fibromyalgia 08/10/2016   Female stress incontinence 11/05/2015   NASH (nonalcoholic steatohepatitis) 04/24/2015   Orthostatic hypotension 09/19/2013   Elevated transaminase level 09/19/2013   HTN (hypertension), benign 09/03/2013   Depression 08/14/2013   Fatty liver 08/14/2013   Other specified hypothyroidism 10/23/2012   Osteopenia 07/08/2012   Morbid obesity ([place]) 07/08/2012   Urinary bladder incontinence 04/30/2012   Family history of  rectal cancer 10/04/2011   GERD (gastroesophageal reflux disease) 10/04/2011   IBS (irritable bowel syndrome) 10/04/2011    ONSET DATE: 04/07/2021  REFERRING DIAG: Muscle weakness with decreased balance  THERAPY DIAG:  Muscle weakness (generalized)  Repeated falls  SUBJECTIVE:                                                                                                                                                                                              SUBJECTIVE STATEMENT:    Patient states that she has been trying to do the exercises more.  She wears out quickly.      PERTINENT HISTORY:Type 2 DM with CKD stage 3 and hypertension ([place]   IMPRESSION:   PAIN: (we are not seeing pt for her chronic back pain and will not be addressing) Are you having pain? Yes 6.   Lower back   PRECAUTIONS: None  WEIGHT BEARING RESTRICTIONS No  FALLS: Has patient fallen in last 6 months? Yes. Number of falls 2  LIVING ENVIRONMENT: Lives with: lives with their family Lives in: House/apartment Stairs: Yes: External: 5 steps; on right going up goes up steps sideways  Has following equipment at home: Single point cane; four wheeled walker   PLOF: Independent with household mobility with device  PATIENT GOALS to have less back pain, improved balance   OBJECTIVE:    MMT:    MMT Right 08/06/2021 Left 08/06/2021  Hip flexion 3-/5 2+/5  Hip extension 3-/5 2/5  Hip abduction 3/5 4/5   Hip adduction    Hip internal rotation    Hip external rotation    Knee flexion 3+/5 3+/5   Knee extension 4/5 3/5  Ankle dorsiflexion 3/5 3/5   Ankle plantarflexion    Ankle inversion    Ankle eversion    (Blank rows = not tested)   FUNCTIONAL  TESTs:  30 seconds chair stand test 10 x  Single leg stance unable             2 minute walking test with 4 wheeled walker 232 ft   TODAY'S TREATMENT:              6/14:             Standing:             Heel raises x 10            Squat x 10             Side step at mat x 2 RT            Marching 5 reps x 2                          Sitting :            LAQ 3# x 10 B             Sit to stand x 10   09/07/21 LAQ 10 x 5 second holds bilateral  STS 2x 10  Standing hip abduction 2x 10 bilateral  Seated Ab set 10 x 5 second holds Standing HR/TR 1x 10 each Seated March 2x 10 5 second holds    EVAL Sitting: Sit to stand x 10 LAQ LT x 10  Resisted DF with green t band x 10  Ab set x 5    PATIENT EDUCATION: 6/14:  To complete exercises slowly and hold 3 seconds; rest when winded.  Education details: HEP Person educated: Patient Education method: Explanation Education comprehension: verbalized understanding   HOME EXERCISE PROGRAM: 6/14:  Added standing heel raises and squats.   Sit to stand x 10 LAQ LT x 10  Resisted DF with green t band x 10  Ab set x 5  Standing hip abduction    GOALS: Goals reviewed with patient? No  SHORT TERM GOALS: Target date: 08/27/2021   PT I in HEP to be able to come sit to stand I  Baseline: Goal status: IN PROGRESS  2.  PT to be able to single leg stance for 5 seconds B to decrease risk of falling  Baseline:  Goal status: IN PROGRESS    LONG TERM GOALS: Target date: 09/17/2021     PT to be I in advanced HEP to improve mm one grade to be able to ascend/descend 4 steps in a normal fashion holding      Onto a rail  Baseline:  Goal status: IN PROGRESS  2.  PT to be able to single leg stance for at least 10 seconds to decrease risk of falls  Baseline:  Goal status: IN PROGRESS  3.  PT to be able to come sit to stand 12 x in 30 seconds to be at average for age  Baseline:  Goal status: IN PROGRESS    ASSESSMENT:  CLINICAL IMPRESSION:  Pt late for appointment again today.  Therapist spoke to daughter about the importance of arriving on time.  Pt needed multiple rest breaks throughout treatment today.  Pt activity tolerance still poor. Pt will continue to benefit from  skilled PT to progress strength , balance and Activity tolerance.  OBJECTIVE IMPAIRMENTS decreased activity tolerance, decreased balance, decreased mobility, difficulty walking, decreased strength, and pain.   ACTIVITY LIMITATIONS cleaning and community activity.   PERSONAL FACTORS Age, Fitness, and 1-2  comorbidities: DM, HTN, CKD  are also affecting patient's functional outcome.    REHAB POTENTIAL: Good  CLINICAL DECISION MAKING: Stable/uncomplicated  EVALUATION COMPLEXITY: Moderate  PLAN: PT FREQUENCY: 2x/week  PT DURATION: 6 weeks  PLANNED INTERVENTIONS: Therapeutic exercises, Therapeutic activity, Neuromuscular re-education, Balance training, Gait training, and Patient/Family education  PLAN FOR NEXT SESSION: Progress core, LE and balance exercises   4:48 PM, 09/09/21 Rayetta Humphrey, PT CLT [phone]

## 2021-09-11 DIAGNOSIS — E1122 Type 2 diabetes mellitus with diabetic chronic kidney disease: Secondary | ICD-10-CM | POA: Diagnosis not present

## 2021-09-14 ENCOUNTER — Encounter (HOSPITAL_COMMUNITY): Payer: HMO | Admitting: Physical Therapy

## 2021-09-16 ENCOUNTER — Ambulatory Visit (HOSPITAL_COMMUNITY): Payer: HMO | Attending: Family Medicine | Admitting: Physical Therapy

## 2021-09-16 ENCOUNTER — Encounter (HOSPITAL_COMMUNITY): Payer: Self-pay | Admitting: Physical Therapy

## 2021-09-16 DIAGNOSIS — M6281 Muscle weakness (generalized): Secondary | ICD-10-CM

## 2021-09-16 DIAGNOSIS — R296 Repeated falls: Secondary | ICD-10-CM

## 2021-09-16 NOTE — Therapy (Signed)
OUTPATIENT PHYSICAL THERAPY NEURO EVALUATION   Patient Name: Kristen Mack MRN: [id number] DOB:[birth year], 76 y.o., female Today's Date: 09/16/2021  PCP: Sallee Lange  REFERRING PROVIDER: Sallee Lange    PT End of Session - 09/16/21 1456     Visit Number 4    Number of Visits 12    Date for PT Re-Evaluation 09/17/21    Authorization Type [employer]    Progress Note Due on Visit 10    PT Start Time 1455    PT Stop Time 1528    PT Time Calculation (min) 33 min    Activity Tolerance Patient tolerated treatment well    Behavior During Therapy WFL for tasks assessed/performed             Past Medical History:  Diagnosis Date   ADHD (attention deficit hyperactivity disorder) 2007   Adrenal adenoma, left    followed by dr nida   Arthritis    Asthma    followed by pcp   CFS (chronic fatigue syndrome)    CKD (chronic kidney disease), stage III [facility])    nephrologist---  dr Joelyn Oms   Depression    Edema of both lower extremities    Fatty liver disease, nonalcoholic    Fibromyalgia    GERD (gastroesophageal reflux disease)    Hiatal hernia    History of adenomatous polyp of colon    History of cardiac murmur    until age 52- pt has had echocardiograms   History of TIA (transient ischemic attack)    per CT scan    Hyperlipidemia    Hypertension    Hypothyroidism    endocrinologist-- dr g. nida   IBS (irritable bowel syndrome)    Meniere disease    OSA on CPAP    per pt uses nightly   Osteopenia    Pancreas cyst    followed by pcp   PMB (postmenopausal bleeding)    PVC's (premature ventricular contractions)    cardiologist--- dr Audie Box   Type 2 diabetes mellitus treated with insulin [facility])    endocrinologist-- dr g. nida;    (08-14-2021  per pt checks blood sugar QID ,  fasting average --- 73-150)   Urinary incontinence    Wears glasses    Wears partial dentures    upper   Past Surgical History:  Procedure Laterality Date   CERVICAL  POLYPECTOMY N/A 08/17/2021   Procedure: BIOPSY OF ENDOMETRIUM AND OR POLYPECTOMY;  Surgeon: Sanjuana Kava, MD;  Location: [place];  Service: Gynecology;  Laterality: N/A;   [place]   and  REPAIR UMBILICAL HERNIA   COLONOSCOPY N/A 04/19/2013   Procedure: COLONOSCOPY;  Surgeon: Rogene Houston, MD;  [facility];  Service: Endoscopy;  Laterality: N/A;     DILITATION & CURRETTAGE/HYSTROSCOPY WITH NOVASURE ABLATION N/A 08/17/2021   Procedure: DILATATION & CURETTAGE/HYSTEROSCOPY;  Surgeon: Sanjuana Kava, MD;  Location: [place];  Service: Gynecology;  Laterality: N/A;   ESOPHAGOGASTRODUODENOSCOPY  02/08/2003   HERNIA REPAIR  1995   HYSTEROSCOPY WITH D & C  11/14/2002   endometrial polypectomy   HYSTEROSCOPY WITH D & C N/A 04/05/2014   Procedure: DILATATION AND CURETTAGE /HYSTEROSCOPY, ENDOMETRIAL POLYPECTOMY;  Surgeon: Sanjuana Kava, MD;  Location: [place];  Service: Gynecology;  Laterality: N/A;   LIVER BIOPSY  01/29/2004   benign   TUBAL LIGATION  1979   Patient Active Problem  List   Diagnosis Date Noted   Type 2 DM with CKD stage 3 and hypertension ([place]) 08/10/2021   Trochanteric bursitis 06/05/2021   Bruising tendency 06/30/2020   Mixed hyperlipidemia 02/13/2020   Diabetes mellitus without complication ([place]) 33/82/5053   Sleep apnea 07/11/2017   Pancreatic pseudocyst 05/10/2017   Adrenal adenoma 05/10/2017   Osteoporosis 11/21/2016   Fibromyalgia 08/10/2016   Female stress incontinence 11/05/2015   NASH (nonalcoholic steatohepatitis) 04/24/2015   Orthostatic hypotension 09/19/2013   Elevated transaminase level 09/19/2013   HTN (hypertension), benign 09/03/2013   Depression 08/14/2013   Fatty liver 08/14/2013   Other specified hypothyroidism 10/23/2012   Osteopenia 07/08/2012   Morbid obesity ([place]) 07/08/2012   Urinary bladder incontinence 04/30/2012   Family history of  rectal cancer 10/04/2011   GERD (gastroesophageal reflux disease) 10/04/2011   IBS (irritable bowel syndrome) 10/04/2011    ONSET DATE: 04/07/2021  REFERRING DIAG: Muscle weakness with decreased balance  THERAPY DIAG:  Muscle weakness (generalized)  Repeated falls  SUBJECTIVE:                                                                                                                                                                                              SUBJECTIVE STATEMENT:    She performs exercises intermittently at home.     PERTINENT HISTORY:Type 2 DM with CKD stage 3 and hypertension ([place]   IMPRESSION:   PAIN: (we are not seeing pt for her chronic back pain and will not be addressing) Are you having pain? Yes 6.   Lower back   PRECAUTIONS: None  WEIGHT BEARING RESTRICTIONS No  FALLS: Has patient fallen in last 6 months? Yes. Number of falls 2  LIVING ENVIRONMENT: Lives with: lives with their family Lives in: House/apartment Stairs: Yes: External: 5 steps; on right going up goes up steps sideways  Has following equipment at home: Single point cane; four wheeled walker   PLOF: Independent with household mobility with device  PATIENT GOALS to have less back pain, improved balance   OBJECTIVE:    MMT:    MMT Right 08/06/2021 Left 08/06/2021  Hip flexion 3-/5 2+/5  Hip extension 3-/5 2/5  Hip abduction 3/5 4/5   Hip adduction    Hip internal rotation    Hip external rotation    Knee flexion 3+/5 3+/5   Knee extension 4/5 3/5  Ankle dorsiflexion 3/5 3/5   Ankle plantarflexion    Ankle inversion    Ankle eversion    (Blank rows = not tested)   FUNCTIONAL TESTs:  30 seconds chair stand test 10 x  Single leg  stance unable             2 minute walking test with 4 wheeled walker 232 ft   TODAY'S TREATMENT:  09/16/21 Nu step 4 minutes level 3 for warm up Alternating march 2x 10 bilateral Standing hip abduction 2x 10 bilateral  Lateral stepping  4x 10 feet 2 sets  STS 2x 10  Step up 4 inch step 2x 10  bilateral  Hip abduction isometric with belt in seated 10 x 5 second 2 sets Hip adduction isometric with ball in seated 10 x 5 second holds                 6/14:             Standing:             Heel raises x 10            Squat x 10            Side step at mat x 2 RT            Marching 5 reps x 2                          Sitting :            LAQ 3# x 10 B             Sit to stand x 10   09/07/21 LAQ 10 x 5 second holds bilateral  STS 2x 10  Standing hip abduction 2x 10 bilateral  Seated Ab set 10 x 5 second holds Standing HR/TR 1x 10 each Seated March 2x 10 5 second holds    EVAL Sitting: Sit to stand x 10 LAQ LT x 10  Resisted DF with green t band x 10  Ab set x 5    PATIENT EDUCATION: 6/14:  To complete exercises slowly and hold 3 seconds; rest when winded.  Education details: HEP Person educated: Patient Education method: Explanation Education comprehension: verbalized understanding   HOME EXERCISE PROGRAM: 6/14:  Added standing heel raises and squats.   Sit to stand x 10 LAQ LT x 10  Resisted DF with green t band x 10  Ab set x 5  Standing hip abduction    GOALS: Goals reviewed with patient? No  SHORT TERM GOALS: Target date: 08/27/2021   PT I in HEP to be able to come sit to stand I  Baseline: Goal status: IN PROGRESS  2.  PT to be able to single leg stance for 5 seconds B to decrease risk of falling  Baseline:  Goal status: IN PROGRESS    LONG TERM GOALS: Target date: 09/17/2021     PT to be I in advanced HEP to improve mm one grade to be able to ascend/descend 4 steps in a normal fashion holding      Onto a rail  Baseline:  Goal status: IN PROGRESS  2.  PT to be able to single leg stance for at least 10 seconds to decrease risk of falls  Baseline:  Goal status: IN PROGRESS  3.  PT to be able to come sit to stand 12 x in 30 seconds to be at average for age  Baseline:   Goal status: IN PROGRESS    ASSESSMENT:  CLINICAL IMPRESSION:   Began session with nu step for dynamic warm up and conditioning. Patient moderately fatigued following. Continued with LE strengthening  and balance training in which she tolerates additional reps of previously completed exercises. Patient progressing well. Patient will continue to benefit from physical therapy in order to improve function and reduce impairment.   OBJECTIVE IMPAIRMENTS decreased activity tolerance, decreased balance, decreased mobility, difficulty walking, decreased strength, and pain.   ACTIVITY LIMITATIONS cleaning and community activity.   PERSONAL FACTORS Age, Fitness, and 1-2 comorbidities: DM, HTN, CKD  are also affecting patient's functional outcome.    REHAB POTENTIAL: Good  CLINICAL DECISION MAKING: Stable/uncomplicated  EVALUATION COMPLEXITY: Moderate  PLAN: PT FREQUENCY: 2x/week  PT DURATION: 6 weeks  PLANNED INTERVENTIONS: Therapeutic exercises, Therapeutic activity, Neuromuscular re-education, Balance training, Gait training, and Patient/Family education  PLAN FOR NEXT SESSION: Progress core, LE and balance exercises   3:18 PM, 09/16/21 Mearl Latin PT, DPT Physical Therapist at [facility]

## 2021-09-17 ENCOUNTER — Institutional Professional Consult (permissible substitution): Payer: HMO | Admitting: Pulmonary Disease

## 2021-09-21 ENCOUNTER — Encounter: Payer: Self-pay | Admitting: "Endocrinology

## 2021-09-22 ENCOUNTER — Ambulatory Visit (HOSPITAL_COMMUNITY): Payer: HMO | Admitting: Physical Therapy

## 2021-09-22 DIAGNOSIS — M6281 Muscle weakness (generalized): Secondary | ICD-10-CM | POA: Diagnosis not present

## 2021-09-22 DIAGNOSIS — R296 Repeated falls: Secondary | ICD-10-CM

## 2021-09-22 DIAGNOSIS — R278 Other lack of coordination: Secondary | ICD-10-CM

## 2021-09-24 ENCOUNTER — Encounter (HOSPITAL_COMMUNITY): Payer: HMO | Admitting: Physical Therapy

## 2021-09-28 ENCOUNTER — Ambulatory Visit (HOSPITAL_COMMUNITY): Payer: HMO

## 2021-09-28 ENCOUNTER — Ambulatory Visit (INDEPENDENT_AMBULATORY_CARE_PROVIDER_SITE_OTHER): Payer: HMO | Admitting: Family Medicine

## 2021-09-28 ENCOUNTER — Encounter: Payer: Self-pay | Admitting: Family Medicine

## 2021-09-28 ENCOUNTER — Ambulatory Visit (HOSPITAL_COMMUNITY)
Admission: RE | Admit: 2021-09-28 | Discharge: 2021-09-28 | Disposition: A | Discharge status: Home / Self Care | Payer: HMO | Source: Ambulatory Visit | Attending: Family Medicine | Admitting: Family Medicine

## 2021-09-28 ENCOUNTER — Other Ambulatory Visit (HOSPITAL_COMMUNITY)
Admission: RE | Admit: 2021-09-28 | Discharge: 2021-09-28 | Disposition: A | Discharge status: Home / Self Care | Payer: HMO | Source: Home / Self Care

## 2021-09-28 VITALS — BP 142/84 | Wt 219.4 lb

## 2021-09-28 DIAGNOSIS — M5431 Sciatica, right side: Secondary | ICD-10-CM | POA: Diagnosis not present

## 2021-09-28 DIAGNOSIS — R600 Localized edema: Secondary | ICD-10-CM | POA: Diagnosis not present

## 2021-09-28 DIAGNOSIS — M79604 Pain in right leg: Secondary | ICD-10-CM

## 2021-09-28 DIAGNOSIS — N184 Chronic kidney disease, stage 4 (severe): Secondary | ICD-10-CM | POA: Diagnosis not present

## 2021-09-28 DIAGNOSIS — E038 Other specified hypothyroidism: Secondary | ICD-10-CM | POA: Diagnosis not present

## 2021-09-28 DIAGNOSIS — E119 Type 2 diabetes mellitus without complications: Secondary | ICD-10-CM | POA: Diagnosis not present

## 2021-09-28 LAB — D-DIMER, QUANTITATIVE: D-Dimer, Quant: 0.61 ug/mL-FEU — ABNORMAL HIGH (ref 0.00–0.50)

## 2021-09-28 MED ORDER — TRAMADOL HCL 50 MG PO TABS: ORAL_TABLET | ORAL | 0 refills | Status: DC

## 2021-09-28 NOTE — Progress Notes (Unsigned)
   Subjective:    Patient ID: Kristen Mack, female    DOB: 1945/04/28, 76 y.o.   MRN: [id number]  HPI Pt arrives today for right leg pain. Per daughter has been going on since 31 years old; worsened over the past 2 days. Pt states pain has been intermittently. When pain is worse it is a 9. Pt took a Tramadol and a muscle relaxer. Muscle relaxer did help pt sleep. Pain then returned and felt like someone "was hitting her with brick bat". Uncomfortable to sit; pain is along side of right calf. Pt has been having some tenderness. Swelling in lower leg.    Review of Systems     Objective:   Physical Exam Increased circumference in the right leg compared to the left leg by 1 inch tenderness in the right calf Subjective discomfort from the low back to the hip to the right leg to the foot consistent with sciatica negative straight leg raise       Assessment & Plan:  Sciatica Tramadol as needed 1 twice daily as needed due to underlying renal issues Gentle stretching Need to rule out DVT D-dimer ordered Ultrasound stat ordered await results

## 2021-09-29 LAB — COMPREHENSIVE METABOLIC PANEL
ALT: 25 IU/L (ref 0–32)
AST: 24 IU/L (ref 0–40)
Albumin/Globulin Ratio: 1.2 (ref 1.2–2.2)
Albumin: 4.3 g/dL (ref 3.7–4.7)
Alkaline Phosphatase: 106 IU/L (ref 44–121)
BUN/Creatinine Ratio: 16 (ref 12–28)
BUN: 25 mg/dL (ref 8–27)
Bilirubin Total: 0.3 mg/dL (ref 0.0–1.2)
CO2: 19 mmol/L — ABNORMAL LOW (ref 20–29)
Calcium: 10 mg/dL (ref 8.7–10.3)
Chloride: 104 mmol/L (ref 96–106)
Creatinine, Ser: 1.56 mg/dL — ABNORMAL HIGH (ref 0.57–1.00)
Globulin, Total: 3.7 g/dL (ref 1.5–4.5)
Glucose: 138 mg/dL — ABNORMAL HIGH (ref 70–99)
Potassium: 3.7 mmol/L (ref 3.5–5.2)
Sodium: 141 mmol/L (ref 134–144)
Total Protein: 8 g/dL (ref 6.0–8.5)
eGFR: 34 mL/min/{1.73_m2} — ABNORMAL LOW (ref >59)

## 2021-09-29 LAB — TSH: TSH: 16.9 u[IU]/mL — ABNORMAL HIGH (ref 0.450–4.500)

## 2021-09-29 LAB — T4, FREE: Free T4: 1.19 ng/dL (ref 0.82–1.77)

## 2021-09-30 ENCOUNTER — Other Ambulatory Visit: Payer: Self-pay

## 2021-09-30 DIAGNOSIS — R7989 Other specified abnormal findings of blood chemistry: Secondary | ICD-10-CM

## 2021-09-30 DIAGNOSIS — M79604 Pain in right leg: Secondary | ICD-10-CM

## 2021-10-01 ENCOUNTER — Ambulatory Visit (HOSPITAL_COMMUNITY): Payer: HMO | Admitting: Physical Therapy

## 2021-10-01 ENCOUNTER — Telehealth: Payer: Self-pay | Admitting: Family Medicine

## 2021-10-01 ENCOUNTER — Encounter: Payer: Self-pay | Admitting: Family Medicine

## 2021-10-01 NOTE — Telephone Encounter (Signed)
Gabapentin 100 mg 1 twice daily #60 with 3 refills MyChart message was sent to the patient Please send to [facility] thank you

## 2021-10-01 NOTE — Telephone Encounter (Signed)
Patient's daughter sent a my chart message requesting gabapentin for her leg pain to be called into [facility]- She called this afternoon checking on prescription has been called in yet.[place] please advise

## 2021-10-02 ENCOUNTER — Telehealth: Payer: Self-pay | Admitting: Family Medicine

## 2021-10-02 ENCOUNTER — Other Ambulatory Visit: Payer: Self-pay

## 2021-10-02 ENCOUNTER — Ambulatory Visit (HOSPITAL_COMMUNITY)
Admission: RE | Admit: 2021-10-02 | Discharge: 2021-10-02 | Disposition: A | Discharge status: Home / Self Care | Payer: HMO | Source: Ambulatory Visit | Attending: Family Medicine | Admitting: Family Medicine

## 2021-10-02 DIAGNOSIS — M79604 Pain in right leg: Secondary | ICD-10-CM | POA: Diagnosis not present

## 2021-10-02 DIAGNOSIS — M7989 Other specified soft tissue disorders: Secondary | ICD-10-CM | POA: Diagnosis not present

## 2021-10-02 DIAGNOSIS — R7989 Other specified abnormal findings of blood chemistry: Secondary | ICD-10-CM | POA: Insufficient documentation

## 2021-10-02 MED ORDER — GABAPENTIN 100 MG PO CAPS: 100.0000 mg | ORAL_CAPSULE | Freq: Two times a day (BID) | ORAL | 3 refills | Status: DC

## 2021-10-02 NOTE — Telephone Encounter (Signed)
Patient informed of md message and recommendations. Verbalized understanding.  FYI: Patient wanted to let you know that her insurance servers are down and she is not able to get the gabapentin right now , so she is just taking the tramadol.

## 2021-10-02 NOTE — Telephone Encounter (Signed)
Patient wants to know if she can take her  tramadol 50 mg with gabapentin 100 mg.  Please advise

## 2021-10-02 NOTE — Telephone Encounter (Signed)
In a small number of people on the combination of the 2 medications could cause increased drowsiness the majority of people tolerate it okay but if she finds that the gabapentin is making the tramadol too strong we would have to stop the gabapentin

## 2021-10-02 NOTE — Telephone Encounter (Signed)
Prescription sent per drs notes

## 2021-10-03 ENCOUNTER — Other Ambulatory Visit: Payer: Self-pay | Admitting: Family Medicine

## 2021-10-05 ENCOUNTER — Encounter: Payer: Self-pay | Admitting: Family Medicine

## 2021-10-05 DIAGNOSIS — E1122 Type 2 diabetes mellitus with diabetic chronic kidney disease: Secondary | ICD-10-CM | POA: Diagnosis not present

## 2021-10-05 DIAGNOSIS — R350 Frequency of micturition: Secondary | ICD-10-CM | POA: Diagnosis not present

## 2021-10-05 DIAGNOSIS — N1832 Chronic kidney disease, stage 3b: Secondary | ICD-10-CM | POA: Diagnosis not present

## 2021-10-05 DIAGNOSIS — I129 Hypertensive chronic kidney disease with stage 1 through stage 4 chronic kidney disease, or unspecified chronic kidney disease: Secondary | ICD-10-CM | POA: Diagnosis not present

## 2021-10-05 MED ORDER — TIZANIDINE HCL 2 MG PO TABS: ORAL_TABLET | ORAL | 1 refills | Status: DC

## 2021-10-05 NOTE — Telephone Encounter (Signed)
Nurses I would recommend tizanidine 2 mg 1 twice daily as needed do not exceed twice per day, #60  At this dosage it is only meant to do so temporarily and not long-term caution drowsiness  Also if not improving over the next 7 to 10 days I recommend physical therapy-keep [place] posted  Thanks-Dr. Nicki Reaper

## 2021-10-07 ENCOUNTER — Telehealth: Payer: Self-pay

## 2021-10-07 ENCOUNTER — Other Ambulatory Visit: Payer: Self-pay | Admitting: Family Medicine

## 2021-10-07 ENCOUNTER — Ambulatory Visit: Payer: Self-pay | Admitting: *Deleted

## 2021-10-07 DIAGNOSIS — E1122 Type 2 diabetes mellitus with diabetic chronic kidney disease: Secondary | ICD-10-CM

## 2021-10-07 DIAGNOSIS — I10 Essential (primary) hypertension: Secondary | ICD-10-CM

## 2021-10-07 MED ORDER — TIZANIDINE HCL 2 MG PO TABS: ORAL_TABLET | ORAL | 1 refills | Status: DC

## 2021-10-07 NOTE — Telephone Encounter (Signed)
Caller name:Joni Delford Field   On DPR? :Yes  Call back number:[phone]  Provider they see: [place]   Reason for call:Pt spoke to Dr Nicki Reaper on [place] he increased the tiZANidine (ZANAFLEX) 2 MG tablet but no medication was called into the pharmacy   [place], [place] - [address]  [address] [place], [place] [address]

## 2021-10-07 NOTE — Telephone Encounter (Signed)
Medication is on hold at pharmacy because it was too early to fill.  [place]

## 2021-10-07 NOTE — Chronic Care Management (AMB) (Signed)
   10/07/2021  Kristen Mack [birth year] [id number]   Patient cancelled last scheduled appointment with RN care manager and did not reschedule, care plan updated and resolved, case closed.  Jacqlyn Larsen [facility], BSN RN Case Manager [facility] [phone]

## 2021-10-07 NOTE — Telephone Encounter (Signed)
Patient would like to have the directions on her tizanidine changed to 1 bid per previous mychart message , instead of 1 qhs , insurance will not cover it to be filled till 7/23 at 1 qhs.

## 2021-10-07 NOTE — Telephone Encounter (Signed)
Corrected prescription was sent to [facility]

## 2021-10-08 NOTE — Telephone Encounter (Signed)
Please see other telephone message

## 2021-10-08 NOTE — Telephone Encounter (Signed)
Pt contacted and verbalized understanding.  

## 2021-10-11 DIAGNOSIS — E1122 Type 2 diabetes mellitus with diabetic chronic kidney disease: Secondary | ICD-10-CM | POA: Diagnosis not present

## 2021-10-12 ENCOUNTER — Ambulatory Visit: Payer: HMO | Admitting: "Endocrinology

## 2021-10-19 ENCOUNTER — Encounter: Payer: Self-pay | Admitting: Family Medicine

## 2021-10-26 ENCOUNTER — Encounter: Payer: Self-pay | Admitting: "Endocrinology

## 2021-10-26 ENCOUNTER — Ambulatory Visit (INDEPENDENT_AMBULATORY_CARE_PROVIDER_SITE_OTHER): Payer: HMO | Admitting: "Endocrinology

## 2021-10-26 VITALS — BP 132/84 | HR 60 | Wt 214.4 lb

## 2021-10-26 DIAGNOSIS — I129 Hypertensive chronic kidney disease with stage 1 through stage 4 chronic kidney disease, or unspecified chronic kidney disease: Secondary | ICD-10-CM | POA: Diagnosis not present

## 2021-10-26 DIAGNOSIS — D3502 Benign neoplasm of left adrenal gland: Secondary | ICD-10-CM

## 2021-10-26 DIAGNOSIS — E782 Mixed hyperlipidemia: Secondary | ICD-10-CM | POA: Diagnosis not present

## 2021-10-26 DIAGNOSIS — N183 Chronic kidney disease, stage 3 unspecified: Secondary | ICD-10-CM | POA: Diagnosis not present

## 2021-10-26 DIAGNOSIS — E1122 Type 2 diabetes mellitus with diabetic chronic kidney disease: Secondary | ICD-10-CM

## 2021-10-26 DIAGNOSIS — E038 Other specified hypothyroidism: Secondary | ICD-10-CM | POA: Diagnosis not present

## 2021-10-26 LAB — POCT GLYCOSYLATED HEMOGLOBIN (HGB A1C): HbA1c, POC (controlled diabetic range): 7 % (ref 0.0–7.0)

## 2021-10-26 MED ORDER — DENOSUMAB 60 MG/ML ~~LOC~~ SOSY: 60.0000 mg | PREFILLED_SYRINGE | Freq: Once | SUBCUTANEOUS | 0 refills | Status: AC

## 2021-10-26 NOTE — Patient Instructions (Signed)
                                     Advice for Weight Management  -For most of us the best way to lose weight is by diet management. Generally speaking, diet management means consuming less calories intentionally which over time brings about progressive weight loss.  This can be achieved more effectively by avoiding ultra processed carbohydrates, processed meats, unhealthy fats.    It is critically important to know your numbers: how much calorie you are consuming and how much calorie you need. More importantly, our carbohydrates sources should be unprocessed naturally occurring  complex starch food items.  It is always important to balance nutrition also by  appropriate intake of proteins (mainly plant-based), healthy fats/oils, plenty of fruits and vegetables.   -The American College of Lifestyle Medicine (ACL M) recommends nutrition derived mostly from Whole Food, Plant Predominant Sources example an apple instead of applesauce or apple pie. Eat Plenty of vegetables, Mushrooms, fruits, Legumes, Whole Grains, Nuts, seeds in lieu of processed meats, processed snacks/pastries red meat, poultry, eggs.  Use only water or unsweetened tea for hydration.  The College also recommends the need to stay away from risky substances including alcohol, smoking; obtaining 7-9 hours of restorative sleep, at least 150 minutes of moderate intensity exercise weekly, importance of healthy social connections, and being mindful of stress and seek help when it is overwhelming.    -Sticking to a routine mealtime to eat 3 meals a day and avoiding unnecessary snacks is shown to have a big role in weight control. Under normal circumstances, the only time we burn stored energy is when we are hungry, so allow  some hunger to take place- hunger means no food between appropriate meal times, only water.  It is not advisable to starve.   -It is better to avoid simple carbohydrates including:  Cakes, Sweet Desserts, Ice Cream, Soda (diet and regular), Sweet Tea, Candies, Chips, Cookies, Store Bought Juices, Alcohol in Excess of  1-2 drinks a day, Lemonade,  Artificial Sweeteners, Doughnuts, Coffee Creamers, "Sugar-free" Products, etc, etc.  This is not a complete list.....    -Consulting with certified diabetes educators is proven to provide you with the most accurate and current information on diet.  Also, you may be  interested in discussing diet options/exchanges , we can schedule a visit with Kristen Mack, RDN, CDE for individualized nutrition education.  -Exercise: If you are able: 30 -60 minutes a day ,4 days a week, or 150 minutes of moderate intensity exercise weekly.    The longer the better if tolerated.  Combine stretch, strength, and aerobic activities.  If you were told in the past that you have high risk for cardiovascular diseases, or if you are currently symptomatic, you may seek evaluation by your heart doctor prior to initiating moderate to intense exercise programs.                                  Additional Care Considerations for Diabetes/Prediabetes   -Diabetes  is a chronic disease.  The most important care consideration is regular follow-up with your diabetes care provider with the goal being avoiding or delaying its complications and to take advantage of advances in medications and technology.  If appropriate actions are taken early enough, type 2 diabetes can even be   reversed.  Seek information from the right source.  - Whole Food, Plant Predominant Nutrition is highly recommended: Eat Plenty of vegetables, Mushrooms, fruits, Legumes, Whole Grains, Nuts, seeds in lieu of processed meats, processed snacks/pastries red meat, poultry, eggs as recommended by American College of  Lifestyle Medicine (ACLM).  -Type 2 diabetes is known to coexist with other important comorbidities such as high blood pressure and high cholesterol.  It is critical to control not only the  diabetes but also the high blood pressure and high cholesterol to minimize and delay the risk of complications including coronary artery disease, stroke, amputations, blindness, etc.  The good news is that this diet recommendation for type 2 diabetes is also very helpful for managing high cholesterol and high blood blood pressure.  - Studies showed that people with diabetes will benefit from a class of medications known as ACE inhibitors and statins.  Unless there are specific reasons not to be on these medications, the standard of care is to consider getting one from these groups of medications at an optimal doses.  These medications are generally considered safe and proven to help protect the heart and the kidneys.    - People with diabetes are encouraged to initiate and maintain regular follow-up with eye doctors, foot doctors, dentists , and if necessary heart and kidney doctors.     - It is highly recommended that people with diabetes quit smoking or stay away from smoking, and get yearly  flu vaccine and pneumonia vaccine at least every 5 years.  See above for additional recommendations on exercise, sleep, stress management , and healthy social connections.      

## 2021-10-26 NOTE — Progress Notes (Signed)
10/26/2021, 6:34 PM     Endocrinology follow-up note   Subjective:    Patient ID: Kristen Mack, female    DOB: Oct 29, 1945, PCP Kathyrn Drown, MD   Past Medical History:  Diagnosis Date   ADHD (attention deficit hyperactivity disorder) 2007   Adrenal adenoma, left    followed by dr Charlii Yost   Arthritis    Asthma    followed by pcp   CFS (chronic fatigue syndrome)    CKD (chronic kidney disease), stage III [facility])    nephrologist---  dr Joelyn Oms   Depression    Edema of both lower extremities    Fatty liver disease, nonalcoholic    Fibromyalgia    GERD (gastroesophageal reflux disease)    Hiatal hernia    History of adenomatous polyp of colon    History of cardiac murmur    until age 38- pt has had echocardiograms   History of TIA (transient ischemic attack)    per CT scan    Hyperlipidemia    Hypertension    Hypothyroidism    endocrinologist-- dr g. Eduardo Honor   IBS (irritable bowel syndrome)    Meniere disease    OSA on CPAP    per pt uses nightly   Osteopenia    Pancreas cyst    followed by pcp   PMB (postmenopausal bleeding)    PVC's (premature ventricular contractions)    cardiologist--- dr Audie Box   Type 2 diabetes mellitus treated with insulin [facility])    endocrinologist-- dr g. Glendora Clouatre;    (08-14-2021  per pt checks blood sugar QID ,  fasting average --- 73-150)   Urinary incontinence    Wears glasses    Wears partial dentures    upper   Past Surgical History:  Procedure Laterality Date   CERVICAL POLYPECTOMY N/A 08/17/2021   Procedure: BIOPSY OF ENDOMETRIUM AND OR POLYPECTOMY;  Surgeon: Sanjuana Kava, MD;  Location: [place];  Service: Gynecology;  Laterality: N/A;   [place]   and  REPAIR UMBILICAL HERNIA   COLONOSCOPY N/A 04/19/2013   Procedure: COLONOSCOPY;  Surgeon: Rogene Houston, MD;  [facility];  Service: Endoscopy;   Laterality: N/A;     DILITATION & CURRETTAGE/HYSTROSCOPY WITH NOVASURE ABLATION N/A 08/17/2021   Procedure: DILATATION & CURETTAGE/HYSTEROSCOPY;  Surgeon: Sanjuana Kava, MD;  Location: [place];  Service: Gynecology;  Laterality: N/A;   ESOPHAGOGASTRODUODENOSCOPY  02/08/2003   HERNIA REPAIR  1995   HYSTEROSCOPY WITH D & C  11/14/2002   endometrial polypectomy   HYSTEROSCOPY WITH D & C N/A 04/05/2014   Procedure: DILATATION AND CURETTAGE /HYSTEROSCOPY, ENDOMETRIAL POLYPECTOMY;  Surgeon: Sanjuana Kava, MD;  Location: [place];  Service: Gynecology;  Laterality: N/A;   LIVER BIOPSY  01/29/2004   benign   TUBAL LIGATION  1979   Social History   Socioeconomic History   Marital status: Married    Spouse name: Herbie Baltimore   Number of children: 2   Years of education: Not on file   Highest education level: Not on file  Occupational History   Occupation: retired, Therapist, sports  Tobacco Use   Smoking  status: Former    Packs/day: 1.50    Years: 3.00    Total pack years: 4.50    Types: Cigarettes    Quit date: 10/03/1968    Years since quitting: 53.0   Smokeless tobacco: Never  Vaping Use   Vaping Use: Never used  Substance and Sexual Activity   Alcohol use: No   Drug use: No   Sexual activity: Not Currently    Birth control/protection: None  Other Topics Concern   Not on file  Social History Narrative   2 daughters, Amy and Sharyn Lull. Amy lives with parents. Sharyn Lull lives in [place].   Social Determinants of Health   Financial Resource Strain: Low Risk  (12/30/2020)   Overall Financial Resource Strain (CARDIA)    Difficulty of Paying Living Expenses: Not hard at all  Food Insecurity: No Food Insecurity (07/15/2021)   Hunger Vital Sign    Worried About Running Out of Food in the Last Year: Never true    Ran Out of Food in the Last Year: Never true  Transportation Needs: No Transportation Needs (07/15/2021)   PRAPARE - Hydrologist  (Medical): No    Lack of Transportation (Non-Medical): No  Physical Activity: Insufficiently Active (12/30/2020)   Exercise Vital Sign    Days of Exercise per Week: 3 days    Minutes of Exercise per Session: 20 min  Stress: No Stress Concern Present (12/30/2020)   [place]    Feeling of Stress : Not at all  Social Connections: [place] (12/30/2020)   Social Connection and Isolation Panel [NHANES]    Frequency of Communication with Friends and Family: More than three times a week    Frequency of Social Gatherings with Friends and Family: More than three times a week    Attends Religious Services: 1 to 4 times per year    Active Member of [employer] or Organizations: Yes    Attends Archivist Meetings: 1 to 4 times per year    Marital Status: Married   Outpatient Encounter Medications as of 10/26/2021  Medication Sig   denosumab (PROLIA) 60 MG/ML SOSY injection Inject 60 mg into the skin once for 1 dose.   albuterol (VENTOLIN HFA) 108 (90 Base) MCG/ACT inhaler INHALE 2 PUFFS INTO LUNGS EVERY 6 HOURS AS NEEDED FOR SHORTNESS OF BREATH (Patient taking differently: 2 puffs every 6 (six) hours as needed. INHALE 2 PUFFS INTO LUNGS EVERY 6 HOURS AS NEEDED FOR SHORTNESS OF BREATH)   Ascorbic Acid (VITAMIN C) 1000 MG tablet Take 6,000 mg by mouth daily.    Azelastine HCl 137 MCG/SPRAY SOLN USE 2 SPRAY(S) IN EACH NOSTRIL TWICE DAILY (Patient taking differently: Place 2 sprays into the nose 2 (two) times daily.)   B Complex Vitamins (B COMPLEX 100 PO) Take 1 tablet by mouth daily.   blood glucose meter kit and supplies Dispense based on patient and insurance preference. Use to test sugars BID  (FOR ICD-10 E10.9, E11.9).   buPROPion (WELLBUTRIN XL) 300 MG 24 hr tablet Take 1 tablet (300 mg total) by mouth daily. (Patient taking differently: Take 300 mg by mouth at bedtime.)   Chromium Picolinate 1000 MCG TABS Take 1 tablet  by mouth daily.   Coenzyme Q10 (COQ10) 200 MG CAPS Take 200 mg by mouth daily.   Continuous Blood Gluc Receiver (FREESTYLE LIBRE 2 READER) DEVI As directed   Continuous Blood Gluc Sensor (FREESTYLE LIBRE 2 SENSOR) MISC  1 Piece by Does not apply route every 14 (fourteen) days.   Cyanocobalamin (B-12) 1000 MCG SUBL Place 1,000 mcg under the tongue daily.   DIGESTIVE ENZYMES PO Take 1 tablet by mouth 3 (three) times daily.    diltiazem (CARDIZEM CD) 120 MG 24 hr capsule Take 1 capsule by mouth once daily (Patient taking differently: 120 mg at bedtime.)   empagliflozin (JARDIANCE) 10 MG TABS tablet TAKE 1 TABLET BY MOUTH ONCE DAILY BEFORE BREAKFAST   gabapentin (NEURONTIN) 100 MG capsule Take 1 capsule (100 mg total) by mouth 2 (two) times daily.   glucose blood test strip Use to check blood glucose four times daily as instructed   insulin detemir (LEVEMIR FLEXPEN) 100 UNIT/ML FlexPen Inject 50 Units into the skin at bedtime. (Patient taking differently: Inject 50 Units into the skin at bedtime.)   ipratropium (ATROVENT) 0.02 % nebulizer solution USE ONE VIAL IN NEBULIZER 4 TIMES DAILY AS NEEDED FOR SHORTNESS OF BREATH (Patient taking differently: every 4 (four) hours as needed. USE ONE VIAL IN NEBULIZER 4 TIMES DAILY AS NEEDED FOR SHORTNESS OF BREATH)   levothyroxine (SYNTHROID) 125 MCG tablet TAKE 1 TABLET BY MOUTH DAILY BEFORE BREAKFAST (Patient taking differently: Take 125 mcg by mouth daily before breakfast.)   Lysine HCl 500 MG TABS Take 500 mg by mouth daily.   Magnesium 250 MG TABS Take 250 mg by mouth 2 (two) times daily.   meclizine (ANTIVERT) 25 MG tablet Take 25 mg by mouth 3 (three) times daily as needed for dizziness.   montelukast (SINGULAIR) 10 MG tablet TAKE 1 TABLET BY MOUTH AT BEDTIME (Patient taking differently: Take 10 mg by mouth at bedtime.)   OVER THE COUNTER MEDICATION Take 1 Scoop by mouth 3 (three) times daily with meals. Fennel seeds   OVER THE COUNTER MEDICATION Take 1  tablet by mouth at bedtime as needed (sleep). Sleep essentials   rosuvastatin (CRESTOR) 5 MG tablet TAKE 1 TABLET BY MOUTH ON MONDAY, WEDNESDAY,  FRIDAY AND SATURDAY   TART CHERRY PO Take 1 tablet by mouth in the morning, at noon, and at bedtime.   tiZANidine (ZANAFLEX) 2 MG tablet 1 twice daily  prn caution drowsiness   traMADol (ULTRAM) 50 MG tablet Take 1 tablet by mouth twice daily as needed for pain   Vitamin A 3 MG (10000 UT) TABS Take 10,000 Units by mouth daily.   vitamin E 180 MG (400 UNITS) capsule Take 800 Units by mouth daily.    [DISCONTINUED] Meloxicam 7.5 MG TBDP Take 1 tablet by mouth every 12 (twelve) hours as needed (pain).   Facility-Administered Encounter Medications as of 10/26/2021  Medication   gentamicin (GARAMYCIN) 330 mg in dextrose 5 % 100 mL IVPB   ALLERGIES: Allergies  Allergen Reactions   Ambien [Zolpidem Tartrate] Other (See Comments)    Side effect "lost a couple days"; memory loss   Cefzil [Cefprozil] Diarrhea   Codeine Itching and Nausea And Vomiting   Demerol [Meperidine] Other (See Comments)    "messes up senses"   Formaldehyde    Latex Other (See Comments)    "skin gets raw"   Sulfa Antibiotics Other (See Comments)    Severe abd cramp   Celexa [Citalopram Hydrobromide] Other (See Comments)    Mouth sores    VACCINATION STATUS: Immunization History  Administered Date(s) Administered   Fluad Quad(high Dose 65+) 01/22/2020, 02/10/2021   Influenza, High Dose Seasonal PF 04/15/2018, 01/13/2019   Influenza,inj,Quad PF,6+ Mos 03/02/2016, 03/03/2017   Influenza-Unspecified 04/15/2018,  01/15/2019   Moderna Sars-Covid-2 Vaccination 05/23/2019, 06/19/2019, 03/05/2020   Pneumococcal Conjugate-13 05/22/2014   Pneumococcal Polysaccharide-23 03/03/2017   Tdap 04/18/2020   Zoster Recombinat (Shingrix) 12/08/2018, 04/27/2019    HPI ZAINA JENKIN is 76 y.o. female who is accompanied by her daughter to clinic.  She usually follows in this clinic  for stable, nonfunctioning 1.5 cm left adrenal adenoma which was previously worked up completely.  She also has hypothyroidism, type 2 diabetes followed in this clinic.  She remains on Jardiance 10 mg p.o. daily at breakfast, Levemir 50 units nightly.  She presents with her CGM device today showing 73% time range, 25% level 1 hyperglycemia, 2% level 2 hyperglycemia.  No hypoglycemia.  Her point-of-care A1c is 7% improving from 12.5%.   She remains on levothyroxine 125 mcg p.o. daily before breakfast.  She reports interruption of her levothyroxine for at least a week prior to her lab work.  Her previsit labs show evidence of under replacement.    She has no new complaints today. -In April 2022,  she underwent surveillance MRI abdomen/pelvis which documented stable adrenal finding.  This study was done to follow-up pancreatic cyst which was also reported to be stable and favoring benign findings.   -Her  previous 24-hour urine function studies for adrenal function have been within normal limits .  -She denies history of difficult to control hypertension, in fact has history of orthostatic hypotension.  She denies spells of palpitations, headaches, sweating. -She denies close  family history of pituitary, adrenal dysfunctions.  Review of Systems  Limited as above.  Objective:    BP 132/84   Pulse 60   Wt 214 lb 6.4 oz (97.3 kg)   BMI 43.30 kg/m   Wt Readings from Last 3 Encounters:  10/26/21 214 lb 6.4 oz (97.3 kg)  09/28/21 219 lb 6.4 oz (99.5 kg)  08/31/21 214 lb 12.8 oz (97.4 kg)    Recent Results (from the past 2160 hour(s))  Type and screen     Status: None   Collection Time: 08/13/21  2:39 PM  Result Value Ref Range   ABO/RH(D) A POS    Antibody Screen NEG    Sample Expiration 08/20/2021,2359    Extend sample reason      NO TRANSFUSIONS OR PREGNANCY IN THE PAST 3 MONTHS Performed at [place] [address], [place], [place] [address]   Basic  metabolic panel     Status: Abnormal   Collection Time: 08/13/21  2:41 PM  Result Value Ref Range   Sodium 139 135 - 145 mmol/L   Potassium 3.1 (L) 3.5 - 5.1 mmol/L   Chloride 106 98 - 111 mmol/L   CO2 23 22 - 32 mmol/L   Glucose, Bld 183 (H) 70 - 99 mg/dL    Comment: Glucose reference range applies only to samples taken after fasting for at least 8 hours.   BUN 24 (H) 8 - 23 mg/dL   Creatinine, Ser 1.62 (H) 0.44 - 1.00 mg/dL   Calcium 9.2 8.9 - 10.3 mg/dL   GFR, Estimated 33 (L) >60 mL/min    Comment: (NOTE) Calculated using the CKD-EPI Creatinine Equation (2021)    Anion gap 10 5 - 15    Comment: Performed at [facility], [place] [address], [place], [place] [address]  CBC WITH DIFFERENTIAL     Status: None   Collection Time: 08/13/21  2:41 PM  Result Value Ref Range   WBC 8.2 4.0 -  10.5 K/uL   RBC 4.51 3.87 - 5.11 MIL/uL   Hemoglobin 12.5 12.0 - 15.0 g/dL   HCT 38.4 36.0 - 46.0 %   MCV 85.1 80.0 - 100.0 fL   MCH 27.7 26.0 - 34.0 pg   MCHC 32.6 30.0 - 36.0 g/dL   RDW 15.1 11.5 - 15.5 %   Platelets 337 150 - 400 K/uL   nRBC 0.0 0.0 - 0.2 %   Neutrophils Relative % 69 %   Neutro Abs 5.7 1.7 - 7.7 K/uL   Lymphocytes Relative 18 %   Lymphs Abs 1.5 0.7 - 4.0 K/uL   Monocytes Relative 9 %   Monocytes Absolute 0.7 0.1 - 1.0 K/uL   Eosinophils Relative 3 %   Eosinophils Absolute 0.3 0.0 - 0.5 K/uL   Basophils Relative 1 %   Basophils Absolute 0.1 0.0 - 0.1 K/uL   Immature Granulocytes 0 %   Abs Immature Granulocytes 0.03 0.00 - 0.07 K/uL    Comment: Performed at [facility], [place] [address] [place], [place] [address]  I-STAT, [place] Cl[place] 8     Status: Abnormal   Collection Time: 08/17/21  2:15 PM  Result Value Ref Range   Sodium 143 135 - 145 mmol/L   Potassium 3.5 3.5 - 5.1 mmol/L   Chloride 107 98 - 111 mmol/L   BUN 21 8 - 23 mg/dL   Creatinine, Ser 1.70 (H) 0.44 - 1.00 mg/dL   Glucose, Bld 120 (H) 70 - 99 mg/dL    Comment: Glucose  reference range applies only to samples taken after fasting for at least 8 hours.   Calcium, Ion 1.29 1.15 - 1.40 mmol/L   TCO2 24 22 - 32 mmol/L   Hemoglobin 13.9 12.0 - 15.0 g/dL   HCT 41.0 36.0 - 46.0 %  Surgical pathology     Status: None   Collection Time: 08/17/21  3:11 PM  Result Value Ref Range   SURGICAL PATHOLOGY      SURGICAL PATHOLOGY CASE: [id number] PATIENT: [facility]     Clinical History: Endometrium thickened (crm)     FINAL MICROSCOPIC DIAGNOSIS:  A. ENDOMETRIUM, POLYPECTOMY: Bulky benign endometrial polyps with scattered foci of complex hyperplasia without cytologic atypia within the stroma of the polyps. Negative for malignancy.   GROSS DESCRIPTION:  Received in formalin are 3.5 x 3.5 x 0.8 cm of soft tan-pink tissue and blood-tinged mucus.  The specimen is entirely submitted in 6 cassettes. [facility] 08/18/2021)   Final Diagnosis performed by Unknown Jim, MD.   Electronically signed 08/19/2021 Technical component performed at [facility], [place] [address] [place], [place] [address].  Professional component performed at [employer]. [facility], [place] [address] [place], [place] [address].  Immunohistochemistry Technical component (if applicable) was performed at [facility]. [address], [place], [place],  [address].   IMMUNOHISTOCHEMISTRY DISCLAIMER (if applicable): Some of these immunohistochemical stains may have been developed and the performance characteristics determine by [facility]. Some may not have been cleared or approved by the U.S. Food and Drug Administration. The FDA has determined that such clearance or approval is not necessary. This test is used for clinical purposes. It should not be regarded as investigational or for research. This laboratory is certified under the [place] (CLIA-88) as  qualified to perform high complexity clinical laboratory testing.  The controls stained appropriately.   Glucose, capillary     Status:  Abnormal   Collection Time: 08/17/21  3:26 PM  Result Value Ref Range   Glucose-Capillary 117 (H) 70 - 99 mg/dL    Comment: Glucose reference range applies only to samples taken after fasting for at least 8 hours.  Lipid Profile     Status: Abnormal   Collection Time: 08/31/21  3:25 PM  Result Value Ref Range   Cholesterol, Total 191 100 - 199 mg/dL   Triglycerides 186 (H) 0 - 149 mg/dL   HDL 44 >39 mg/dL   VLDL Cholesterol Cal 33 5 - 40 mg/dL   LDL Chol Calc (NIH) 114 (H) 0 - 99 mg/dL   Chol/HDL Ratio 4.3 0.0 - 4.4 ratio    Comment:                                   T. Chol/HDL Ratio                                             Men  Women                               1/2 Avg.Risk  3.4    3.3                                   Avg.Risk  5.0    4.4                                2X Avg.Risk  9.6    7.1                                3X Avg.Risk 23.4   11.0   TSH     Status: Abnormal   Collection Time: 09/28/21  3:57 PM  Result Value Ref Range   TSH 16.900 (H) 0.450 - 4.500 uIU/mL  T4, free     Status: None   Collection Time: 09/28/21  3:57 PM  Result Value Ref Range   Free T4 1.19 0.82 - 1.77 ng/dL  Comprehensive metabolic panel     Status: Abnormal   Collection Time: 09/28/21  3:57 PM  Result Value Ref Range   Glucose 138 (H) 70 - 99 mg/dL   BUN 25 8 - 27 mg/dL   Creatinine, Ser 1.56 (H) 0.57 - 1.00 mg/dL   eGFR 34 (L) >59 mL/min/1.73   BUN/Creatinine Ratio 16 12 - 28   Sodium 141 134 - 144 mmol/L   Potassium 3.7 3.5 - 5.2 mmol/L   Chloride 104 96 - 106 mmol/L   CO2 19 (L) 20 - 29 mmol/L   Calcium 10.0 8.7 - 10.3 mg/dL   Total Protein 8.0 6.0 - 8.5 g/dL   Albumin 4.3 3.7 - 4.7 g/dL    Comment:                **Effective October 05, 2021 Albumin reference interval**                  will be changing to:  Age                   Female          Female                            0 -   7 days       3.6 - 4.9      3.6 - 4.9                            8 -  30 days       3.5 - 4.6      3.5 - 4.6                            1 -   6 months     3.7 - 4.8      3.7 - 4.8                     7 months -   2 years      4.0 - 5.0      4.0 - 5.0                            3 -   5 years      4.1 - 5.0      4.1 - 5.0                            6 -  12 years      4.2 - 5.0      4.2 - 5.0                           13 -  30 years      4.3 - 5.2      4.0 - 5.0                           31 -  50 years      4.1 - 5.1      3.9 - 4.9                           51 -  60 years      3.8 - 4.9      3.8 - 4.9                           61 -  70 years      3.9 - 4.9      3.9 - 4.9                           71 -  80 years      3.8 - 4.8      3.8 - 4.8                           8 1  -  89 years      3.7 - 4.7      3.7 - 4.7  90 - 199 years      3.6 - 4.6      3.6 - 4.6    Globulin, Total 3.7 1.5 - 4.5 g/dL   Albumin/Globulin Ratio 1.2 1.2 - 2.2   Bilirubin Total 0.3 0.0 - 1.2 mg/dL   Alkaline Phosphatase 106 44 - 121 IU/L   AST 24 0 - 40 IU/L   ALT 25 0 - 32 IU/L  D-dimer, quantitative     Status: Abnormal   Collection Time: 09/28/21  4:14 PM  Result Value Ref Range   D-Dimer, Quant 0.61 (H) 0.00 - 0.50 ug/mL-FEU    Comment: (NOTE) At the manufacturer cut-off value of 0.5 g/mL FEU, this assay has a negative predictive value of 95-100%.This assay is intended for use in conjunction with a clinical pretest probability (PTP) assessment model to exclude pulmonary embolism (PE) and deep venous thrombosis (DVT) in outpatients suspected of PE or DVT. Results should be correlated with clinical presentation. Performed at [facility], [address] [place], [place] [address]   HgB A1c     Status: None   Collection Time: 10/26/21  3:26 PM  Result Value Ref Range   Hemoglobin A1C     HbA1c POC (<> result, manual  entry)     HbA1c, POC (prediabetic range)     HbA1c, POC (controlled diabetic range) 7.0 0.0 - 7.0 %    Diabetic Labs (most recent): Lab Results  Component Value Date   HGBA1C 7.0 10/26/2021   HGBA1C 12.5 (H) 07/16/2021   HGBA1C 7.5 (H) 04/17/2021   MICROALBUR 0.4 05/07/2014   MICROALBUR 1.74 08/09/2013     Lipid Panel ( most recent) Lipid Panel     Component Value Date/Time   CHOL 191 08/31/2021 1525   TRIG 186 (H) 08/31/2021 1525   HDL 44 08/31/2021 1525   CHOLHDL 4.3 08/31/2021 1525   CHOLHDL 3.3 05/07/2014 1122   VLDL 24 05/07/2014 1122   LDLCALC 114 (H) 08/31/2021 1525          MRI of abdomen on May 04, 2017:  Normal right adrenal gland. Left adrenal nodule on the order of 1.5 cm is consistent with an adenoma.   Surveillance abdominal MRI from December 25, 2019 IMPRESSION: 1. Motion degraded exam shows multiple tiny cystic lesions in the pancreas. Dominant lesion has minimally increased from 14 mm to 17 mm in the 2.5 year interval since previous MRI. No gross soft tissue component or abnormal enhancement evident on today's markedly motion degraded exam. At 17 mm, consensus guidelines recommend repeat imaging every 6 months for 2 years to ensure stability. This recommendation follows ACR consensus guidelines: Management of Incidental Pancreatic Cysts: A White Paper of the ACR Incidental Findings Committee. [place] 1914;78:295-621. 2. Additional tiny 6 mm cystic foci in the body/tail region are new. Attention on follow-up recommended. 3. Abnormal fluid in the endometrial cavity of the uterus. Pelvic ultrasound for further evaluation. 4. Stable left adrenal adenoma. 5. Small hiatal hernia.   MRI of abdomen/pelvis on July 15, 2020 IMPRESSION: Motion degraded images.   16 mm unilocular pancreatic cyst, unchanged. This favors a benign pseudocyst or side branch IPMN and is of questionable clinical significance. Consider follow-up MR or CT  abdomen with/without contrast in 1 year, as clinically warranted. (MR is generally favored, but CT may be preferred given motion concerns).   Suspected endometrial soft tissue/thickening, incompletely visualized/evaluated. This continues to raise concern for early endometrial neoplasm. GYN consultation is suggested.   These  results will be called to the ordering clinician or representative by the Radiologist Assistant, and communication documented in the PACS or [employer].  Assessment & Plan:   1.  Type 2 diabetes-  Based on her presentation with controlled glycemic profile and point-of-care A1c of 7%, she will not need prandial insulin for now.  She is advised to continue Levemir 50 units nightly, Jardiance 10 mg p.o. daily at breakfast.  Side effects and precautions discussed with her.  She is encouraged to continue to wear her CGM at all times.    -She hesitates to engage with lifestyle medicine.  That would have given her a chance for controlling diabetes with less medications.  She is advised to call clinic for blood glucose readings less than 70 or greater than 200 mg per DL x3.     Side effects and precautions about Vania Rea was discussed and patients expresses that she was well informed about this medication by her PMD .   Patient stands to benefit from lifestyle medicine, however she states that whole food , plant-based diet will not work for her.    - she acknowledges that there is a room for improvement in her food and drink choices. - Suggestion is made for her to avoid simple carbohydrates  from her diet including Cakes, Sweet Desserts, Ice Cream, Soda (diet and regular), Sweet Tea, Candies, Chips, Cookies, Store Bought Juices, Alcohol , Artificial Sweeteners,  Coffee Creamer, and "Sugar-free" Products, Lemonade. This will help patient to have more stable blood glucose profile and potentially avoid unintended weight gain.  The following Lifestyle Medicine  recommendations according to [place]  [facility]) were discussed and and offered to patient and she  agrees to start the journey:  A. Whole Foods, Plant-Based Nutrition comprising of fruits and vegetables, plant-based proteins, whole-grain carbohydrates was discussed in detail with the patient.   A list for source of those nutrients were also provided to the patient.  Patient will use only water or unsweetened tea for hydration. B.  The need to stay away from risky substances including alcohol, smoking; obtaining 7 to 9 hours of restorative sleep, at least 150 minutes of moderate intensity exercise weekly, the importance of healthy social connections,  and stress management techniques were discussed. C.  A full color page of  Calorie density of various food groups per pound showing examples of each food groups was provided to the patient.    2.  Adenoma of left adrenal gland I reviewed her interval labs, imaging studies with her and her daughter in the exam room.    The left adrenal gland is reported to have mild thickening, right adrenal gland is normal.    -Her previsit plasma metanephrines are within normal limits.   -She has previously undergone 24-hour urine studies for catecholamines, metanephrines, cortisol, and aldosterone, which are all within normal limits.  -This is consistent with nonfunctioning left adrenal adenoma of 1.5 cm, stable on imaging studies.   -She will not require any specific treatment at this time. -Related to her pancreatic cysts/pseudocysts she is scheduled to have MRI every 6 months, will use those  imaging studies to follow-up on the adrenal adenoma.      3.  Hypothyroidism Her previsit thyroid function tests are consistent with under replacement, however patient admits to significant interruption in her levothyroxine.  She is advised to continue levothyroxine 125 mcg p.o. daily before breakfast.     - We discussed about the correct  intake of her thyroid hormone, on empty stomach at fasting, with water, separated by at least 30 minutes from breakfast and other medications,  and separated by more than 4 hours from calcium, iron, multivitamins, acid reflux medications (PPIs). -Patient is made aware of the fact that thyroid hormone replacement is needed for life, dose to be adjusted by periodic monitoring of thyroid function tests.  For Osteoporosis: she was previously treated with IV reclast but wishes to avoid it. She is asking for Prolia . Prolia is a good choice  for her , will prescribe Prolia 60 mg subcutaneously for her to bring to office for injection.     - I advised patient to maintain close follow up with Kathyrn Drown, MD for primary care needs.   I spent 36 minutes in the care of the patient today including review of labs from [place], Lipids, Thyroid Function, Hematology (current and previous including abstractions from other facilities); face-to-face time discussing  her blood glucose readings/logs, discussing hypoglycemia and hyperglycemia episodes and symptoms, medications doses, her options of short and long term treatment based on the latest standards of care / guidelines;  discussion about incorporating lifestyle medicine;  and documenting the encounter. Risk reduction counseling performed per USPSTF guidelines to reduce obesity and cardiovascular risk factors.     Please refer to Patient Instructions for Blood Glucose Monitoring and Insulin/Medications Dosing Guide"  in media tab for additional information. Please  also refer to " Patient Self Inventory" in the Media  tab for reviewed elements of pertinent patient history.  [name] participated in the discussions, expressed understanding, and voiced agreement with the above plans.  All questions were answered to her satisfaction. she is encouraged to contact clinic should she have any questions or concerns prior to her return visit.    Follow up  plan: Return in about 4 months (around 02/25/2022) for F/U with Pre-visit Labs, A1c -NV.   Glade Lloyd, MD [facility] [address] [place], [place] [address] Phone: [phone]  Fax: [phone]     10/26/2021, 6:34 PM  This note was partially dictated with voice recognition software. Similar sounding words can be transcribed inadequately or may not  be corrected upon review.

## 2021-10-28 ENCOUNTER — Ambulatory Visit: Payer: PPO | Admitting: Family Medicine

## 2021-10-29 ENCOUNTER — Other Ambulatory Visit: Payer: Self-pay | Admitting: "Endocrinology

## 2021-10-29 ENCOUNTER — Institutional Professional Consult (permissible substitution): Payer: HMO | Admitting: Internal Medicine

## 2021-10-29 ENCOUNTER — Other Ambulatory Visit: Payer: Self-pay | Admitting: Family Medicine

## 2021-11-11 ENCOUNTER — Other Ambulatory Visit: Payer: Self-pay | Admitting: Nurse Practitioner

## 2021-11-11 DIAGNOSIS — E1122 Type 2 diabetes mellitus with diabetic chronic kidney disease: Secondary | ICD-10-CM | POA: Diagnosis not present

## 2021-11-11 DIAGNOSIS — I129 Hypertensive chronic kidney disease with stage 1 through stage 4 chronic kidney disease, or unspecified chronic kidney disease: Secondary | ICD-10-CM

## 2021-11-12 ENCOUNTER — Telehealth: Payer: Self-pay | Admitting: Pharmacy Technician

## 2021-11-12 DIAGNOSIS — Z596 Low income: Secondary | ICD-10-CM

## 2021-11-12 NOTE — Progress Notes (Signed)
[facility])                                            [place] Team    11/12/2021  FRANCIES INCH January 23, 1946 [id number]                                      Medication Assistance Referral  Referral From: [place]   Medication/Company: Vania Rea / [employer] Patient application portion:  Mailed Provider application portion: Faxed  to Dr. Sallee Lange Provider address/fax verified via: Office website    Jami Ohlin P. Caulder Wehner, [place]  [phone]

## 2021-11-23 ENCOUNTER — Institutional Professional Consult (permissible substitution): Payer: HMO | Admitting: Internal Medicine

## 2021-11-23 NOTE — Progress Notes (Deleted)
Kristen Mack, female    DOB: 1945-07-04    MRN: [id number]   Brief patient profile:  ***  yowf  *** referred to pulmonary clinic in [place]  11/23/2021 by *** for ***    Spirometry wnl 2/ 7/19   History of Present Illness  11/23/2021  Pulmonary/ 1st office eval/ Melvyn Novas /  Office  No chief complaint on file.    Dyspnea:  *** Cough: *** Sleep: *** SABA use:   Past Medical History:  Diagnosis Date   ADHD (attention deficit hyperactivity disorder) 2007   Adrenal adenoma, left    followed by dr nida   Arthritis    Asthma    followed by pcp   CFS (chronic fatigue syndrome)    CKD (chronic kidney disease), stage III [facility])    nephrologist---  dr Joelyn Oms   Depression    Edema of both lower extremities    Fatty liver disease, nonalcoholic    Fibromyalgia    GERD (gastroesophageal reflux disease)    Hiatal hernia    History of adenomatous polyp of colon    History of cardiac murmur    until age 56- pt has had echocardiograms   History of TIA (transient ischemic attack)    per CT scan    Hyperlipidemia    Hypertension    Hypothyroidism    endocrinologist-- dr g. nida   IBS (irritable bowel syndrome)    Meniere disease    OSA on CPAP    per pt uses nightly   Osteopenia    Pancreas cyst    followed by pcp   PMB (postmenopausal bleeding)    PVC's (premature ventricular contractions)    cardiologist--- dr Audie Box   Type 2 diabetes mellitus treated with insulin [facility])    endocrinologist-- dr g. nida;    (08-14-2021  per pt checks blood sugar QID ,  fasting average --- 73-150)   Urinary incontinence    Wears glasses    Wears partial dentures    upper    Outpatient Medications Prior to Visit  Medication Sig Dispense Refill   albuterol (VENTOLIN HFA) 108 (90 Base) MCG/ACT inhaler INHALE 2 PUFFS INTO LUNGS EVERY 6 HOURS AS NEEDED FOR SHORTNESS OF BREATH (Patient taking differently: 2 puffs every 6 (six) hours as needed. INHALE 2 PUFFS INTO LUNGS EVERY 6  HOURS AS NEEDED FOR SHORTNESS OF BREATH) 27 g 5   Ascorbic Acid (VITAMIN C) 1000 MG tablet Take 6,000 mg by mouth daily.      Azelastine HCl 137 MCG/SPRAY SOLN USE 2 SPRAY(S) IN EACH NOSTRIL TWICE DAILY (Patient taking differently: Place 2 sprays into the nose 2 (two) times daily.) 30 mL 5   B Complex Vitamins (B COMPLEX 100 PO) Take 1 tablet by mouth daily.     blood glucose meter kit and supplies Dispense based on patient and insurance preference. Use to test sugars BID  (FOR ICD-10 E10.9, E11.9). 1 each 0   buPROPion (WELLBUTRIN XL) 300 MG 24 hr tablet Take 1 tablet (300 mg total) by mouth daily. (Patient taking differently: Take 300 mg by mouth at bedtime.) 90 tablet 1   Chromium Picolinate 1000 MCG TABS Take 1 tablet by mouth daily.     Coenzyme Q10 (COQ10) 200 MG CAPS Take 200 mg by mouth daily.     Continuous Blood Gluc Receiver (FREESTYLE LIBRE 2 READER) DEVI As directed 1 each 0   Continuous Blood Gluc Sensor (FREESTYLE LIBRE 2 SENSOR) MISC 1 Piece by Does  not apply route every 14 (fourteen) days. 2 each 3   Cyanocobalamin (B-12) 1000 MCG SUBL Place 1,000 mcg under the tongue daily.     DIGESTIVE ENZYMES PO Take 1 tablet by mouth 3 (three) times daily.      diltiazem (CARDIZEM CD) 120 MG 24 hr capsule Take 1 capsule by mouth once daily (Patient taking differently: 120 mg at bedtime.) 90 capsule 3   empagliflozin (JARDIANCE) 10 MG TABS tablet TAKE 1 TABLET BY MOUTH ONCE DAILY BEFORE BREAKFAST 30 tablet 2   gabapentin (NEURONTIN) 100 MG capsule Take 1 capsule (100 mg total) by mouth 2 (two) times daily. 60 capsule 3   glucose blood test strip Use to check blood glucose four times daily as instructed 100 each 2   ipratropium (ATROVENT) 0.02 % nebulizer solution USE ONE VIAL IN NEBULIZER 4 TIMES DAILY AS NEEDED FOR SHORTNESS OF BREATH (Patient taking differently: every 4 (four) hours as needed. USE ONE VIAL IN NEBULIZER 4 TIMES DAILY AS NEEDED FOR SHORTNESS OF BREATH) 1 mL 6   LEVEMIR FLEXPEN  100 UNIT/ML FlexPen INJECT 50 UNITS SUBCUTANEOUSLY EVERYDAY AT BEDTIME 45 mL 0   levothyroxine (SYNTHROID) 125 MCG tablet TAKE 1 TABLET BY MOUTH ONCE DAILY BEFORE BREAKFAST 90 tablet 0   Lysine HCl 500 MG TABS Take 500 mg by mouth daily.     Magnesium 250 MG TABS Take 250 mg by mouth 2 (two) times daily.     meclizine (ANTIVERT) 25 MG tablet Take 25 mg by mouth 3 (three) times daily as needed for dizziness.     montelukast (SINGULAIR) 10 MG tablet TAKE 1 TABLET BY MOUTH AT BEDTIME 90 tablet 0   OVER THE COUNTER MEDICATION Take 1 Scoop by mouth 3 (three) times daily with meals. Fennel seeds     OVER THE COUNTER MEDICATION Take 1 tablet by mouth at bedtime as needed (sleep). Sleep essentials     rosuvastatin (CRESTOR) 5 MG tablet TAKE 1 TABLET BY MOUTH ON MONDAY, WEDNESDAY,  FRIDAY AND SATURDAY 18 tablet 5   TART CHERRY PO Take 1 tablet by mouth in the morning, at noon, and at bedtime.     tiZANidine (ZANAFLEX) 2 MG tablet 1 twice daily  prn caution drowsiness 60 tablet 1   traMADol (ULTRAM) 50 MG tablet Take 1 tablet by mouth twice daily as needed for pain 10 tablet 1   Vitamin A 3 MG (10000 UT) TABS Take 10,000 Units by mouth daily.     vitamin E 180 MG (400 UNITS) capsule Take 800 Units by mouth daily.      Facility-Administered Medications Prior to Visit  Medication Dose Route Frequency Provider Last Rate Last Admin   gentamicin (GARAMYCIN) 330 mg in dextrose 5 % 100 mL IVPB  330 mg Intravenous Q24H Karren Cobble, RPH   330 mg at 08/17/21 1442     Objective:     There were no vitals taken for this visit.         Assessment   No problem-specific Assessment & Plan notes found for this encounter.     Christinia Gully, MD 11/23/2021

## 2021-12-08 DIAGNOSIS — N1832 Chronic kidney disease, stage 3b: Secondary | ICD-10-CM | POA: Diagnosis not present

## 2021-12-11 ENCOUNTER — Telehealth: Payer: Self-pay

## 2021-12-11 ENCOUNTER — Other Ambulatory Visit: Payer: Self-pay

## 2021-12-11 ENCOUNTER — Emergency Department (HOSPITAL_COMMUNITY): Payer: HMO

## 2021-12-11 ENCOUNTER — Inpatient Hospital Stay (HOSPITAL_COMMUNITY)
Admission: EM | Admit: 2021-12-11 | Discharge: 2021-12-12 | DRG: 309 | Disposition: A | Discharge status: Home / Self Care | Payer: HMO | Attending: Family Medicine | Admitting: Family Medicine

## 2021-12-11 ENCOUNTER — Encounter (HOSPITAL_COMMUNITY): Payer: Self-pay | Admitting: Emergency Medicine

## 2021-12-11 DIAGNOSIS — Z6841 Body Mass Index (BMI) 40.0 and over, adult: Secondary | ICD-10-CM | POA: Diagnosis not present

## 2021-12-11 DIAGNOSIS — Z91040 Latex allergy status: Secondary | ICD-10-CM

## 2021-12-11 DIAGNOSIS — R0600 Dyspnea, unspecified: Secondary | ICD-10-CM | POA: Diagnosis not present

## 2021-12-11 DIAGNOSIS — I160 Hypertensive urgency: Secondary | ICD-10-CM | POA: Diagnosis not present

## 2021-12-11 DIAGNOSIS — Z803 Family history of malignant neoplasm of breast: Secondary | ICD-10-CM

## 2021-12-11 DIAGNOSIS — E782 Mixed hyperlipidemia: Secondary | ICD-10-CM | POA: Diagnosis present

## 2021-12-11 DIAGNOSIS — M797 Fibromyalgia: Secondary | ICD-10-CM | POA: Diagnosis present

## 2021-12-11 DIAGNOSIS — E039 Hypothyroidism, unspecified: Secondary | ICD-10-CM | POA: Diagnosis not present

## 2021-12-11 DIAGNOSIS — N183 Type 2 diabetes mellitus with diabetic chronic kidney disease: Secondary | ICD-10-CM | POA: Diagnosis present

## 2021-12-11 DIAGNOSIS — G9332 Myalgic encephalomyelitis/chronic fatigue syndrome: Secondary | ICD-10-CM | POA: Diagnosis present

## 2021-12-11 DIAGNOSIS — G4733 Obstructive sleep apnea (adult) (pediatric): Secondary | ICD-10-CM | POA: Diagnosis present

## 2021-12-11 DIAGNOSIS — Z8673 Personal history of transient ischemic attack (TIA), and cerebral infarction without residual deficits: Secondary | ICD-10-CM | POA: Diagnosis not present

## 2021-12-11 DIAGNOSIS — I5032 Chronic diastolic (congestive) heart failure: Secondary | ICD-10-CM | POA: Diagnosis not present

## 2021-12-11 DIAGNOSIS — I130 Hypertensive heart and chronic kidney disease with heart failure and stage 1 through stage 4 chronic kidney disease, or unspecified chronic kidney disease: Secondary | ICD-10-CM | POA: Diagnosis present

## 2021-12-11 DIAGNOSIS — Z818 Family history of other mental and behavioral disorders: Secondary | ICD-10-CM

## 2021-12-11 DIAGNOSIS — Z79899 Other long term (current) drug therapy: Secondary | ICD-10-CM

## 2021-12-11 DIAGNOSIS — Z79890 Hormone replacement therapy (postmenopausal): Secondary | ICD-10-CM

## 2021-12-11 DIAGNOSIS — Z888 Allergy status to other drugs, medicaments and biological substances status: Secondary | ICD-10-CM

## 2021-12-11 DIAGNOSIS — E876 Hypokalemia: Secondary | ICD-10-CM | POA: Diagnosis not present

## 2021-12-11 DIAGNOSIS — I10 Essential (primary) hypertension: Secondary | ICD-10-CM | POA: Diagnosis not present

## 2021-12-11 DIAGNOSIS — Z794 Long term (current) use of insulin: Secondary | ICD-10-CM | POA: Diagnosis not present

## 2021-12-11 DIAGNOSIS — Z885 Allergy status to narcotic agent status: Secondary | ICD-10-CM

## 2021-12-11 DIAGNOSIS — K7581 Nonalcoholic steatohepatitis (NASH): Secondary | ICD-10-CM | POA: Diagnosis not present

## 2021-12-11 DIAGNOSIS — Z20822 Contact with and (suspected) exposure to covid-19: Secondary | ICD-10-CM | POA: Diagnosis not present

## 2021-12-11 DIAGNOSIS — M79604 Pain in right leg: Secondary | ICD-10-CM | POA: Diagnosis not present

## 2021-12-11 DIAGNOSIS — H8109 Meniere's disease, unspecified ear: Secondary | ICD-10-CM | POA: Diagnosis present

## 2021-12-11 DIAGNOSIS — N1832 Chronic kidney disease, stage 3b: Secondary | ICD-10-CM | POA: Diagnosis present

## 2021-12-11 DIAGNOSIS — M1990 Unspecified osteoarthritis, unspecified site: Secondary | ICD-10-CM | POA: Diagnosis present

## 2021-12-11 DIAGNOSIS — F32A Depression, unspecified: Secondary | ICD-10-CM | POA: Diagnosis present

## 2021-12-11 DIAGNOSIS — Z8041 Family history of malignant neoplasm of ovary: Secondary | ICD-10-CM

## 2021-12-11 DIAGNOSIS — Z8349 Family history of other endocrine, nutritional and metabolic diseases: Secondary | ICD-10-CM

## 2021-12-11 DIAGNOSIS — E1122 Type 2 diabetes mellitus with diabetic chronic kidney disease: Secondary | ICD-10-CM | POA: Diagnosis present

## 2021-12-11 DIAGNOSIS — K219 Gastro-esophageal reflux disease without esophagitis: Secondary | ICD-10-CM | POA: Diagnosis not present

## 2021-12-11 DIAGNOSIS — R001 Bradycardia, unspecified: Principal | ICD-10-CM | POA: Diagnosis present

## 2021-12-11 DIAGNOSIS — M8580 Other specified disorders of bone density and structure, unspecified site: Secondary | ICD-10-CM | POA: Diagnosis present

## 2021-12-11 DIAGNOSIS — R9431 Abnormal electrocardiogram [ECG] [EKG]: Secondary | ICD-10-CM | POA: Diagnosis not present

## 2021-12-11 DIAGNOSIS — Z7984 Long term (current) use of oral hypoglycemic drugs: Secondary | ICD-10-CM

## 2021-12-11 DIAGNOSIS — K580 Irritable bowel syndrome with diarrhea: Secondary | ICD-10-CM | POA: Diagnosis not present

## 2021-12-11 DIAGNOSIS — I5031 Acute diastolic (congestive) heart failure: Secondary | ICD-10-CM | POA: Diagnosis not present

## 2021-12-11 DIAGNOSIS — Z87891 Personal history of nicotine dependence: Secondary | ICD-10-CM

## 2021-12-11 DIAGNOSIS — Z8249 Family history of ischemic heart disease and other diseases of the circulatory system: Secondary | ICD-10-CM

## 2021-12-11 DIAGNOSIS — Z813 Family history of other psychoactive substance abuse and dependence: Secondary | ICD-10-CM

## 2021-12-11 DIAGNOSIS — Z800 Family history of malignant neoplasm of digestive organs: Secondary | ICD-10-CM

## 2021-12-11 DIAGNOSIS — Z823 Family history of stroke: Secondary | ICD-10-CM

## 2021-12-11 DIAGNOSIS — J45909 Unspecified asthma, uncomplicated: Secondary | ICD-10-CM | POA: Diagnosis not present

## 2021-12-11 DIAGNOSIS — G4730 Sleep apnea, unspecified: Secondary | ICD-10-CM | POA: Diagnosis present

## 2021-12-11 DIAGNOSIS — Z833 Family history of diabetes mellitus: Secondary | ICD-10-CM

## 2021-12-11 DIAGNOSIS — M7989 Other specified soft tissue disorders: Secondary | ICD-10-CM | POA: Diagnosis not present

## 2021-12-11 DIAGNOSIS — K589 Irritable bowel syndrome without diarrhea: Secondary | ICD-10-CM | POA: Diagnosis present

## 2021-12-11 DIAGNOSIS — Z882 Allergy status to sulfonamides status: Secondary | ICD-10-CM

## 2021-12-11 DIAGNOSIS — R0609 Other forms of dyspnea: Secondary | ICD-10-CM | POA: Diagnosis not present

## 2021-12-11 LAB — CBC WITH DIFFERENTIAL/PLATELET
Abs Immature Granulocytes: 0.03 10*3/uL (ref 0.00–0.07)
Basophils Absolute: 0.1 10*3/uL (ref 0.0–0.1)
Basophils Relative: 1 %
Eosinophils Absolute: 0.2 10*3/uL (ref 0.0–0.5)
Eosinophils Relative: 2 %
HCT: 37.5 % (ref 36.0–46.0)
Hemoglobin: 12.2 g/dL (ref 12.0–15.0)
Immature Granulocytes: 0 %
Lymphocytes Relative: 16 %
Lymphs Abs: 1.6 10*3/uL (ref 0.7–4.0)
MCH: 27.1 pg (ref 26.0–34.0)
MCHC: 32.5 g/dL (ref 30.0–36.0)
MCV: 83.3 fL (ref 80.0–100.0)
Monocytes Absolute: 0.7 10*3/uL (ref 0.1–1.0)
Monocytes Relative: 7 %
Neutro Abs: 7 10*3/uL (ref 1.7–7.7)
Neutrophils Relative %: 74 %
Platelets: 393 10*3/uL (ref 150–400)
RBC: 4.5 MIL/uL (ref 3.87–5.11)
RDW: 14.3 % (ref 11.5–15.5)
WBC: 9.5 10*3/uL (ref 4.0–10.5)
nRBC: 0 % (ref 0.0–0.2)

## 2021-12-11 LAB — URINALYSIS, ROUTINE W REFLEX MICROSCOPIC
Bilirubin Urine: NEGATIVE
Glucose, UA: NEGATIVE mg/dL
Hgb urine dipstick: NEGATIVE
Ketones, ur: NEGATIVE mg/dL
Nitrite: NEGATIVE
Protein, ur: NEGATIVE mg/dL
Specific Gravity, Urine: 1.003 — ABNORMAL LOW (ref 1.005–1.030)
pH: 6 (ref 5.0–8.0)

## 2021-12-11 LAB — CBC
HCT: 39.4 % (ref 36.0–46.0)
Hemoglobin: 12.9 g/dL (ref 12.0–15.0)
MCH: 27.2 pg (ref 26.0–34.0)
MCHC: 32.7 g/dL (ref 30.0–36.0)
MCV: 83.1 fL (ref 80.0–100.0)
Platelets: 399 10*3/uL (ref 150–400)
RBC: 4.74 MIL/uL (ref 3.87–5.11)
RDW: 14.5 % (ref 11.5–15.5)
WBC: 12 10*3/uL — ABNORMAL HIGH (ref 4.0–10.5)
nRBC: 0 % (ref 0.0–0.2)

## 2021-12-11 LAB — COMPREHENSIVE METABOLIC PANEL
ALT: 25 U/L (ref 0–44)
AST: 24 U/L (ref 15–41)
Albumin: 3.2 g/dL — ABNORMAL LOW (ref 3.5–5.0)
Alkaline Phosphatase: 73 U/L (ref 38–126)
Anion gap: 7 (ref 5–15)
BUN: 23 mg/dL (ref 8–23)
CO2: 24 mmol/L (ref 22–32)
Calcium: 9 mg/dL (ref 8.9–10.3)
Chloride: 109 mmol/L (ref 98–111)
Creatinine, Ser: 1.41 mg/dL — ABNORMAL HIGH (ref 0.44–1.00)
GFR, Estimated: 38 mL/min — ABNORMAL LOW (ref >60)
Glucose, Bld: 152 mg/dL — ABNORMAL HIGH (ref 70–99)
Potassium: 2.7 mmol/L — CL (ref 3.5–5.1)
Sodium: 140 mmol/L (ref 135–145)
Total Bilirubin: 0.7 mg/dL (ref 0.3–1.2)
Total Protein: 7.4 g/dL (ref 6.5–8.1)

## 2021-12-11 LAB — BASIC METABOLIC PANEL
Anion gap: 7 (ref 5–15)
BUN: 19 mg/dL (ref 8–23)
CO2: 22 mmol/L (ref 22–32)
Calcium: 8.8 mg/dL — ABNORMAL LOW (ref 8.9–10.3)
Chloride: 112 mmol/L — ABNORMAL HIGH (ref 98–111)
Creatinine, Ser: 1.35 mg/dL — ABNORMAL HIGH (ref 0.44–1.00)
GFR, Estimated: 40 mL/min — ABNORMAL LOW (ref >60)
Glucose, Bld: 132 mg/dL — ABNORMAL HIGH (ref 70–99)
Potassium: 3.1 mmol/L — ABNORMAL LOW (ref 3.5–5.1)
Sodium: 141 mmol/L (ref 135–145)

## 2021-12-11 LAB — CBG MONITORING, ED
Glucose-Capillary: 177 mg/dL — ABNORMAL HIGH (ref 70–99)
Glucose-Capillary: 129 mg/dL — ABNORMAL HIGH (ref 70–99)

## 2021-12-11 LAB — TROPONIN I (HIGH SENSITIVITY)
Troponin I (High Sensitivity): 17 ng/L (ref <18)
Troponin I (High Sensitivity): 18 ng/L — ABNORMAL HIGH (ref <18)

## 2021-12-11 LAB — HEMOGLOBIN A1C
Hgb A1c MFr Bld: 7.3 % — ABNORMAL HIGH (ref 4.8–5.6)
Mean Plasma Glucose: 162.81 mg/dL

## 2021-12-11 LAB — MAGNESIUM
Magnesium: 1.9 mg/dL (ref 1.7–2.4)
Magnesium: 2.4 mg/dL (ref 1.7–2.4)

## 2021-12-11 LAB — TSH: TSH: 7.41 u[IU]/mL — ABNORMAL HIGH (ref 0.350–4.500)

## 2021-12-11 LAB — GLUCOSE, CAPILLARY: Glucose-Capillary: 123 mg/dL — ABNORMAL HIGH (ref 70–99)

## 2021-12-11 LAB — SARS CORONAVIRUS 2 BY RT PCR: SARS Coronavirus 2 by RT PCR: NEGATIVE

## 2021-12-11 LAB — BRAIN NATRIURETIC PEPTIDE: B Natriuretic Peptide: 380 pg/mL — ABNORMAL HIGH (ref 0.0–100.0)

## 2021-12-11 MED ORDER — INSULIN ASPART 100 UNIT/ML IJ SOLN: 0.0000 [IU] | Freq: Every day | INTRAMUSCULAR | Status: DC

## 2021-12-11 MED ORDER — MONTELUKAST SODIUM 10 MG PO TABS
10.0000 mg | ORAL_TABLET | Freq: Every day | ORAL | Status: DC
  Administered 2021-12-11: 10 mg via ORAL
  Filled 2021-12-11: qty 1

## 2021-12-11 MED ORDER — GABAPENTIN 100 MG PO CAPS
100.0000 mg | ORAL_CAPSULE | Freq: Every day | ORAL | Status: DC | PRN
  Administered 2021-12-11: 100 mg via ORAL
  Filled 2021-12-11: qty 1

## 2021-12-11 MED ORDER — POTASSIUM CHLORIDE CRYS ER 20 MEQ PO TBCR: 20.0000 meq | EXTENDED_RELEASE_TABLET | Freq: Two times a day (BID) | ORAL | 0 refills | Status: DC

## 2021-12-11 MED ORDER — POTASSIUM CHLORIDE CRYS ER 20 MEQ PO TBCR
40.0000 meq | EXTENDED_RELEASE_TABLET | Freq: Once | ORAL | Status: AC
  Administered 2021-12-11: 40 meq via ORAL

## 2021-12-11 MED ORDER — B-12 1000 MCG SL SUBL: 1000.0000 ug | SUBLINGUAL_TABLET | Freq: Every day | SUBLINGUAL | Status: DC

## 2021-12-11 MED ORDER — AZELASTINE HCL 0.1 % NA SOLN
2.0000 | Freq: Two times a day (BID) | NASAL | Status: DC
  Administered 2021-12-12: 2 via NASAL
  Filled 2021-12-11: qty 30

## 2021-12-11 MED ORDER — ORAL CARE MOUTH RINSE: 15.0000 mL | OROMUCOSAL | Status: DC | PRN

## 2021-12-11 MED ORDER — POTASSIUM CHLORIDE 10 MEQ/100ML IV SOLN
10.0000 meq | INTRAVENOUS | Status: AC
  Administered 2021-12-11 (×4): 10 meq via INTRAVENOUS
  Filled 2021-12-11 (×3): qty 100

## 2021-12-11 MED ORDER — HYDRALAZINE HCL 20 MG/ML IJ SOLN
5.0000 mg | INTRAMUSCULAR | Status: DC | PRN
  Administered 2021-12-11 (×2): 5 mg via INTRAVENOUS
  Filled 2021-12-11 (×3): qty 1

## 2021-12-11 MED ORDER — POTASSIUM CHLORIDE 10 MEQ/100ML IV SOLN
10.0000 meq | INTRAVENOUS | Status: AC
  Administered 2021-12-11 (×2): 10 meq via INTRAVENOUS
  Filled 2021-12-11 (×2): qty 100

## 2021-12-11 MED ORDER — INSULIN ASPART 100 UNIT/ML IJ SOLN
0.0000 [IU] | Freq: Three times a day (TID) | INTRAMUSCULAR | Status: DC
  Administered 2021-12-11 – 2021-12-12 (×2): 2 [IU] via SUBCUTANEOUS
  Administered 2021-12-12: 3 [IU] via SUBCUTANEOUS
  Filled 2021-12-11: qty 1

## 2021-12-11 MED ORDER — ACETAMINOPHEN 325 MG PO TABS: 650.0000 mg | ORAL_TABLET | Freq: Four times a day (QID) | ORAL | Status: DC | PRN

## 2021-12-11 MED ORDER — HEPARIN SODIUM (PORCINE) 5000 UNIT/ML IJ SOLN
5000.0000 [IU] | Freq: Three times a day (TID) | INTRAMUSCULAR | Status: DC
  Filled 2021-12-11 (×2): qty 1

## 2021-12-11 MED ORDER — INSULIN DETEMIR 100 UNIT/ML ~~LOC~~ SOLN
40.0000 [IU] | Freq: Every day | SUBCUTANEOUS | Status: DC
  Administered 2021-12-11: 40 [IU] via SUBCUTANEOUS
  Filled 2021-12-11 (×2): qty 0.4

## 2021-12-11 MED ORDER — POTASSIUM CHLORIDE CRYS ER 20 MEQ PO TBCR
40.0000 meq | EXTENDED_RELEASE_TABLET | Freq: Once | ORAL | Status: AC
  Administered 2021-12-11: 40 meq via ORAL
  Filled 2021-12-11: qty 2

## 2021-12-11 MED ORDER — LEVOTHYROXINE SODIUM 125 MCG PO TABS
125.0000 ug | ORAL_TABLET | Freq: Every day | ORAL | Status: DC
  Administered 2021-12-12: 125 ug via ORAL
  Filled 2021-12-11: qty 1

## 2021-12-11 MED ORDER — ACETAMINOPHEN 650 MG RE SUPP: 650.0000 mg | Freq: Four times a day (QID) | RECTAL | Status: DC | PRN

## 2021-12-11 MED ORDER — IPRATROPIUM BROMIDE 0.02 % IN SOLN: 0.2500 mg | RESPIRATORY_TRACT | Status: DC | PRN

## 2021-12-11 MED ORDER — ROSUVASTATIN CALCIUM 5 MG PO TABS
5.0000 mg | ORAL_TABLET | Freq: Every day | ORAL | Status: DC
  Administered 2021-12-12: 5 mg via ORAL
  Filled 2021-12-11: qty 1

## 2021-12-11 MED ORDER — POTASSIUM CHLORIDE 10 MEQ/100ML IV SOLN
10.0000 meq | Freq: Once | INTRAVENOUS | Status: AC
  Administered 2021-12-11: 10 meq via INTRAVENOUS
  Filled 2021-12-11: qty 100

## 2021-12-11 MED ORDER — POTASSIUM CHLORIDE CRYS ER 20 MEQ PO TBCR
30.0000 meq | EXTENDED_RELEASE_TABLET | ORAL | Status: AC
  Administered 2021-12-11 – 2021-12-12 (×2): 30 meq via ORAL
  Filled 2021-12-11 (×2): qty 1

## 2021-12-11 MED ORDER — MAGNESIUM SULFATE 2 GM/50ML IV SOLN
2.0000 g | Freq: Once | INTRAVENOUS | Status: AC
  Administered 2021-12-11: 2 g via INTRAVENOUS
  Filled 2021-12-11: qty 50

## 2021-12-11 NOTE — Telephone Encounter (Signed)
100% that was the right way to go

## 2021-12-11 NOTE — Telephone Encounter (Signed)
Patient reported heart rate 33f34 and feeling like she is going to fall over, she reported heavy breathing and her head is swimming when she stands up , lips are purple and has a hx of cardiac problems. Diarrhea x 3 this morning as well as nausea. Patient and daughter have decided to call 911 instead of waiting for the 4 pm appt

## 2021-12-11 NOTE — ED Notes (Signed)
MD made aware of difference between pulse rate of 35 and HR of 69

## 2021-12-11 NOTE — ED Triage Notes (Signed)
Pt arrived via [facility] from home c/o low SpO2 at home in the 30s and "some SOB". Denied chest pain.  Per EMS, frequent PVCs on cardiac monitor. Per EMS, oxygen was 95% on RA. BP elevated 200/78. Hx of asthma

## 2021-12-11 NOTE — Consult Note (Signed)
Cardiology Consultation   Patient ID: Kristen Mack MRN: [id number]; DOB: 12-Sep-1945  Admit date: 12/11/2021 Date of Consult: 12/11/2021  PCP:  Kristen Drown, MD   [facility] Cardiologist:  Kristen Field, MD        Patient Profile:   Kristen Mack is a 76 y.o. female with a hx of palpitations (22% PVC burden by monitor in 01/2020), Hypothyroidism, HTN, Type 2 DM, OSA, Stage 3 CKD, and prior CVA who is being seen 12/11/2021 for the evaluation of bradycardia at the request of Kristen Mack.  History of Present Illness:   Kristen Mack was examined by Kristen Mack in 03/2021 and reported that her palpitations had significantly improved and she was only experiencing episodes of her heart racing approximately once per week. It was felt that her elevated PVC burden was likely due to hyperthyroidism in 2021 but she had most recently been experiencing episodes of hypothyroidism and her medication was being adjusted by her PCP. She was continued on Cardizem CD 120 mg daily.  She presented to [facility] ED earlier today for evaluation of worsening dyspnea and episodes of bradycardia at home with heart rate in the 30's. Also reported elevated BP as high as 200/78. Reported associated dizziness with positional changes but no syncopal events.   Patient denies dyspnea on exam today.  She notes that she had  new nausea and diarrhea.  She has had multiple loose BMS.  She is concerned this is related to her SGLT2i and she had stopped this medication   She notes in the past she had had her diuretic stopped as well.  Since she came in her nausea has resolved.  She notes that when someone takes her BP with an automated cuff it is always high.  She notes that she finds the automated cuff painful.  She is pending a manual BP check  Her daughters note that she has had worsening leg swelling R > L that has not improved despite cutting back on salt.  Initial Mack show WBC  9.5, Hgb 12.2, platelets 393, and creatinine 1.41 (close to baseline of ~ 1.5). AST 24 and ALT 25. Mg 1.9. BNP 380. Initial Hs Troponin negative at 17 with repeat pending.  CXR with no active cardiopulmonary disease.  Ultrasound of right lower extremity shows no evidence of DVT. EKG shows NSR, HR 64 with isolated PVC but no acute ST changes.   She has been on telemetry since 11:27 AM.  I have not seen objective evidence of bradycardia: SR with frequent PVCs.   Past Medical History:  Diagnosis Date   ADHD (attention deficit hyperactivity disorder) 2007   Adrenal adenoma, left    followed by Kristen Mack   Arthritis    Asthma    followed by pcp   CFS (chronic fatigue syndrome)    CKD (chronic kidney disease), stage III [facility])    nephrologist---  Kristen Mack   Depression    Edema of both lower extremities    Fatty liver disease, nonalcoholic    Fibromyalgia    GERD (gastroesophageal reflux disease)    Hiatal hernia    History of adenomatous polyp of colon    History of cardiac murmur    until age 18- pt has had echocardiograms   History of TIA (transient ischemic attack)    per CT scan    Hyperlipidemia    Hypertension    Hypothyroidism    endocrinologist-- Kristen g. Mack  IBS (irritable bowel syndrome)    Meniere disease    OSA on CPAP    per pt uses nightly   Osteopenia    Pancreas cyst    followed by pcp   PMB (postmenopausal bleeding)    PVC's (premature ventricular contractions)    cardiologist--- Kristen Kristen Mack   Type 2 diabetes mellitus treated with insulin [facility])    endocrinologist-- Kristen g. Mack;    (08-14-2021  per pt checks blood sugar QID ,  fasting average --- 73-150)   Urinary incontinence    Wears glasses    Wears partial dentures    upper    Past Surgical History:  Procedure Laterality Date   CERVICAL POLYPECTOMY N/A 08/17/2021   Procedure: BIOPSY OF ENDOMETRIUM AND OR POLYPECTOMY;  Surgeon: Kristen Kava, MD;  Location: [place];  Service: Gynecology;   Laterality: N/A;   [place]   and  REPAIR UMBILICAL HERNIA   COLONOSCOPY N/A 04/19/2013   Procedure: COLONOSCOPY;  Surgeon: Kristen Houston, MD;  [facility];  Service: Endoscopy;  Laterality: N/A;     DILITATION & CURRETTAGE/HYSTROSCOPY WITH NOVASURE ABLATION N/A 08/17/2021   Procedure: DILATATION & CURETTAGE/HYSTEROSCOPY;  Surgeon: Kristen Kava, MD;  Location: [place];  Service: Gynecology;  Laterality: N/A;   ESOPHAGOGASTRODUODENOSCOPY  02/08/2003   HERNIA REPAIR  1995   HYSTEROSCOPY WITH D & C  11/14/2002   endometrial polypectomy   HYSTEROSCOPY WITH D & C N/A 04/05/2014   Procedure: DILATATION AND CURETTAGE /HYSTEROSCOPY, ENDOMETRIAL POLYPECTOMY;  Surgeon: Kristen Kava, MD;  Location: [place];  Service: Gynecology;  Laterality: N/A;   LIVER BIOPSY  01/29/2004   benign   TUBAL LIGATION  1979     Home Medications:  Prior to Admission medications   Medication Sig Start Date End Date Taking? Authorizing Provider  albuterol (VENTOLIN HFA) 108 (90 Base) MCG/ACT inhaler INHALE 2 PUFFS INTO LUNGS EVERY 6 HOURS AS NEEDED FOR SHORTNESS OF BREATH Patient taking differently: 2 puffs every 6 (six) hours as needed. INHALE 2 PUFFS INTO LUNGS EVERY 6 HOURS AS NEEDED FOR SHORTNESS OF BREATH 10/07/20   Kristen Drown, MD  Ascorbic Acid (VITAMIN C) 1000 MG tablet Take 6,000 mg by mouth daily.     [provider]  Azelastine HCl 137 MCG/SPRAY SOLN USE 2 SPRAY(S) IN EACH NOSTRIL TWICE DAILY Patient taking differently: Place 2 sprays into the nose 2 (two) times daily. 08/27/20   Kristen Drown, MD  B Complex Vitamins (B COMPLEX 100 PO) Take 1 tablet by mouth daily.    [provider]  blood glucose meter kit and supplies Dispense based on patient and insurance preference. Use to test sugars BID  (FOR ICD-10 E10.9, E11.9). 07/17/21   Kristen Drown, MD  buPROPion (WELLBUTRIN XL) 300 MG 24 hr  tablet Take 1 tablet (300 mg total) by mouth daily. Patient taking differently: Take 300 mg by mouth at bedtime. 10/07/20   Kristen Drown, MD  Chromium Picolinate 1000 MCG TABS Take 1 tablet by mouth daily.    [provider]  Coenzyme Q10 (COQ10) 200 MG CAPS Take 200 mg by mouth daily.    [provider]  Continuous Blood Gluc Receiver (FREESTYLE LIBRE 2 READER) DEVI As directed 08/10/21   Cassandria Anger, MD  Continuous Blood Gluc Sensor (FREESTYLE LIBRE 2 SENSOR) MISC 1 Piece by Does not apply route every  14 (fourteen) days. 08/10/21   Cassandria Anger, MD  Cyanocobalamin (B-12) 1000 MCG SUBL Place 1,000 mcg under the tongue daily.    [provider]  DIGESTIVE ENZYMES PO Take 1 tablet by mouth 3 (three) times daily.     [provider]  diltiazem (CARDIZEM CD) 120 MG 24 hr capsule Take 1 capsule by mouth once daily Patient taking differently: 120 mg at bedtime. 03/31/21   Geralynn Rile, MD  empagliflozin (JARDIANCE) 10 MG TABS tablet TAKE 1 TABLET BY MOUTH ONCE DAILY BEFORE BREAKFAST 09/08/21   Kristen Drown, MD  gabapentin (NEURONTIN) 100 MG capsule Take 1 capsule (100 mg total) by mouth 2 (two) times daily. 10/02/21   Kristen Drown, MD  glucose blood test strip Use to check blood glucose four times daily as instructed 09/03/21   Cassandria Anger, MD  ipratropium (ATROVENT) 0.02 % nebulizer solution USE ONE VIAL IN NEBULIZER 4 TIMES DAILY AS NEEDED FOR SHORTNESS OF BREATH Patient taking differently: every 4 (four) hours as needed. USE ONE VIAL IN NEBULIZER 4 TIMES DAILY AS NEEDED FOR SHORTNESS OF BREATH 02/18/18   Kristen Drown, MD  LEVEMIR FLEXPEN 100 UNIT/ML FlexPen INJECT 50 UNITS SUBCUTANEOUSLY EVERYDAY AT BEDTIME 11/12/21   Cassandria Anger, MD  levothyroxine (SYNTHROID) 125 MCG tablet TAKE 1 TABLET BY MOUTH ONCE DAILY BEFORE BREAKFAST 10/30/21   Mack, Marella Chimes, MD  Lysine HCl 500 MG TABS Take 500 mg by mouth daily.     [provider]  Magnesium 250 MG TABS Take 250 mg by mouth 2 (two) times daily.    [provider]  meclizine (ANTIVERT) 25 MG tablet Take 25 mg by mouth 3 (three) times daily as needed for dizziness.    [provider]  montelukast (SINGULAIR) 10 MG tablet TAKE 1 TABLET BY MOUTH AT BEDTIME 10/30/21   Luking, Elayne Snare, MD  OVER THE COUNTER MEDICATION Take 1 Scoop by mouth 3 (three) times daily with meals. Fennel seeds    [provider]  OVER THE COUNTER MEDICATION Take 1 tablet by mouth at bedtime as needed (sleep). Sleep essentials    [provider]  rosuvastatin (CRESTOR) 5 MG tablet TAKE 1 TABLET BY MOUTH ON MONDAY, WEDNESDAY,  FRIDAY AND SATURDAY 09/03/21   Kristen Drown, MD  TART CHERRY PO Take 1 tablet by mouth in the morning, at noon, and at bedtime.    [provider]  tiZANidine (ZANAFLEX) 2 MG tablet 1 twice daily  prn caution drowsiness 10/07/21   Kristen Drown, MD  traMADol (ULTRAM) 50 MG tablet Take 1 tablet by mouth twice daily as needed for pain 10/05/21   Kristen Drown, MD  Vitamin A 3 MG (10000 UT) TABS Take 10,000 Units by mouth daily.    [provider]  vitamin E 180 MG (400 UNITS) capsule Take 800 Units by mouth daily.     [provider]    Inpatient Medications: Scheduled Meds:  Continuous Infusions:  magnesium sulfate bolus IVPB 2 g (12/11/21 1623)   potassium chloride 10 mEq (12/11/21 1622)   PRN Meds:   Allergies:    Allergies  Allergen Reactions   Ambien [Zolpidem Tartrate] Other (See Comments)    Side effect "lost a couple days"; memory loss   Cefzil [Cefprozil] Diarrhea   Codeine Itching and Nausea And Vomiting   Demerol [Meperidine] Other (See Comments)    "messes up senses"   Formaldehyde    Latex Other (  See Comments)    "skin gets raw"   Sulfa Antibiotics Other (See Comments)    Severe abd cramp   Celexa [Citalopram Hydrobromide] Other (See Comments)    Mouth sores     Social History:   Social History   Socioeconomic History   Marital status: Married    Spouse name: Herbie Baltimore   Number of children: 2   Years of education: Not on file   Highest education level: Not on file  Occupational History   Occupation: retired, Therapist, sports  Tobacco Use   Smoking status: Former    Packs/day: 1.50    Years: 3.00    Total pack years: 4.50    Types: Cigarettes    Quit date: 10/03/1968    Years since quitting: 53.2   Smokeless tobacco: Never  Vaping Use   Vaping Use: Never used  Substance and Sexual Activity   Alcohol use: No   Drug use: No   Sexual activity: Not Currently    Birth control/protection: None  Other Topics Concern   Not on file  Social History Narrative   2 daughters, Amy and Sharyn Lull. Amy lives with parents. Sharyn Lull lives in [place].   Social Determinants of Health   Financial Resource Strain: Low Risk  (12/30/2020)   Overall Financial Resource Strain (CARDIA)    Difficulty of Paying Living Expenses: Not hard at all  Food Insecurity: No Food Insecurity (07/15/2021)   Hunger Vital Sign    Worried About Running Out of Food in the Last Year: Never true    Ran Out of Food in the Last Year: Never true  Transportation Needs: No Transportation Needs (07/15/2021)   PRAPARE - Hydrologist (Medical): No    Lack of Transportation (Non-Medical): No  Physical Activity: Insufficiently Active (12/30/2020)   Exercise Vital Sign    Days of Exercise per Week: 3 days    Minutes of Exercise per Session: 20 min  Stress: No Stress Concern Present (12/30/2020)   [place] of Stress : Not at all  Social Connections: [place] (12/30/2020)   Social Connection and Isolation Panel [NHANES]    Frequency of Communication with Friends and Family: More than three times a week    Frequency of Social Gatherings with Friends and Family: More than three times  a week    Attends Religious Services: 1 to 4 times per year    Active Member of [employer] or Organizations: Yes    Attends Archivist Meetings: 1 to 4 times per year    Marital Status: Married  Human resources officer Violence: Not At Risk (12/30/2020)   Humiliation, Afraid, Rape, and Kick questionnaire    Fear of Current or Ex-Partner: No    Emotionally Abused: No    Physically Abused: No    Sexually Abused: No    Family History:    Family History  Problem Relation Age of Onset   Rectal cancer Brother 35   Diabetes Brother    Ovarian cancer Mother 47   Hypertension Mother    Diabetes Mother    Cancer Mother    Depression Mother    Early death Mother    Obesity Mother    Hypertension Father    Diabetes Father    Depression Father    Hyperlipidemia Father    Stroke Father    Breast cancer Maternal Aunt    Breast cancer Paternal Aunt  four Paternal aunts   Heart disease Paternal Grandfather    Hyperlipidemia Paternal Grandfather    Hypertension Paternal Grandfather    Stroke Paternal Grandfather    Liver disease Sister        x 2; fatty liver   Hearing loss Maternal Grandfather    Hypertension Maternal Grandfather    Heart disease Maternal Grandmother    Hypertension Maternal Grandmother    Obesity Maternal Grandmother    Obesity Paternal Grandmother    ADD / ADHD Daughter    Depression Daughter    ADD / ADHD Daughter    Depression Daughter    Alcohol abuse Maternal Uncle    Depression Maternal Uncle    Drug abuse Maternal Uncle    Early death Maternal Uncle    Alcohol abuse Paternal Uncle    Depression Paternal Uncle    Drug abuse Paternal Uncle    Anxiety disorder Sister    Depression Sister    Hyperlipidemia Sister    Miscarriages / Stillbirths Sister    Cancer Brother    Depression Brother    Early death Brother    Cancer Maternal Aunt    Cancer Maternal Aunt    Cancer Paternal Aunt    Cancer Paternal Aunt    Cancer Paternal Aunt    Cancer  Paternal Aunt    Drug abuse Brother    Heart disease Maternal Uncle    Hyperlipidemia Maternal Uncle    Hypertension Maternal Uncle    Hyperlipidemia Sister    Hyperlipidemia Brother    Hyperlipidemia Brother    Hypertension Maternal Uncle    Pancreatic cancer Neg Hx    Esophageal cancer Neg Hx      ROS:  Please see the history of present illness.  All other ROS reviewed and negative.     Physical Exam/Data:   Vitals:   12/11/21 1430 12/11/21 1530 12/11/21 1545 12/11/21 1555  BP: (!) 195/98 (!) 217/94    Pulse: (!) 48 (!) 32 (!) 35   Resp: 11 17 (!) 27   Temp:    98.1 F (36.7 C)  TempSrc:    Oral  SpO2: 98% 95% 98%   Weight:      Height:       No intake or output data in the 24 hours ending 12/11/21 1632    12/11/2021   11:36 AM 10/26/2021    3:15 PM 09/28/2021    2:54 PM  Last 3 Weights  Weight (lbs) 216 lb 214 lb 6.4 oz 219 lb 6.4 oz  Weight (kg) 97.977 kg 97.251 kg 99.519 kg     Body mass index is 43.63 kg/m.   Gen: no distress, Morbid obesity   Neck: No JVD Cardiac: No Rubs or Gallops, no murmur, IRIR +2 radial pulses Respiratory: Clear to auscultation bilaterally, normal effort, normal  respiratory rate GI: Soft, nontender, non-distended  MS: non-pitting edema R > L;  moves all extremities Integument: Skin feels warm Neuro:  At time of evaluation, alert and oriented to person/place/time/situation  Psych: Normal affect, patient feels well   EKG:  The EKG was personally reviewed and demonstrates: NSR, HR 64 with isolated PVC but no acute ST changes. Qtc 550   Relevant CV Studies:  Echocardiogram: 01/2020 IMPRESSIONS     1. Frequent ectopy affects interpretation of LV systolic function. Left  ventricular ejection fraction, by estimation, is 55 to 60%. The left  ventricle has normal function. The left ventricle has no regional wall  motion abnormalities.  There is moderate  left ventricular hypertrophy.   2. Right ventricular systolic function is  normal. The right ventricular  size is normal.   3. Limited echo to evaluate LV function   Event Monitor: 01/2020 Enrollment 01/30/2020-02/02/2020 (3 days 3 hours). Patient had a min HR of 59 bpm (sinus bradycardia), max HR of 134 bpm (sinus tachycardia), and avg HR of 76 bpm (normal sinus rhythm). Predominant underlying rhythm was Sinus Rhythm. 1 run of Supraventricular Tachycardia occurred lasting 5 beats (3.2 second duration of atrial tachycardia) with a max rate of 105 bpm (avg 94 bpm). Isolated SVEs were rare (<1.0%), SVE Couplets were rare (<1.0%), and SVE Triplets were rare (<1.0%). Isolated VEs were frequent (22.0%, 75672), VE Couplets were rare (<1.0%, 1070), and VE Triplets were rare (<1.0%, 8). Ventricular Bigeminy and Trigeminy were present. Diary summarized below:   02/01/20 12:53pm lightheaded coincided with normal sinus rhythm 78 bpm with PVCs.    Impression:   1. Frequent PVCs of unifocal origin (22% burden).  2. No significant arrhythmias.   Laboratory Data:  High Sensitivity Troponin:   Recent Mack  Lab 12/11/21 1417  TROPONINIHS 17     Chemistry Recent Mack  Lab 12/11/21 1417  NA 140  K 2.7*  CL 109  CO2 24  GLUCOSE 152*  BUN 23  CREATININE 1.41*  CALCIUM 9.0  GFRNONAA 38*  ANIONGAP 7    Recent Mack  Lab 12/11/21 1417  PROT 7.4  ALBUMIN 3.2*  AST 24  ALT 25  ALKPHOS 73  BILITOT 0.7   Lipids No results for input(s): "CHOL", "TRIG", "HDL", "LABVLDL", "LDLCALC", "CHOLHDL" in the last 168 hours.  Hematology Recent Mack  Lab 12/11/21 1417  WBC 9.5  RBC 4.50  HGB 12.2  HCT 37.5  MCV 83.3  MCH 27.1  MCHC 32.5  RDW 14.3  PLT 393   Thyroid No results for input(s): "TSH", "FREET4" in the last 168 hours.  BNP Recent Mack  Lab 12/11/21 1417  BNP 380.0*    DDimer No results for input(s): "DDIMER" in the last 168 hours.   Radiology/Studies:  DG Chest 2 View  Result Date: 12/11/2021 CLINICAL DATA:  Dyspnea, leg swelling EXAM: CHEST - 2 VIEW  COMPARISON:  11/06/2019 FINDINGS: The heart size and mediastinal contours are within normal limits. Both lungs are clear. The visualized skeletal structures are unremarkable. IMPRESSION: No active cardiopulmonary disease. Electronically Signed   By: Kathreen Devoid M.D.   On: 12/11/2021 13:40   US Venous Img Lower Unilateral Right  Result Date: 12/11/2021 CLINICAL DATA:  Right lower extremity pain and edema. EXAM: RIGHT LOWER EXTREMITY VENOUS DOPPLER ULTRASOUND TECHNIQUE: Gray-scale sonography with graded compression, as well as color Doppler and duplex ultrasound were performed to evaluate the lower extremity deep venous systems from the level of the common femoral vein and including the common femoral, femoral, profunda femoral, popliteal and calf veins including the posterior tibial, peroneal and gastrocnemius veins when visible. The superficial great saphenous vein was also interrogated. Spectral Doppler was utilized to evaluate flow at rest and with distal augmentation maneuvers in the common femoral, femoral and popliteal veins. COMPARISON:  10/02/2021 FINDINGS: Contralateral Common Femoral Vein: Respiratory phasicity is normal and symmetric with the symptomatic side. No evidence of thrombus. Normal compressibility. Common Femoral Vein: No evidence of thrombus. Normal compressibility, respiratory phasicity and response to augmentation. Saphenofemoral Junction: No evidence of thrombus. Normal compressibility and flow on color Doppler imaging. Profunda Femoral Vein: No evidence of thrombus. Normal compressibility and flow  on color Doppler imaging. Femoral Vein: No evidence of thrombus. Normal compressibility, respiratory phasicity and response to augmentation. Popliteal Vein: No evidence of thrombus. Normal compressibility, respiratory phasicity and response to augmentation. Calf Veins: No evidence of thrombus. Normal compressibility and flow on color Doppler imaging. Superficial Great Saphenous Vein: No  evidence of thrombus. Normal compressibility. Venous Reflux:  None. Other Findings: No evidence of superficial thrombophlebitis. Previously visualized small popliteal fossa Baker's cyst is not seen on today's study. IMPRESSION: 1. No evidence of right lower extremity DVT. 2. Previously noted small right popliteal fossa Baker's cyst is not visualized today. Electronically Signed   By: Aletta Edouard M.D.   On: 12/11/2021 13:28     Assessment and Plan:   1. Bradycardia/PVC's -She reported heart rates in the 30s at home and it is unclear if this is due to baseline bradycardia or her home monitor picking up frequent PVC's. She is also hypokalemic which could be contributing and potassium supplementation has been ordered. Would hold Cardizem CD at the time of admission and follow rates with this. No indication for temporary pacer at this time.  If there is no objective measure of bradycardia on telemetry, this is likely PVC's not true bradycardia and would resume her diltiazem.  K goal of 4, Mg goal of 2.  2. Hypertensive Urgency - may be related erroneous measurement, but if her BP is still elevated on manual would start hydralazine 25 mg PO BID and uptitrate as needed  3. Hypothyroidism - TSH was at 16.9 in 09/2021. Would recheck TSH with AM Mack. On Synthroid 125 mcg daily as an outpatient.   4. Acute HFpEF - She reports worsening dyspnea and BNP elevated to 380 on admission. Will obtain a follow-up echocardiogram. I suspect once her nausea and electrolytes improve she will need low dose lasix returned given her stopping her SGLT2i due to nausea  5. Hypokalemia - K+ at 2.7 and replacement has been ordered. Try to keep K+ ~ 4.0.  6. HLD - LDL was at 83 in 01/2021. Remains on Crestor 33m four days per week as she has been intolerant to higher doses.   7. Prolonged Qtc - likely electrolyte mediated; avoid anti-emetics; would get EKG tomorrow once her electrolytes have normalized.  Suspect she  will go home back on diltiazem off of SGLT2i, on low dose or PRN lasix, with updated echo and repeat EKG once electrolytes are normal Discussed with patient and both daughters, and TWare Placecolleagues   For questions or updates, please contact CCarlislePlease consult [email or web address] for contact info under   MRudean Haskell MD [place] [id number] [place] [phone] 5:02 PM

## 2021-12-11 NOTE — ED Notes (Signed)
Dietary called for food tray, was stated that a ticket would be put in for pt

## 2021-12-11 NOTE — Telephone Encounter (Signed)
Patient reported heart rate 3f34 and feeling like she is going to fall over, she reported heavy breathing and her head is swimming when she stands up , lips are purple and has a hx of cardiac problems. Diarrhea x 3 this morning as well as nausea. Patient and daughter have decided to call 911 instead of waiting for the 4 pm appt .

## 2021-12-11 NOTE — ED Provider Notes (Signed)
[facility] Provider Note   CSN: [id number] Arrival date & time: 12/11/21  1053     History  No chief complaint on file.   Kristen Mack is a 76 y.o. female.  HPI 76 year old female presents with a chief complaint of low heart rate and lightheadedness.  History is from patient and daughter at the bedside.  Patient had a little bit of dyspnea last night that responded to albuterol inhaler.  This morning had a little more dyspnea but the inhaler did not seem to help.  She was then noting to be lightheaded, especially with trying to do anything.  No headache.  She is also had multiple episodes of diarrhea.  She think she might of missed a dose or 2 of Lasix and has noticed some leg swelling over the last few days and has also noticed right calf pain and swelling greater than the left.  She had that several months ago with a negative ultrasound per her PCP occurring.  No chest pain.   No focal weakness or numbness or abdominal pain.  She had diarrhea upon first arriving to the ER and states that when she got up to walk to go the bathroom the lightheadedness seemed to have resolved.  She took all of her typical morning meds this morning.  When she took her heart rate and blood pressure this morning, the heart rate on the BP monitor was saying it was in the 30s.  Her blood pressure was 60s over 30s at first and then on recheck was in the 200s.  She has more comorbidities including CKD, diabetes, OSA on CPAP, hypertension.  Home Medications Prior to Admission medications   Medication Sig Start Date End Date Taking? Authorizing Provider  albuterol (VENTOLIN HFA) 108 (90 Base) MCG/ACT inhaler INHALE 2 PUFFS INTO LUNGS EVERY 6 HOURS AS NEEDED FOR SHORTNESS OF BREATH Patient taking differently: 2 puffs every 6 (six) hours as needed. INHALE 2 PUFFS INTO LUNGS EVERY 6 HOURS AS NEEDED FOR SHORTNESS OF BREATH 10/07/20   Kathyrn Drown, MD  Ascorbic Acid (VITAMIN C) 1000 MG  tablet Take 6,000 mg by mouth daily.     [provider]  Azelastine HCl 137 MCG/SPRAY SOLN USE 2 SPRAY(S) IN EACH NOSTRIL TWICE DAILY Patient taking differently: Place 2 sprays into the nose 2 (two) times daily. 08/27/20   Kathyrn Drown, MD  B Complex Vitamins (B COMPLEX 100 PO) Take 1 tablet by mouth daily.    [provider]  blood glucose meter kit and supplies Dispense based on patient and insurance preference. Use to test sugars BID  (FOR ICD-10 E10.9, E11.9). 07/17/21   Kathyrn Drown, MD  buPROPion (WELLBUTRIN XL) 300 MG 24 hr tablet Take 1 tablet (300 mg total) by mouth daily. Patient taking differently: Take 300 mg by mouth at bedtime. 10/07/20   Kathyrn Drown, MD  Chromium Picolinate 1000 MCG TABS Take 1 tablet by mouth daily.    [provider]  Coenzyme Q10 (COQ10) 200 MG CAPS Take 200 mg by mouth daily.    [provider]  Continuous Blood Gluc Receiver (FREESTYLE LIBRE 2 READER) DEVI As directed 08/10/21   Cassandria Anger, MD  Continuous Blood Gluc Sensor (FREESTYLE LIBRE 2 SENSOR) MISC 1 Piece by Does not apply route every 14 (fourteen) days. 08/10/21   Cassandria Anger, MD  Cyanocobalamin (B-12) 1000 MCG SUBL Place 1,000 mcg under the tongue daily.    [provider]  DIGESTIVE ENZYMES PO Take 1 tablet by mouth 3 (three) times daily.     [provider]  diltiazem (CARDIZEM CD) 120 MG 24 hr capsule Take 1 capsule by mouth once daily Patient taking differently: 120 mg at bedtime. 03/31/21   Geralynn Rile, MD  empagliflozin (JARDIANCE) 10 MG TABS tablet TAKE 1 TABLET BY MOUTH ONCE DAILY BEFORE BREAKFAST 09/08/21   Kathyrn Drown, MD  gabapentin (NEURONTIN) 100 MG capsule Take 1 capsule (100 mg total) by mouth 2 (two) times daily. 10/02/21   Kathyrn Drown, MD  glucose blood test strip Use to check blood glucose four times daily as instructed 09/03/21   Cassandria Anger, MD  ipratropium (ATROVENT) 0.02 % nebulizer  solution USE ONE VIAL IN NEBULIZER 4 TIMES DAILY AS NEEDED FOR SHORTNESS OF BREATH Patient taking differently: every 4 (four) hours as needed. USE ONE VIAL IN NEBULIZER 4 TIMES DAILY AS NEEDED FOR SHORTNESS OF BREATH 02/18/18   Kathyrn Drown, MD  LEVEMIR FLEXPEN 100 UNIT/ML FlexPen INJECT 50 UNITS SUBCUTANEOUSLY EVERYDAY AT BEDTIME 11/12/21   Cassandria Anger, MD  levothyroxine (SYNTHROID) 125 MCG tablet TAKE 1 TABLET BY MOUTH ONCE DAILY BEFORE BREAKFAST 10/30/21   Nida, Marella Chimes, MD  Lysine HCl 500 MG TABS Take 500 mg by mouth daily.    [provider]  Magnesium 250 MG TABS Take 250 mg by mouth 2 (two) times daily.    [provider]  meclizine (ANTIVERT) 25 MG tablet Take 25 mg by mouth 3 (three) times daily as needed for dizziness.    [provider]  montelukast (SINGULAIR) 10 MG tablet TAKE 1 TABLET BY MOUTH AT BEDTIME 10/30/21   Luking, Elayne Snare, MD  OVER THE COUNTER MEDICATION Take 1 Scoop by mouth 3 (three) times daily with meals. Fennel seeds    [provider]  OVER THE COUNTER MEDICATION Take 1 tablet by mouth at bedtime as needed (sleep). Sleep essentials    [provider]  rosuvastatin (CRESTOR) 5 MG tablet TAKE 1 TABLET BY MOUTH ON MONDAY, WEDNESDAY,  FRIDAY AND SATURDAY 09/03/21   Kathyrn Drown, MD  TART CHERRY PO Take 1 tablet by mouth in the morning, at noon, and at bedtime.    [provider]  tiZANidine (ZANAFLEX) 2 MG tablet 1 twice daily  prn caution drowsiness 10/07/21   Kathyrn Drown, MD  traMADol (ULTRAM) 50 MG tablet Take 1 tablet by mouth twice daily as needed for pain 10/05/21   Kathyrn Drown, MD  Vitamin A 3 MG (10000 UT) TABS Take 10,000 Units by mouth daily.    [provider]  vitamin E 180 MG (400 UNITS) capsule Take 800 Units by mouth daily.     [provider]      Allergies    Ambien [zolpidem tartrate], Cefzil [cefprozil], Codeine, Demerol [meperidine], Formaldehyde, Latex, Sulfa  antibiotics, and Celexa [citalopram hydrobromide]    Review of Systems   Review of Systems  Constitutional:  Positive for fatigue. Negative for fever.  Respiratory:  Positive for shortness of breath. Negative for cough.   Cardiovascular:  Positive for leg swelling. Negative for chest pain.  Gastrointestinal:  Positive for diarrhea. Negative for abdominal pain.  Neurological:  Positive for light-headedness. Negative for numbness.    Physical Exam Updated Vital Signs BP (!) 217/94   Pulse (!) 35   Temp 98.1 F (36.7 C) (Oral)   Resp (!) 27   Ht 4' 11"  (1.499 m)  Wt 98 kg   SpO2 98%   BMI 43.63 kg/m  Physical Exam Vitals and nursing note reviewed.  Constitutional:      General: She is not in acute distress.    Appearance: She is well-developed. She is obese. She is not ill-appearing or diaphoretic.  HENT:     Head: Normocephalic and atraumatic.  Eyes:     Extraocular Movements: Extraocular movements intact.     Pupils: Pupils are equal, round, and reactive to light.  Cardiovascular:     Rate and Rhythm: Normal rate. Rhythm irregular.     Heart sounds: Normal heart sounds.     Comments: Frequent PVCs Pulmonary:     Effort: Pulmonary effort is normal.     Breath sounds: Normal breath sounds.  Abdominal:     Palpations: Abdomen is soft.     Tenderness: There is no abdominal tenderness.  Musculoskeletal:     Comments: Both ankles seem to be swollen though no pitting edema.  There is right-sided calf pain.  Perhaps mildly worse swelling on the right compared to the left.  Skin:    General: Skin is warm and dry.  Neurological:     Mental Status: She is alert.     Comments: CN 3-12 grossly intact. 5/5 strength in all 4 extremities. Grossly normal sensation. Normal finger to nose.      ED Results / Procedures / Treatments   Labs (all labs ordered are listed, but only abnormal results are displayed) Labs Reviewed  COMPREHENSIVE METABOLIC PANEL - Abnormal; Notable for the  following components:      Result Value   Potassium 2.7 (*)    Glucose, Bld 152 (*)    Creatinine, Ser 1.41 (*)    Albumin 3.2 (*)    GFR, Estimated 38 (*)    All other components within normal limits  BRAIN NATRIURETIC PEPTIDE - Abnormal; Notable for the following components:   B Natriuretic Peptide 380.0 (*)    All other components within normal limits  CBG MONITORING, ED - Abnormal; Notable for the following components:   Glucose-Capillary 177 (*)    All other components within normal limits  SARS CORONAVIRUS 2 BY RT PCR  CBC WITH DIFFERENTIAL/PLATELET  URINALYSIS, ROUTINE W REFLEX MICROSCOPIC  MAGNESIUM  TROPONIN I (HIGH SENSITIVITY)  TROPONIN I (HIGH SENSITIVITY)    EKG EKG Interpretation  Date/Time:  Friday December 11 2021 15:34:35 EDT Ventricular Rate:  64 PR Interval:  192 QRS Duration: 101 QT Interval:  540 QTC Calculation: 558 R Axis:   -1 Text Interpretation: Sinus rhythm Ventricular premature complex Minimal ST depression, anterolateral leads Prolonged QT interval Confirmed by Sherwood Gambler [id number]) on 12/11/2021 3:52:27 PM  Radiology DG Chest 2 View  Result Date: 12/11/2021 CLINICAL DATA:  Dyspnea, leg swelling EXAM: CHEST - 2 VIEW COMPARISON:  11/06/2019 FINDINGS: The heart size and mediastinal contours are within normal limits. Both lungs are clear. The visualized skeletal structures are unremarkable. IMPRESSION: No active cardiopulmonary disease. Electronically Signed   By: Kathreen Devoid M.D.   On: 12/11/2021 13:40   US Venous Img Lower Unilateral Right  Result Date: 12/11/2021 CLINICAL DATA:  Right lower extremity pain and edema. EXAM: RIGHT LOWER EXTREMITY VENOUS DOPPLER ULTRASOUND TECHNIQUE: Gray-scale sonography with graded compression, as well as color Doppler and duplex ultrasound were performed to evaluate the lower extremity deep venous systems from the level of the common femoral vein and including the common femoral, femoral, profunda femoral,  popliteal and calf veins including the  posterior tibial, peroneal and gastrocnemius veins when visible. The superficial great saphenous vein was also interrogated. Spectral Doppler was utilized to evaluate flow at rest and with distal augmentation maneuvers in the common femoral, femoral and popliteal veins. COMPARISON:  10/02/2021 FINDINGS: Contralateral Common Femoral Vein: Respiratory phasicity is normal and symmetric with the symptomatic side. No evidence of thrombus. Normal compressibility. Common Femoral Vein: No evidence of thrombus. Normal compressibility, respiratory phasicity and response to augmentation. Saphenofemoral Junction: No evidence of thrombus. Normal compressibility and flow on color Doppler imaging. Profunda Femoral Vein: No evidence of thrombus. Normal compressibility and flow on color Doppler imaging. Femoral Vein: No evidence of thrombus. Normal compressibility, respiratory phasicity and response to augmentation. Popliteal Vein: No evidence of thrombus. Normal compressibility, respiratory phasicity and response to augmentation. Calf Veins: No evidence of thrombus. Normal compressibility and flow on color Doppler imaging. Superficial Great Saphenous Vein: No evidence of thrombus. Normal compressibility. Venous Reflux:  None. Other Findings: No evidence of superficial thrombophlebitis. Previously visualized small popliteal fossa Baker's cyst is not seen on today's study. IMPRESSION: 1. No evidence of right lower extremity DVT. 2. Previously noted small right popliteal fossa Baker's cyst is not visualized today. Electronically Signed   By: Aletta Edouard M.D.   On: 12/11/2021 13:28    Procedures .Critical Care  Performed by: Sherwood Gambler, MD Authorized by: Sherwood Gambler, MD   Critical care provider statement:    Critical care time (minutes):  30   Critical care time was exclusive of:  Separately billable procedures and treating other patients   Critical care was necessary to  treat or prevent imminent or life-threatening deterioration of the following conditions:  Metabolic crisis and cardiac failure   Critical care was time spent personally by me on the following activities:  Development of treatment plan with patient or surrogate, discussions with consultants, evaluation of patient's response to treatment, examination of patient, ordering and review of laboratory studies, ordering and review of radiographic studies, ordering and performing treatments and interventions, pulse oximetry, re-evaluation of patient's condition and review of old charts     Medications Ordered in ED Medications  potassium chloride SA (KLOR-CON M) CR tablet 40 mEq (has no administration in time range)  potassium chloride 10 mEq in 100 mL IVPB (has no administration in time range)  magnesium sulfate IVPB 2 g 50 mL (has no administration in time range)    ED Course/ Medical Decision Making/ A&P                           Medical Decision Making Amount and/or Complexity of Data Reviewed Independent Historian:     Details: daughter Labs: ordered.    Details: K 2.7 Radiology: ordered and independent interpretation performed.    Details: CXR without CHF ECG/medicine tests: independent interpretation performed.    Details: Prolonged QTC - 558  Risk Prescription drug management.   Patient has no focal deficits.  DVT study shows no DVT.  Chest x-ray is clear.  EKG shows no acute ischemia but has prolonged QTc at 558.  In combination with her hypokalemia down to 2.7, as well as frequent PVCs, I think she will need admission on telemetry and potassium replacement.  We will give IV and oral potassium as well as IV magnesium.  We will send magnesium level.  She will need admission to the hospitalist service. Discussed with Dr. Waldron Labs for admission.        Final Clinical Impression(s) /  ED Diagnoses Final diagnoses:  Hypokalemia  Hypertensive urgency    Rx / DC Orders ED  Discharge Orders          Ordered    potassium chloride SA (KLOR-CON M) 20 MEQ tablet  2 times daily,   Status:  Discontinued        12/11/21 1546              Sherwood Gambler, MD 12/11/21 1606

## 2021-12-11 NOTE — Telephone Encounter (Signed)
What matters here is the clinical picture. If the patient is breathing okay not in any distress such as looking like she is going to pass out.  And is is she is able to drink and keep things down?  Is she having any type of allergic reaction such as hives difficulty breathing difficulty swallowing excessive vomiting if so ER.  If not then we could see her at 4:00 today here (She could come through the front entrance) Wrist blood pressure cuffs are incredibly variable in their readings.  It is hard to understand why blood pressure would read so low and then 5 minutes were related to read excessively high.  I would encourage nurses to talk with family to get a feel for if she looks like she ought to go to emergency department because she is unstable versus stable enough to do a visit later today Venously if they choose to 4 PM visit if she gets significantly worse as the day goes on may need to go to the ER-4 PM soonest we can work her in

## 2021-12-11 NOTE — ED Notes (Signed)
Pt's BP 203/104, PRN hydralazine given

## 2021-12-11 NOTE — Telephone Encounter (Signed)
Patient's daughter is calling in after reading abnormal BP on a wrist cuff at home- 67/37 - p 91 and 5 mins later 180/97 - p 60 . States has somewhat of a dumping syndrome and had loose stool x 2 this morning. Please advise.

## 2021-12-11 NOTE — H&P (Signed)
TRH H&P   Patient Demographics:    Kristen Mack, is a 76 y.o. female  MRN: [id number]   DOB - [birth year]  Admit Date - 12/11/2021  Outpatient Primary MD for the patient is Kathyrn Drown, MD  Referring MD/NP/PA: Dr Regenia Skeeter  Outpatient Specialists: cardiology Dr Philippa Sicks, renal Dr Joelyn Oms    Patient coming from: home  No chief complaint on file.     HPI:    Kristen Mack  is a 76 y.o. 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, as well she does report diarrhea, which has been intermittent, but has worsened much recently, as well she does report mild dyspnea, it did respond to albuterol inhaler, reports she is more dyspneic this morning, she felt lightheaded, reports 3 bowel movements already today, mix between soft and watery, she denies any chest pain, cough, fever or chills, patient is retired [employer] scientist, report when she took her heart rate and blood pressure this morning heart rate was staying in the 30s, when she did check blood pressure at home was 60s over 30s, upon repeat was in the 200s, so she came to ED for further evaluation -In ED she was noted to have heart rate in the 30s initially, currently improved to mid 50s, potassium was significantly low at 2.7, fattening was at 1.4, baseline 1.5-1.7, QTc was prolonged , chest x-ray was with no active disease, hospitalist consulted to admit   Review of systems:     A full 10 point Review of Systems was done, except as stated above, all other Review of Systems were negative.   With Past History of the following :    Past Medical History:  Diagnosis Date   ADHD (attention deficit hyperactivity disorder) 2007   Adrenal adenoma, left     followed by dr nida   Arthritis    Asthma    followed by pcp   CFS (chronic fatigue syndrome)    CKD (chronic kidney disease), stage III [facility])    nephrologist---  dr Joelyn Oms   Depression    Edema of both lower extremities    Fatty liver disease, nonalcoholic    Fibromyalgia    GERD (gastroesophageal reflux disease)    Hiatal hernia    History of adenomatous polyp of colon    History of cardiac murmur    until age 54- pt has had echocardiograms   History of TIA (transient ischemic attack)    per CT scan    Hyperlipidemia    Hypertension    Hypothyroidism    endocrinologist-- dr g. nida   IBS (irritable bowel syndrome)    Meniere disease    OSA on CPAP    per pt  uses nightly   Osteopenia    Pancreas cyst    followed by pcp   PMB (postmenopausal bleeding)    PVC's (premature ventricular contractions)    cardiologist--- dr Audie Box   Type 2 diabetes mellitus treated with insulin [facility])    endocrinologist-- dr g. nida;    (08-14-2021  per pt checks blood sugar QID ,  fasting average --- 73-150)   Urinary incontinence    Wears glasses    Wears partial dentures    upper      Past Surgical History:  Procedure Laterality Date   CERVICAL POLYPECTOMY N/A 08/17/2021   Procedure: BIOPSY OF ENDOMETRIUM AND OR POLYPECTOMY;  Surgeon: Sanjuana Kava, MD;  Location: [facility];  Service: Gynecology;  Laterality: N/A;   [place]   and  REPAIR UMBILICAL HERNIA   COLONOSCOPY N/A 04/19/2013   Procedure: COLONOSCOPY;  Surgeon: Rogene Houston, MD;  [facility];  Service: Endoscopy;  Laterality: N/A;     DILITATION & CURRETTAGE/HYSTROSCOPY WITH NOVASURE ABLATION N/A 08/17/2021   Procedure: DILATATION & CURETTAGE/HYSTEROSCOPY;  Surgeon: Sanjuana Kava, MD;  Location: [place];  Service: Gynecology;  Laterality: N/A;   ESOPHAGOGASTRODUODENOSCOPY  02/08/2003   HERNIA REPAIR  1995   HYSTEROSCOPY WITH D & C   11/14/2002   endometrial polypectomy   HYSTEROSCOPY WITH D & C N/A 04/05/2014   Procedure: DILATATION AND CURETTAGE /HYSTEROSCOPY, ENDOMETRIAL POLYPECTOMY;  Surgeon: Sanjuana Kava, MD;  Location: [place];  Service: Gynecology;  Laterality: N/A;   LIVER BIOPSY  01/29/2004   benign   TUBAL LIGATION  1979      Social History:     Social History   Tobacco Use   Smoking status: Former    Packs/day: 1.50    Years: 3.00    Total pack years: 4.50    Types: Cigarettes    Quit date: 10/03/1968    Years since quitting: 53.2   Smokeless tobacco: Never  Substance Use Topics   Alcohol use: No       Family History :     Family History  Problem Relation Age of Onset   Rectal cancer Brother 15   Diabetes Brother    Ovarian cancer Mother 33   Hypertension Mother    Diabetes Mother    Cancer Mother    Depression Mother    Early death Mother    Obesity Mother    Hypertension Father    Diabetes Father    Depression Father    Hyperlipidemia Father    Stroke Father    Breast cancer Maternal Aunt    Breast cancer Paternal Aunt        four Paternal aunts   Heart disease Paternal Grandfather    Hyperlipidemia Paternal Grandfather    Hypertension Paternal Grandfather    Stroke Paternal Grandfather    Liver disease Sister        x 2; fatty liver   Hearing loss Maternal Grandfather    Hypertension Maternal Grandfather    Heart disease Maternal Grandmother    Hypertension Maternal Grandmother    Obesity Maternal Grandmother    Obesity Paternal 60    ADD / ADHD Daughter    Depression Daughter    ADD / ADHD Daughter    Depression Daughter    Alcohol abuse Maternal Uncle    Depression Maternal Uncle    Drug abuse Maternal Uncle  Early death Maternal Uncle    Alcohol abuse Paternal Uncle    Depression Paternal Uncle    Drug abuse Paternal Uncle    Anxiety disorder Sister    Depression Sister    Hyperlipidemia Sister    Miscarriages / Stillbirths  Sister    Cancer Brother    Depression Brother    Early death Brother    Cancer Maternal Aunt    Cancer Maternal Aunt    Cancer Paternal Aunt    Cancer Paternal Aunt    Cancer Paternal Aunt    Cancer Paternal Aunt    Drug abuse Brother    Heart disease Maternal Uncle    Hyperlipidemia Maternal Uncle    Hypertension Maternal Uncle    Hyperlipidemia Sister    Hyperlipidemia Brother    Hyperlipidemia Brother    Hypertension Maternal Uncle    Pancreatic cancer Neg Hx    Esophageal cancer Neg Hx       Home Medications:   Prior to Admission medications   Medication Sig Start Date End Date Taking? Authorizing Provider  albuterol (VENTOLIN HFA) 108 (90 Base) MCG/ACT inhaler INHALE 2 PUFFS INTO LUNGS EVERY 6 HOURS AS NEEDED FOR SHORTNESS OF BREATH Patient taking differently: 2 puffs every 6 (six) hours as needed. INHALE 2 PUFFS INTO LUNGS EVERY 6 HOURS AS NEEDED FOR SHORTNESS OF BREATH 10/07/20   Kathyrn Drown, MD  Ascorbic Acid (VITAMIN C) 1000 MG tablet Take 6,000 mg by mouth daily.     [provider]  Azelastine HCl 137 MCG/SPRAY SOLN USE 2 SPRAY(S) IN EACH NOSTRIL TWICE DAILY Patient taking differently: Place 2 sprays into the nose 2 (two) times daily. 08/27/20   Kathyrn Drown, MD  B Complex Vitamins (B COMPLEX 100 PO) Take 1 tablet by mouth daily.    [provider]  blood glucose meter kit and supplies Dispense based on patient and insurance preference. Use to test sugars BID  (FOR ICD-10 E10.9, E11.9). 07/17/21   Kathyrn Drown, MD  buPROPion (WELLBUTRIN XL) 300 MG 24 hr tablet Take 1 tablet (300 mg total) by mouth daily. Patient taking differently: Take 300 mg by mouth at bedtime. 10/07/20   Kathyrn Drown, MD  Chromium Picolinate 1000 MCG TABS Take 1 tablet by mouth daily.    [provider]  Coenzyme Q10 (COQ10) 200 MG CAPS Take 200 mg by mouth daily.    [provider]  Continuous Blood Gluc Receiver (FREESTYLE LIBRE 2 READER) DEVI As  directed 08/10/21   Cassandria Anger, MD  Continuous Blood Gluc Sensor (FREESTYLE LIBRE 2 SENSOR) MISC 1 Piece by Does not apply route every 14 (fourteen) days. 08/10/21   Cassandria Anger, MD  Cyanocobalamin (B-12) 1000 MCG SUBL Place 1,000 mcg under the tongue daily.    [provider]  DIGESTIVE ENZYMES PO Take 1 tablet by mouth 3 (three) times daily.     [provider]  diltiazem (CARDIZEM CD) 120 MG 24 hr capsule Take 1 capsule by mouth once daily Patient taking differently: 120 mg at bedtime. 03/31/21   Geralynn Rile, MD  empagliflozin (JARDIANCE) 10 MG TABS tablet TAKE 1 TABLET BY MOUTH ONCE DAILY BEFORE BREAKFAST 09/08/21   Kathyrn Drown, MD  gabapentin (NEURONTIN) 100 MG capsule Take 1 capsule (100 mg total) by mouth 2 (two) times daily. 10/02/21   Kathyrn Drown, MD  glucose blood test strip Use to check blood glucose four times daily as instructed 09/03/21  Cassandria Anger, MD  ipratropium (ATROVENT) 0.02 % nebulizer solution USE ONE VIAL IN NEBULIZER 4 TIMES DAILY AS NEEDED FOR SHORTNESS OF BREATH Patient taking differently: every 4 (four) hours as needed. USE ONE VIAL IN NEBULIZER 4 TIMES DAILY AS NEEDED FOR SHORTNESS OF BREATH 02/18/18   Kathyrn Drown, MD  LEVEMIR FLEXPEN 100 UNIT/ML FlexPen INJECT 50 UNITS SUBCUTANEOUSLY EVERYDAY AT BEDTIME 11/12/21   Cassandria Anger, MD  levothyroxine (SYNTHROID) 125 MCG tablet TAKE 1 TABLET BY MOUTH ONCE DAILY BEFORE BREAKFAST 10/30/21   Nida, Marella Chimes, MD  Lysine HCl 500 MG TABS Take 500 mg by mouth daily.    [provider]  Magnesium 250 MG TABS Take 250 mg by mouth 2 (two) times daily.    [provider]  meclizine (ANTIVERT) 25 MG tablet Take 25 mg by mouth 3 (three) times daily as needed for dizziness.    [provider]  montelukast (SINGULAIR) 10 MG tablet TAKE 1 TABLET BY MOUTH AT BEDTIME 10/30/21   Luking, Elayne Snare, MD  OVER THE COUNTER MEDICATION Take 1 Scoop by  mouth 3 (three) times daily with meals. Fennel seeds    [provider]  OVER THE COUNTER MEDICATION Take 1 tablet by mouth at bedtime as needed (sleep). Sleep essentials    [provider]  rosuvastatin (CRESTOR) 5 MG tablet TAKE 1 TABLET BY MOUTH ON MONDAY, WEDNESDAY,  FRIDAY AND SATURDAY 09/03/21   Kathyrn Drown, MD  TART CHERRY PO Take 1 tablet by mouth in the morning, at noon, and at bedtime.    [provider]  tiZANidine (ZANAFLEX) 2 MG tablet 1 twice daily  prn caution drowsiness 10/07/21   Kathyrn Drown, MD  traMADol (ULTRAM) 50 MG tablet Take 1 tablet by mouth twice daily as needed for pain 10/05/21   Kathyrn Drown, MD  Vitamin A 3 MG (10000 UT) TABS Take 10,000 Units by mouth daily.    [provider]  vitamin E 180 MG (400 UNITS) capsule Take 800 Units by mouth daily.     [provider]     Allergies:     Allergies  Allergen Reactions   Ambien [Zolpidem Tartrate] Other (See Comments)    Side effect "lost a couple days"; memory loss   Cefzil [Cefprozil] Diarrhea   Codeine Itching and Nausea And Vomiting   Demerol [Meperidine] Other (See Comments)    "messes up senses"   Formaldehyde    Latex Other (See Comments)    "skin gets raw"   Sulfa Antibiotics Other (See Comments)    Severe abd cramp   Celexa [Citalopram Hydrobromide] Other (See Comments)    Mouth sores     Physical Exam:   Vitals  Blood pressure (!) 217/94, pulse (!) 35, temperature 98.1 F (36.7 C), temperature source Oral, resp. rate (!) 27, height 4' 11"  (1.499 m), weight 98 kg, SpO2 98 %.   1. General developed female, lying in bed in no apparent distress,  with obesity  2. Normal affect and insight, Not Suicidal or Homicidal, Awake Alert, Oriented X 3.  3. No F.N deficits, ALL C.Nerves Intact, Strength 5/5 all 4 extremities, Sensation intact all 4 extremities, Plantars down going.  4. Ears and Eyes appear Normal, Conjunctivae clear, PERRLA. Moist Oral  Mucosa.  5. Supple Neck, No JVD, No cervical lymphadenopathy appriciated, No Carotid Bruits.  6. Symmetrical Chest wall movement, Good air movement bilaterally, CTAB.  7.  Heart rate on the lower side,  currently appears to be in the mid 50s on the monitor, No Gallops, Rubs or Murmurs, No Parasternal Heave.  He has trace edema  8. Positive Bowel Sounds, Abdomen Soft, No tenderness, No organomegaly appriciated,No rebound -guarding or rigidity.  9.  No Cyanosis, Normal Skin Turgor, No Skin Rash or Bruise.  10. Good muscle tone,  joints appear normal , no effusions, Normal ROM.     Data Review:    CBC Recent Labs  Lab 12/11/21 1417  WBC 9.5  HGB 12.2  HCT 37.5  PLT 393  MCV 83.3  MCH 27.1  MCHC 32.5  RDW 14.3  LYMPHSABS 1.6  MONOABS 0.7  EOSABS 0.2  BASOSABS 0.1   ------------------------------------------------------------------------------------------------------------------  Chemistries  Recent Labs  Lab 12/11/21 1417  NA 140  K 2.7*  CL 109  CO2 24  GLUCOSE 152*  BUN 23  CREATININE 1.41*  CALCIUM 9.0  AST 24  ALT 25  ALKPHOS 73  BILITOT 0.7   ------------------------------------------------------------------------------------------------------------------ estimated creatinine clearance is 34.3 mL/min (A) (by C-G formula based on SCr of 1.41 mg/dL (H)). ------------------------------------------------------------------------------------------------------------------ No results for input(s): "TSH", "T4TOTAL", "T3FREE", "THYROIDAB" in the last 72 hours.  Invalid input(s): "FREET3"  Coagulation profile No results for input(s): "INR", "PROTIME" in the last 168 hours. ------------------------------------------------------------------------------------------------------------------- No results for input(s): "DDIMER" in the last 72  hours. -------------------------------------------------------------------------------------------------------------------  Cardiac Enzymes No results for input(s): "CKMB", "TROPONINI", "MYOGLOBIN" in the last 168 hours.  Invalid input(s): "CK" ------------------------------------------------------------------------------------------------------------------    Component Value Date/Time   BNP 380.0 (H) 12/11/2021 1417     ---------------------------------------------------------------------------------------------------------------  Urinalysis    Component Value Date/Time   APPEARANCEUR Cloudy (A) 12/31/2016 1134   GLUCOSEU Negative 12/31/2016 1134   BILIRUBINUR Negative 12/02/2020 1445   BILIRUBINUR Negative 12/31/2016 1134   PROTEINUR Negative 12/02/2020 1445   PROTEINUR 2+ (A) 12/31/2016 1134   UROBILINOGEN 0.2 12/02/2020 1445   NITRITE Negative 12/02/2020 1445   NITRITE Negative 12/31/2016 1134   LEUKOCYTESUR Small (1+) (A) 12/02/2020 1445   LEUKOCYTESUR 1+ (A) 12/31/2016 1134    ----------------------------------------------------------------------------------------------------------------   Imaging Results:    DG Chest 2 View  Result Date: 12/11/2021 CLINICAL DATA:  Dyspnea, leg swelling EXAM: CHEST - 2 VIEW COMPARISON:  11/06/2019 FINDINGS: The heart size and mediastinal contours are within normal limits. Both lungs are clear. The visualized skeletal structures are unremarkable. IMPRESSION: No active cardiopulmonary disease. Electronically Signed   By: Kathreen Devoid M.D.   On: 12/11/2021 13:40   US Venous Img Lower Unilateral Right  Result Date: 12/11/2021 CLINICAL DATA:  Right lower extremity pain and edema. EXAM: RIGHT LOWER EXTREMITY VENOUS DOPPLER ULTRASOUND TECHNIQUE: Gray-scale sonography with graded compression, as well as color Doppler and duplex ultrasound were performed to evaluate the lower extremity deep venous systems from the level of the common femoral  vein and including the common femoral, femoral, profunda femoral, popliteal and calf veins including the posterior tibial, peroneal and gastrocnemius veins when visible. The superficial great saphenous vein was also interrogated. Spectral Doppler was utilized to evaluate flow at rest and with distal augmentation maneuvers in the common femoral, femoral and popliteal veins. COMPARISON:  10/02/2021 FINDINGS: Contralateral Common Femoral Vein: Respiratory phasicity is normal and symmetric with the symptomatic side. No evidence of thrombus. Normal compressibility. Common Femoral Vein: No evidence of thrombus. Normal compressibility, respiratory phasicity and response to augmentation. Saphenofemoral Junction: No evidence of thrombus. Normal compressibility and flow on color Doppler imaging. Profunda Femoral Vein: No evidence of thrombus. Normal compressibility and flow on  color Doppler imaging. Femoral Vein: No evidence of thrombus. Normal compressibility, respiratory phasicity and response to augmentation. Popliteal Vein: No evidence of thrombus. Normal compressibility, respiratory phasicity and response to augmentation. Calf Veins: No evidence of thrombus. Normal compressibility and flow on color Doppler imaging. Superficial Great Saphenous Vein: No evidence of thrombus. Normal compressibility. Venous Reflux:  None. Other Findings: No evidence of superficial thrombophlebitis. Previously visualized small popliteal fossa Baker's cyst is not seen on today's study. IMPRESSION: 1. No evidence of right lower extremity DVT. 2. Previously noted small right popliteal fossa Baker's cyst is not visualized today. Electronically Signed   By: Aletta Edouard M.D.   On: 12/11/2021 13:28    My personal review of EKG: Rhythm NSR, PVC, rate  64 /min, QTc 558   Assessment & Plan:    Principal Problem:   Symptomatic bradycardia Active Problems:   GERD (gastroesophageal reflux disease)   IBS (irritable bowel syndrome)   Morbid  obesity (HCC)   HTN (hypertension), benign   NASH (nonalcoholic steatohepatitis)   Sleep apnea   Type 2 DM with CKD stage 3 and hypertension (HCC)   Hypokalemia  Symptomatic bradycardia Weakness/lightheadedness -Presents with lightheadedness, generalized weakness, she was noted with heart rate in the 30s in ED. -We will hold her Cardizem CD due to bradycardia -We will hold her Zanaflex , she is known to cause severe bradycardia as well -May use glucagon as needed if she is having recurrent symptomatic episodes of bradycardia. -Check TSH -cardiology consulted regarding further recommendations -Correct severe hypokalemia  PVCs -Evaluated by cardiology, as an outpatient, PVC burden 22% -For now we will hold her Cardizem due to bradycardia. -Keep potassium>4 and magnesium> 2  Hypertension, uncontrolled -Blood pressure is elevated, but as well it has been checked in the forearm, I have discussed with staff to check manual with XL cough, she will be started on hydralazine as needed, and if blood pressures persistently elevated then can be started scheduled.   Mixed hyperlipidemia Continue with Crestor  Prolonged QTc -Avoid prolonging agents, this is likely due to severe hypokalemia, will correct   Obesity, morbid, BMI 40.0-49.9 (HCC)  Chronic diastolic CHF -Cardiology input appreciated, likely will be resumed on as needed Lasix once her nausea and electrolyte improves.  Diabetes mellitus, type II, insulin-dependent  -Patient reports she stopped taking Jardiance last month -Check A1c -Continue with home dose Levemir -We will add insulin sliding scale  Diarrhea -Very likely related to IBS, but this is more severe than her baseline, will check C. difficile and GI panel, and if negative would start Lomotil (avoid Imodium due to prolonged QTc.  Depression -We will hold Wellbutrin in the setting of severe hypokalemia    DVT Prophylaxis Heparin   AM Labs Ordered, also please  review Full Orders  Family Communication: Admission, patients condition and plan of care including tests being ordered have been discussed with the patient and Daughter who indicate understanding and agree with the plan and Code Status.  Code Status full  Likely DC to  home  Condition GUARDED    Consults called: cardiolgoy    Admission status: inpatient    Time spent in minutes : 75 minutes   Phillips Climes M.D on 12/11/2021 at [place] PM   [facility] - Office  [phone]

## 2021-12-12 ENCOUNTER — Encounter: Payer: Self-pay | Admitting: Cardiovascular Disease

## 2021-12-12 ENCOUNTER — Inpatient Hospital Stay (HOSPITAL_COMMUNITY): Payer: HMO

## 2021-12-12 ENCOUNTER — Encounter (HOSPITAL_COMMUNITY): Payer: Self-pay | Admitting: Internal Medicine

## 2021-12-12 ENCOUNTER — Encounter: Payer: Self-pay | Admitting: Family Medicine

## 2021-12-12 DIAGNOSIS — R0609 Other forms of dyspnea: Secondary | ICD-10-CM

## 2021-12-12 DIAGNOSIS — E1122 Type 2 diabetes mellitus with diabetic chronic kidney disease: Secondary | ICD-10-CM | POA: Diagnosis not present

## 2021-12-12 DIAGNOSIS — Z79899 Other long term (current) drug therapy: Secondary | ICD-10-CM

## 2021-12-12 DIAGNOSIS — E876 Hypokalemia: Secondary | ICD-10-CM

## 2021-12-12 DIAGNOSIS — R001 Bradycardia, unspecified: Secondary | ICD-10-CM | POA: Diagnosis not present

## 2021-12-12 DIAGNOSIS — R9431 Abnormal electrocardiogram [ECG] [EKG]: Secondary | ICD-10-CM

## 2021-12-12 LAB — ECHOCARDIOGRAM COMPLETE
AR max vel: 1.78 cm2
AV Area VTI: 1.93 cm2
AV Area mean vel: 1.93 cm2
AV Mean grad: 10.7 mmHg
AV Peak grad: 21.7 mmHg
Ao pk vel: 2.33 m/s
Area-P 1/2: 3.76 cm2
Height: 59 in
MV VTI: 2.82 cm2
S' Lateral: 3.3 cm
Weight: 3456 oz

## 2021-12-12 LAB — BASIC METABOLIC PANEL
Anion gap: 4 — ABNORMAL LOW (ref 5–15)
BUN: 19 mg/dL (ref 8–23)
CO2: 22 mmol/L (ref 22–32)
Calcium: 8.3 mg/dL — ABNORMAL LOW (ref 8.9–10.3)
Chloride: 114 mmol/L — ABNORMAL HIGH (ref 98–111)
Creatinine, Ser: 1.42 mg/dL — ABNORMAL HIGH (ref 0.44–1.00)
GFR, Estimated: 38 mL/min — ABNORMAL LOW (ref >60)
Glucose, Bld: 121 mg/dL — ABNORMAL HIGH (ref 70–99)
Potassium: 3.5 mmol/L (ref 3.5–5.1)
Sodium: 140 mmol/L (ref 135–145)

## 2021-12-12 LAB — CBC
HCT: 33.9 % — ABNORMAL LOW (ref 36.0–46.0)
Hemoglobin: 10.9 g/dL — ABNORMAL LOW (ref 12.0–15.0)
MCH: 26.9 pg (ref 26.0–34.0)
MCHC: 32.2 g/dL (ref 30.0–36.0)
MCV: 83.7 fL (ref 80.0–100.0)
Platelets: 361 10*3/uL (ref 150–400)
RBC: 4.05 MIL/uL (ref 3.87–5.11)
RDW: 14.6 % (ref 11.5–15.5)
WBC: 10.4 10*3/uL (ref 4.0–10.5)
nRBC: 0 % (ref 0.0–0.2)

## 2021-12-12 LAB — GLUCOSE, CAPILLARY
Glucose-Capillary: 176 mg/dL — ABNORMAL HIGH (ref 70–99)
Glucose-Capillary: 147 mg/dL — ABNORMAL HIGH (ref 70–99)

## 2021-12-12 LAB — MRSA NEXT GEN BY PCR, NASAL: MRSA by PCR Next Gen: NOT DETECTED

## 2021-12-12 LAB — T4, FREE: Free T4: 0.87 ng/dL (ref 0.61–1.12)

## 2021-12-12 MED ORDER — GABAPENTIN 100 MG PO CAPS: 100.0000 mg | ORAL_CAPSULE | Freq: Two times a day (BID) | ORAL | 0 refills | Status: DC

## 2021-12-12 MED ORDER — ALBUMIN HUMAN 25 % IV SOLN
25.0000 g | Freq: Once | INTRAVENOUS | Status: AC
  Administered 2021-12-12: 25 g via INTRAVENOUS
  Filled 2021-12-12: qty 100

## 2021-12-12 MED ORDER — POTASSIUM CHLORIDE CRYS ER 10 MEQ PO TBCR: 10.0000 meq | EXTENDED_RELEASE_TABLET | Freq: Every day | ORAL | 0 refills | Status: DC

## 2021-12-12 MED ORDER — POTASSIUM CHLORIDE CRYS ER 20 MEQ PO TBCR
40.0000 meq | EXTENDED_RELEASE_TABLET | Freq: Once | ORAL | Status: AC
  Administered 2021-12-12: 40 meq via ORAL
  Filled 2021-12-12: qty 2

## 2021-12-12 MED ORDER — CHLORHEXIDINE GLUCONATE CLOTH 2 % EX PADS
6.0000 | MEDICATED_PAD | Freq: Every day | CUTANEOUS | Status: DC
  Administered 2021-12-12: 6 via TOPICAL

## 2021-12-12 NOTE — Progress Notes (Signed)
Nsg Discharge Note  Admit Date:  12/11/2021 Discharge date: 12/12/2021   Kristen Mack to be D/C'd Home per MD order.  AVS completed.  Copy for chart, and copy for patient signed, and dated. Patient/caregiver able to verbalize understanding.  Discharge Medication: Allergies as of 12/12/2021       Reactions   Ambien [zolpidem Tartrate] Other (See Comments)   Side effect "lost a couple days"; memory loss   Cefzil [cefprozil] Diarrhea   Codeine Itching, Nausea And Vomiting   Demerol [meperidine] Other (See Comments)   "messes up senses"   Formaldehyde    Latex Other (See Comments)   "skin gets raw"   Sulfa Antibiotics Other (See Comments)   Severe abd cramp   Celexa [citalopram Hydrobromide] Other (See Comments)   Mouth sores        Medication List     STOP taking these medications    diltiazem 120 MG 24 hr capsule Commonly known as: CARDIZEM CD   Jardiance 10 MG Tabs tablet Generic drug: empagliflozin   tiZANidine 2 MG tablet Commonly known as: ZANAFLEX   vitamin C 1000 MG tablet       TAKE these medications    albuterol 108 (90 Base) MCG/ACT inhaler Commonly known as: VENTOLIN HFA INHALE 2 PUFFS INTO LUNGS EVERY 6 HOURS AS NEEDED FOR SHORTNESS OF BREATH What changed:  how much to take when to take this reasons to take this   Azelastine HCl 137 MCG/SPRAY Soln USE 2 SPRAY(S) IN EACH NOSTRIL TWICE DAILY What changed: See the new instructions.   B COMPLEX 100 PO Take 1 tablet by mouth daily.   B-12 1000 MCG Subl Place 1,000 mcg under the tongue daily.   blood glucose meter kit and supplies Dispense based on patient and insurance preference. Use to test sugars BID  (FOR ICD-10 E10.9, E11.9).   buPROPion 300 MG 24 hr tablet Commonly known as: WELLBUTRIN XL Take 1 tablet (300 mg total) by mouth daily.   Chromium Picolinate 1000 MCG Tabs Take 1 tablet by mouth daily.   CoQ10 200 MG Caps Take 200 mg by mouth daily.   DIGESTIVE ENZYMES  PO Take 1 tablet by mouth 3 (three) times daily.   FreeStyle Libre 2 Reader Kerrin Mo As directed   [employer] 2 Sensor Misc 1 Piece by Does not apply route every 14 (fourteen) days.   gabapentin 100 MG capsule Commonly known as: NEURONTIN Take 1 capsule (100 mg total) by mouth 2 (two) times daily. What changed:  when to take this reasons to take this   glucose blood test strip Use to check blood glucose four times daily as instructed   ipratropium 0.02 % nebulizer solution Commonly known as: ATROVENT USE ONE VIAL IN NEBULIZER 4 TIMES DAILY AS NEEDED FOR SHORTNESS OF BREATH What changed:  when to take this reasons to take this   Levemir FlexPen 100 UNIT/ML FlexPen Generic drug: insulin detemir INJECT 50 UNITS SUBCUTANEOUSLY EVERYDAY AT BEDTIME What changed: See the new instructions.   levothyroxine 125 MCG tablet Commonly known as: SYNTHROID TAKE 1 TABLET BY MOUTH ONCE DAILY BEFORE BREAKFAST   Lysine HCl 500 MG Tabs Take 500 mg by mouth daily.   Magnesium 250 MG Tabs Take 250 mg by mouth 2 (two) times daily.   meclizine 25 MG tablet Commonly known as: ANTIVERT Take 25 mg by mouth 3 (three) times daily as needed for dizziness.   montelukast 10 MG tablet Commonly known as: SINGULAIR TAKE 1 TABLET BY  MOUTH AT BEDTIME   OVER THE COUNTER MEDICATION Take 1 Scoop by mouth 3 (three) times daily with meals. Fennel seeds   OVER THE COUNTER MEDICATION Choline and inisotol   potassium chloride 10 MEQ tablet Commonly known as: KLOR-CON M Take 1 tablet (10 mEq total) by mouth daily.   rosuvastatin 5 MG tablet Commonly known as: CRESTOR TAKE 1 TABLET BY MOUTH ON MONDAY, WEDNESDAY,  FRIDAY AND SATURDAY   TART CHERRY PO Take 1 tablet by mouth 3 (three) times daily.   Vitamin A 3 MG (10000 UT) Tabs Take 10,000 Units by mouth daily.   vitamin E 180 MG (400 UNITS) capsule Take 800 Units by mouth daily.        Discharge Assessment: Vitals:   12/12/21 1151  12/12/21 1300  BP: (!) 155/98   Pulse: 80   Resp: 18 (!) 21  Temp:    SpO2:     Skin clean, dry and intact without evidence of skin break down, no evidence of skin tears noted. IV catheter discontinued intact. Site without signs and symptoms of complications - no redness or edema noted at insertion site, patient denies c/o pain - only slight tenderness at site.  Dressing with slight pressure applied.  D/c Instructions-Education: Discharge instructions given to patient/family with verbalized understanding. D/c education completed with patient/family including follow up instructions, medication list, d/c activities limitations if indicated, with other d/c instructions as indicated by MD - patient able to verbalize understanding, all questions fully answered. Patient instructed to return to ED, call 911, or call MD for any changes in condition.  Patient escorted via [place], and D/C home via private auto.  Carney Corners, RN 12/12/2021 2:19 PM

## 2021-12-12 NOTE — Discharge Summary (Signed)
Physician Discharge Summary   Patient: Kristen Mack MRN: [id number] DOB: [birth year]  Admit date:     12/11/2021  Discharge date: 12/12/21  Discharge Physician: Deatra James   PCP: Kathyrn Drown, MD   Recommendations at discharge:   Follow-up with cardiologist next week regarding holding your diltiazem due to bradycardia on this admission Recommended close follow-up with PCP, modification of current polypharmacy, multiple supplement vitamins  Discharge Diagnoses: Principal Problem:   Symptomatic bradycardia Active Problems:   GERD (gastroesophageal reflux disease)   IBS (irritable bowel syndrome)   Morbid obesity (HCC)   HTN (hypertension), benign   NASH (nonalcoholic steatohepatitis)   Sleep apnea   Type 2 DM with CKD stage 3 and hypertension (Kristen Mack)   Hypokalemia  Resolved Problems:   * No resolved hospital problems. *  Hospital Course: Kristen Mack  is a 76 y.o. 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, as well she does report diarrhea, which has been intermittent, but has worsened much recently, as well she does report mild dyspnea, it did respond to albuterol inhaler, reports she is more dyspneic this morning, she felt lightheaded, reports 3 bowel movements already today, mix between soft and watery, she denies any chest pain, cough, fever or chills, patient is retired [employer] scientist, 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 then 50s, Potassium was significantly low at 2.7, Cr; at 1.41, baseline 1.5-1.7, QTc was prolonged , chest x-ray was with no  active disease,   EKG: Rhythm NSR, PVC, rate  64 /min, QTc 558   ----------------------------------------------------------------------------------------------------------------------------------------------------------------------------------- Symptomatic bradycardia / With Weakness/lightheadedness -Improved symptoms  -Presents with lightheadedness, generalized weakness, she was noted with heart rate in the 30s in ED. -Discontinued Cardizem CD due to bradycardia -Discontinue Zanaflex , she is known to cause severe bradycardia as well -Heart rate has improved > 60s  -Check  Free T3/ Free T4 >> 0.87  - TSH; elevated at 7.41  -Echocardiogram: Reviewed, normal ejection fraction, negative for any acute abnormalities EJF: 60 to 65%  -Recommending follow-up with cardiologist as an outpatient  -Corrected underlying hypokalemia  PVCs -Evaluated by cardiology, as an outpatient, PVC burden 22% -For now we will hold her Cardizem due to bradycardia. -Keep potassium>4 and magnesium> 2   Hypertension, uncontrolled -On Dinamap blood pressure 209/92, manually 145/95    Mixed hyperlipidemia Continue with Crestor   Prolonged QTc -Electrolyte such as hypokalemia has been addressed and repleted -Repeating EKG   Obesity, morbid, BMI 40.0-49.9 ([place]) -Outpatient follow-up with PCP, regarding aggressive weight loss, healthier diet and exercise was discussed with patient    Chronic diastolic CHF - Resumed on as needed Lasix once her nausea and electrolyte improves.   Diabetes mellitus, type II, insulin-dependent  -Patient reports she stopped taking Jardiance last month -A1c: 7.3 -Continue with home dose Levemir -We will add insulin sliding scale   Diarrhea -Very likely related to IBS, but this is more severe than her baseline, will check C. difficile and GI panel, and if negative would start Lomotil (avoid Imodium due to  prolonged QTc.   Depression -Resume Wellbutrin (was on hold due to  hypokalemia)      Consultants: none  Procedures performed: 2D echocardiogram Disposition: Home Diet recommendation:  Discharge Diet Orders (From admission, onward)     Start     Ordered   12/12/21 0000  Diet - low sodium heart healthy        12/12/21 1055           Cardiac diet DISCHARGE MEDICATION: Allergies as of 12/12/2021       Reactions   Ambien [zolpidem Tartrate] Other (See Comments)   Side effect "lost a couple days"; memory loss   Cefzil [cefprozil] Diarrhea   Codeine Itching, Nausea And Vomiting   Demerol [meperidine] Other (See Comments)   "messes up senses"   Formaldehyde    Latex Other (See Comments)   "skin gets raw"   Sulfa Antibiotics Other (See Comments)   Severe abd cramp   Celexa [citalopram Hydrobromide] Other (See Comments)   Mouth sores        Medication List     STOP taking these medications    diltiazem 120 MG 24 hr capsule Commonly known as: CARDIZEM CD   Jardiance 10 MG Tabs tablet Generic drug: empagliflozin   tiZANidine 2 MG tablet Commonly known as: ZANAFLEX   vitamin C 1000 MG tablet       TAKE these medications    albuterol 108 (90 Base) MCG/ACT inhaler Commonly known as: VENTOLIN HFA INHALE 2 PUFFS INTO LUNGS EVERY 6 HOURS AS NEEDED FOR SHORTNESS OF BREATH What changed:  how much to take when to take this reasons to take this   Azelastine HCl 137 MCG/SPRAY Soln USE 2 SPRAY(S) IN EACH NOSTRIL TWICE DAILY What changed: See the new instructions.   B COMPLEX 100 PO Take 1 tablet by mouth daily.   B-12 1000 MCG Subl Place 1,000 mcg under the tongue daily.   blood glucose meter kit and supplies Dispense based on patient and insurance preference. Use to test sugars BID  (FOR ICD-10 E10.9, E11.9).   buPROPion 300 MG 24 hr tablet Commonly known as: WELLBUTRIN XL Take 1 tablet (300 mg total) by mouth daily.   Chromium Picolinate 1000 MCG Tabs Take 1 tablet by mouth daily.   CoQ10 200 MG Caps Take 200 mg by  mouth daily.   DIGESTIVE ENZYMES PO Take 1 tablet by mouth 3 (three) times daily.   FreeStyle Libre 2 Reader Kerrin Mo As directed   [employer] 2 Sensor Misc 1 Piece by Does not apply route every 14 (fourteen) days.   gabapentin 100 MG capsule Commonly known as: NEURONTIN Take 1 capsule (100 mg total) by mouth 2 (two) times daily. What changed:  when to take this reasons to take this   glucose blood test strip Use to check blood glucose four times daily as instructed   ipratropium 0.02 % nebulizer solution Commonly known as: ATROVENT USE ONE VIAL IN NEBULIZER 4 TIMES DAILY AS NEEDED FOR SHORTNESS OF BREATH What changed:  when to take this reasons to take this   Levemir FlexPen 100 UNIT/ML FlexPen Generic drug: insulin detemir INJECT 50 UNITS SUBCUTANEOUSLY EVERYDAY AT BEDTIME What changed: See the new instructions.   levothyroxine 125 MCG tablet Commonly known as: SYNTHROID TAKE 1 TABLET BY MOUTH ONCE DAILY BEFORE BREAKFAST   Lysine HCl 500 MG Tabs Take 500 mg by mouth daily.   Magnesium 250 MG Tabs Take 250 mg by mouth 2 (two) times daily.  meclizine 25 MG tablet Commonly known as: ANTIVERT Take 25 mg by mouth 3 (three) times daily as needed for dizziness.   montelukast 10 MG tablet Commonly known as: SINGULAIR TAKE 1 TABLET BY MOUTH AT BEDTIME   OVER THE COUNTER MEDICATION Take 1 Scoop by mouth 3 (three) times daily with meals. Fennel seeds   OVER THE COUNTER MEDICATION Choline and inisotol   potassium chloride 10 MEQ tablet Commonly known as: KLOR-CON M Take 1 tablet (10 mEq total) by mouth daily.   rosuvastatin 5 MG tablet Commonly known as: CRESTOR TAKE 1 TABLET BY MOUTH ON MONDAY, WEDNESDAY,  FRIDAY AND SATURDAY   TART CHERRY PO Take 1 tablet by mouth 3 (three) times daily.   Vitamin A 3 MG (10000 UT) Tabs Take 10,000 Units by mouth daily.   vitamin E 180 MG (400 UNITS) capsule Take 800 Units by mouth daily.        Discharge  Exam: Filed Weights   12/11/21 1136  Weight: 98 kg      Physical Exam:   General:  AAO x 3,  cooperative, no distress;   HEENT:  Normocephalic, PERRL, otherwise with in Normal limits   Neuro:  CNII-XII intact. , normal motor and sensation, reflexes intact   Lungs:   Clear to auscultation BL, Respirations unlabored,  No wheezes / crackles  Cardio:    S1/S2, RRR, No murmure, No Rubs or Gallops   Abdomen:  Soft, non-tender, bowel sounds active all four quadrants, no guarding or peritoneal signs.  Muscular  skeletal:  Limited exam -global generalized weaknesses - in bed, able to move all 4 extremities,   2+ pulses,  symmetric, No pitting edema  Skin:  Dry, warm to touch, negative for any Rashes,  Wounds: Please see nursing documentation          Condition at discharge: good  The results of significant diagnostics from this hospitalization (including imaging, microbiology, ancillary and laboratory) are listed below for reference.   Imaging Studies: ECHOCARDIOGRAM COMPLETE  Result Date: 12/12/2021    ECHOCARDIOGRAM REPORT   Patient Name:   SHANNIE KONTOS Date of Exam: 12/12/2021 Medical Rec #:  [id number]            Height:       59.0 in Accession #:    [id number]           Weight:       216.0 lb Date of Birth:  1945/04/10            BSA:          1.906 m Patient Age:    31 years             BP:           209/92 mmHg Patient Gender: F                    HR:           74 bpm. Exam Location:  [facility] Procedure: 2D Echo, Cardiac Doppler and Color Doppler Indications:    Abnormal ECG, Dyspnea  History:        Patient has prior history of Echocardiogram examinations, most                 recent 02/11/2020. Risk Factors:Hypertension, Diabetes and                 Dyslipidemia.  Sonographer:    Wenda Low Referring Phys: [id number] Fransisco Hertz  STRADER  Sonographer Comments: Patient is obese. IMPRESSIONS  1. Left ventricular ejection fraction, by estimation, is 60 to 65%. The left  ventricle has normal function. The left ventricle has no regional wall motion abnormalities. There is moderate asymmetric left ventricular hypertrophy of the basal segment. Left ventricular diastolic parameters are consistent with Grade II diastolic dysfunction (pseudonormalization).  2. Right ventricular systolic function is normal. The right ventricular size is normal. There is normal pulmonary artery systolic pressure. The estimated right ventricular systolic pressure is 31.5 mmHg.  3. Left atrial size was mild to moderately dilated.  4. The mitral valve is grossly normal. Mild mitral valve regurgitation.  5. The aortic valve is tricuspid. Aortic valve regurgitation is not visualized. Aortic valve sclerosis/calcification is present, without any evidence of aortic stenosis. Aortic valve mean gradient measures 10.7 mmHg.  6. Aortic dilatation noted. There is mild dilatation of the ascending aorta, measuring 37 mm.  7. The inferior vena cava is normal in size with greater than 50% respiratory variability, suggesting right atrial pressure of 3 mmHg. Comparison(s): Prior images reviewed side by side. LVEF remains normal range at 60-65%. FINDINGS  Left Ventricle: Left ventricular ejection fraction, by estimation, is 60 to 65%. The left ventricle has normal function. The left ventricle has no regional wall motion abnormalities. The left ventricular internal cavity size was normal in size. There is  moderate asymmetric left ventricular hypertrophy of the basal segment. Left ventricular diastolic parameters are consistent with Grade II diastolic dysfunction (pseudonormalization). Right Ventricle: The right ventricular size is normal. No increase in right ventricular wall thickness. Right ventricular systolic function is normal. There is normal pulmonary artery systolic pressure. The tricuspid regurgitant velocity is 2.10 m/s, and  with an assumed right atrial pressure of 3 mmHg, the estimated right ventricular systolic  pressure is 40.0 mmHg. Left Atrium: Left atrial size was mild to moderately dilated. Right Atrium: Right atrial size was normal in size. Pericardium: There is no evidence of pericardial effusion. Mitral Valve: The mitral valve is grossly normal. There is mild calcification of the mitral valve leaflet(s). Mild mitral annular calcification. Mild mitral valve regurgitation. MV peak gradient, 4.2 mmHg. The mean mitral valve gradient is 2.0 mmHg. Tricuspid Valve: The tricuspid valve is grossly normal. Tricuspid valve regurgitation is trivial. Aortic Valve: The aortic valve is tricuspid. There is mild aortic valve annular calcification. Aortic valve regurgitation is not visualized. Aortic valve sclerosis/calcification is present, without any evidence of aortic stenosis. Aortic valve mean gradient measures 10.7 mmHg. Aortic valve peak gradient measures 21.7 mmHg. Aortic valve area, by VTI measures 1.93 cm. Pulmonic Valve: The pulmonic valve was grossly normal. Pulmonic valve regurgitation is trivial. Aorta: The aortic root is normal in size and structure and aortic dilatation noted. There is mild dilatation of the ascending aorta, measuring 37 mm. Venous: The inferior vena cava is normal in size with greater than 50% respiratory variability, suggesting right atrial pressure of 3 mmHg. IAS/Shunts: No atrial level shunt detected by color flow Doppler.  LEFT VENTRICLE PLAX 2D LVIDd:         5.00 cm   Diastology LVIDs:         3.30 cm   LV e' medial:    5.77 cm/s LV PW:         1.30 cm   LV E/e' medial:  16.7 LV IVS:        1.50 cm   LV e' lateral:   6.42 cm/s LVOT diam:     1.90  cm   LV E/e' lateral: 15.0 LV SV:         106 LV SV Index:   56 LVOT Area:     2.84 cm  RIGHT VENTRICLE RV Basal diam:  3.80 cm RV Mid diam:    3.10 cm RV S prime:     16.30 cm/s TAPSE (M-mode): 3.1 cm LEFT ATRIUM             Index        RIGHT ATRIUM           Index LA diam:        4.10 cm 2.15 cm/m   RA Area:     16.90 cm LA Vol (A2C):   65.5 ml  34.37 ml/m  RA Volume:   47.90 ml  25.14 ml/m LA Vol (A4C):   75.8 ml 39.78 ml/m LA Biplane Vol: 70.3 ml 36.89 ml/m  AORTIC VALVE                     PULMONIC VALVE AV Area (Vmax):    1.78 cm      PV Vmax:       1.05 m/s AV Area (Vmean):   1.93 cm      PV Peak grad:  4.4 mmHg AV Area (VTI):     1.93 cm AV Vmax:           232.67 cm/s AV Vmean:          148.000 cm/s AV VTI:            0.549 m AV Peak Grad:      21.7 mmHg AV Mean Grad:      10.7 mmHg LVOT Vmax:         146.00 cm/s LVOT Vmean:        101.000 cm/s LVOT VTI:          0.374 m LVOT/AV VTI ratio: 0.68  AORTA Ao Root diam: 3.00 cm Ao Asc diam:  3.70 cm MITRAL VALVE               TRICUSPID VALVE MV Area (PHT): 3.76 cm    TR Peak grad:   17.6 mmHg MV Area VTI:   2.82 cm    TR Vmax:        210.00 cm/s MV Peak grad:  4.2 mmHg MV Mean grad:  2.0 mmHg    SHUNTS MV Vmax:       1.03 m/s    Systemic VTI:  0.37 m MV Vmean:      65.5 cm/s   Systemic Diam: 1.90 cm MV Decel Time: 202 msec MV E velocity: 96.40 cm/s MV A velocity: 90.00 cm/s MV E/A ratio:  1.07 Rozann Lesches MD Electronically signed by Rozann Lesches MD Signature Date/Time: 12/12/2021/1:23:18 PM    Final    DG Chest 2 View  Result Date: 12/11/2021 CLINICAL DATA:  Dyspnea, leg swelling EXAM: CHEST - 2 VIEW COMPARISON:  11/06/2019 FINDINGS: The heart size and mediastinal contours are within normal limits. Both lungs are clear. The visualized skeletal structures are unremarkable. IMPRESSION: No active cardiopulmonary disease. Electronically Signed   By: Kathreen Devoid M.D.   On: 12/11/2021 13:40   US Venous Img Lower Unilateral Right  Result Date: 12/11/2021 CLINICAL DATA:  Right lower extremity pain and edema. EXAM: RIGHT LOWER EXTREMITY VENOUS DOPPLER ULTRASOUND TECHNIQUE: Gray-scale sonography with graded compression, as well as color Doppler and duplex ultrasound were performed to evaluate the lower extremity  deep venous systems from the level of the common femoral vein and including the  common femoral, femoral, profunda femoral, popliteal and calf veins including the posterior tibial, peroneal and gastrocnemius veins when visible. The superficial great saphenous vein was also interrogated. Spectral Doppler was utilized to evaluate flow at rest and with distal augmentation maneuvers in the common femoral, femoral and popliteal veins. COMPARISON:  10/02/2021 FINDINGS: Contralateral Common Femoral Vein: Respiratory phasicity is normal and symmetric with the symptomatic side. No evidence of thrombus. Normal compressibility. Common Femoral Vein: No evidence of thrombus. Normal compressibility, respiratory phasicity and response to augmentation. Saphenofemoral Junction: No evidence of thrombus. Normal compressibility and flow on color Doppler imaging. Profunda Femoral Vein: No evidence of thrombus. Normal compressibility and flow on color Doppler imaging. Femoral Vein: No evidence of thrombus. Normal compressibility, respiratory phasicity and response to augmentation. Popliteal Vein: No evidence of thrombus. Normal compressibility, respiratory phasicity and response to augmentation. Calf Veins: No evidence of thrombus. Normal compressibility and flow on color Doppler imaging. Superficial Great Saphenous Vein: No evidence of thrombus. Normal compressibility. Venous Reflux:  None. Other Findings: No evidence of superficial thrombophlebitis. Previously visualized small popliteal fossa Baker's cyst is not seen on today's study. IMPRESSION: 1. No evidence of right lower extremity DVT. 2. Previously noted small right popliteal fossa Baker's cyst is not visualized today. Electronically Signed   By: Aletta Edouard M.D.   On: 12/11/2021 13:28    Microbiology: Results for orders placed or performed during the hospital encounter of 12/11/21  SARS Coronavirus 2 by RT PCR (hospital order, performed in [facility] hospital lab) *cepheid single result test* Anterior Nasal Swab     Status: None   Collection Time:  12/11/21  1:05 PM   Specimen: Anterior Nasal Swab  Result Value Ref Range Status   SARS Coronavirus 2 by RT PCR NEGATIVE NEGATIVE Final    Comment: (NOTE) SARS-CoV-2 target nucleic acids are NOT DETECTED.  The SARS-CoV-2 RNA is generally detectable in upper and lower respiratory specimens during the acute phase of infection. The lowest concentration of SARS-CoV-2 viral copies this assay can detect is 250 copies / mL. A negative result does not preclude SARS-CoV-2 infection and should not be used as the sole basis for treatment or other patient management decisions.  A negative result may occur with improper specimen collection / handling, submission of specimen other than nasopharyngeal swab, presence of viral mutation(s) within the areas targeted by this assay, and inadequate number of viral copies (<250 copies / mL). A negative result must be combined with clinical observations, patient history, and epidemiological information.  Fact Sheet for Patients:   https://www.patel.info/  Fact Sheet for Healthcare Providers: https://hall.com/  This test is not yet approved or  cleared by the [place] FDA and has been authorized for detection and/or diagnosis of SARS-CoV-2 by FDA under an Emergency Use Authorization (EUA).  This EUA will remain in effect (meaning this test can be used) for the duration of the COVID-19 declaration under Section 564(b)(1) of the Act, 21 U.S.C. section 360bbb-3(b)(1), unless the authorization is terminated or revoked sooner.  Performed at [facility], [address] [place], [place] [address]     Labs: CBC: Recent Labs  Lab 12/11/21 1417 12/11/21 2131 12/12/21 0405  WBC 9.5 12.0* 10.4  NEUTROABS 7.0  --   --   HGB 12.2 12.9 10.9*  HCT 37.5 39.4 33.9*  MCV 83.3 83.1 83.7  PLT 393 399 564   Basic Metabolic Panel: Recent Labs  Lab 12/11/21 1417 12/11/21 1531 12/11/21 2131 12/12/21 0405  NA  140  --  141 140  K 2.7*  --  3.1* 3.5  CL 109  --  112* 114*  CO2 24  --  22 22  GLUCOSE 152*  --  132* 121*  BUN 23  --  19 19  CREATININE 1.41*  --  1.35* 1.42*  CALCIUM 9.0  --  8.8* 8.3*  MG  --  1.9 2.4  --    Liver Function Tests: Recent Labs  Lab 12/11/21 1417  AST 24  ALT 25  ALKPHOS 73  BILITOT 0.7  PROT 7.4  ALBUMIN 3.2*   CBG: Recent Labs  Lab 12/11/21 1341 12/11/21 1659 12/11/21 2110 12/12/21 0746 12/12/21 1122  GLUCAP 177* 129* 123* 176* 147*    Discharge time spent: greater than 30 minutes.  Signed: Deatra James, MD [facility] 12/12/2021

## 2021-12-12 NOTE — Progress Notes (Signed)
*  PRELIMINARY RESULTS* Echocardiogram 2D Echocardiogram has been performed.  Elpidio Anis 12/12/2021, 8:56 AM

## 2021-12-12 NOTE — Hospital Course (Addendum)
Kristen Mack  is a 76 y.o. 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, as well she does report diarrhea, which has been intermittent, but has worsened much recently, as well she does report mild dyspnea, it did respond to albuterol inhaler, reports she is more dyspneic this morning, she felt lightheaded, reports 3 bowel movements already today, mix between soft and watery, she denies any chest pain, cough, fever or chills, patient is retired [employer] scientist, 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 then 50s, Potassium was significantly low at 2.7, Cr; at 1.41, baseline 1.5-1.7, QTc was prolonged , chest x-ray was with no active disease,   EKG: Rhythm NSR, PVC, rate  64 /min, QTc 558   ----------------------------------------------------------------------------------------------------------------------------------------------------------------------------------- Symptomatic bradycardia / With Weakness/lightheadedness -Improved symptoms  -Presents with lightheadedness, generalized weakness, she was noted with heart rate in the 30s in ED. -Discontinued Cardizem CD due to bradycardia -Discontinue Zanaflex , she is known to cause severe bradycardia as well -Heart rate has improved > 60s  -Check  Free T3/ Free T4 >> 0.87  - TSH; elevated at 7.41  -Echocardiogram: Reviewed, normal ejection fraction, negative for any acute abnormalities EJF: 60 to 65%  -Recommending follow-up with cardiologist as an outpatient  -Corrected underlying hypokalemia  PVCs -Evaluated by cardiology, as an  outpatient, PVC burden 22% -For now we will hold her Cardizem due to bradycardia. -Keep potassium>4 and magnesium> 2   Hypertension, uncontrolled -On Dinamap blood pressure 209/92, manually 145/95    Mixed hyperlipidemia Continue with Crestor   Prolonged QTc -Electrolyte such as hypokalemia has been addressed and repleted -Repeating EKG   Obesity, morbid, BMI 40.0-49.9 ([place]) -Outpatient follow-up with PCP, regarding aggressive weight loss, healthier diet and exercise was discussed with patient    Chronic diastolic CHF - Resumed on as needed Lasix once her nausea and electrolyte improves.   Diabetes mellitus, type II, insulin-dependent  -Patient reports she stopped taking Jardiance last month -A1c: 7.3 -Continue with home dose Levemir -We will add insulin sliding scale   Diarrhea -Very likely related to IBS, but this is more severe than her baseline, will check C. difficile and GI panel, and if negative would start Lomotil (avoid Imodium due to prolonged QTc.   Depression -Resume Wellbutrin (was on hold due to hypokalemia)

## 2021-12-12 NOTE — Progress Notes (Signed)
Pt BP's continue to run elevated when on the automatic machine as patient prefers it on her wrist, therefore not giving the most accurate reading.   Manual BP taken on patient's wrist and obtained a more reasonable reading. Charted in flowsheets. Will make MD aware.

## 2021-12-14 ENCOUNTER — Encounter: Payer: Self-pay | Admitting: *Deleted

## 2021-12-14 ENCOUNTER — Other Ambulatory Visit: Payer: Self-pay

## 2021-12-14 ENCOUNTER — Telehealth: Payer: Self-pay | Admitting: *Deleted

## 2021-12-14 DIAGNOSIS — Z79899 Other long term (current) drug therapy: Secondary | ICD-10-CM

## 2021-12-14 DIAGNOSIS — E876 Hypokalemia: Secondary | ICD-10-CM

## 2021-12-14 NOTE — Telephone Encounter (Signed)
Nurses-please connect with Lillyan to set up transitional care visit. (I am out half of this week with preplanned time off-potentially the appointment would need to be with me on the later this week or with myself next week)

## 2021-12-14 NOTE — Patient Outreach (Signed)
  Care Coordination [facility] Note Transition Care Management Follow-up Telephone Call Date of discharge and from where: 12/12/21 from [facility] How have you been since you were released from the hospital? "Better but weak and tired" Any questions or concerns? No  Items Reviewed: Did the pt receive and understand the discharge instructions provided? Yes  Medications obtained and verified? Yes  Other? Yes Discussed holding diltiazem due to bradycardia until cardiology visit. Discussed Medicare Extra Help for Prescription Drug Coverage. Patient is going to call Social Security office to schedule an appointment or request a paper application. Advised that this can be done online as well.  Any new allergies since your discharge? No  Dietary orders reviewed? Yes Do you have support at home? Yes   Home Care and Equipment/Supplies: Were home health services ordered? no If so, what is the name of the agency? N/a  Has the agency set up a time to come to the patient's home? not applicable Were any new equipment or medical supplies ordered?  No What is the name of the medical supply agency? N/a Were you able to get the supplies/equipment? not applicable Do you have any questions related to the use of the equipment or supplies? No  Functional Questionnaire: (I = Independent and D = Dependent) ADLs: I  Bathing/Dressing- I  Meal Prep- I  Eating- I  Maintaining continence- I  Transferring/Ambulation- I. With cane or walker  Managing Meds- I  Follow up appointments reviewed:  PCP Hospital f/u appt confirmed? No  Staff message sent to [employer] to coordinate appointment. [facility] f/u appt confirmed? Yes  Scheduled to see Coletta Memos, NP on 12/18/21 @ 1:55. Are transportation arrangements needed? No  If their condition worsens, is the pt aware to call PCP or go to the Emergency Dept.? Yes Was the patient provided with contact information for the PCP's office  or ED? Yes Was to pt encouraged to call back with questions or concerns? Yes  SDOH assessments and interventions completed:   Yes  Care Coordination Interventions Activated:  Yes   Care Coordination Interventions:  PCP follow up appointment requested Provided education on Medicare Extra Help for Prescriptions    Encounter Outcome:  Pt. Visit Completed    Chong Sicilian, BSN, RN-BC RN Care Coordinator [place]: [phone] Main #: [phone]

## 2021-12-14 NOTE — Addendum Note (Signed)
Addended by: Betha Loa F on: 12/14/2021 11:41 AM   Modules accepted: Orders

## 2021-12-15 LAB — T3, FREE: T3, Free: 2.3 pg/mL (ref 2.0–4.4)

## 2021-12-16 ENCOUNTER — Telehealth: Payer: Self-pay | Admitting: Cardiovascular Disease

## 2021-12-16 NOTE — Telephone Encounter (Signed)
Spoke with patient. Explained that MD recommended spironolactone but would need to see BMET, med review first. She has a visit on 9/22 with Denyse Amass NP. Advised would be discussed this. She voiced understanding.

## 2021-12-16 NOTE — Progress Notes (Addendum)
Cardiology Clinic Note   Patient Name: Kristen Mack Date of Encounter: 12/18/2021  Primary Care Provider:  Kathyrn Drown, MD Primary Cardiologist:  Kristen Field, MD  Patient Profile    Kristen Mack 76 year old female presents the clinic today for follow-up evaluation of her hypertension.  Past Medical History    Past Medical History:  Diagnosis Date   ADHD (attention deficit hyperactivity disorder) 2007   Adrenal adenoma, left    followed by Kristen Mack   Arthritis    Asthma    followed by pcp   CFS (chronic fatigue syndrome)    CKD (chronic kidney disease), stage III [facility])    nephrologist---  Kristen Mack   Depression    Edema of both lower extremities    Fatty liver disease, nonalcoholic    Fibromyalgia    GERD (gastroesophageal reflux disease)    Hiatal hernia    History of adenomatous polyp of colon    History of cardiac murmur    until age 28- pt has had echocardiograms   History of TIA (transient ischemic attack)    per CT scan    Hyperlipidemia    Hypertension    Hypothyroidism    endocrinologist-- Kristen Mack   IBS (irritable bowel syndrome)    Meniere disease    OSA on CPAP    per pt uses nightly   Osteopenia    Pancreas cyst    followed by pcp   PMB (postmenopausal bleeding)    PVC's (premature ventricular contractions)    cardiologist--- Kristen Mack   Type 2 diabetes mellitus treated with insulin [facility])    endocrinologist-- Kristen Mack;    (08-14-2021  per pt checks blood sugar QID ,  fasting average --- 73-150)   Urinary incontinence    Wears glasses    Wears partial dentures    upper   Past Surgical History:  Procedure Laterality Date   CERVICAL POLYPECTOMY N/A 08/17/2021   Procedure: BIOPSY OF ENDOMETRIUM AND OR POLYPECTOMY;  Surgeon: Kristen Kava, MD;  Location: [place];  Service: Gynecology;  Laterality: N/A;   [place]   and  REPAIR UMBILICAL HERNIA    COLONOSCOPY N/A 04/19/2013   Procedure: COLONOSCOPY;  Surgeon: Kristen Houston, MD;  [facility];  Service: Endoscopy;  Laterality: N/A;     DILITATION & CURRETTAGE/HYSTROSCOPY WITH NOVASURE ABLATION N/A 08/17/2021   Procedure: DILATATION & CURETTAGE/HYSTEROSCOPY;  Surgeon: Kristen Kava, MD;  Location: [place];  Service: Gynecology;  Laterality: N/A;   ESOPHAGOGASTRODUODENOSCOPY  02/08/2003   HERNIA REPAIR  1995   HYSTEROSCOPY WITH D & C  11/14/2002   endometrial polypectomy   HYSTEROSCOPY WITH D & C N/A 04/05/2014   Procedure: DILATATION AND CURETTAGE /HYSTEROSCOPY, ENDOMETRIAL POLYPECTOMY;  Surgeon: Kristen Kava, MD;  Location: [place];  Service: Gynecology;  Laterality: N/A;   LIVER BIOPSY  01/29/2004   benign   TUBAL LIGATION  1979    Allergies  Allergies  Allergen Reactions   Ambien [Zolpidem Tartrate] Other (See Comments)    Side effect "lost a couple days"; memory loss   Cefzil [Cefprozil] Diarrhea   Codeine Itching and Nausea And Vomiting   Demerol [Meperidine] Other (See Comments)    "messes up senses"   Formaldehyde    Latex Other (See Comments)    "skin gets raw"   Sulfa Antibiotics Other (See Comments)  Severe abd cramp   Celexa [Citalopram Hydrobromide] Other (See Comments)    Mouth sores    History of Present Illness    Kristen Mack 76 year old female presents the clinic today for medication review and BMP.  We are anticipating starting spironolactone.  She has a PMH of palpitations (22% PVC burden, cardiac event monitor 11/21), hypothyroidism, hypertension, type 2 diabetes, OSA, CKD stage III, and prior CVA.  She was seen on 12/11/2021 during hospital admission by Kristen Mack.  She has been seen and evaluated for her bradycardia.  She had previously been seen by Kristen. Audie Mack 1/23.  She was being evaluated for palpitations and her significant PVC burden.  She was continued on Cardizem 120 mg daily at that  time.  During her hospital exam she denied dyspnea.  She did report new nausea and diarrhea.  She was having multiple loose bowel movements.  She was concerned that this was related to her SGLT 2 I and stop the medication.  Her nausea had resolved.  Her daughters were present and noted that she had worsening lower extremity swelling right greater than left which has not improved despite her adopting a low-salt diet.  She was noted to be sinus rhythm with frequent PVCs.  She presents to the clinic today for follow-up evaluation and states she has not been checking her blood pressure at home.  Today in the clinic we were able to check her blood pressure one time.  It was 170/92.  When trying to reevaluate her blood pressure she was unable to tolerate a blood pressure cuff.  She did not allow subsequent blood pressures.  Case discussed with DOD.  We reviewed her most recent lab work.  I will start her on spironolactone 12.5 mg daily.  We will repeat a BMP in 1 week, have her take her blood pressure cuff to her PCP for calibration/verification, and plan follow-up in 3 months.  Today she denies chest pain, shortness of breath, lower extremity edema, fatigue, palpitations, melena, hematuria, hemoptysis, diaphoresis, weakness, presyncope, syncope, orthopnea, and PND.   Home Medications    Prior to Admission medications   Medication Sig Start Date End Date Taking? Authorizing Provider  albuterol (VENTOLIN HFA) 108 (90 Base) MCG/ACT inhaler INHALE 2 PUFFS INTO LUNGS EVERY 6 HOURS AS NEEDED FOR SHORTNESS OF BREATH Patient taking differently: 2 puffs every 6 (six) hours as needed. INHALE 2 PUFFS INTO LUNGS EVERY 6 HOURS AS NEEDED FOR SHORTNESS OF BREATH 10/07/20   Kristen Drown, MD  Azelastine HCl 137 MCG/SPRAY SOLN USE 2 SPRAY(S) IN EACH NOSTRIL TWICE DAILY Patient taking differently: Place 2 sprays into the nose 2 (two) times daily. 08/27/20   Kristen Drown, MD  B Complex Vitamins (B COMPLEX 100 PO) Take 1  tablet by mouth daily.    [provider]  blood glucose meter kit and supplies Dispense based on patient and insurance preference. Use to test sugars BID  (FOR ICD-10 E10.9, E11.9). 07/17/21   Kristen Drown, MD  buPROPion (WELLBUTRIN XL) 300 MG 24 hr tablet Take 1 tablet (300 mg total) by mouth daily. Patient taking differently: Take 300 mg by mouth daily. 10/07/20   Kristen Drown, MD  Chromium Picolinate 1000 MCG TABS Take 1 tablet by mouth daily.    [provider]  Coenzyme Q10 (COQ10) 200 MG CAPS Take 200 mg by mouth daily.    [provider]  Continuous Blood Gluc Receiver (FREESTYLE LIBRE 2 READER) DEVI As directed 08/10/21  Cassandria Anger, MD  Continuous Blood Gluc Sensor (FREESTYLE LIBRE 2 SENSOR) MISC 1 Piece by Does not apply route every 14 (fourteen) days. 08/10/21   Cassandria Anger, MD  Cyanocobalamin (B-12) 1000 MCG SUBL Place 1,000 mcg under the tongue daily.    [provider]  DIGESTIVE ENZYMES PO Take 1 tablet by mouth 3 (three) times daily.     [provider]  gabapentin (NEURONTIN) 100 MG capsule Take 1 capsule (100 mg total) by mouth 2 (two) times daily. 12/12/21 01/11/22  Shahmehdi, Erling Conte A, MD  glucose blood test strip Use to check blood glucose four times daily as instructed 09/03/21   Cassandria Anger, MD  ipratropium (ATROVENT) 0.02 % nebulizer solution USE ONE VIAL IN NEBULIZER 4 TIMES DAILY AS NEEDED FOR SHORTNESS OF BREATH Patient taking differently: every 4 (four) hours as needed. USE ONE VIAL IN NEBULIZER 4 TIMES DAILY AS NEEDED FOR SHORTNESS OF BREATH 02/18/18   Luking, Scott A, MD  LEVEMIR FLEXPEN 100 UNIT/ML FlexPen INJECT 50 UNITS SUBCUTANEOUSLY EVERYDAY AT BEDTIME Patient taking differently: 50 Units at bedtime. 11/12/21   Cassandria Anger, MD  levothyroxine (SYNTHROID) 125 MCG tablet TAKE 1 TABLET BY MOUTH ONCE DAILY BEFORE BREAKFAST 10/30/21   Cassandria Anger, MD  Lysine HCl 500 MG TABS Take  500 mg by mouth daily.    [provider]  Magnesium 250 MG TABS Take 250 mg by mouth 2 (two) times daily.    [provider]  meclizine (ANTIVERT) 25 MG tablet Take 25 mg by mouth 3 (three) times daily as needed for dizziness.    [provider]  montelukast (SINGULAIR) 10 MG tablet TAKE 1 TABLET BY MOUTH AT BEDTIME 10/30/21   Luking, Elayne Snare, MD  OVER THE COUNTER MEDICATION Take 1 Scoop by mouth 3 (three) times daily with meals. Fennel seeds    [provider]  OVER THE COUNTER MEDICATION Choline and inisotol    [provider]  potassium chloride (KLOR-CON M) 10 MEQ tablet Take 1 tablet (10 mEq total) by mouth daily. 12/12/21 01/11/22  Shahmehdi, Valeria Batman, MD  rosuvastatin (CRESTOR) 5 MG tablet TAKE 1 TABLET BY MOUTH ON MONDAY, WEDNESDAY,  FRIDAY AND SATURDAY 09/03/21   Luking, Elayne Snare, MD  TART CHERRY PO Take 1 tablet by mouth 3 (three) times daily.    [provider]  Vitamin A 3 MG (10000 UT) TABS Take 10,000 Units by mouth daily.    [provider]  vitamin E 180 MG (400 UNITS) capsule Take 800 Units by mouth daily.     [provider]    Family History    Family History  Problem Relation Age of Onset   Rectal cancer Brother 33   Diabetes Brother    Ovarian cancer Mother 41   Hypertension Mother    Diabetes Mother    Cancer Mother    Depression Mother    Early death Mother    Obesity Mother    Hypertension Father    Diabetes Father    Depression Father    Hyperlipidemia Father    Stroke Father    Breast cancer Maternal Aunt    Breast cancer Paternal Aunt        four Paternal aunts   Heart disease Paternal Grandfather    Hyperlipidemia Paternal Grandfather    Hypertension Paternal Grandfather    Stroke Paternal Grandfather    Liver disease Sister        x 2;  fatty liver   Hearing loss Maternal Grandfather    Hypertension Maternal Grandfather    Heart disease Maternal Grandmother    Hypertension Maternal  Grandmother    Obesity Maternal Grandmother    Obesity Paternal Grandmother    ADD / ADHD Daughter    Depression Daughter    ADD / ADHD Daughter    Depression Daughter    Alcohol abuse Maternal Uncle    Depression Maternal Uncle    Drug abuse Maternal Uncle    Early death Maternal Uncle    Alcohol abuse Paternal Uncle    Depression Paternal Uncle    Drug abuse Paternal Uncle    Anxiety disorder Sister    Depression Sister    Hyperlipidemia Sister    Miscarriages / Stillbirths Sister    Cancer Brother    Depression Brother    Early death Brother    Cancer Maternal Aunt    Cancer Maternal Aunt    Cancer Paternal Aunt    Cancer Paternal Aunt    Cancer Paternal Aunt    Cancer Paternal Aunt    Drug abuse Brother    Heart disease Maternal Uncle    Hyperlipidemia Maternal Uncle    Hypertension Maternal Uncle    Hyperlipidemia Sister    Hyperlipidemia Brother    Hyperlipidemia Brother    Hypertension Maternal Uncle    Pancreatic cancer Neg Hx    Esophageal cancer Neg Hx    She indicated that the status of her mother is unknown. She indicated that the status of her father is unknown. She indicated that one of her five brothers is deceased. She indicated that the status of her maternal grandmother is unknown. She indicated that the status of her maternal grandfather is unknown. She indicated that the status of her paternal grandmother is unknown. She indicated that the status of her paternal grandfather is unknown. She indicated that the status of her paternal uncle is unknown. She indicated that the status of her neg hx is unknown.  Social History    Social History   Socioeconomic History   Marital status: Married    Spouse name: Herbie Baltimore   Number of children: 2   Years of education: Not on file   Highest education level: Not on file  Occupational History   Occupation: retired, Therapist, sports  Tobacco Use   Smoking status: Former    Packs/day: 1.50    Years: 3.00    Total pack years:  4.50    Types: Cigarettes    Quit date: 10/03/1968    Years since quitting: 53.2   Smokeless tobacco: Never  Vaping Use   Vaping Use: Never used  Substance and Sexual Activity   Alcohol use: No   Drug use: No   Sexual activity: Not Currently    Birth control/protection: None  Other Topics Concern   Not on file  Social History Narrative   2 daughters, Amy and Sharyn Lull. Amy lives with parents. Sharyn Lull lives in [place].   Social Determinants of Health   Financial Resource Strain: Low Risk  (12/14/2021)   Overall Financial Resource Strain (CARDIA)    Difficulty of Paying Living Expenses: Not hard at all  Food Insecurity: No Food Insecurity (12/14/2021)   Hunger Vital Sign    Worried About Running Out of Food in the Last Year: Never true    Ran Out of Food in the Last Year: Never true  Transportation Needs: No Transportation Needs (12/14/2021)   PRAPARE - Transportation  Lack of Transportation (Medical): No    Lack of Transportation (Non-Medical): No  Physical Activity: Insufficiently Active (12/30/2020)   Exercise Vital Sign    Days of Exercise per Week: 3 days    Minutes of Exercise per Session: 20 min  Stress: No Stress Concern Present (12/30/2020)   [place]    Feeling of Stress : Not at all  Social Connections: [place] (12/30/2020)   Social Connection and Isolation Panel [NHANES]    Frequency of Communication with Friends and Family: More than three times a week    Frequency of Social Gatherings with Friends and Family: More than three times a week    Attends Religious Services: 1 to 4 times per year    Active Member of [employer] or Organizations: Yes    Attends Archivist Meetings: 1 to 4 times per year    Marital Status: Married  Human resources officer Violence: Not At Risk (12/11/2021)   Humiliation, Afraid, Rape, and Kick questionnaire    Fear of Current or Ex-Partner: No    Emotionally  Abused: No    Physically Abused: No    Sexually Abused: No     Review of Systems    General:  No chills, fever, night sweats or weight changes.  Cardiovascular:  No chest pain, dyspnea on exertion, edema, orthopnea, palpitations, paroxysmal nocturnal dyspnea. Dermatological: No rash, lesions/masses Respiratory: No cough, dyspnea Urologic: No hematuria, dysuria Abdominal:   No nausea, vomiting, diarrhea, bright red blood per rectum, melena, or hematemesis Neurologic:  No visual changes, wkns, changes in mental status. All other systems reviewed and are otherwise negative except as noted above.  Physical Exam    VS:  BP (!) 170/92 Comment: Unable to verify blood pressure , BMI There is no height or weight on file to calculate BMI. GEN: Well nourished, well developed, in no acute distress. HEENT: normal. Neck: Supple, no JVD, carotid bruits, or masses. Cardiac: RRR, no murmurs, rubs, or gallops. No clubbing, cyanosis, edema.  Radials/DP/PT 2+ and equal bilaterally.  Respiratory:  Respirations regular and unlabored, clear to auscultation bilaterally. GI: Soft, nontender, nondistended, BS + x 4. MS: no deformity or atrophy. Skin: warm and dry, no rash. Neuro:  Strength and sensation are intact. Psych: Normal affect.  Accessory Clinical Findings    Recent Labs: 12/11/2021: ALT 25; B Natriuretic Peptide 380.0; Magnesium 2.4; TSH 7.410 12/12/2021: Hemoglobin 10.9; Platelets 361 12/17/2021: BUN 25; Creatinine, Ser 1.38; Potassium 4.7; Sodium 139   Recent Lipid Panel    Component Value Date/Time   CHOL 191 08/31/2021 1525   TRIG 186 (H) 08/31/2021 1525   HDL 44 08/31/2021 1525   CHOLHDL 4.3 08/31/2021 1525   CHOLHDL 3.3 05/07/2014 1122   VLDL 24 05/07/2014 1122   LDLCALC 114 (H) 08/31/2021 1525    HYPERTENSION CONTROL Vitals:   12/18/21 1407 12/18/21 1501  BP: (!) 170/92 (!) 170/92    The patient's blood pressure is elevated above target today.  In order to address the  patient's elevated BP: Blood pressure will be monitored at home to determine if medication changes need to be made.; A new medication was prescribed today.       ECG personally reviewed by me today-none today.  Echocardiogram 12/12/2021  Sonographer Comments: Patient is obese.  IMPRESSIONS     1. Left ventricular ejection fraction, by estimation, is 60 to 65%. The  left ventricle has normal function. The left ventricle has no regional  wall motion abnormalities. There is moderate asymmetric left ventricular  hypertrophy of the basal segment.  Left ventricular diastolic parameters are consistent with Grade II  diastolic dysfunction (pseudonormalization).   2. Right ventricular systolic function is normal. The right ventricular  size is normal. There is normal pulmonary artery systolic pressure. The  estimated right ventricular systolic pressure is 97.9 mmHg.   3. Left atrial size was mild to moderately dilated.   4. The mitral valve is grossly normal. Mild mitral valve regurgitation.   5. The aortic valve is tricuspid. Aortic valve regurgitation is not  visualized. Aortic valve sclerosis/calcification is present, without any  evidence of aortic stenosis. Aortic valve mean gradient measures 10.7  mmHg.   6. Aortic dilatation noted. There is mild dilatation of the ascending  aorta, measuring 37 mm.   7. The inferior vena cava is normal in size with greater than 50%  respiratory variability, suggesting right atrial pressure of 3 mmHg.   Comparison(s): Prior images reviewed side by side. LVEF remains normal  range at 60-65%.   Assessment & Plan   1.  Essential hypertension-BP [id number].  Unable to repeat blood pressure.  Patient wishes to defer at this time.  She insists on using her home cuff at home.  She will take her cuff to her PCP for calibration.  BMP reviewed Heart healthy low-sodium diet-salty 6 given Increase physical activity as tolerated Order start spironolactone  12.5 mg daily after reviewing results of BMP.  Bradycardia, PVCs-heart rate today 67 BPM.  Denies increased fatigue, lightheadedness, presyncope and syncope. Cardizem placed on hold during hospitalization. Continue to monitor.  Acute HFpEF-euvolemic today.  Resuming normal daily activities.  Echocardiogram 12/12/2021 showed LVEF of 60-65%, G2 DD mild dilation of the ascending aorta measuring 37 mm. Continue hydralazine Heart healthy low-sodium diet-salty 6 given Increase physical activity as tolerated  Disposition: Follow-up with Kristen. Audie Mack in 3-4 months.   Jossie Ng. Takota Cahalan NP-C     12/18/2021, 3:03 PM [facility] [place] [address] Office [phone] Fax [phone]  Notice: This dictation was prepared with Dragon dictation along with smaller phrase technology. Any transcriptional errors that result from this process are unintentional and may not be corrected upon review.  I spent 15 minutes examining this patient, reviewing medications, and using patient centered shared decision making involving her cardiac care.  Prior to her visit I spent greater than 20 minutes reviewing her past medical history,  medications, and prior cardiac tests.

## 2021-12-16 NOTE — Telephone Encounter (Signed)
Pt c/o medication issue:  1. Name of Medication: spironolactone   2. How are you currently taking this medication (dosage and times per day)? Not taking  3. Are you having a reaction (difficulty breathing--STAT)? no  4. What is your medication issue? Patient states she was supposed to have the medication sent to her pharmacy, but they have not received it yet. She would like a call back when it is sent.

## 2021-12-17 DIAGNOSIS — E876 Hypokalemia: Secondary | ICD-10-CM | POA: Diagnosis not present

## 2021-12-17 DIAGNOSIS — Z79899 Other long term (current) drug therapy: Secondary | ICD-10-CM | POA: Diagnosis not present

## 2021-12-18 ENCOUNTER — Encounter: Payer: Self-pay | Admitting: General Practice

## 2021-12-18 ENCOUNTER — Ambulatory Visit: Payer: HMO | Attending: General Practice | Admitting: General Practice

## 2021-12-18 VITALS — BP 170/92

## 2021-12-18 DIAGNOSIS — R001 Bradycardia, unspecified: Secondary | ICD-10-CM | POA: Diagnosis not present

## 2021-12-18 DIAGNOSIS — I10 Essential (primary) hypertension: Secondary | ICD-10-CM

## 2021-12-18 DIAGNOSIS — I5030 Unspecified diastolic (congestive) heart failure: Secondary | ICD-10-CM

## 2021-12-18 LAB — BASIC METABOLIC PANEL
BUN/Creatinine Ratio: 18 (ref 12–28)
BUN: 25 mg/dL (ref 8–27)
CO2: 19 mmol/L — ABNORMAL LOW (ref 20–29)
Calcium: 9.6 mg/dL (ref 8.7–10.3)
Chloride: 106 mmol/L (ref 96–106)
Creatinine, Ser: 1.38 mg/dL — ABNORMAL HIGH (ref 0.57–1.00)
Glucose: 124 mg/dL — ABNORMAL HIGH (ref 70–99)
Potassium: 4.7 mmol/L (ref 3.5–5.2)
Sodium: 139 mmol/L (ref 134–144)
eGFR: 39 mL/min/{1.73_m2} — ABNORMAL LOW (ref >59)

## 2021-12-18 MED ORDER — SPIRONOLACTONE 25 MG PO TABS: 12.5000 mg | ORAL_TABLET | Freq: Every day | ORAL | 3 refills | Status: DC

## 2021-12-18 NOTE — Patient Instructions (Signed)
Medication Instructions:  START SPIRONOLACTONE 12.5MG DAILY (1/2 TAB)  STOP POTASSIUM  *If you need a refill on your cardiac medications before your next appointment, please call your pharmacy*   Lab Work: BMET (THIS IS NOT FASTING) IN 1-2 WEEKS If you have labs (blood work) drawn today and your tests are completely normal, you will receive your results only by: [place] (if you have MyChart) OR A paper copy in the mail If you have any lab test that is abnormal or we need to change your treatment, we will call you to review the results.  Follow-Up: At [facility], you and your health needs are our priority.  As part of our continuing mission to provide you with exceptional heart care, we have created designated Provider Care Teams.  These Care Teams include your primary Cardiologist (physician) and Advanced Practice Providers (APPs -  Physician Assistants and Nurse Practitioners) who all work together to provide you with the care you need, when you need it.  Your next appointment:   3-4 month(s)  The format for your next appointment:   In Person  Provider:   Evalina Field, MD     Other Instructions PLEASE READ AND FOLLOW ATTACHED  SALTY 6  INCREASE PHYSICAL ACTIVITY AS TOLERATED  GET YOUR WRIST CUFF CALIBRATED AT [place] MD  Important Information About Sugar

## 2021-12-22 ENCOUNTER — Encounter: Payer: Self-pay | Admitting: Cardiovascular Disease

## 2021-12-23 ENCOUNTER — Ambulatory Visit (INDEPENDENT_AMBULATORY_CARE_PROVIDER_SITE_OTHER): Payer: HMO | Admitting: Family Medicine

## 2021-12-23 ENCOUNTER — Encounter: Payer: Self-pay | Admitting: Family Medicine

## 2021-12-23 VITALS — BP 132/74 | Wt 218.6 lb

## 2021-12-23 DIAGNOSIS — E876 Hypokalemia: Secondary | ICD-10-CM

## 2021-12-23 DIAGNOSIS — I10 Essential (primary) hypertension: Secondary | ICD-10-CM

## 2021-12-23 DIAGNOSIS — E162 Hypoglycemia, unspecified: Secondary | ICD-10-CM

## 2021-12-23 NOTE — Progress Notes (Signed)
   Subjective:    Patient ID: Kristen Mack, female    DOB: February 22, 1946, 76 y.o.   MRN: [id number]  HPI Pt arrives for follow up on hospital follow up. Pt went to [facility] on 12/11/21 due to heart palpations and nausea. Pulse low and blood pressure "doing whatever it wanted to" per daughter. Pt reports she was weak. Hospital discovered elevated Potassium. Pt was in ICU step down unit.  Hospital records reviewed Labs reviewed Patient relates a lot of fatigue tiredness but denies any shortness of breath or chest pressure Question is answered regarding having fatigue and some shortness of breath with activity Review of Systems     Objective:   Physical Exam  General-in no acute distress Eyes-no discharge Lungs-respiratory rate normal, CTA CV-no murmurs,RRR Extremities skin warm dry no edema Neuro grossly normal Behavior normal, alert  Patient has had some low glycemic readings It is wise for her to go down on her insulin by 2 units if she continues to have low readings she is to go down to more units she may make adjustments every 3 to 4 days notify us if they are having any serious issues and also notify her endocrinologist     Assessment & Plan:  Recent echo looked good but has diastolic dysfunction Also recent hypokalemia Check lab work May need adjustments Blood pressure good today Healthy diet regular activity Has some appointments coming up over the next several months with specialist Recommend follow-up here within 6 weeks

## 2021-12-24 ENCOUNTER — Telehealth: Payer: Self-pay

## 2021-12-24 ENCOUNTER — Encounter: Payer: Self-pay | Admitting: Family Medicine

## 2021-12-24 ENCOUNTER — Encounter: Payer: Self-pay | Admitting: Cardiovascular Disease

## 2021-12-24 LAB — BASIC METABOLIC PANEL
BUN/Creatinine Ratio: 16 (ref 12–28)
BUN: 22 mg/dL (ref 8–27)
CO2: 20 mmol/L (ref 20–29)
Calcium: 9.7 mg/dL (ref 8.7–10.3)
Chloride: 106 mmol/L (ref 96–106)
Creatinine, Ser: 1.41 mg/dL — ABNORMAL HIGH (ref 0.57–1.00)
Glucose: 114 mg/dL — ABNORMAL HIGH (ref 70–99)
Potassium: 4.7 mmol/L (ref 3.5–5.2)
Sodium: 143 mmol/L (ref 134–144)
eGFR: 38 mL/min/{1.73_m2} — ABNORMAL LOW (ref >59)

## 2021-12-24 LAB — MAGNESIUM: Magnesium: 2.2 mg/dL (ref 1.6–2.3)

## 2021-12-24 NOTE — Telephone Encounter (Signed)
Called pt. See chart.

## 2021-12-24 NOTE — Telephone Encounter (Signed)
Called pt in regards to her MyChart message, below:  I looked at the notes from my doctors visit today and have questions.       Under the treatment plan it states to "Continue hydralazine (twice) and Restart Cardizem". BUT I was not told anything about either medicine. I was only told to "start spironolactone".      What do I do?       COPIED FROM THE NOTES FROM OFFICE VISIT TO DR. O'NEAL'S OFFICE: 1.  Essential hypertension-BP [id number].  Unable to repeat blood pressure.  Patient wishes to defer at this time.  She insists on using her home cuff at home.  She will take her cuff to her PCP for calibration.  BMP reviewed Continue hydralazine Heart healthy low-sodium diet-salty 6 given Increase physical activity as tolerated Order start spironolactone 12.5 mg daily after reviewing results of BMP.   Bradycardia, PVCs-heart rate today 67 BPM.  Denies increased fatigue, lightheadedness, presyncope and syncope. Cardizem placed on hold during hospitalization. Restart Cardizem   Acute HFpEF-euvolemic today.  Resuming normal daily activities.  Echocardiogram 12/12/2021 showed LVEF of 60-65%, G2 DD mild dilation of the ascending aorta measuring 37 mm. Continue hydralazine Heart healthy low-sodium diet-salty 6 given Increase physical activity as tolerated   Disposition: Follow-up with Dr. Audie Box in 3-4 months.    Jossie Ng. Cleaver NP-C  Pt states she only had Hydralazine in the hospital. She will restart the Cardizem today. Will get message to [facility] for review.

## 2021-12-25 NOTE — Telephone Encounter (Signed)
Pt informed of providers result & recommendations. Pt verbalized understanding. No further questions . She will continue to monitor BP and will call if BP is out of range or for any questions

## 2021-12-31 ENCOUNTER — Ambulatory Visit: Payer: Self-pay | Admitting: Family Medicine

## 2022-01-05 ENCOUNTER — Ambulatory Visit (INDEPENDENT_AMBULATORY_CARE_PROVIDER_SITE_OTHER): Payer: HMO

## 2022-01-05 VITALS — Wt 218.0 lb

## 2022-01-05 DIAGNOSIS — Z0000 Encounter for general adult medical examination without abnormal findings: Secondary | ICD-10-CM | POA: Diagnosis not present

## 2022-01-05 NOTE — Progress Notes (Signed)
Virtual Visit via Telephone Note  I connected with  Kristen Mack on 01/05/22 at  1:45 PM EDT by telephone and verified that I am speaking with the correct person using two identifiers.  Location: Patient: home Provider: [facility] Persons participating in the virtual visit: patient/Nurse Health Advisor   I discussed the limitations, risks, security and privacy concerns of performing an evaluation and management service by telephone and the availability of in person appointments. The patient expressed understanding and agreed to proceed.  Interactive audio and video telecommunications were attempted between this nurse and patient, however failed, due to patient having technical difficulties OR patient did not have access to video capability.  We continued and completed visit with audio only.  Some vital signs may be absent or patient reported.   [name], LPN  Subjective:   Kristen Mack is a 76 y.o. female who presents for Medicare Annual (Subsequent) preventive examination.  Review of Systems     Cardiac Risk Factors include: advanced age (>92mn, >>41women);diabetes mellitus;hypertension;dyslipidemia;obesity (BMI >30kg/m2);sedentary lifestyle     Objective:    There were no vitals filed for this visit. There is no height or weight on file to calculate BMI.     01/05/2022    1:58 PM 12/11/2021   11:00 PM 12/11/2021   11:39 AM 08/17/2021    1:04 PM 08/06/2021    3:30 PM 07/15/2021   11:07 AM 04/18/2021   12:24 PM  Advanced Directives  Does Patient Have a Medical Advance Directive? No No No No No No No  Would patient like information on creating a medical advance directive? No - Patient declined No - Patient declined  No - Patient declined No - Patient declined No - Patient declined No - Patient declined    Current Medications (verified) Outpatient Encounter Medications as of 01/05/2022  Medication Sig   albuterol (VENTOLIN HFA) 108 (90 Base) MCG/ACT inhaler  INHALE 2 PUFFS INTO LUNGS EVERY 6 HOURS AS NEEDED FOR SHORTNESS OF BREATH (Patient taking differently: 2 puffs every 6 (six) hours as needed. INHALE 2 PUFFS INTO LUNGS EVERY 6 HOURS AS NEEDED FOR SHORTNESS OF BREATH)   Azelastine HCl 137 MCG/SPRAY SOLN USE 2 SPRAY(S) IN EACH NOSTRIL TWICE DAILY (Patient taking differently: Place 2 sprays into the nose 2 (two) times daily.)   blood glucose meter kit and supplies Dispense based on patient and insurance preference. Use to test sugars BID  (FOR ICD-10 E10.9, E11.9).   buPROPion (WELLBUTRIN XL) 300 MG 24 hr tablet Take 1 tablet (300 mg total) by mouth daily. (Patient taking differently: Take 300 mg by mouth daily.)   Chromium Picolinate 1000 MCG TABS Take 1 tablet by mouth daily.   Coenzyme Q10 (COQ10) 200 MG CAPS Take 200 mg by mouth daily.   Continuous Blood Gluc Receiver (FREESTYLE LIBRE 2 READER) DEVI As directed   Continuous Blood Gluc Sensor (FREESTYLE LIBRE 2 SENSOR) MISC 1 Piece by Does not apply route every 14 (fourteen) days.   Cyanocobalamin (B-12) 1000 MCG SUBL Place 1,000 mcg under the tongue daily.   DIGESTIVE ENZYMES PO Take 1 tablet by mouth 3 (three) times daily.    gabapentin (NEURONTIN) 100 MG capsule Take 1 capsule (100 mg total) by mouth 2 (two) times daily.   glucose blood test strip Use to check blood glucose four times daily as instructed   ipratropium (ATROVENT) 0.02 % nebulizer solution USE ONE VIAL IN NEBULIZER 4 TIMES DAILY AS NEEDED FOR SHORTNESS OF BREATH (Patient taking  differently: every 4 (four) hours as needed. USE ONE VIAL IN NEBULIZER 4 TIMES DAILY AS NEEDED FOR SHORTNESS OF BREATH)   LEVEMIR FLEXPEN 100 UNIT/ML FlexPen INJECT 50 UNITS SUBCUTANEOUSLY EVERYDAY AT BEDTIME (Patient taking differently: 50 Units at bedtime.)   levothyroxine (SYNTHROID) 125 MCG tablet TAKE 1 TABLET BY MOUTH ONCE DAILY BEFORE BREAKFAST   Lysine HCl 500 MG TABS Take 500 mg by mouth daily.   Magnesium 250 MG TABS Take 250 mg by mouth 2 (two)  times daily.   meclizine (ANTIVERT) 25 MG tablet Take 25 mg by mouth 3 (three) times daily as needed for dizziness.   montelukast (SINGULAIR) 10 MG tablet TAKE 1 TABLET BY MOUTH AT BEDTIME   OVER THE COUNTER MEDICATION Take 1 Scoop by mouth 3 (three) times daily with meals. Fennel seeds   OVER THE COUNTER MEDICATION Choline and inisotol   rosuvastatin (CRESTOR) 5 MG tablet TAKE 1 TABLET BY MOUTH ON MONDAY, WEDNESDAY,  FRIDAY AND SATURDAY   spironolactone (ALDACTONE) 25 MG tablet Take 0.5 tablets (12.5 mg total) by mouth daily.   TART CHERRY PO Take 1 tablet by mouth 3 (three) times daily.   Vitamin A 3 MG (10000 UT) TABS Take 10,000 Units by mouth daily.   vitamin E 180 MG (400 UNITS) capsule Take 800 Units by mouth daily.    B Complex Vitamins (B COMPLEX 100 PO) Take 1 tablet by mouth daily. (Patient not taking: Reported on 01/05/2022)   Facility-Administered Encounter Medications as of 01/05/2022  Medication   gentamicin (GARAMYCIN) 330 mg in dextrose 5 % 100 mL IVPB    Allergies (verified) Latex, Ambien [zolpidem tartrate], Cefzil [cefprozil], Citalopram, Codeine, Demerol [meperidine], Formaldehyde, Sulfa antibiotics, Zolpidem, and Celexa [citalopram hydrobromide]   History: Past Medical History:  Diagnosis Date   ADHD (attention deficit hyperactivity disorder) 2007   Adrenal adenoma, left    followed by dr nida   Arthritis    Asthma    followed by pcp   CFS (chronic fatigue syndrome)    CKD (chronic kidney disease), stage III [facility])    nephrologist---  dr sanford   Depression    Edema of both lower extremities    Fatty liver disease, nonalcoholic    Fibromyalgia    GERD (gastroesophageal reflux disease)    Hiatal hernia    History of adenomatous polyp of colon    History of cardiac murmur    until age 38- pt has had echocardiograms   History of TIA (transient ischemic attack)    per CT scan    Hyperlipidemia    Hypertension    Hypothyroidism    endocrinologist-- dr g.  nida   IBS (irritable bowel syndrome)    Meniere disease    OSA on CPAP    per pt uses nightly   Osteopenia    Pancreas cyst    followed by pcp   PMB (postmenopausal bleeding)    PVC's (premature ventricular contractions)    cardiologist--- dr Audie Box   Type 2 diabetes mellitus treated with insulin [facility])    endocrinologist-- dr g. nida;    (08-14-2021  per pt checks blood sugar QID ,  fasting average --- 73-150)   Urinary incontinence    Wears glasses    Wears partial dentures    upper   Past Surgical History:  Procedure Laterality Date   CERVICAL POLYPECTOMY N/A 08/17/2021   Procedure: BIOPSY OF ENDOMETRIUM AND OR POLYPECTOMY;  Surgeon: Sanjuana Kava, MD;  Location: [place];  Service: Gynecology;  Laterality: N/A;   [place]   and  REPAIR UMBILICAL HERNIA   COLONOSCOPY N/A 04/19/2013   Procedure: COLONOSCOPY;  Surgeon: Rogene Houston, MD;  [facility];  Service: Endoscopy;  Laterality: N/A;     DILITATION & CURRETTAGE/HYSTROSCOPY WITH NOVASURE ABLATION N/A 08/17/2021   Procedure: DILATATION & CURETTAGE/HYSTEROSCOPY;  Surgeon: Sanjuana Kava, MD;  Location: [place];  Service: Gynecology;  Laterality: N/A;   ESOPHAGOGASTRODUODENOSCOPY  02/08/2003   HERNIA REPAIR  1995   HYSTEROSCOPY WITH D & C  11/14/2002   endometrial polypectomy   HYSTEROSCOPY WITH D & C N/A 04/05/2014   Procedure: DILATATION AND CURETTAGE /HYSTEROSCOPY, ENDOMETRIAL POLYPECTOMY;  Surgeon: Sanjuana Kava, MD;  Location: [place];  Service: Gynecology;  Laterality: N/A;   LIVER BIOPSY  01/29/2004   benign   TUBAL LIGATION  1979   Family History  Problem Relation Age of Onset   Rectal cancer Brother 83   Diabetes Brother    Ovarian cancer Mother 54   Hypertension Mother    Diabetes Mother    Cancer Mother    Depression Mother    Early death Mother    Obesity Mother    Hypertension Father    Diabetes  Father    Depression Father    Hyperlipidemia Father    Stroke Father    Breast cancer Maternal Aunt    Breast cancer Paternal Aunt        four Paternal aunts   Heart disease Paternal Grandfather    Hyperlipidemia Paternal Grandfather    Hypertension Paternal Grandfather    Stroke Paternal Grandfather    Liver disease Sister        x 2; fatty liver   Hearing loss Maternal Grandfather    Hypertension Maternal Grandfather    Heart disease Maternal Grandmother    Hypertension Maternal Grandmother    Obesity Maternal Grandmother    Obesity Paternal Grandmother    ADD / ADHD Daughter    Depression Daughter    ADD / ADHD Daughter    Depression Daughter    Alcohol abuse Maternal Uncle    Depression Maternal Uncle    Drug abuse Maternal Uncle    Early death Maternal Uncle    Alcohol abuse Paternal Uncle    Depression Paternal Uncle    Drug abuse Paternal Uncle    Anxiety disorder Sister    Depression Sister    Hyperlipidemia Sister    Miscarriages / Stillbirths Sister    Cancer Brother    Depression Brother    Early death Brother    Cancer Maternal Aunt    Cancer Maternal Aunt    Cancer Paternal Aunt    Cancer Paternal Aunt    Cancer Paternal Aunt    Cancer Paternal Aunt    Drug abuse Brother    Heart disease Maternal Uncle    Hyperlipidemia Maternal Uncle    Hypertension Maternal Uncle    Hyperlipidemia Sister    Hyperlipidemia Brother    Hyperlipidemia Brother    Hypertension Maternal Uncle    Pancreatic cancer Neg Hx    Esophageal cancer Neg Hx    Social History   Socioeconomic History   Marital status: Married    Spouse name: Herbie Baltimore   Number of children: 2   Years of education: Not on file   Highest education level: Not on file  Occupational History  Occupation: retired, Therapist, sports  Tobacco Use   Smoking status: Former    Packs/day: 1.50    Years: 3.00    Total pack years: 4.50    Types: Cigarettes    Quit date: 10/03/1968    Years since quitting: 53.2    Smokeless tobacco: Never  Vaping Use   Vaping Use: Never used  Substance and Sexual Activity   Alcohol use: No   Drug use: No   Sexual activity: Not Currently    Birth control/protection: None  Other Topics Concern   Not on file  Social History Narrative   2 daughters, Amy and Sharyn Lull. Amy lives with parents. Sharyn Lull lives in [place].   Social Determinants of Health   Financial Resource Strain: Medium Risk (01/05/2022)   Overall Financial Resource Strain (CARDIA)    Difficulty of Paying Living Expenses: Somewhat hard  Food Insecurity: No Food Insecurity (01/05/2022)   Hunger Vital Sign    Worried About Running Out of Food in the Last Year: Never true    Ran Out of Food in the Last Year: Never true  Transportation Needs: No Transportation Needs (01/05/2022)   PRAPARE - Hydrologist (Medical): No    Lack of Transportation (Non-Medical): No  Physical Activity: Inactive (01/05/2022)   Exercise Vital Sign    Days of Exercise per Week: 0 days    Minutes of Exercise per Session: 0 min  Stress: No Stress Concern Present (01/05/2022)   [place]    Feeling of Stress : Only a little  Social Connections: Moderately Isolated (01/05/2022)   Social Connection and Isolation Panel [NHANES]    Frequency of Communication with Friends and Family: More than three times a week    Frequency of Social Gatherings with Friends and Family: Never    Attends Religious Services: Never    Printmaker: No    Attends Music therapist: Never    Marital Status: Married    Tobacco Counseling Counseling given: Not Answered   Clinical Intake:  Pre-visit preparation completed: Yes  Pain : No/denies pain     Nutritional Risks: None Diabetes: Yes CBG done?: No Did pt. bring in CBG monitor from home?: No  How often do you need to have someone help you when  you read instructions, pamphlets, or other written materials from your doctor or pharmacy?: 1 - Never  Diabetic?yes Nutrition Risk Assessment:  Has the patient had any N/V/D within the last 2 months?  Yes  Does the patient have any non-healing wounds?  No  Has the patient had any unintentional weight loss or weight gain?  Yes   Diabetes:  Is the patient diabetic?  Yes  If diabetic, was a CBG obtained today?  No  Did the patient bring in their glucometer from home?  No  How often do you monitor your CBG's? continuous.   Financial Strains and Diabetes Management:  Are you having any financial strains with the device, your supplies or your medication? No .  Does the patient want to be seen by Chronic Care Management for management of their diabetes?  No  Would the patient like to be referred to a Nutritionist or for Diabetic Management?  No   Diabetic Exams:  Diabetic Eye Exam: Completed 05/21/21. Pt has been advised about the importance in completing this exam.  Diabetic Foot Exam: Completed no. Pt has been advised about the importance in completing  this exam.   Interpreter Needed?: No  Information entered by :: Kirke Shaggy, LPN   Activities of Daily Living    01/05/2022    2:01 PM 12/11/2021   11:00 PM  In your present state of health, do you have any difficulty performing the following activities:  Hearing? 0 0  Vision? 0 0  Difficulty concentrating or making decisions? 0 0  Walking or climbing stairs? 1 0  Dressing or bathing? 0 0  Doing errands, shopping? 0 0  Preparing Food and eating ? N   Using the Toilet? N   In the past six months, have you accidently leaked urine? N   Do you have problems with loss of bowel control? N   Managing your Medications? N   Managing your Finances? N   Housekeeping or managing your Housekeeping? N     Patient Care Team: Kathyrn Drown, MD as PCP - General (Family Medicine) O'Neal, Cassie Freer, MD as PCP - Cardiology  (Cardiology)  Indicate any recent Medical Services you may have received from other than [facility] providers in the past year (date may be approximate).     Assessment:   This is a routine wellness examination for [place].  Hearing/Vision screen Hearing Screening - Comments:: No aids Vision Screening - Comments:: Wears glasses- Dr.Shah  Dietary issues and exercise activities discussed: Current Exercise Habits: The patient does not participate in regular exercise at present   Goals Addressed             This Visit's Progress    DIET - EAT MORE FRUITS AND VEGETABLES         Depression Screen    01/05/2022    1:54 PM 07/15/2021   11:29 AM 07/15/2021   11:06 AM 12/30/2020    1:53 PM 10/07/2020    1:25 PM 01/22/2020    3:21 PM 03/06/2018   10:47 AM  PHQ 2/9 Scores  PHQ - 2 Score 0 0 0 0 0 4 2  PHQ- 9 Score 0     10 12    Fall Risk    01/05/2022    1:59 PM 07/15/2021   11:02 AM 12/30/2020    1:56 PM 10/07/2020    1:25 PM 02/13/2020    1:18 PM  Fall Risk   Falls in the past year? 1 1 1  0 0  Number falls in past yr: 1 0 0    Injury with Fall? 1 1 0    Risk for fall due to : History of fall(s)  History of fall(s);Impaired balance/gait;Impaired mobility;Impaired vision Impaired balance/gait;Impaired mobility   Follow up Falls evaluation completed;Falls prevention discussed  Falls prevention discussed Falls evaluation completed     FALL RISK PREVENTION PERTAINING TO THE HOME:  Any stairs in or around the home? Yes  If so, are there any without handrails? No  Home free of loose throw rugs in walkways, pet beds, electrical cords, etc? Yes  Adequate lighting in your home to reduce risk of falls? Yes   ASSISTIVE DEVICES UTILIZED TO PREVENT FALLS:  Life alert? No  Use of a cane, walker or w/c? Yes  Grab bars in the bathroom? Yes  Shower chair or bench in shower? Yes  Elevated toilet seat or a handicapped toilet? No    Cognitive Function:        01/05/2022    2:09 PM  12/30/2020    2:03 PM  6CIT Screen  What Year? 0 points 0 points  What month?  0 points 0 points  What time? 0 points 0 points  Count back from 20 0 points 0 points  Months in reverse 0 points 0 points  Repeat phrase 4 points 0 points  Total Score 4 points 0 points    Immunizations Immunization History  Administered Date(s) Administered   Fluad Quad(high Dose 65+) 01/22/2020, 02/10/2021   Influenza, High Dose Seasonal PF 04/15/2018, 01/13/2019   Influenza,inj,Quad PF,6+ Mos 03/02/2016, 03/03/2017   Influenza-Unspecified 04/15/2018, 01/15/2019, 01/23/2020   Moderna Sars-Covid-2 Vaccination 05/23/2019, 06/19/2019, 03/05/2020   Pneumococcal Conjugate-13 05/22/2014   Pneumococcal Polysaccharide-23 03/03/2017   Tdap 04/18/2020   Zoster Recombinat (Shingrix) 12/08/2018, 04/27/2019    TDAP status: Up to date  Flu Vaccine status: Up to date  Pneumococcal vaccine status: Up to date  Covid-19 vaccine status: Completed vaccines  Qualifies for Shingles Vaccine? Yes   Zostavax completed No   Shingrix Completed?: Yes  Screening Tests Health Maintenance  Topic Date Due   FOOT EXAM  Never done   COVID-19 Vaccine (4 - Moderna risk series) 04/30/2020   Diabetic kidney evaluation - Urine ACR  08/13/2021   INFLUENZA VACCINE  10/27/2021   OPHTHALMOLOGY EXAM  05/21/2022   HEMOGLOBIN A1C  06/11/2022   Diabetic kidney evaluation - GFR measurement  12/24/2022   TETANUS/TDAP  04/18/2030   Pneumonia Vaccine 71+ Years old  Completed   DEXA SCAN  Completed   Hepatitis C Screening  Completed   Zoster Vaccines- Shingrix  Completed   HPV VACCINES  Aged Out   COLONOSCOPY (Pts 45-73yr Insurance coverage will need to be confirmed)  Discontinued    Health Maintenance  Health Maintenance Due  Topic Date Due   FOOT EXAM  Never done   COVID-19 Vaccine (4 - Moderna risk series) 04/30/2020   Diabetic kidney evaluation - Urine ACR  08/13/2021   INFLUENZA VACCINE  10/27/2021    Colorectal  cancer screening: No longer required.   Mammogram status: No longer required due to age.  Bone Density status: Completed 07/02/20. Results reflect: Bone density results: OSTEOPOROSIS. Repeat every 2 years.  Lung Cancer Screening: (Low Dose CT Chest recommended if Age 77-80years, 30 pack-year currently smoking OR have quit w/in 15years.) does not qualify.   Additional Screening:  Hepatitis C Screening: does qualify; Completed 09/19/13  Vision Screening: Recommended annual ophthalmology exams for early detection of glaucoma and other disorders of the eye. Is the patient up to date with their annual eye exam?  Yes  Who is the provider or what is the name of the office in which the patient attends annual eye exams? Dr.Shah If pt is not established with a provider, would they like to be referred to a provider to establish care? No .   Dental Screening: Recommended annual dental exams for proper oral hygiene  Community Resource Referral / Chronic Care Management: CRR required this visit?  No   CCM required this visit?  No      Plan:     I have personally reviewed and noted the following in the patient's chart:   Medical and social history Use of alcohol, tobacco or illicit drugs  Current medications and supplements including opioid prescriptions. Patient is not currently taking opioid prescriptions. Functional ability and status Nutritional status Physical activity Advanced directives List of other physicians Hospitalizations, surgeries, and ER visits in previous 12 months Vitals Screenings to include cognitive, depression, and falls Referrals and appointments  In addition, I have reviewed and discussed with patient certain preventive protocols, quality  metrics, and best practice recommendations. A written personalized care plan for preventive services as well as general preventive health recommendations were provided to patient.     [name], LPN   [id number]   Nurse  Notes: none

## 2022-01-05 NOTE — Patient Instructions (Signed)
[name] , Thank you for taking time to come for your Medicare Wellness Visit. I appreciate your ongoing commitment to your health goals. Please review the following plan we discussed and let me know if I can assist you in the future.   Screening recommendations/referrals: Colonoscopy: aged out Mammogram: aged out Bone Density: 07/02/20 Recommended yearly ophthalmology/optometry visit for glaucoma screening and checkup Recommended yearly dental visit for hygiene and checkup  Vaccinations: Influenza vaccine: 02/10/21 Pneumococcal vaccine: 03/03/17 Tdap vaccine: 04/18/20 Shingles vaccine: Shingrix 12/08/18, 04/27/19   Covid-19:05/23/19, 06/19/19, 03/05/20  Advanced directives: no  Conditions/risks identified: none  Next appointment: Follow up in one year for your annual wellness visit 01/11/23 @ 3 pm by phone   Preventive Care 19 Years and Older, Female Preventive care refers to lifestyle choices and visits with your health care provider that can promote health and wellness. What does preventive care include? A yearly physical exam. This is also called an annual well check. Dental exams once or twice a year. Routine eye exams. Ask your health care provider how often you should have your eyes checked. Personal lifestyle choices, including: Daily care of your teeth and gums. Regular physical activity. Eating a healthy diet. Avoiding tobacco and drug use. Limiting alcohol use. Practicing safe sex. Taking low-dose aspirin every day. Taking vitamin and mineral supplements as recommended by your health care provider. What happens during an annual well check? The services and screenings done by your health care provider during your annual well check will depend on your age, overall health, lifestyle risk factors, and family history of disease. Counseling  Your health care provider may ask you questions about your: Alcohol use. Tobacco use. Drug use. Emotional well-being. Home and  relationship well-being. Sexual activity. Eating habits. History of falls. Memory and ability to understand (cognition). Work and work Statistician. Reproductive health. Screening  You may have the following tests or measurements: Height, weight, and BMI. Blood pressure. Lipid and cholesterol levels. These may be checked every 5 years, or more frequently if you are over 77 years old. Skin check. Lung cancer screening. You may have this screening every year starting at age 93 if you have a 30-pack-year history of smoking and currently smoke or have quit within the past 15 years. Fecal occult blood test (FOBT) of the stool. You may have this test every year starting at age 74. Flexible sigmoidoscopy or colonoscopy. You may have a sigmoidoscopy every 5 years or a colonoscopy every 10 years starting at age 34. Hepatitis C blood test. Hepatitis B blood test. Sexually transmitted disease (STD) testing. Diabetes screening. This is done by checking your blood sugar (glucose) after you have not eaten for a while (fasting). You may have this done every 1-3 years. Bone density scan. This is done to screen for osteoporosis. You may have this done starting at age 59. Mammogram. This may be done every 1-2 years. Talk to your health care provider about how often you should have regular mammograms. Talk with your health care provider about your test results, treatment options, and if necessary, the need for more tests. Vaccines  Your health care provider may recommend certain vaccines, such as: Influenza vaccine. This is recommended every year. Tetanus, diphtheria, and acellular pertussis (Tdap, Td) vaccine. You may need a Td booster every 10 years. Zoster vaccine. You may need this after age 59. Pneumococcal 13-valent conjugate (PCV13) vaccine. One dose is recommended after age 20. Pneumococcal polysaccharide (PPSV23) vaccine. One dose is recommended after age 36. Talk  to your health care provider  about which screenings and vaccines you need and how often you need them. This information is not intended to replace advice given to you by your health care provider. Make sure you discuss any questions you have with your health care provider. Document Released: 04/11/2015 Document Revised: 12/03/2015 Document Reviewed: 01/14/2015 Elsevier Interactive Patient Education  2017 [place] Prevention in the Home Falls can cause injuries. They can happen to people of all ages. There are many things you can do to make your home safe and to help prevent falls. What can I do on the outside of my home? Regularly fix the edges of walkways and driveways and fix any cracks. Remove anything that might make you trip as you walk through a door, such as a raised step or threshold. Trim any bushes or trees on the path to your home. Use bright outdoor lighting. Clear any walking paths of anything that might make someone trip, such as rocks or tools. Regularly check to see if handrails are loose or broken. Make sure that both sides of any steps have handrails. Any raised decks and porches should have guardrails on the edges. Have any leaves, snow, or ice cleared regularly. Use sand or salt on walking paths during winter. Clean up any spills in your garage right away. This includes oil or grease spills. What can I do in the bathroom? Use night lights. Install grab bars by the toilet and in the tub and shower. Do not use towel bars as grab bars. Use non-skid mats or decals in the tub or shower. If you need to sit down in the shower, use a plastic, non-slip stool. Keep the floor dry. Clean up any water that spills on the floor as soon as it happens. Remove soap buildup in the tub or shower regularly. Attach bath mats securely with double-sided non-slip rug tape. Do not have throw rugs and other things on the floor that can make you trip. What can I do in the bedroom? Use night lights. Make sure  that you have a light by your bed that is easy to reach. Do not use any sheets or blankets that are too big for your bed. They should not hang down onto the floor. Have a firm chair that has side arms. You can use this for support while you get dressed. Do not have throw rugs and other things on the floor that can make you trip. What can I do in the kitchen? Clean up any spills right away. Avoid walking on wet floors. Keep items that you use a lot in easy-to-reach places. If you need to reach something above you, use a strong step stool that has a grab bar. Keep electrical cords out of the way. Do not use floor polish or wax that makes floors slippery. If you must use wax, use non-skid floor wax. Do not have throw rugs and other things on the floor that can make you trip. What can I do with my stairs? Do not leave any items on the stairs. Make sure that there are handrails on both sides of the stairs and use them. Fix handrails that are broken or loose. Make sure that handrails are as long as the stairways. Check any carpeting to make sure that it is firmly attached to the stairs. Fix any carpet that is loose or worn. Avoid having throw rugs at the top or bottom of the stairs. If you do have throw rugs,  attach them to the floor with carpet tape. Make sure that you have a light switch at the top of the stairs and the bottom of the stairs. If you do not have them, ask someone to add them for you. What else can I do to help prevent falls? Wear shoes that: Do not have high heels. Have rubber bottoms. Are comfortable and fit you well. Are closed at the toe. Do not wear sandals. If you use a stepladder: Make sure that it is fully opened. Do not climb a closed stepladder. Make sure that both sides of the stepladder are locked into place. Ask someone to hold it for you, if possible. Clearly mark and make sure that you can see: Any grab bars or handrails. First and last steps. Where the edge of  each step is. Use tools that help you move around (mobility aids) if they are needed. These include: Canes. Walkers. Scooters. Crutches. Turn on the lights when you go into a dark area. Replace any light bulbs as soon as they burn out. Set up your furniture so you have a clear path. Avoid moving your furniture around. If any of your floors are uneven, fix them. If there are any pets around you, be aware of where they are. Review your medicines with your doctor. Some medicines can make you feel dizzy. This can increase your chance of falling. Ask your doctor what other things that you can do to help prevent falls. This information is not intended to replace advice given to you by your health care provider. Make sure you discuss any questions you have with your health care provider. Document Released: 01/09/2009 Document Revised: 08/21/2015 Document Reviewed: 04/19/2014 Elsevier Interactive Patient Education  2017 [employer].

## 2022-01-20 ENCOUNTER — Ambulatory Visit: Payer: HMO | Admitting: Family Medicine

## 2022-01-22 ENCOUNTER — Encounter: Payer: Self-pay | Admitting: Family Medicine

## 2022-01-22 ENCOUNTER — Ambulatory Visit (INDEPENDENT_AMBULATORY_CARE_PROVIDER_SITE_OTHER): Payer: HMO | Admitting: Family Medicine

## 2022-01-22 ENCOUNTER — Ambulatory Visit: Payer: HMO | Admitting: Family Medicine

## 2022-01-22 VITALS — BP 136/74 | Wt 218.2 lb

## 2022-01-22 DIAGNOSIS — I10 Essential (primary) hypertension: Secondary | ICD-10-CM

## 2022-01-22 DIAGNOSIS — M25561 Pain in right knee: Secondary | ICD-10-CM | POA: Diagnosis not present

## 2022-01-22 DIAGNOSIS — E876 Hypokalemia: Secondary | ICD-10-CM

## 2022-01-22 DIAGNOSIS — Z23 Encounter for immunization: Secondary | ICD-10-CM

## 2022-01-22 MED ORDER — ROSUVASTATIN CALCIUM 5 MG PO TABS: ORAL_TABLET | ORAL | 5 refills | Status: DC

## 2022-01-22 MED ORDER — TIZANIDINE HCL 2 MG PO CAPS: ORAL_CAPSULE | ORAL | 4 refills | Status: DC

## 2022-01-22 MED ORDER — BUPROPION HCL ER (XL) 300 MG PO TB24: 300.0000 mg | ORAL_TABLET | Freq: Every day | ORAL | 1 refills | Status: DC

## 2022-01-22 NOTE — Progress Notes (Signed)
   Subjective:    Patient ID: Kristen Mack, female    DOB: [birth year], 76 y.o.   MRN: [id number]  HPI Pt arrives for follow up. Pt states things "are going".  Diarrhea and loose stools have started again.  Patient relates she is just not feeling good She states she is feeling worse than she did several weeks ago She did see cardiology they put her on spironolactone she was hoping that that would help with her swelling I talked to her about how spironolactone is more so for  Right knee pain-possible arthritis; would like referral.   She is not interested in doing injection but she would like to get orthopedics opinion and possible advice for brace  Review of Systems     Objective:   Physical Exam General-in no acute distress Eyes-no discharge Lungs-respiratory rate normal, CTA CV-no murmurs,RRR Extremities skin warm dry no edema Neuro grossly normal Behavior normal, alert  No significant edema in the lower legs      Assessment & Plan:  Very nice patient 1. Acute pain of right knee Referral to orthopedics patient does not want any injections but she is willing to consider a brace  May need x-rays - Ambulatory referral to Orthopedics  2. Need for vaccination Today - Flu Vaccine QUAD High Dose(Fluad)  Has blood work being done which we will relook at kidney function and potassium

## 2022-01-23 LAB — TSH: TSH: 2.98 u[IU]/mL (ref 0.450–4.500)

## 2022-01-23 LAB — BASIC METABOLIC PANEL
BUN/Creatinine Ratio: 15 (ref 12–28)
BUN: 21 mg/dL (ref 8–27)
CO2: 20 mmol/L (ref 20–29)
Calcium: 9.4 mg/dL (ref 8.7–10.3)
Chloride: 107 mmol/L — ABNORMAL HIGH (ref 96–106)
Creatinine, Ser: 1.44 mg/dL — ABNORMAL HIGH (ref 0.57–1.00)
Glucose: 115 mg/dL — ABNORMAL HIGH (ref 70–99)
Potassium: 3.6 mmol/L (ref 3.5–5.2)
Sodium: 143 mmol/L (ref 134–144)
eGFR: 37 mL/min/{1.73_m2} — ABNORMAL LOW (ref >59)

## 2022-01-23 LAB — T4, FREE: Free T4: 1.01 ng/dL (ref 0.82–1.77)

## 2022-01-24 NOTE — Progress Notes (Signed)
Kidney functions are stable on her recent met 7 potassium stable.  Recommend repeating metabolic 7 and magnesium in 3 weeks keep follow-up visit in January

## 2022-01-25 NOTE — Progress Notes (Signed)
Lab orders placed and mailed to patient with note to have labs done in 3 weeks.

## 2022-01-25 NOTE — Addendum Note (Signed)
Addended by: Vicente Males on: 01/25/2022 11:49 AM   Modules accepted: Orders

## 2022-01-28 ENCOUNTER — Ambulatory Visit (INDEPENDENT_AMBULATORY_CARE_PROVIDER_SITE_OTHER): Payer: HMO | Admitting: Internal Medicine

## 2022-01-28 ENCOUNTER — Encounter: Payer: Self-pay | Admitting: Internal Medicine

## 2022-01-28 VITALS — BP 120/74 | HR 71 | Temp 98.1°F | Ht 59.0 in | Wt 220.2 lb

## 2022-01-28 DIAGNOSIS — R0609 Other forms of dyspnea: Secondary | ICD-10-CM | POA: Diagnosis not present

## 2022-01-28 DIAGNOSIS — R0602 Shortness of breath: Secondary | ICD-10-CM

## 2022-01-28 LAB — POCT EXHALED NITRIC OXIDE: FeNO level (ppb): 6

## 2022-01-28 MED ORDER — ALBUTEROL SULFATE (2.5 MG/3ML) 0.083% IN NEBU: 2.5000 mg | INHALATION_SOLUTION | Freq: Four times a day (QID) | RESPIRATORY_TRACT | 12 refills | Status: AC | PRN

## 2022-01-28 NOTE — Progress Notes (Signed)
Kristen Mack, female    DOB: 06/17/1945   MRN: [id number]   Brief patient profile:  40  yowf quit 09/1968  referred to pulmonary clinic 01/28/2022 by Dr Sallee Lange  for doe around late 40's at baseline wt 160 and gradually worse requirin increased saba but worried about effects of pred on eyes so declines ICS     History of Present Illness  01/28/2022  Pulmonary/ 1st office eval/Kristen Mack  Chief Complaint  Patient presents with   Consult    Pt was referred by Dr Nicki Reaper b/c she has been having SOB at night and using her Albuterol inhaler more.   Dyspnea:  very sedentary due to R knee / no longer grocery shopping Cough: first thing am assoc with pnds  Sleep: hob up maybe 30 degrees/ on cpap per [place]  Onset x 6 months sleeping poorly ? Why even on with new machine x 2 years  SABA use: thinks it helps but hasn't rechallenged or prechallenged and technique is very poor     Past Medical History:  Diagnosis Date   ADHD (attention deficit hyperactivity disorder) 2007   Adrenal adenoma, left    followed by dr nida   Arthritis    Asthma    followed by pcp   CFS (chronic fatigue syndrome)    CKD (chronic kidney disease), stage III [facility])    nephrologist---  dr Joelyn Oms   Depression    Edema of both lower extremities    Fatty liver disease, nonalcoholic    Fibromyalgia    GERD (gastroesophageal reflux disease)    Hiatal hernia    History of adenomatous polyp of colon    History of cardiac murmur    until age 41- pt has had echocardiograms   History of TIA (transient ischemic attack)    per CT scan    Hyperlipidemia    Hypertension    Hypothyroidism    endocrinologist-- dr g. nida   IBS (irritable bowel syndrome)    Meniere disease    OSA on CPAP    per pt uses nightly   Osteopenia    Pancreas cyst    followed by pcp   PMB (postmenopausal bleeding)    PVC's (premature ventricular contractions)    cardiologist--- dr Audie Box   Type 2 diabetes mellitus treated with insulin  [facility])    endocrinologist-- dr g. nida;    (08-14-2021  per pt checks blood sugar QID ,  fasting average --- 73-150)   Urinary incontinence    Wears glasses    Wears partial dentures    upper    Outpatient Medications Prior to Visit  Medication Sig Dispense Refill   albuterol (VENTOLIN HFA) 108 (90 Base) MCG/ACT inhaler INHALE 2 PUFFS INTO LUNGS EVERY 6 HOURS AS NEEDED FOR SHORTNESS OF BREATH (Patient taking differently: 2 puffs every 6 (six) hours as needed. INHALE 2 PUFFS INTO LUNGS EVERY 6 HOURS AS NEEDED FOR SHORTNESS OF BREATH) 27 g 5   Azelastine HCl 137 MCG/SPRAY SOLN USE 2 SPRAY(S) IN EACH NOSTRIL TWICE DAILY (Patient taking differently: Place 2 sprays into the nose 2 (two) times daily.) 30 mL 5   B Complex Vitamins (B COMPLEX 100 PO) Take 1 tablet by mouth daily.     blood glucose meter kit and supplies Dispense based on patient and insurance preference. Use to test sugars BID  (FOR ICD-10 E10.9, E11.9). 1 each 0   buPROPion (WELLBUTRIN XL) 300 MG 24 hr tablet Take 1 tablet (300  mg total) by mouth daily. 90 tablet 1   Chromium Picolinate 1000 MCG TABS Take 1 tablet by mouth daily.     Coenzyme Q10 (COQ10) 200 MG CAPS Take 200 mg by mouth daily.     Continuous Blood Gluc Receiver (FREESTYLE LIBRE 2 READER) DEVI As directed 1 each 0   Continuous Blood Gluc Sensor (FREESTYLE LIBRE 2 SENSOR) MISC 1 Piece by Does not apply route every 14 (fourteen) days. 2 each 3   Cyanocobalamin (B-12) 1000 MCG SUBL Place 1,000 mcg under the tongue daily.     DIGESTIVE ENZYMES PO Take 1 tablet by mouth 3 (three) times daily.      gabapentin (NEURONTIN) 100 MG capsule Take 1 capsule (100 mg total) by mouth 2 (two) times daily. 60 capsule 0   glucose blood test strip Use to check blood glucose four times daily as instructed 100 each 2   ipratropium (ATROVENT) 0.02 % nebulizer solution USE ONE VIAL IN NEBULIZER 4 TIMES DAILY AS NEEDED FOR SHORTNESS OF BREATH (Patient taking differently: every 4 (four) hours  as needed. USE ONE VIAL IN NEBULIZER 4 TIMES DAILY AS NEEDED FOR SHORTNESS OF BREATH) 1 mL 6   LEVEMIR FLEXPEN 100 UNIT/ML FlexPen INJECT 50 UNITS SUBCUTANEOUSLY EVERYDAY AT BEDTIME (Patient taking differently: 50 Units at bedtime.) 45 mL 0   levothyroxine (SYNTHROID) 125 MCG tablet TAKE 1 TABLET BY MOUTH ONCE DAILY BEFORE BREAKFAST 90 tablet 0   Lysine HCl 500 MG TABS Take 500 mg by mouth daily.     Magnesium 250 MG TABS Take 250 mg by mouth 2 (two) times daily.     meclizine (ANTIVERT) 25 MG tablet Take 25 mg by mouth 3 (three) times daily as needed for dizziness.     montelukast (SINGULAIR) 10 MG tablet TAKE 1 TABLET BY MOUTH AT BEDTIME 90 tablet 0   OVER THE COUNTER MEDICATION Take 1 Scoop by mouth 3 (three) times daily with meals. Fennel seeds     OVER THE COUNTER MEDICATION Choline and inisotol     rosuvastatin (CRESTOR) 5 MG tablet TAKE 1 TABLET BY MOUTH ON MONDAY, WEDNESDAY,  FRIDAY AND SATURDAY 18 tablet 5   spironolactone (ALDACTONE) 25 MG tablet Take 0.5 tablets (12.5 mg total) by mouth daily. 30 tablet 3   TART CHERRY PO Take 1 tablet by mouth 3 (three) times daily.     tizanidine (ZANAFLEX) 2 MG capsule 1 qhs prn caution drowsiness 30 capsule 4   Vitamin A 3 MG (10000 UT) TABS Take 10,000 Units by mouth daily.     vitamin E 180 MG (400 UNITS) capsule Take 800 Units by mouth daily.      Facility-Administered Medications Prior to Visit  Medication Dose Route Frequency Provider Last Rate Last Admin   gentamicin (GARAMYCIN) 330 mg in dextrose 5 % 100 mL IVPB  330 mg Intravenous Q24H Karren Cobble, RPH   330 mg at 08/17/21 1442     Objective:     BP 120/74 (BP Location: Left Arm, Patient Position: Sitting, Cuff Size: Normal)   Pulse 71   Temp 98.1 F (36.7 C) (Oral)   Ht _0  (1.499 m)   Wt 220 lb 3.2 oz (99.9 kg)   SpO2 97% Comment: On RA  BMI 44.48 kg/m   SpO2: 97 % (On RA)  Amb obese wf nad wearing a fadora    HEENT : Oropharynx  clear     Nasal turbinates  nl    NECK :  without  apparent JVD/ palpable Nodes/TM    LUNGS: no acc muscle use,  Nl contour chest which is clear to A and P bilaterally without cough on insp or exp maneuvers   CV:  RRR  no s3 or murmur or increase in P2, and  trace bilateral LE  edema   ABD: quite obese but  soft and nontender    MS:   ext warm without deformities Or obvious joint restrictions  calf tenderness, cyanosis or clubbing    SKIN: warm and dry without lesions    NEURO:  alert, approp, nl sensorium with  no motor or cerebellar deficits apparent.    I personally reviewed images and agree with radiology impression as follows:  CXR:   12/11/21 No active cardiopulmonary disease.      Assessment   DOE (dyspnea on exertion) Onset late 40's at wt around 160 and 220 at initial pulm eval 01/28/2022  - Echo 12/12/21   1. Left ventricular ejection fraction, by estimation, is 60 to 65%. There is moderate asymmetric left ventricular hypertrophy of the basal segment.   G 2 diastolic dysfunction   2. Right ventricular systolic function is normal. The right ventricular  size is normal. There is normal pulmonary artery systolic pressure. The  estimated right ventricular systolic pressure is 75.8 mmHg.   3. Left atrial size was mild to moderately dilated.   4. The mitral valve is grossly normal. Mild mitral valve regurgitation.   5. The aortic valve is tricuspid. Aortic valve regurgitation is not  visualized. Aortic valve sclerosis/calcification is present, without any  evidence of aortic stenosis. Aortic valve mean gradient measures 10.7  mmHg.   6. Aortic dilatation noted. There is mild dilatation of the ascending  aorta, measuring 37 mm.   7. The inferior vena cava is normal in size with greater than 50%  respiratory variability, suggesting right atrial pressure of 3 mmHg.  - Spirometry 01/28/2022  FEV1 1.31 (82%)  Ratio 0.77 s curvature or prior rx  - 01/28/2022  After extensive coaching inhaler device,   effectiveness =    25% from a baseline of zero -  FENO  01/28/2022   = 6  Pattern is one of progressive obesity /deconditioning now complicated by difficulty walking due to djd and  OSA not doing well on cpap > referred to sleep medicine and requested download.  She likely does have significant diastolic dysfunction as she has LAE and risk for cardiac asthma but no evidence at all or primary airways dz other than reported response to saba (which may help cardiac asthma for sure but obvious the latter is being  addressed my other means)   Re saba: Re SABA :  I spent extra time with pt today reviewing appropriate use of albuterol for prn use on exertion with the following points: 1) saba is for relief of sob that does not improve by walking a slower pace or resting but rather if the pt does not improve after trying this first. 2) If the pt is convinced, as many are, that saba helps recover from activity faster then it's easy to tell if this is the case by re-challenging : ie stop, take the inhaler, then p 5 minutes try the exact same activity (intensity of workload) that just caused the symptoms and see if they are substantially diminished or not after saba 3) if there is an activity that reproducibly causes the symptoms, try the saba 15 min before the activity on alternate days  If in fact the saba really does help, then fine to continue to use it prn but advised may need to look closer at the maintenance regimen being used to achieve better control of airways disease with exertion.  If she really does benefit from saba used correctly there would be now problem with trial of low dose symbicort 2 puffs just in the morning assuming she's not exerting in the evening. The bioeqivalency for taking this for a few months is maybe one predisone tablet/advised  Pulmonary f/u in [place] is prn and I will arrange for sleep medicine to eval                Morbid obesity ([place]) Body mass index is 44.48  kg/m.  -    Lab Results  Component Value Date   TSH 2.980 01/22/2022      Contributing to doe and osa and diastolic dysfunction  and risk of GERD/dvt/PE  >>>   reviewed the need and the process to achieve and maintain neg calorie balance > defer f/u primary care including intermittently monitoring thyroid status     Medical decision making was a moderate level of complexity in this case because of  two chronic conditions /diagnoses requiring extra time for  H and P, chart review, counseling, and generating customized AVS unique to this new pt office visit and same day charting.   Each maintenance medication was reviewed in detail including emphasizing most importantly the difference between maintenance and prns and under what circumstances the prns are to be triggered using an action plan format where appropriate. Please see avs for details which were reviewed in writing by both me and my nurse and patient given a written copy highlighted where appropriate with yellow highlighter for the patient's continued care at home along with an updated version of their medications.  Patient was asked to maintain medication reconciliation by comparing this list to the actual medications being used at home and to contact this office right away if there is a conflict or discrepancy.               Christinia Gully, MD 01/28/2022

## 2022-01-28 NOTE — Patient Instructions (Addendum)
Only use your albuterol as a rescue medication to be used if you can't catch your breath by resting or doing a relaxed purse lip breathing pattern.  - The less you use it, the better it will work when you need it. - Ok to use up to 2 puffs  every 4 hours if you must but call for immediate appointment if use goes up over your usual need - Don't leave home without it !!  (think of it like the spare tire for your car)    Also  Ok to try albuterol 15 min before an activity (on alternating days)  that you know would usually make you short of breath and see if it makes any difference and if makes none then don't take albuterol after activity unless you can't catch your breath as this means it's the resting that helps, not the albuterol.  GERD (REFLUX)  is an extremely common cause of respiratory symptoms just like yours , many times with no obvious heartburn at all.    It can be treated with medication, but also with lifestyle changes including elevation of the head of your bed (ideally with 6 -8inch blocks under the headboard of your bed),  Smoking cessation, avoidance of late meals, excessive alcohol, and avoid fatty foods, chocolate, peppermint, colas, red wine, and acidic juices such as orange juice.  NO MINT OR MENTHOL PRODUCTS SO NO COUGH DROPS  USE SUGARLESS CANDY INSTEAD (Jolley ranchers or Stover's or Life Savers) or even ice chips will also do - the key is to swallow to prevent all throat clearing. NO OIL BASED VITAMINS - use powdered substitutes.  Avoid fish oil when coughing.    Next available appt for [place] Dr Elsworth Soho or Halford Chessman for a sleep consult and we will try to get a download in the meantime > requested

## 2022-01-29 ENCOUNTER — Encounter: Payer: Self-pay | Admitting: Internal Medicine

## 2022-01-29 DIAGNOSIS — R0609 Other forms of dyspnea: Secondary | ICD-10-CM | POA: Insufficient documentation

## 2022-01-29 NOTE — Assessment & Plan Note (Addendum)
Body mass index is 44.48 kg/m.  -    Lab Results  Component Value Date   TSH 2.980 01/22/2022      Contributing to doe and osa and diastolic dysfunction  and risk of GERD/dvt/PE  >>>   reviewed the need and the process to achieve and maintain neg calorie balance > defer f/u primary care including intermittently monitoring thyroid status     Medical decision making was a moderate level of complexity in this case because of  two chronic conditions /diagnoses requiring extra time for  H and P, chart review, counseling, and generating customized AVS unique to this new pt office visit and same day charting.   Each maintenance medication was reviewed in detail including emphasizing most importantly the difference between maintenance and prns and under what circumstances the prns are to be triggered using an action plan format where appropriate. Please see avs for details which were reviewed in writing by both me and my nurse and patient given a written copy highlighted where appropriate with yellow highlighter for the patient's continued care at home along with an updated version of their medications.  Patient was asked to maintain medication reconciliation by comparing this list to the actual medications being used at home and to contact this office right away if there is a conflict or discrepancy.

## 2022-01-29 NOTE — Assessment & Plan Note (Addendum)
Onset late 40's at wt around 160 and 220 at initial pulm eval 01/28/2022  - Echo 12/12/21   1. Left ventricular ejection fraction, by estimation, is 60 to 65%. There is moderate asymmetric left ventricular hypertrophy of the basal segment.   G 2 diastolic dysfunction   2. Right ventricular systolic function is normal. The right ventricular  size is normal. There is normal pulmonary artery systolic pressure. The  estimated right ventricular systolic pressure is 22.2 mmHg.   3. Left atrial size was mild to moderately dilated.   4. The mitral valve is grossly normal. Mild mitral valve regurgitation.   5. The aortic valve is tricuspid. Aortic valve regurgitation is not  visualized. Aortic valve sclerosis/calcification is present, without any  evidence of aortic stenosis. Aortic valve mean gradient measures 10.7  mmHg.   6. Aortic dilatation noted. There is mild dilatation of the ascending  aorta, measuring 37 mm.   7. The inferior vena cava is normal in size with greater than 50%  respiratory variability, suggesting right atrial pressure of 3 mmHg.  - Spirometry 01/28/2022  FEV1 1.31 (82%)  Ratio 0.77 s curvature or prior rx  - 01/28/2022  After extensive coaching inhaler device,  effectiveness =    25% from a baseline of zero -  FENO  01/28/2022   = 6  Pattern is one of progressive obesity /deconditioning now complicated by difficulty walking due to djd and  OSA not doing well on cpap > referred to sleep medicine and requested download.  She likely does have significant diastolic dysfunction as she has LAE and risk for cardiac asthma but no evidence at all or primary airways dz other than reported response to saba (which may help cardiac asthma for sure but obvious the latter is being  addressed my other means)   Re saba: Re SABA :  I spent extra time with pt today reviewing appropriate use of albuterol for prn use on exertion with the following points: 1) saba is for relief of sob that does not  improve by walking a slower pace or resting but rather if the pt does not improve after trying this first. 2) If the pt is convinced, as many are, that saba helps recover from activity faster then it's easy to tell if this is the case by re-challenging : ie stop, take the inhaler, then p 5 minutes try the exact same activity (intensity of workload) that just caused the symptoms and see if they are substantially diminished or not after saba 3) if there is an activity that reproducibly causes the symptoms, try the saba 15 min before the activity on alternate days   If in fact the saba really does help, then fine to continue to use it prn but advised may need to look closer at the maintenance regimen being used to achieve better control of airways disease with exertion.  If she really does benefit from saba used correctly there would be now problem with trial of low dose symbicort 2 puffs just in the morning assuming she's not exerting in the evening. The bioeqivalency for taking this for a few months is maybe one predisone tablet/advised  Pulmonary f/u in [place] is prn and I will arrange for sleep medicine to eval

## 2022-01-29 NOTE — Telephone Encounter (Signed)
Need download from cpap machine

## 2022-02-09 ENCOUNTER — Other Ambulatory Visit: Payer: Self-pay | Admitting: "Endocrinology

## 2022-02-09 DIAGNOSIS — N183 Chronic kidney disease, stage 3 unspecified: Secondary | ICD-10-CM

## 2022-02-10 LAB — BASIC METABOLIC PANEL
BUN/Creatinine Ratio: 16 (ref 12–28)
BUN: 22 mg/dL (ref 8–27)
CO2: 22 mmol/L (ref 20–29)
Calcium: 9.7 mg/dL (ref 8.7–10.3)
Chloride: 105 mmol/L (ref 96–106)
Creatinine, Ser: 1.38 mg/dL — ABNORMAL HIGH (ref 0.57–1.00)
Glucose: 142 mg/dL — ABNORMAL HIGH (ref 70–99)
Potassium: 3.6 mmol/L (ref 3.5–5.2)
Sodium: 143 mmol/L (ref 134–144)
eGFR: 39 mL/min/{1.73_m2} — ABNORMAL LOW (ref >59)

## 2022-02-10 LAB — MAGNESIUM: Magnesium: 1.9 mg/dL (ref 1.6–2.3)

## 2022-02-16 ENCOUNTER — Telehealth: Payer: Self-pay | Admitting: Internal Medicine

## 2022-02-16 NOTE — Telephone Encounter (Signed)
Patient is returning a call regarding her CPAP.  She stated that she thinks there is some confusion as to what she needs.  She would like to call her back to discuss at [phone]

## 2022-02-16 NOTE — Telephone Encounter (Signed)
lmtcb

## 2022-02-16 NOTE — Telephone Encounter (Signed)
Received request from Dr Melvyn Novas to obtain CPAP DL on this pt.  I am unsure of her DME  Called pt, there was no answer- LMTCB

## 2022-02-23 ENCOUNTER — Ambulatory Visit (INDEPENDENT_AMBULATORY_CARE_PROVIDER_SITE_OTHER): Payer: HMO

## 2022-02-23 ENCOUNTER — Ambulatory Visit (INDEPENDENT_AMBULATORY_CARE_PROVIDER_SITE_OTHER): Payer: HMO | Admitting: Orthopedic Surgery

## 2022-02-23 ENCOUNTER — Encounter: Payer: Self-pay | Admitting: Orthopedic Surgery

## 2022-02-23 VITALS — Ht 59.0 in | Wt 221.0 lb

## 2022-02-23 DIAGNOSIS — M5441 Lumbago with sciatica, right side: Secondary | ICD-10-CM

## 2022-02-23 DIAGNOSIS — G8929 Other chronic pain: Secondary | ICD-10-CM

## 2022-02-24 ENCOUNTER — Encounter: Payer: Self-pay | Admitting: Orthopedic Surgery

## 2022-02-24 NOTE — Progress Notes (Signed)
New Patient Visit  Assessment: Kristen Mack is a 76 y.o. female with the following: 1. Chronic midline low back pain with right-sided sciatica 2.  Right knee pain   Plan: Kristen Mack has pain in her right knee, as well as low back pain.  She states low back pain radiates into her right leg.  I offered her an injection of her right knee, and she is not interested.  Regarding her back, she has attempted medications, and previously attempted physical therapy, without improvement in her symptoms.  On radiographs, she has degenerative changes, as well as likely degenerative scoliosis.  She has symptoms of nerve irritation rating in the right leg.  As result, recommending a lumbar spine x-ray.  This will be scheduled.  We will meet discuss the results.  Follow-up: Return for After MRI.  Subjective:  Chief Complaint  Patient presents with   Back Pain    Centralized LBP, and pain inrt side buttocks that causes knee pain.     History of Present Illness: Kristen Mack is a 76 y.o. female who has been referred by  Sallee Lange, MD for evaluation of right knee pain.  She states she has ongoing right knee pain.  She also has pain in her lower back, radiating into the right leg.  This radiates towards the right knee.  She has tried physical therapy for her back, without sustained improvements.  She has been taking medications, and using topical treatments for both her knee and her back.  Limited improvement in her symptoms.   Review of Systems: No fevers or chills  No numbness or tingling No chest pain No shortness of breath No bowel or bladder dysfunction No GI distress No headaches   Medical History:  Past Medical History:  Diagnosis Date   ADHD (attention deficit hyperactivity disorder) 2007   Adrenal adenoma, left    followed by dr nida   Arthritis    Asthma    followed by pcp   CFS (chronic fatigue syndrome)    CKD (chronic kidney disease), stage III  [facility])    nephrologist---  dr Joelyn Oms   Depression    Edema of both lower extremities    Fatty liver disease, nonalcoholic    Fibromyalgia    GERD (gastroesophageal reflux disease)    Hiatal hernia    History of adenomatous polyp of colon    History of cardiac murmur    until age 24- pt has had echocardiograms   History of TIA (transient ischemic attack)    per CT scan    Hyperlipidemia    Hypertension    Hypothyroidism    endocrinologist-- dr g. nida   IBS (irritable bowel syndrome)    Meniere disease    OSA on CPAP    per pt uses nightly   Osteopenia    Pancreas cyst    followed by pcp   PMB (postmenopausal bleeding)    PVC's (premature ventricular contractions)    cardiologist--- dr Audie Box   Type 2 diabetes mellitus treated with insulin [facility])    endocrinologist-- dr g. nida;    (08-14-2021  per pt checks blood sugar QID ,  fasting average --- 73-150)   Urinary incontinence    Wears glasses    Wears partial dentures    upper    Past Surgical History:  Procedure Laterality Date   CERVICAL POLYPECTOMY N/A 08/17/2021   Procedure: BIOPSY OF ENDOMETRIUM AND OR POLYPECTOMY;  Surgeon: Sanjuana Kava, MD;  Location: [place];  Service: Gynecology;  Laterality: N/A;   [place]   and  REPAIR UMBILICAL HERNIA   COLONOSCOPY N/A 04/19/2013   Procedure: COLONOSCOPY;  Surgeon: Rogene Houston, MD;  [facility];  Service: Endoscopy;  Laterality: N/A;     DILITATION & CURRETTAGE/HYSTROSCOPY WITH NOVASURE ABLATION N/A 08/17/2021   Procedure: DILATATION & CURETTAGE/HYSTEROSCOPY;  Surgeon: Sanjuana Kava, MD;  Location: [place];  Service: Gynecology;  Laterality: N/A;   ESOPHAGOGASTRODUODENOSCOPY  02/08/2003   HERNIA REPAIR  1995   HYSTEROSCOPY WITH D & C  11/14/2002   endometrial polypectomy   HYSTEROSCOPY WITH D & C N/A 04/05/2014   Procedure: DILATATION AND CURETTAGE /HYSTEROSCOPY, ENDOMETRIAL  POLYPECTOMY;  Surgeon: Sanjuana Kava, MD;  Location: [place];  Service: Gynecology;  Laterality: N/A;   LIVER BIOPSY  01/29/2004   benign   TUBAL LIGATION  1979    Family History  Problem Relation Age of Onset   Rectal cancer Brother 51   Diabetes Brother    Ovarian cancer Mother 89   Hypertension Mother    Diabetes Mother    Cancer Mother    Depression Mother    Early death Mother    Obesity Mother    Hypertension Father    Diabetes Father    Depression Father    Hyperlipidemia Father    Stroke Father    Breast cancer Maternal Aunt    Breast cancer Paternal Aunt        four Paternal aunts   Heart disease Paternal Grandfather    Hyperlipidemia Paternal Grandfather    Hypertension Paternal Grandfather    Stroke Paternal Grandfather    Liver disease Sister        x 2; fatty liver   Hearing loss Maternal Grandfather    Hypertension Maternal Grandfather    Heart disease Maternal Grandmother    Hypertension Maternal Grandmother    Obesity Maternal Grandmother    Obesity Paternal Grandmother    ADD / ADHD Daughter    Depression Daughter    ADD / ADHD Daughter    Depression Daughter    Alcohol abuse Maternal Uncle    Depression Maternal Uncle    Drug abuse Maternal Uncle    Early death Maternal Uncle    Alcohol abuse Paternal Uncle    Depression Paternal Uncle    Drug abuse Paternal Uncle    Anxiety disorder Sister    Depression Sister    Hyperlipidemia Sister    Miscarriages / Stillbirths Sister    Cancer Brother    Depression Brother    Early death Brother    Cancer Maternal Aunt    Cancer Maternal Aunt    Cancer Paternal Aunt    Cancer Paternal Aunt    Cancer Paternal Aunt    Cancer Paternal Aunt    Drug abuse Brother    Heart disease Maternal Uncle    Hyperlipidemia Maternal Uncle    Hypertension Maternal Uncle    Hyperlipidemia Sister    Hyperlipidemia Brother    Hyperlipidemia Brother    Hypertension Maternal Uncle    Pancreatic  cancer Neg Hx    Esophageal cancer Neg Hx    Social History   Tobacco Use   Smoking status: Former    Packs/day: 1.50    Years: 3.00    Total pack years: 4.50    Types: Cigarettes    Quit date:  10/03/1968    Years since quitting: 53.4   Smokeless tobacco: Never  Vaping Use   Vaping Use: Never used  Substance Use Topics   Alcohol use: No   Drug use: No    Allergies  Allergen Reactions   Latex Other (See Comments) and Itching    "skin gets raw"   Ambien [Zolpidem Tartrate] Other (See Comments)    Side effect "lost a couple days"; memory loss   Cefzil [Cefprozil] Diarrhea   Citalopram     Other reaction(s): Not available   Codeine Itching and Nausea And Vomiting   Demerol [Meperidine] Other (See Comments)    "messes up senses"   Formaldehyde    Sulfa Antibiotics Other (See Comments)    Severe abd cramp   Zolpidem     Other reaction(s): Not available   Celexa [Citalopram Hydrobromide] Other (See Comments)    Mouth sores    Current Meds  Medication Sig   albuterol (PROVENTIL) (2.5 MG/3ML) 0.083% nebulizer solution Take 3 mLs (2.5 mg total) by nebulization every 6 (six) hours as needed for wheezing or shortness of breath.   albuterol (VENTOLIN HFA) 108 (90 Base) MCG/ACT inhaler INHALE 2 PUFFS INTO LUNGS EVERY 6 HOURS AS NEEDED FOR SHORTNESS OF BREATH (Patient taking differently: 2 puffs every 6 (six) hours as needed. INHALE 2 PUFFS INTO LUNGS EVERY 6 HOURS AS NEEDED FOR SHORTNESS OF BREATH)   Azelastine HCl 137 MCG/SPRAY SOLN USE 2 SPRAY(S) IN EACH NOSTRIL TWICE DAILY (Patient taking differently: Place 2 sprays into the nose 2 (two) times daily.)   B Complex Vitamins (B COMPLEX 100 PO) Take 1 tablet by mouth daily.   blood glucose meter kit and supplies Dispense based on patient and insurance preference. Use to test sugars BID  (FOR ICD-10 E10.9, E11.9).   buPROPion (WELLBUTRIN XL) 300 MG 24 hr tablet Take 1 tablet (300 mg total) by mouth daily.   Chromium Picolinate 1000  MCG TABS Take 1 tablet by mouth daily.   Coenzyme Q10 (COQ10) 200 MG CAPS Take 200 mg by mouth daily.   Continuous Blood Gluc Receiver (FREESTYLE LIBRE 2 READER) DEVI As directed   Continuous Blood Gluc Sensor (FREESTYLE LIBRE 2 SENSOR) MISC 1 Piece by Does not apply route every 14 (fourteen) days.   Cyanocobalamin (B-12) 1000 MCG SUBL Place 1,000 mcg under the tongue daily.   DIGESTIVE ENZYMES PO Take 1 tablet by mouth 3 (three) times daily.    glucose blood test strip Use to check blood glucose four times daily as instructed   ipratropium (ATROVENT) 0.02 % nebulizer solution USE ONE VIAL IN NEBULIZER 4 TIMES DAILY AS NEEDED FOR SHORTNESS OF BREATH (Patient taking differently: every 4 (four) hours as needed. USE ONE VIAL IN NEBULIZER 4 TIMES DAILY AS NEEDED FOR SHORTNESS OF BREATH)   LEVEMIR FLEXPEN 100 UNIT/ML FlexPen INJECT 50 UNITS SUBCUTANEOUSLY ONCE DAILY AT BEDTIME   levothyroxine (SYNTHROID) 125 MCG tablet TAKE 1 TABLET BY MOUTH ONCE DAILY BEFORE BREAKFAST   Lysine HCl 500 MG TABS Take 500 mg by mouth daily.   Magnesium 250 MG TABS Take 250 mg by mouth 2 (two) times daily.   meclizine (ANTIVERT) 25 MG tablet Take 25 mg by mouth 3 (three) times daily as needed for dizziness.   montelukast (SINGULAIR) 10 MG tablet TAKE 1 TABLET BY MOUTH AT BEDTIME   OVER THE COUNTER MEDICATION Take 1 Scoop by mouth 3 (three) times daily with meals. Fennel seeds   OVER THE COUNTER MEDICATION Choline and   inisotol   rosuvastatin (CRESTOR) 5 MG tablet TAKE 1 TABLET BY MOUTH ON MONDAY, WEDNESDAY,  FRIDAY AND SATURDAY   spironolactone (ALDACTONE) 25 MG tablet Take 0.5 tablets (12.5 mg total) by mouth daily.   TART CHERRY PO Take 1 tablet by mouth 3 (three) times daily.   tizanidine (ZANAFLEX) 2 MG capsule 1 qhs prn caution drowsiness   Vitamin A 3 MG (10000 UT) TABS Take 10,000 Units by mouth daily.   vitamin E 180 MG (400 UNITS) capsule Take 800 Units by mouth daily.     Objective: Ht 4' 11" (1.499 m)   Wt  221 lb (100.2 kg)   BMI 44.64 kg/m   Physical Exam:  General: Elderly female., Alert and oriented., and No acute distress. Gait: Ambulates with the assistance of a walker.  Right knee with mild swelling.  Tenderness palpation along the medial lateral joint line.  Negative Lachman.  No increased laxity varus 5 stress.  Positive straight leg raise.  Tenderness palpation of the lower back.  4/5 strength in bilateral lower extremities.  IMAGING: I personally ordered and reviewed the following images  Standing lumbar spine x-rays were obtained in clinic today.  Diffuse degenerative changes are noted.  Degenerative scoliosis.  No anterolisthesis.  No acute injuries.  Impression: Lumbar spine x-ray with diffuse degenerative changes and scoliosis.   New Medications:  No orders of the defined types were placed in this encounter.     Mordecai Rasmussen, MD  02/24/2022 10:58 PM

## 2022-02-24 NOTE — Telephone Encounter (Signed)
Spoke with the pt  She uses [facility] for CPAP Called them at [phone] and [facility] for Mariann Laster to fax DL to B pod office  Will await the results

## 2022-02-25 ENCOUNTER — Encounter: Payer: Self-pay | Admitting: "Endocrinology

## 2022-02-25 ENCOUNTER — Ambulatory Visit (INDEPENDENT_AMBULATORY_CARE_PROVIDER_SITE_OTHER): Payer: HMO | Admitting: "Endocrinology

## 2022-02-25 VITALS — BP 138/86 | HR 64 | Ht 59.0 in | Wt 221.8 lb

## 2022-02-25 DIAGNOSIS — E038 Other specified hypothyroidism: Secondary | ICD-10-CM | POA: Diagnosis not present

## 2022-02-25 DIAGNOSIS — N183 Chronic kidney disease, stage 3 unspecified: Secondary | ICD-10-CM

## 2022-02-25 DIAGNOSIS — D3502 Benign neoplasm of left adrenal gland: Secondary | ICD-10-CM | POA: Diagnosis not present

## 2022-02-25 DIAGNOSIS — E782 Mixed hyperlipidemia: Secondary | ICD-10-CM | POA: Diagnosis not present

## 2022-02-25 DIAGNOSIS — I129 Hypertensive chronic kidney disease with stage 1 through stage 4 chronic kidney disease, or unspecified chronic kidney disease: Secondary | ICD-10-CM | POA: Diagnosis not present

## 2022-02-25 DIAGNOSIS — E1122 Type 2 diabetes mellitus with diabetic chronic kidney disease: Secondary | ICD-10-CM

## 2022-02-25 NOTE — Progress Notes (Signed)
02/25/2022, 7:02 PM     Endocrinology follow-up note   Subjective:    Patient ID: Kristen Mack, female    DOB: November 05, 1945, PCP Kathyrn Drown, MD   Past Medical History:  Diagnosis Date   ADHD (attention deficit hyperactivity disorder) 2007   Adrenal adenoma, left    followed by dr Ray Gervasi   Arthritis    Asthma    followed by pcp   CFS (chronic fatigue syndrome)    CKD (chronic kidney disease), stage III [facility])    nephrologist---  dr Joelyn Oms   Depression    Edema of both lower extremities    Fatty liver disease, nonalcoholic    Fibromyalgia    GERD (gastroesophageal reflux disease)    Hiatal hernia    History of adenomatous polyp of colon    History of cardiac murmur    until age 32- pt has had echocardiograms   History of TIA (transient ischemic attack)    per CT scan    Hyperlipidemia    Hypertension    Hypothyroidism    endocrinologist-- dr g. Unique Sillas   IBS (irritable bowel syndrome)    Meniere disease    OSA on CPAP    per pt uses nightly   Osteopenia    Pancreas cyst    followed by pcp   PMB (postmenopausal bleeding)    PVC's (premature ventricular contractions)    cardiologist--- dr Audie Box   Type 2 diabetes mellitus treated with insulin [facility])    endocrinologist-- dr g. Haroldine Redler;    (08-14-2021  per pt checks blood sugar QID ,  fasting average --- 73-150)   Urinary incontinence    Wears glasses    Wears partial dentures    upper   Past Surgical History:  Procedure Laterality Date   CERVICAL POLYPECTOMY N/A 08/17/2021   Procedure: BIOPSY OF ENDOMETRIUM AND OR POLYPECTOMY;  Surgeon: Sanjuana Kava, MD;  Location: [place];  Service: Gynecology;  Laterality: N/A;   [place]   and  REPAIR UMBILICAL HERNIA   COLONOSCOPY N/A 04/19/2013   Procedure: COLONOSCOPY;  Surgeon: Rogene Houston, MD;  [facility];  Service: Endoscopy;   Laterality: N/A;     DILITATION & CURRETTAGE/HYSTROSCOPY WITH NOVASURE ABLATION N/A 08/17/2021   Procedure: DILATATION & CURETTAGE/HYSTEROSCOPY;  Surgeon: Sanjuana Kava, MD;  Location: [place];  Service: Gynecology;  Laterality: N/A;   ESOPHAGOGASTRODUODENOSCOPY  02/08/2003   HERNIA REPAIR  1995   HYSTEROSCOPY WITH D & C  11/14/2002   endometrial polypectomy   HYSTEROSCOPY WITH D & C N/A 04/05/2014   Procedure: DILATATION AND CURETTAGE /HYSTEROSCOPY, ENDOMETRIAL POLYPECTOMY;  Surgeon: Sanjuana Kava, MD;  Location: [place];  Service: Gynecology;  Laterality: N/A;   LIVER BIOPSY  01/29/2004   benign   TUBAL LIGATION  1979   Social History   Socioeconomic History   Marital status: Married    Spouse name: Herbie Baltimore   Number of children: 2   Years of education: Not on file   Highest education level: Not on file  Occupational History   Occupation: retired, Therapist, sports  Tobacco Use   Smoking  status: Former    Packs/day: 1.50    Years: 3.00    Total pack years: 4.50    Types: Cigarettes    Quit date: 10/03/1968    Years since quitting: 53.4   Smokeless tobacco: Never  Vaping Use   Vaping Use: Never used  Substance and Sexual Activity   Alcohol use: No   Drug use: No   Sexual activity: Not Currently    Birth control/protection: None  Other Topics Concern   Not on file  Social History Narrative   2 daughters, Amy and Sharyn Lull. Amy lives with parents. Sharyn Lull lives in [place].   Social Determinants of Health   Financial Resource Strain: Medium Risk (01/05/2022)   Overall Financial Resource Strain (CARDIA)    Difficulty of Paying Living Expenses: Somewhat hard  Food Insecurity: No Food Insecurity (01/05/2022)   Hunger Vital Sign    Worried About Running Out of Food in the Last Year: Never true    Ran Out of Food in the Last Year: Never true  Transportation Needs: No Transportation Needs (01/05/2022)   PRAPARE - Hydrologist  (Medical): No    Lack of Transportation (Non-Medical): No  Physical Activity: Inactive (01/05/2022)   Exercise Vital Sign    Days of Exercise per Week: 0 days    Minutes of Exercise per Session: 0 min  Stress: No Stress Concern Present (01/05/2022)   [place]    Feeling of Stress : Only a little  Social Connections: Moderately Isolated (01/05/2022)   Social Connection and Isolation Panel [NHANES]    Frequency of Communication with Friends and Family: More than three times a week    Frequency of Social Gatherings with Friends and Family: Never    Attends Religious Services: Never    Marine scientist or Organizations: No    Attends Archivist Meetings: Never    Marital Status: Married   Outpatient Encounter Medications as of 02/25/2022  Medication Sig   albuterol (PROVENTIL) (2.5 MG/3ML) 0.083% nebulizer solution Take 3 mLs (2.5 mg total) by nebulization every 6 (six) hours as needed for wheezing or shortness of breath.   albuterol (VENTOLIN HFA) 108 (90 Base) MCG/ACT inhaler INHALE 2 PUFFS INTO LUNGS EVERY 6 HOURS AS NEEDED FOR SHORTNESS OF BREATH (Patient taking differently: 2 puffs every 6 (six) hours as needed. INHALE 2 PUFFS INTO LUNGS EVERY 6 HOURS AS NEEDED FOR SHORTNESS OF BREATH)   Azelastine HCl 137 MCG/SPRAY SOLN USE 2 SPRAY(S) IN EACH NOSTRIL TWICE DAILY (Patient taking differently: Place 2 sprays into the nose 2 (two) times daily.)   B Complex Vitamins (B COMPLEX 100 PO) Take 1 tablet by mouth daily.   blood glucose meter kit and supplies Dispense based on patient and insurance preference. Use to test sugars BID  (FOR ICD-10 E10.9, E11.9).   buPROPion (WELLBUTRIN XL) 300 MG 24 hr tablet Take 1 tablet (300 mg total) by mouth daily.   Chromium Picolinate 1000 MCG TABS Take 1 tablet by mouth daily.   Coenzyme Q10 (COQ10) 200 MG CAPS Take 200 mg by mouth daily.   Continuous Blood Gluc Receiver  (FREESTYLE LIBRE 2 READER) DEVI As directed   Continuous Blood Gluc Sensor (FREESTYLE LIBRE 2 SENSOR) MISC 1 Piece by Does not apply route every 14 (fourteen) days.   Cyanocobalamin (B-12) 1000 MCG SUBL Place 1,000 mcg under the tongue daily.   DIGESTIVE ENZYMES PO Take 1 tablet by  mouth 3 (three) times daily.    gabapentin (NEURONTIN) 100 MG capsule Take 1 capsule (100 mg total) by mouth 2 (two) times daily.   glucose blood test strip Use to check blood glucose four times daily as instructed   LEVEMIR FLEXPEN 100 UNIT/ML FlexPen INJECT 50 UNITS SUBCUTANEOUSLY ONCE DAILY AT BEDTIME (Patient taking differently: 46 Units at bedtime.)   levothyroxine (SYNTHROID) 125 MCG tablet TAKE 1 TABLET BY MOUTH ONCE DAILY BEFORE BREAKFAST   Lysine HCl 500 MG TABS Take 500 mg by mouth daily.   Magnesium 250 MG TABS Take 250 mg by mouth 2 (two) times daily.   meclizine (ANTIVERT) 25 MG tablet Take 25 mg by mouth 3 (three) times daily as needed for dizziness.   montelukast (SINGULAIR) 10 MG tablet TAKE 1 TABLET BY MOUTH AT BEDTIME   OVER THE COUNTER MEDICATION Take 1 Scoop by mouth 3 (three) times daily with meals. Fennel seeds   OVER THE COUNTER MEDICATION Choline and inisotol   rosuvastatin (CRESTOR) 5 MG tablet TAKE 1 TABLET BY MOUTH ON MONDAY, WEDNESDAY,  FRIDAY AND SATURDAY   spironolactone (ALDACTONE) 25 MG tablet Take 0.5 tablets (12.5 mg total) by mouth daily.   TART CHERRY PO Take 1 tablet by mouth 3 (three) times daily.   tizanidine (ZANAFLEX) 2 MG capsule 1 qhs prn caution drowsiness   Vitamin A 3 MG (10000 UT) TABS Take 10,000 Units by mouth daily.   vitamin E 180 MG (400 UNITS) capsule Take 800 Units by mouth daily.    [DISCONTINUED] ipratropium (ATROVENT) 0.02 % nebulizer solution USE ONE VIAL IN NEBULIZER 4 TIMES DAILY AS NEEDED FOR SHORTNESS OF BREATH (Patient taking differently: every 4 (four) hours as needed. USE ONE VIAL IN NEBULIZER 4 TIMES DAILY AS NEEDED FOR SHORTNESS OF BREATH)    Facility-Administered Encounter Medications as of 02/25/2022  Medication   gentamicin (GARAMYCIN) 330 mg in dextrose 5 % 100 mL IVPB   ALLERGIES: Allergies  Allergen Reactions   Latex Other (See Comments) and Itching    "skin gets raw"   Ambien [Zolpidem Tartrate] Other (See Comments)    Side effect "lost a couple days"; memory loss   Cefzil [Cefprozil] Diarrhea   Citalopram     Other reaction(s): Not available   Codeine Itching and Nausea And Vomiting   Demerol [Meperidine] Other (See Comments)    "messes up senses"   Formaldehyde    Sulfa Antibiotics Other (See Comments)    Severe abd cramp   Zolpidem     Other reaction(s): Not available   Celexa [Citalopram Hydrobromide] Other (See Comments)    Mouth sores    VACCINATION STATUS: Immunization History  Administered Date(s) Administered   Fluad Quad(high Dose 65+) 01/22/2020, 02/10/2021, 01/22/2022   Influenza, High Dose Seasonal PF 04/15/2018, 01/13/2019   Influenza,inj,Quad PF,6+ Mos 03/02/2016, 03/03/2017   Influenza-Unspecified 04/15/2018, 01/15/2019, 01/23/2020   Moderna Sars-Covid-2 Vaccination 05/23/2019, 06/19/2019, 03/05/2020   Pneumococcal Conjugate-13 05/22/2014   Pneumococcal Polysaccharide-23 03/03/2017   Tdap 04/18/2020   Zoster Recombinat (Shingrix) 12/08/2018, 04/27/2019    HPI DIAHN WAIDELICH is 76 y.o. female who is accompanied by her daughter to clinic.  She usually follows in this clinic for stable, nonfunctioning 1.5 cm left adrenal adenoma which was previously worked up completely.  She also has hypothyroidism, type 2 diabetes followed in this clinic.  She remains on Levemir 46 units nightly.  She presents with her CGM device today showing 73% time range, 24% level 1 hyperglycemia, 2% level 2 hyperglycemia.  No hypoglycemia.  Her recent A1c was 7.3% proving from 12.5%.  She remains on levothyroxine 125 mcg p.o. daily before breakfast.  She reports interruption of her levothyroxine for at  least a week prior to her lab work.  Her previsit thyroid function tests are consistent with appropriate replacement.     She has no new complaints today. -In April 2022,  she underwent surveillance MRI abdomen/pelvis which documented stable adrenal finding.  This study was done to follow-up pancreatic cyst which was also reported to be stable and favoring benign findings.   -Her  previous 24-hour urine function studies for adrenal function have been within normal limits .  -She denies history of difficult to control hypertension, in fact has history of orthostatic hypotension.  She denies spells of palpitations, headaches, sweating. -She denies close  family history of pituitary, adrenal dysfunctions.  Review of Systems  Limited as above.  Objective:    BP 138/86   Pulse 64   Ht _0  (1.499 m)   Wt 221 lb 12.8 oz (100.6 kg)   BMI 44.80 kg/m   Wt Readings from Last 3 Encounters:  02/25/22 221 lb 12.8 oz (100.6 kg)  02/23/22 221 lb (100.2 kg)  01/28/22 220 lb 3.2 oz (99.9 kg)    Recent Results (from the past 2160 hour(s))  SARS Coronavirus 2 by RT PCR (hospital order, performed in [place] hospital lab) *cepheid single result test* Anterior Nasal Swab     Status: None   Collection Time: 12/11/21  1:05 PM   Specimen: Anterior Nasal Swab  Result Value Ref Range   SARS Coronavirus 2 by RT PCR NEGATIVE NEGATIVE    Comment: (NOTE) SARS-CoV-2 target nucleic acids are NOT DETECTED.  The SARS-CoV-2 RNA is generally detectable in upper and lower respiratory specimens during the acute phase of infection. The lowest concentration of SARS-CoV-2 viral copies this assay can detect is 250 copies / mL. A negative result does not preclude SARS-CoV-2 infection and should not be used as the sole basis for treatment or other patient management decisions.  A negative result may occur with improper specimen collection / handling, submission of specimen other than nasopharyngeal swab,  presence of viral mutation(s) within the areas targeted by this assay, and inadequate number of viral copies (<250 copies / mL). A negative result must be combined with clinical observations, patient history, and epidemiological information.  Fact Sheet for Patients:   https://www.patel.info/  Fact Sheet for Healthcare Providers: https://hall.com/  This test is not yet approved or  cleared by the [place] FDA and has been authorized for detection and/or diagnosis of SARS-CoV-2 by FDA under an Emergency Use Authorization (EUA).  This EUA will remain in effect (meaning this test can be used) for the duration of the COVID-19 declaration under Section 564(b)(1) of the Act, 21 U.S.C. section 360bbb-3(b)(1), unless the authorization is terminated or revoked sooner.  Performed at [facility], [address], [place], [place] [address]   CBG monitoring, ED     Status: Abnormal   Collection Time: 12/11/21  1:41 PM  Result Value Ref Range   Glucose-Capillary 177 (H) 70 - 99 mg/dL    Comment: Glucose reference range applies only to samples taken after fasting for at least 8 hours.  Comprehensive metabolic panel     Status: Abnormal   Collection Time: 12/11/21  2:17 PM  Result Value Ref Range   Sodium 140 135 - 145 mmol/L   Potassium 2.7 (LL) 3.5 - 5.1 mmol/L  Comment: CRITICAL RESULT CALLED TO, READ BACK BY AND VERIFIED WITH: MEGAN WHITE _0  12/11/21 BY GMCGEEHON.    Chloride 109 98 - 111 mmol/L   CO2 24 22 - 32 mmol/L   Glucose, Bld 152 (H) 70 - 99 mg/dL    Comment: Glucose reference range applies only to samples taken after fasting for at least 8 hours.   BUN 23 8 - 23 mg/dL   Creatinine, Ser 1.41 (H) 0.44 - 1.00 mg/dL   Calcium 9.0 8.9 - 10.3 mg/dL   Total Protein 7.4 6.5 - 8.1 g/dL   Albumin 3.2 (L) 3.5 - 5.0 g/dL   AST 24 15 - 41 U/L   ALT 25 0 - 44 U/L   Alkaline Phosphatase 73 38 - 126 U/L   Total Bilirubin 0.7 0.3 - 1.2  mg/dL   GFR, Estimated 38 (L) >60 mL/min    Comment: (NOTE) Calculated using the CKD-EPI Creatinine Equation (2021)    Anion gap 7 5 - 15    Comment: Performed at [facility], [address], [place], [place] [address]  Troponin I (High Sensitivity)     Status: None   Collection Time: 12/11/21  2:17 PM  Result Value Ref Range   Troponin I (High Sensitivity) 17 <18 ng/L    Comment: (NOTE) Elevated high sensitivity troponin I (hsTnI) values and significant  changes across serial measurements may suggest ACS but many other  chronic and acute conditions are known to elevate hsTnI results.  Refer to the Links section for chest pain algorithms and additional  guidance. Performed at [facility], [address], [place], [place] [address]   Brain natriuretic peptide     Status: Abnormal   Collection Time: 12/11/21  2:17 PM  Result Value Ref Range   B Natriuretic Peptide 380.0 (H) 0.0 - 100.0 pg/mL    Comment: Performed at [facility], [address], [place], [place] [address]  CBC with Differential     Status: None   Collection Time: 12/11/21  2:17 PM  Result Value Ref Range   WBC 9.5 4.0 - 10.5 K/uL   RBC 4.50 3.87 - 5.11 MIL/uL   Hemoglobin 12.2 12.0 - 15.0 g/dL   HCT 37.5 36.0 - 46.0 %   MCV 83.3 80.0 - 100.0 fL   MCH 27.1 26.0 - 34.0 pg   MCHC 32.5 30.0 - 36.0 g/dL   RDW 14.3 11.5 - 15.5 %   Platelets 393 150 - 400 K/uL   nRBC 0.0 0.0 - 0.2 %   Neutrophils Relative % 74 %   Neutro Abs 7.0 1.7 - 7.7 K/uL   Lymphocytes Relative 16 %   Lymphs Abs 1.6 0.7 - 4.0 K/uL   Monocytes Relative 7 %   Monocytes Absolute 0.7 0.1 - 1.0 K/uL   Eosinophils Relative 2 %   Eosinophils Absolute 0.2 0.0 - 0.5 K/uL   Basophils Relative 1 %   Basophils Absolute 0.1 0.0 - 0.1 K/uL   Immature Granulocytes 0 %   Abs Immature Granulocytes 0.03 0.00 - 0.07 K/uL    Comment: Performed at [facility], [address] [place], [place] [address]  Hemoglobin A1c     Status: Abnormal   Collection Time:  12/11/21  2:17 PM  Result Value Ref Range   Hgb A1c MFr Bld 7.3 (H) 4.8 - 5.6 %    Comment: (NOTE) Pre diabetes:          5.7%-6.4%  Diabetes:              >  6.4%  Glycemic control for   <7.0% adults with diabetes    Mean Plasma Glucose 162.81 mg/dL    Comment: Performed at [facility] [address], [place], [place] [address]  Magnesium     Status: None   Collection Time: 12/11/21  3:31 PM  Result Value Ref Range   Magnesium 1.9 1.7 - 2.4 mg/dL    Comment: Performed at [facility], [address] [place], [place] [address]  Troponin I (High Sensitivity)     Status: Abnormal   Collection Time: 12/11/21  4:17 PM  Result Value Ref Range   Troponin I (High Sensitivity) 18 (H) <18 ng/L    Comment: (NOTE) Elevated high sensitivity troponin I (hsTnI) values and significant  changes across serial measurements may suggest ACS but many other  chronic and acute conditions are known to elevate hsTnI results.  Refer to the "Links" section for chest pain algorithms and additional  guidance. Performed at [facility], [address], [place], [place] [address]   CBG monitoring, ED     Status: Abnormal   Collection Time: 12/11/21  4:59 PM  Result Value Ref Range   Glucose-Capillary 129 (H) 70 - 99 mg/dL    Comment: Glucose reference range applies only to samples taken after fasting for at least 8 hours.  TSH     Status: Abnormal   Collection Time: 12/11/21  5:04 PM  Result Value Ref Range   TSH 7.410 (H) 0.350 - 4.500 uIU/mL    Comment: Performed by a 3rd Generation assay with a functional sensitivity of <=0.01 uIU/mL. Performed at [facility], [address], [place], [place] [address]   Urinalysis, Routine w reflex microscopic     Status: Abnormal   Collection Time: 12/11/21  6:26 PM  Result Value Ref Range   Color, Urine STRAW (A) YELLOW   APPearance CLEAR CLEAR   Specific Gravity, Urine 1.003 (L) 1.005 - 1.030   pH 6.0 5.0 - 8.0   Glucose, UA NEGATIVE NEGATIVE mg/dL    Hgb urine dipstick NEGATIVE NEGATIVE   Bilirubin Urine NEGATIVE NEGATIVE   Ketones, ur NEGATIVE NEGATIVE mg/dL   Protein, ur NEGATIVE NEGATIVE mg/dL   Nitrite NEGATIVE NEGATIVE   Leukocytes,Ua SMALL (A) NEGATIVE   RBC / HPF 0-5 0 - 5 RBC/hpf   WBC, UA 21-50 0 - 5 WBC/hpf   Bacteria, UA RARE (A) NONE SEEN   Squamous Epithelial / LPF 0-5 0 - 5    Comment: Performed at [facility], [address], [place], [place] [address]  MRSA Next Gen by PCR, Nasal     Status: None   Collection Time: 12/11/21  8:20 PM   Specimen: Nasal Mucosa; Nasal Swab  Result Value Ref Range   MRSA by PCR Next Gen NOT DETECTED NOT DETECTED    Comment: (NOTE) The GeneXpert MRSA Assay (FDA approved for NASAL specimens only), is one component of a comprehensive MRSA colonization surveillance program. It is not intended to diagnose MRSA infection nor to guide or monitor treatment for MRSA infections. Test performance is not FDA approved in patients less than 9 years old. Performed at [facility], [address], [place], [place] [address]   Glucose, capillary     Status: Abnormal   Collection Time: 12/11/21  9:10 PM  Result Value Ref Range   Glucose-Capillary 123 (H) 70 - 99 mg/dL    Comment: Glucose reference range applies only to samples taken after fasting for at least 8 hours.  CBC     Status: Abnormal   Collection Time: 12/11/21  9:31 PM  Result Value Ref Range   WBC 12.0 (H) 4.0 - 10.5 K/uL   RBC 4.74 3.87 - 5.11 MIL/uL   Hemoglobin 12.9 12.0 - 15.0 g/dL   HCT 39.4 36.0 - 46.0 %   MCV 83.1 80.0 - 100.0 fL   MCH 27.2 26.0 - 34.0 pg   MCHC 32.7 30.0 - 36.0 g/dL   RDW 14.5 11.5 - 15.5 %   Platelets 399 150 - 400 K/uL   nRBC 0.0 0.0 - 0.2 %    Comment: Performed at [facility], [address], [place], [place] [address]  Basic metabolic panel     Status: Abnormal   Collection Time: 12/11/21  9:31 PM  Result Value Ref Range   Sodium 141 135 - 145 mmol/L   Potassium 3.1 (L) 3.5 - 5.1 mmol/L   Chloride  112 (H) 98 - 111 mmol/L   CO2 22 22 - 32 mmol/L   Glucose, Bld 132 (H) 70 - 99 mg/dL    Comment: Glucose reference range applies only to samples taken after fasting for at least 8 hours.   BUN 19 8 - 23 mg/dL   Creatinine, Ser 1.35 (H) 0.44 - 1.00 mg/dL   Calcium 8.8 (L) 8.9 - 10.3 mg/dL   GFR, Estimated 40 (L) >60 mL/min    Comment: (NOTE) Calculated using the CKD-EPI Creatinine Equation (2021)    Anion gap 7 5 - 15    Comment: Performed at [facility], [address], [place], [place] [address]  Magnesium     Status: None   Collection Time: 12/11/21  9:31 PM  Result Value Ref Range   Magnesium 2.4 1.7 - 2.4 mg/dL    Comment: Performed at [facility], [address] [place], [place] [address]  Basic metabolic panel     Status: Abnormal   Collection Time: 12/12/21  4:05 AM  Result Value Ref Range   Sodium 140 135 - 145 mmol/L   Potassium 3.5 3.5 - 5.1 mmol/L   Chloride 114 (H) 98 - 111 mmol/L   CO2 22 22 - 32 mmol/L   Glucose, Bld 121 (H) 70 - 99 mg/dL    Comment: Glucose reference range applies only to samples taken after fasting for at least 8 hours.   BUN 19 8 - 23 mg/dL   Creatinine, Ser 1.42 (H) 0.44 - 1.00 mg/dL   Calcium 8.3 (L) 8.9 - 10.3 mg/dL   GFR, Estimated 38 (L) >60 mL/min    Comment: (NOTE) Calculated using the CKD-EPI Creatinine Equation (2021)    Anion gap 4 (L) 5 - 15    Comment: Performed at [facility], [address], [place], [place] [address]  CBC     Status: Abnormal   Collection Time: 12/12/21  4:05 AM  Result Value Ref Range   WBC 10.4 4.0 - 10.5 K/uL   RBC 4.05 3.87 - 5.11 MIL/uL   Hemoglobin 10.9 (L) 12.0 - 15.0 g/dL   HCT 33.9 (L) 36.0 - 46.0 %   MCV 83.7 80.0 - 100.0 fL   MCH 26.9 26.0 - 34.0 pg   MCHC 32.2 30.0 - 36.0 g/dL   RDW 14.6 11.5 - 15.5 %   Platelets 361 150 - 400 K/uL   nRBC 0.0 0.0 - 0.2 %    Comment: Performed at [facility], [address], [place],  [address]  Glucose, capillary  Status: Abnormal   Collection  Time: 12/12/21  7:46 AM  Result Value Ref Range   Glucose-Capillary 176 (H) 70 - 99 mg/dL    Comment: Glucose reference range applies only to samples taken after fasting for at least 8 hours.  T3, free     Status: None   Collection Time: 12/12/21  8:28 AM  Result Value Ref Range   T3, Free 2.3 2.0 - 4.4 pg/mL    Comment: (NOTE) Performed At: [facility] [place], [place] [id number] Rush Farmer MD [id number]   T4, free     Status: None   Collection Time: 12/12/21  8:28 AM  Result Value Ref Range   Free T4 0.87 0.61 - 1.12 ng/dL    Comment: (NOTE) Biotin ingestion may interfere with free T4 tests. If the results are inconsistent with the TSH level, previous test results, or the clinical presentation, then consider biotin interference. If needed, order repeat testing after stopping biotin. Performed at [facility], [place] [address] [place], [place] [address]   ECHOCARDIOGRAM COMPLETE     Status: None   Collection Time: 12/12/21  8:56 AM  Result Value Ref Range   Weight 3,456 oz   Height 59 in   BP 138/70 mmHg   AR max vel 1.78 cm2   AV Area VTI 1.93 cm2   AV Mean grad 10.7 mmHg   AV Peak grad 21.7 mmHg   Ao pk vel 2.33 m/s   AV Area mean vel 1.93 cm2   MV VTI 2.82 cm2   Area-P 1/2 3.76 cm2   S' Lateral 3.30 cm  Glucose, capillary     Status: Abnormal   Collection Time: 12/12/21 11:22 AM  Result Value Ref Range   Glucose-Capillary 147 (H) 70 - 99 mg/dL    Comment: Glucose reference range applies only to samples taken after fasting for at least 8 hours.  Basic metabolic panel     Status: Abnormal   Collection Time: 12/17/21 10:44 AM  Result Value Ref Range   Glucose 124 (H) 70 - 99 mg/dL   BUN 25 8 - 27 mg/dL   Creatinine, Ser 1.38 (H) 0.57 - 1.00 mg/dL   eGFR 39 (L) >59 mL/min/1.73   BUN/Creatinine Ratio 18 12 - 28   Sodium 139 134 - 144 mmol/L   Potassium 4.7 3.5 - 5.2 mmol/L   Chloride 106 96 - 106 mmol/L   CO2 19 (L) 20 - 29  mmol/L   Calcium 9.6 8.7 - 10.3 mg/dL  Basic metabolic panel     Status: Abnormal   Collection Time: 12/23/21 12:30 PM  Result Value Ref Range   Glucose 114 (H) 70 - 99 mg/dL   BUN 22 8 - 27 mg/dL   Creatinine, Ser 1.41 (H) 0.57 - 1.00 mg/dL   eGFR 38 (L) >59 mL/min/1.73   BUN/Creatinine Ratio 16 12 - 28   Sodium 143 134 - 144 mmol/L   Potassium 4.7 3.5 - 5.2 mmol/L   Chloride 106 96 - 106 mmol/L   CO2 20 20 - 29 mmol/L   Calcium 9.7 8.7 - 10.3 mg/dL  Magnesium     Status: None   Collection Time: 12/23/21 12:30 PM  Result Value Ref Range   Magnesium 2.2 1.6 - 2.3 mg/dL  Basic metabolic panel     Status: Abnormal   Collection Time: 01/22/22  2:10 PM  Result Value Ref Range   Glucose 115 (H) 70 - 99 mg/dL  BUN 21 8 - 27 mg/dL   Creatinine, Ser 1.44 (H) 0.57 - 1.00 mg/dL   eGFR 37 (L) >59 mL/min/1.73   BUN/Creatinine Ratio 15 12 - 28   Sodium 143 134 - 144 mmol/L   Potassium 3.6 3.5 - 5.2 mmol/L   Chloride 107 (H) 96 - 106 mmol/L   CO2 20 20 - 29 mmol/L   Calcium 9.4 8.7 - 10.3 mg/dL  TSH     Status: None   Collection Time: 01/22/22  2:11 PM  Result Value Ref Range   TSH 2.980 0.450 - 4.500 uIU/mL  T4, free     Status: None   Collection Time: 01/22/22  2:11 PM  Result Value Ref Range   Free T4 1.01 0.82 - 1.77 ng/dL  POCT EXHALED NITRIC OXIDE     Status: Normal   Collection Time: 01/28/22  3:17 PM  Result Value Ref Range   FeNO level (ppb) 6   Basic metabolic panel     Status: Abnormal   Collection Time: 02/09/22  3:19 PM  Result Value Ref Range   Glucose 142 (H) 70 - 99 mg/dL   BUN 22 8 - 27 mg/dL   Creatinine, Ser 1.38 (H) 0.57 - 1.00 mg/dL   eGFR 39 (L) >59 mL/min/1.73   BUN/Creatinine Ratio 16 12 - 28   Sodium 143 134 - 144 mmol/L   Potassium 3.6 3.5 - 5.2 mmol/L   Chloride 105 96 - 106 mmol/L   CO2 22 20 - 29 mmol/L   Calcium 9.7 8.7 - 10.3 mg/dL  Magnesium     Status: None   Collection Time: 02/09/22  3:19 PM  Result Value Ref Range   Magnesium 1.9 1.6  - 2.3 mg/dL    Diabetic Labs (most recent): Lab Results  Component Value Date   HGBA1C 7.3 (H) 12/11/2021   HGBA1C 7.0 10/26/2021   HGBA1C 12.5 (H) 07/16/2021   MICROALBUR 0.4 05/07/2014   MICROALBUR 1.74 08/09/2013     Lipid Panel ( most recent) Lipid Panel     Component Value Date/Time   CHOL 191 08/31/2021 1525   TRIG 186 (H) 08/31/2021 1525   HDL 44 08/31/2021 1525   CHOLHDL 4.3 08/31/2021 1525   CHOLHDL 3.3 05/07/2014 1122   VLDL 24 05/07/2014 1122   LDLCALC 114 (H) 08/31/2021 1525          MRI of abdomen on May 04, 2017:  Normal right adrenal gland. Left adrenal nodule on the order of 1.5 cm is consistent with an adenoma.   Surveillance abdominal MRI from December 25, 2019 IMPRESSION: 1. Motion degraded exam shows multiple tiny cystic lesions in the pancreas. Dominant lesion has minimally increased from 14 mm to 17 mm in the 2.5 year interval since previous MRI. No gross soft tissue component or abnormal enhancement evident on today's markedly motion degraded exam. At 17 mm, consensus guidelines recommend repeat imaging every 6 months for 2 years to ensure stability. This recommendation follows ACR consensus guidelines: Management of Incidental Pancreatic Cysts: A White Paper of the ACR Incidental Findings Committee. [place] 5462;70:350-093. 2. Additional tiny 6 mm cystic foci in the body/tail region are new. Attention on follow-up recommended. 3. Abnormal fluid in the endometrial cavity of the uterus. Pelvic ultrasound for further evaluation. 4. Stable left adrenal adenoma. 5. Small hiatal hernia.   MRI of abdomen/pelvis on July 15, 2020 IMPRESSION: Motion degraded images.   16 mm unilocular pancreatic cyst, unchanged. This favors a benign pseudocyst  or side branch IPMN and is of questionable clinical significance. Consider follow-up MR or CT abdomen with/without contrast in 1 year, as clinically warranted. (MR is generally favored,  but CT may be preferred given motion concerns).   Suspected endometrial soft tissue/thickening, incompletely visualized/evaluated. This continues to raise concern for early endometrial neoplasm. GYN consultation is suggested.   These results will be called to the ordering clinician or representative by the Radiologist Assistant, and communication documented in the PACS or [employer].  Assessment & Plan:   1.  Type 2 diabetes-  Based on her presentation with controlled glycemic profile and recent A1c of 7.3%, she will not need additional or prandial insulin for now.     She is advised to continue Levemir 46 units nightly associated with monitoring of blood glucose continuously using her CGM device.  -She hesitates to engage with lifestyle medicine.  That would have given her a chance for controlling diabetes with less medications.  She is advised to call clinic for blood glucose readings less than 70 or greater than 200 mg per DL x3.   It appears that she was taken off of Jardiance since her last visit.   Patient stands to benefit from lifestyle medicine, however she states that whole food , plant-based diet will not work for her.    - she acknowledges that there is a room for improvement in her food and drink choices. - Suggestion is made for her to avoid simple carbohydrates  from her diet including Cakes, Sweet Desserts, Ice Cream, Soda (diet and regular), Sweet Tea, Candies, Chips, Cookies, Store Bought Juices, Alcohol , Artificial Sweeteners,  Coffee Creamer, and "Sugar-free" Products, Lemonade. This will help patient to have more stable blood glucose profile and potentially avoid unintended weight gain.  The following Lifestyle Medicine recommendations according to [place]  [facility]) were discussed and and offered to patient and she  agrees to start the journey:  A. Whole Foods, Plant-Based Nutrition comprising of fruits and vegetables, plant-based  proteins, whole-grain carbohydrates was discussed in detail with the patient.   A list for source of those nutrients were also provided to the patient.  Patient will use only water or unsweetened tea for hydration. B.  The need to stay away from risky substances including alcohol, smoking; obtaining 7 to 9 hours of restorative sleep, at least 150 minutes of moderate intensity exercise weekly, the importance of healthy social connections,  and stress management techniques were discussed. C.  A full color page of  Calorie density of various food groups per pound showing examples of each food groups was provided to the patient.    2.  Adenoma of left adrenal gland I reviewed her interval labs, imaging studies with her and her daughter in the exam room.    The left adrenal gland is reported to have mild thickening, right adrenal gland is normal.    -Her previsit plasma metanephrines are within normal limits.   -She has previously undergone 24-hour urine studies for catecholamines, metanephrines, cortisol, and aldosterone, which are all within normal limits.  -This is consistent with nonfunctioning left adrenal adenoma of 1.5 cm, stable on imaging studies.   -She will not require any specific treatment at this time. -Related to her pancreatic cysts/pseudocysts she is scheduled to have MRI every 6 months, will use those  imaging studies to follow-up on the adrenal adenoma.      3.  Hypothyroidism Her previsit thyroid function tests are consistent with  appropriate replacement.  She is advised to continue levothyroxine 125 mcg p.o. daily before breakfast.    - We discussed about the correct intake of her thyroid hormone, on empty stomach at fasting, with water, separated by at least 30 minutes from breakfast and other medications,  and separated by more than 4 hours from calcium, iron, multivitamins, acid reflux medications (PPIs). -Patient is made aware of the fact that thyroid hormone replacement is  needed for life, dose to be adjusted by periodic monitoring of thyroid function tests.  For Osteoporosis: she was previously treated with IV reclast but wishes to avoid it. She was considered for Prolia during her last visit, however she did not afford it.  She is waiting for her new insurance to kick in beginning of the new year and she will call back.     - I advised patient to maintain close follow up with Kathyrn Drown, MD for primary care needs.  I spent 26 minutes in the care of the patient today including review of labs from [facility] [place], Lipids, Thyroid Function, Hematology (current and previous including abstractions from other facilities); face-to-face time discussing  her blood glucose readings/logs, discussing hypoglycemia and hyperglycemia episodes and symptoms, medications doses, her options of short and long term treatment based on the latest standards of care / guidelines;  discussion about incorporating lifestyle medicine;  and documenting the encounter. Risk reduction counseling performed per USPSTF guidelines to reduce  obesity and cardiovascular risk factors.     Please refer to Patient Instructions for Blood Glucose Monitoring and Insulin/Medications Dosing Guide"  in media tab for additional information. Please  also refer to " Patient Self Inventory" in the Media  tab for reviewed elements of pertinent patient history.  [name] participated in the discussions, expressed understanding, and voiced agreement with the above plans.  All questions were answered to her satisfaction. she is encouraged to contact clinic should she have any questions or concerns prior to her return visit.     Follow up plan: Return in about 6 months (around 08/26/2022) for F/U with Pre-visit Labs, Meter/CGM/Logs, A1c here.   Glade Lloyd, MD [facility] [address] [place], [place] [address] Phone: [phone]  Fax: [phone]      02/25/2022, 7:02 PM  This note was partially dictated with voice recognition software. Similar sounding words can be transcribed inadequately or may not  be corrected upon review.

## 2022-02-25 NOTE — Patient Instructions (Signed)

## 2022-03-12 ENCOUNTER — Other Ambulatory Visit: Payer: Self-pay | Admitting: "Endocrinology

## 2022-03-15 ENCOUNTER — Ambulatory Visit (HOSPITAL_COMMUNITY): Payer: HMO

## 2022-04-02 ENCOUNTER — Ambulatory Visit (INDEPENDENT_AMBULATORY_CARE_PROVIDER_SITE_OTHER): Payer: PPO | Admitting: Family Medicine

## 2022-04-02 ENCOUNTER — Ambulatory Visit: Payer: PPO | Admitting: Family Medicine

## 2022-04-02 VITALS — BP 140/80 | HR 73 | Temp 98.3°F | Ht 59.0 in | Wt 220.0 lb

## 2022-04-02 DIAGNOSIS — J989 Respiratory disorder, unspecified: Secondary | ICD-10-CM | POA: Diagnosis not present

## 2022-04-02 MED ORDER — ALBUTEROL SULFATE HFA 108 (90 BASE) MCG/ACT IN AERS: 2.0000 | INHALATION_SPRAY | RESPIRATORY_TRACT | 4 refills | Status: DC | PRN

## 2022-04-02 NOTE — Progress Notes (Unsigned)
   Subjective:    Patient ID: Kristen Mack, female    DOB: 27-Sep-1945, 77 y.o.   MRN: [id number]  HPI Cough, chest congestion, wheezing, chills, body aches, no fevers Symptoms since 12/27 Viral-like illness multiple days now starting to do better.  Wanted refill of albuterol.  Review of Systems     Objective:   Physical Exam Gen-NAD not toxic TMS-normal bilateral T- normal no redness Chest-CTA respiratory rate normal no crackles CV RRR no murmur Skin-warm dry Neuro-grossly normal        Assessment & Plan:  Minimal wheeze Albuterol prescribed Follow-up if progressive troubles No antibiotic indicated COVID swab taken Upper respiratory virus with bronchial involvement

## 2022-04-05 ENCOUNTER — Institutional Professional Consult (permissible substitution): Payer: HMO | Admitting: Pulmonary Disease

## 2022-04-05 LAB — NOVEL CORONAVIRUS, NAA: SARS-CoV-2, NAA: NOT DETECTED

## 2022-04-13 DIAGNOSIS — E1122 Type 2 diabetes mellitus with diabetic chronic kidney disease: Secondary | ICD-10-CM | POA: Diagnosis not present

## 2022-04-14 ENCOUNTER — Ambulatory Visit (HOSPITAL_COMMUNITY): Payer: PPO

## 2022-04-15 ENCOUNTER — Encounter: Payer: Self-pay | Admitting: Family Medicine

## 2022-04-15 ENCOUNTER — Other Ambulatory Visit: Payer: Self-pay | Admitting: Family Medicine

## 2022-04-20 NOTE — Progress Notes (Unsigned)
Cardiology Office Note:   Date:  04/22/2022  NAME:  Kristen Mack    MRN: [id number] DOB:  1945/06/16   PCP:  Kathyrn Drown, MD  Cardiologist:  Evalina Field, MD  Electrophysiologist:  None   Referring MD: Kathyrn Drown, MD   Chief Complaint  Patient presents with   Follow-up        History of Present Illness:   Kristen Mack is a 77 y.o. female with a hx of PVC, bradycardia, HLD, HTN who presents for follow-up.  Seen in the hospital for low energy in September [facility].  Echo was normal.  No significant bradycardia found.  She reports she is still having low energy.  She is going to be reevaluated for sleep apnea next week.  Her blood pressure slightly elevated but within limits.  She denies any chest pain or trouble breathing.  She does report balance issues.  No recent TSH.  She was slightly anemic in [facility].  I would recommend to recheck this.  We will recheck labs today.  Her echo at [facility] was normal.  No signs of heart failure.  Problem List 1. Diabetes -A1c 7.3 -T chol 191, HDL 44, LDL 114, TG 186 2. Hypothyroidism  3. CKD 3 4. CVA 5. OSA 6. PVCs  -22% burden -in setting of iatrogenic hyperthyroidism  -EF 55-60%  Past Medical History: Past Medical History:  Diagnosis Date   ADHD (attention deficit hyperactivity disorder) 2007   Adrenal adenoma, left    followed by dr nida   Arthritis    Asthma    followed by pcp   CFS (chronic fatigue syndrome)    CKD (chronic kidney disease), stage III [facility])    nephrologist---  dr Joelyn Oms   Depression    Edema of both lower extremities    Fatty liver disease, nonalcoholic    Fibromyalgia    GERD (gastroesophageal reflux disease)    Hiatal hernia    History of adenomatous polyp of colon    History of cardiac murmur    until age 29- pt has had echocardiograms   History of TIA (transient ischemic attack)    per CT scan    Hyperlipidemia    Hypertension    Hypothyroidism     endocrinologist-- dr g. nida   IBS (irritable bowel syndrome)    Meniere disease    OSA on CPAP    per pt uses nightly   Osteopenia    Pancreas cyst    followed by pcp   PMB (postmenopausal bleeding)    PVC's (premature ventricular contractions)    cardiologist--- dr Audie Box   Type 2 diabetes mellitus treated with insulin [facility])    endocrinologist-- dr g. nida;    (08-14-2021  per pt checks blood sugar QID ,  fasting average --- 73-150)   Urinary incontinence    Wears glasses    Wears partial dentures    upper    Past Surgical History: Past Surgical History:  Procedure Laterality Date   CERVICAL POLYPECTOMY N/A 08/17/2021   Procedure: BIOPSY OF ENDOMETRIUM AND OR POLYPECTOMY;  Surgeon: Sanjuana Kava, MD;  Location: [place];  Service: Gynecology;  Laterality: N/A;   [place]   and  REPAIR UMBILICAL HERNIA   COLONOSCOPY N/A 04/19/2013   Procedure: COLONOSCOPY;  Surgeon: Rogene Houston, MD;  [facility];  Service: Endoscopy;  Laterality: N/A;  DILITATION & CURRETTAGE/HYSTROSCOPY WITH NOVASURE ABLATION N/A 08/17/2021   Procedure: DILATATION & CURETTAGE/HYSTEROSCOPY;  Surgeon: Sanjuana Kava, MD;  Location: [place];  Service: Gynecology;  Laterality: N/A;   ESOPHAGOGASTRODUODENOSCOPY  02/08/2003   HERNIA REPAIR  1995   HYSTEROSCOPY WITH D & C  11/14/2002   endometrial polypectomy   HYSTEROSCOPY WITH D & C N/A 04/05/2014   Procedure: DILATATION AND CURETTAGE /HYSTEROSCOPY, ENDOMETRIAL POLYPECTOMY;  Surgeon: Sanjuana Kava, MD;  Location: [place];  Service: Gynecology;  Laterality: N/A;   LIVER BIOPSY  01/29/2004   benign   TUBAL LIGATION  1979    Current Medications: Current Meds  Medication Sig   albuterol (PROVENTIL) (2.5 MG/3ML) 0.083% nebulizer solution Take 3 mLs (2.5 mg total) by nebulization every 6 (six) hours as needed for wheezing or shortness of breath.    albuterol (VENTOLIN HFA) 108 (90 Base) MCG/ACT inhaler Inhale 2 puffs into the lungs every 4 (four) hours as needed. INHALE 2 PUFFS INTO LUNGS EVERY 6 HOURS AS NEEDED FOR SHORTNESS OF BREATH   Azelastine HCl 137 MCG/SPRAY SOLN USE 2 SPRAY(S) IN EACH NOSTRIL TWICE DAILY   B Complex Vitamins (B COMPLEX 100 PO) Take 1 tablet by mouth daily.   blood glucose meter kit and supplies Dispense based on patient and insurance preference. Use to test sugars BID  (FOR ICD-10 E10.9, E11.9).   buPROPion (WELLBUTRIN XL) 300 MG 24 hr tablet Take 1 tablet (300 mg total) by mouth daily.   Chromium Picolinate 1000 MCG TABS Take 1 tablet by mouth daily.   Coenzyme Q10 (COQ10) 200 MG CAPS Take 200 mg by mouth daily.   Continuous Blood Gluc Receiver (FREESTYLE LIBRE 2 READER) DEVI As directed   Continuous Blood Gluc Sensor (FREESTYLE LIBRE 2 SENSOR) MISC 1 Piece by Does not apply route every 14 (fourteen) days.   Cyanocobalamin (B-12) 1000 MCG SUBL Place 1,000 mcg under the tongue daily.   DIGESTIVE ENZYMES PO Take 1 tablet by mouth 3 (three) times daily.    gabapentin (NEURONTIN) 100 MG capsule Take 1 capsule by mouth twice daily   glucose blood test strip Use to check blood glucose four times daily as instructed   LEVEMIR FLEXPEN 100 UNIT/ML FlexPen INJECT 50 UNITS SUBCUTANEOUSLY ONCE DAILY AT BEDTIME (Patient taking differently: 46 Units at bedtime.)   levothyroxine (SYNTHROID) 125 MCG tablet TAKE 1 TABLET BY MOUTH ONCE DAILY BEFORE BREAKFAST   Lysine HCl 500 MG TABS Take 500 mg by mouth daily.   Magnesium 250 MG TABS Take 250 mg by mouth 2 (two) times daily.   meclizine (ANTIVERT) 25 MG tablet Take 25 mg by mouth 3 (three) times daily as needed for dizziness.   montelukast (SINGULAIR) 10 MG tablet TAKE 1 TABLET BY MOUTH AT BEDTIME   OVER THE COUNTER MEDICATION Take 1 Scoop by mouth 3 (three) times daily with meals. Fennel seeds   OVER THE COUNTER MEDICATION Choline and inisotol   rosuvastatin (CRESTOR) 5 MG tablet  TAKE 1 TABLET BY MOUTH ON MONDAY, WEDNESDAY,  FRIDAY AND SATURDAY   spironolactone (ALDACTONE) 25 MG tablet Take 0.5 tablets (12.5 mg total) by mouth daily.   TART CHERRY PO Take 1 tablet by mouth 3 (three) times daily.   tizanidine (ZANAFLEX) 2 MG capsule 1 qhs prn caution drowsiness   Vitamin A 3 MG (10000 UT) TABS Take 10,000 Units by mouth daily.   vitamin E 180 MG (400 UNITS) capsule Take 800 Units by mouth daily.  Allergies:    Latex, Ambien [zolpidem tartrate], Cefzil [cefprozil], Citalopram, Codeine, Demerol [meperidine], Formaldehyde, Sulfa antibiotics, Zolpidem, and Celexa [citalopram hydrobromide]   Social History: Social History   Socioeconomic History   Marital status: Married    Spouse name: Herbie Baltimore   Number of children: 2   Years of education: Not on file   Highest education level: Not on file  Occupational History   Occupation: retired, Therapist, sports  Tobacco Use   Smoking status: Former    Packs/day: 1.50    Years: 3.00    Total pack years: 4.50    Types: Cigarettes    Quit date: 10/03/1968    Years since quitting: 53.5   Smokeless tobacco: Never  Vaping Use   Vaping Use: Never used  Substance and Sexual Activity   Alcohol use: No   Drug use: No   Sexual activity: Not Currently    Birth control/protection: None  Other Topics Concern   Not on file  Social History Narrative   2 daughters, Amy and Sharyn Lull. Amy lives with parents. Sharyn Lull lives in [place].   Social Determinants of Health   Financial Resource Strain: Medium Risk (01/05/2022)   Overall Financial Resource Strain (CARDIA)    Difficulty of Paying Living Expenses: Somewhat hard  Food Insecurity: No Food Insecurity (01/05/2022)   Hunger Vital Sign    Worried About Running Out of Food in the Last Year: Never true    Ran Out of Food in the Last Year: Never true  Transportation Needs: No Transportation Needs (01/05/2022)   PRAPARE - Hydrologist (Medical): No    Lack of  Transportation (Non-Medical): No  Physical Activity: Inactive (01/05/2022)   Exercise Vital Sign    Days of Exercise per Week: 0 days    Minutes of Exercise per Session: 0 min  Stress: No Stress Concern Present (01/05/2022)   [place]    Feeling of Stress : Only a little  Social Connections: Moderately Isolated (01/05/2022)   Social Connection and Isolation Panel [NHANES]    Frequency of Communication with Friends and Family: More than three times a week    Frequency of Social Gatherings with Friends and Family: Never    Attends Religious Services: Never    Marine scientist or Organizations: No    Attends Music therapist: Never    Marital Status: Married     Family History: The patient's family history includes ADD / ADHD in her daughter and daughter; Alcohol abuse in her maternal uncle and paternal uncle; Anxiety disorder in her sister; Breast cancer in her maternal aunt and paternal aunt; Cancer in her brother, maternal aunt, maternal aunt, mother, paternal aunt, paternal aunt, paternal aunt, and paternal aunt; Depression in her brother, daughter, daughter, father, maternal uncle, mother, paternal uncle, and sister; Diabetes in her brother, father, and mother; Drug abuse in her brother, maternal uncle, and paternal uncle; Early death in her brother, maternal uncle, and mother; Hearing loss in her maternal grandfather; Heart disease in her maternal grandmother, maternal uncle, and paternal grandfather; Hyperlipidemia in her brother, brother, father, maternal uncle, paternal grandfather, sister, and sister; Hypertension in her father, maternal grandfather, maternal grandmother, maternal uncle, maternal uncle, mother, and paternal grandfather; Liver disease in her sister; Miscarriages / Stillbirths in her sister; Obesity in her maternal grandmother, mother, and paternal grandmother; Ovarian cancer (age of onset:  71) in her mother; Rectal cancer (age of onset: 100) in  her brother; Stroke in her father and paternal grandfather. There is no history of Pancreatic cancer or Esophageal cancer.  ROS:   All other ROS reviewed and negative. Pertinent positives noted in the HPI.     EKGs/Labs/Other Studies Reviewed:   The following studies were personally reviewed by me today:  TTE 12/12/2021  1. Left ventricular ejection fraction, by estimation, is 60 to 65%. The  left ventricle has normal function. The left ventricle has no regional  wall motion abnormalities. There is moderate asymmetric left ventricular  hypertrophy of the basal segment.  Left ventricular diastolic parameters are consistent with Grade II  diastolic dysfunction (pseudonormalization).   2. Right ventricular systolic function is normal. The right ventricular  size is normal. There is normal pulmonary artery systolic pressure. The  estimated right ventricular systolic pressure is 98.1 mmHg.   3. Left atrial size was mild to moderately dilated.   4. The mitral valve is grossly normal. Mild mitral valve regurgitation.   5. The aortic valve is tricuspid. Aortic valve regurgitation is not  visualized. Aortic valve sclerosis/calcification is present, without any  evidence of aortic stenosis. Aortic valve mean gradient measures 10.7  mmHg.   6. Aortic dilatation noted. There is mild dilatation of the ascending  aorta, measuring 37 mm.   7. The inferior vena cava is normal in size with greater than 50%  respiratory variability, suggesting right atrial pressure of 3 mmHg.   Recent Labs: 12/11/2021: ALT 25; B Natriuretic Peptide 380.0 12/12/2021: Hemoglobin 10.9; Platelets 361 01/22/2022: TSH 2.980 02/09/2022: BUN 22; Creatinine, Ser 1.38; Magnesium 1.9; Potassium 3.6; Sodium 143   Recent Lipid Panel    Component Value Date/Time   CHOL 191 08/31/2021 1525   TRIG 186 (H) 08/31/2021 1525   HDL 44 08/31/2021 1525   CHOLHDL 4.3 08/31/2021 1525    CHOLHDL 3.3 05/07/2014 1122   VLDL 24 05/07/2014 1122   LDLCALC 114 (H) 08/31/2021 1525    Physical Exam:   VS:  BP (!) 148/80   Pulse 70   Ht '4\' 11"'$  (1.499 m)   Wt 222 lb (100.7 kg)   SpO2 98%   BMI 44.84 kg/m    Wt Readings from Last 3 Encounters:  04/22/22 222 lb (100.7 kg)  04/02/22 220 lb (99.8 kg)  02/25/22 221 lb 12.8 oz (100.6 kg)    General: Well nourished, well developed, in no acute distress Head: Atraumatic, normal size  Eyes: PEERLA, EOMI  Neck: Supple, no JVD Endocrine: No thryomegaly Cardiac: Normal S1, S2; RRR; no murmurs, rubs, or gallops Lungs: Clear to auscultation bilaterally, no wheezing, rhonchi or rales  Abd: Soft, nontender, no hepatomegaly  Ext: No edema, pulses 2+ Musculoskeletal: No deformities, BUE and BLE strength normal and equal Skin: Warm and dry, no rashes   Neuro: Alert and oriented to person, place, time, and situation, CNII-XII grossly intact, no focal deficits  Psych: Normal mood and affect   ASSESSMENT:   Kristen Mack is a 77 y.o. female who presents for the following: 1. PVC (premature ventricular contraction)   2. Primary hypertension   3. Bradycardia   4. Other fatigue     PLAN:   1. PVC (premature ventricular contraction) -Diagnosed with frequent PVCs in the setting of iatrogenic hyperthyroidism.  No PVCs on examination today.  Her thyroid medication has been adjusted.  She seems to be doing well.  Echo was normal.  2. Primary hypertension -Continue current medications.  3. Bradycardia -No signs of cardiac today.  Suspect this was related to PVCs in the past.  4. Other fatigue -She reports low energy.  I would like to check a CMP, CBC and TSH.  She reports feeling this way when her potassium is low.  Her hemoglobin was also noted to be a bit low at [facility].  I wonder if this is contributing.  She has no signs of heart failure.  Her exam is normal.  Recent echo was normal.  No PVCs on examination.  Suspect this  is possibly related to deconditioning if above workup is negative.  I also asked her to make sure she follows up with her sleep specialist regarding sleep apnea treatment.  This could be contributing.     Disposition: Return in about 1 year (around 04/23/2023).  Medication Adjustments/Labs and Tests Ordered: Current medicines are reviewed at length with the patient today.  Concerns regarding medicines are outlined above.  Orders Placed This Encounter  Procedures   CBC   Comprehensive metabolic panel   TSH   No orders of the defined types were placed in this encounter.   Patient Instructions  Medication Instructions:  The current medical regimen is effective;  continue present plan and medications.  *If you need a refill on your cardiac medications before your next appointment, please call your pharmacy*   Lab Work: CMET, CBC, TSH today   If you have labs (blood work) drawn today and your tests are completely normal, you will receive your results only by: [place] (if you have MyChart) OR A paper copy in the mail If you have any lab test that is abnormal or we need to change your treatment, we will call you to review the results.   Follow-Up: At [facility], you and your health needs are our priority.  As part of our continuing mission to provide you with exceptional heart care, we have created designated Provider Care Teams.  These Care Teams include your primary Cardiologist (physician) and Advanced Practice Providers (APPs -  Physician Assistants and Nurse Practitioners) who all work together to provide you with the care you need, when you need it.  We recommend signing up for the patient portal called "MyChart".  Sign up information is provided on this After Visit Summary.  MyChart is used to connect with patients for Virtual Visits (Telemedicine).  Patients are able to view lab/test results, encounter notes, upcoming appointments, etc.  Non-urgent messages can  be sent to your provider as well.   To learn more about what you can do with MyChart, go to [email or web address].    Your next appointment:   12 month(s)  Provider:   Evalina Field, MD       Time Spent with Patient: I have spent a total of 35 minutes with patient reviewing hospital notes, telemetry, EKGs, labs and examining the patient as well as establishing an assessment and plan that was discussed with the patient.  > 50% of time was spent in direct patient care.  Signed, Addison Naegeli. Audie Box, MD, [place]  [address], [place], [place] [address] [phone]  04/22/2022 4:27 PM

## 2022-04-22 ENCOUNTER — Ambulatory Visit: Payer: PPO | Attending: Cardiovascular Disease | Admitting: Cardiovascular Disease

## 2022-04-22 ENCOUNTER — Encounter: Payer: Self-pay | Admitting: Cardiovascular Disease

## 2022-04-22 VITALS — BP 148/80 | HR 70 | Ht 59.0 in | Wt 222.0 lb

## 2022-04-22 DIAGNOSIS — I10 Essential (primary) hypertension: Secondary | ICD-10-CM

## 2022-04-22 DIAGNOSIS — I493 Ventricular premature depolarization: Secondary | ICD-10-CM

## 2022-04-22 DIAGNOSIS — R5383 Other fatigue: Secondary | ICD-10-CM | POA: Diagnosis not present

## 2022-04-22 DIAGNOSIS — R001 Bradycardia, unspecified: Secondary | ICD-10-CM | POA: Diagnosis not present

## 2022-04-22 NOTE — Patient Instructions (Signed)
Medication Instructions:  The current medical regimen is effective;  continue present plan and medications.  *If you need a refill on your cardiac medications before your next appointment, please call your pharmacy*   Lab Work: CMET, CBC, TSH today   If you have labs (blood work) drawn today and your tests are completely normal, you will receive your results only by: [place] (if you have MyChart) OR A paper copy in the mail If you have any lab test that is abnormal or we need to change your treatment, we will call you to review the results.   Follow-Up: At [facility], you and your health needs are our priority.  As part of our continuing mission to provide you with exceptional heart care, we have created designated Provider Care Teams.  These Care Teams include your primary Cardiologist (physician) and Advanced Practice Providers (APPs -  Physician Assistants and Nurse Practitioners) who all work together to provide you with the care you need, when you need it.  We recommend signing up for the patient portal called "MyChart".  Sign up information is provided on this After Visit Summary.  MyChart is used to connect with patients for Virtual Visits (Telemedicine).  Patients are able to view lab/test results, encounter notes, upcoming appointments, etc.  Non-urgent messages can be sent to your provider as well.   To learn more about what you can do with MyChart, go to [email or web address].    Your next appointment:   12 month(s)  Provider:   Evalina Field, MD

## 2022-04-23 LAB — COMPREHENSIVE METABOLIC PANEL
ALT: 18 IU/L (ref 0–32)
AST: 16 IU/L (ref 0–40)
Albumin/Globulin Ratio: 1.3 (ref 1.2–2.2)
Albumin: 4 g/dL (ref 3.8–4.8)
Alkaline Phosphatase: 106 IU/L (ref 44–121)
BUN/Creatinine Ratio: 11 — ABNORMAL LOW (ref 12–28)
BUN: 18 mg/dL (ref 8–27)
Bilirubin Total: 0.5 mg/dL (ref 0.0–1.2)
CO2: 16 mmol/L — ABNORMAL LOW (ref 20–29)
Calcium: 9.1 mg/dL (ref 8.7–10.3)
Chloride: 107 mmol/L — ABNORMAL HIGH (ref 96–106)
Creatinine, Ser: 1.58 mg/dL — ABNORMAL HIGH (ref 0.57–1.00)
Globulin, Total: 3.2 g/dL (ref 1.5–4.5)
Glucose: 107 mg/dL — ABNORMAL HIGH (ref 70–99)
Potassium: 4 mmol/L (ref 3.5–5.2)
Sodium: 141 mmol/L (ref 134–144)
Total Protein: 7.2 g/dL (ref 6.0–8.5)
eGFR: 34 mL/min/{1.73_m2} — ABNORMAL LOW (ref >59)

## 2022-04-23 LAB — TSH: TSH: 3.16 u[IU]/mL (ref 0.450–4.500)

## 2022-04-23 LAB — CBC
Hematocrit: 37.9 % (ref 34.0–46.6)
Hemoglobin: 12.2 g/dL (ref 11.1–15.9)
MCH: 27 pg (ref 26.6–33.0)
MCHC: 32.2 g/dL (ref 31.5–35.7)
MCV: 84 fL (ref 79–97)
Platelets: 360 10*3/uL (ref 150–450)
RBC: 4.52 x10E6/uL (ref 3.77–5.28)
RDW: 14.2 % (ref 11.7–15.4)
WBC: 11.7 10*3/uL — ABNORMAL HIGH (ref 3.4–10.8)

## 2022-04-24 ENCOUNTER — Other Ambulatory Visit: Payer: Self-pay | Admitting: "Endocrinology

## 2022-04-26 ENCOUNTER — Ambulatory Visit (INDEPENDENT_AMBULATORY_CARE_PROVIDER_SITE_OTHER): Payer: PPO | Admitting: Family Medicine

## 2022-04-26 ENCOUNTER — Encounter: Payer: Self-pay | Admitting: Family Medicine

## 2022-04-26 VITALS — BP 132/70 | Wt 220.8 lb

## 2022-04-26 DIAGNOSIS — E785 Hyperlipidemia, unspecified: Secondary | ICD-10-CM | POA: Diagnosis not present

## 2022-04-26 DIAGNOSIS — N1832 Chronic kidney disease, stage 3b: Secondary | ICD-10-CM

## 2022-04-26 DIAGNOSIS — F331 Major depressive disorder, recurrent, moderate: Secondary | ICD-10-CM

## 2022-04-26 DIAGNOSIS — G4733 Obstructive sleep apnea (adult) (pediatric): Secondary | ICD-10-CM | POA: Diagnosis not present

## 2022-04-26 DIAGNOSIS — E1169 Type 2 diabetes mellitus with other specified complication: Secondary | ICD-10-CM | POA: Diagnosis not present

## 2022-04-26 DIAGNOSIS — I129 Hypertensive chronic kidney disease with stage 1 through stage 4 chronic kidney disease, or unspecified chronic kidney disease: Secondary | ICD-10-CM

## 2022-04-26 DIAGNOSIS — E1122 Type 2 diabetes mellitus with diabetic chronic kidney disease: Secondary | ICD-10-CM | POA: Diagnosis not present

## 2022-04-26 MED ORDER — ROSUVASTATIN CALCIUM 5 MG PO TABS: ORAL_TABLET | ORAL | 5 refills | Status: DC

## 2022-04-26 NOTE — Progress Notes (Signed)
Subjective:    Patient ID: Kristen Mack, female    DOB: [birth year], 77 y.o.   MRN: [id number]  Diabetes She presents for her follow-up diabetic visit. She has type 2 diabetes mellitus. There are no hypoglycemic associated symptoms. There are no diabetic associated symptoms. There are no diabetic complications. Risk factors for coronary artery disease include dyslipidemia. She does not see a podiatrist.Eye exam is current ([place]).   Moods seem to be doing all right Diabetes endocrinology is following as well as thyroid Doing well with cholesterol Moderate episode of recurrent major depressive disorder (HCC)  Stage 3b chronic kidney disease ([place])  Type 2 DM with CKD stage 3 and hypertension ([place]), Chronic  Morbid obesity ([place]), Chronic  Hyperlipidemia associated with type 2 diabetes mellitus ([place]) Results for orders placed or performed in visit on 04/22/22  CBC  Result Value Ref Range   WBC 11.7 (H) 3.4 - 10.8 x10E3/uL   RBC 4.52 3.77 - 5.28 x10E6/uL   Hemoglobin 12.2 11.1 - 15.9 g/dL   Hematocrit 37.9 34.0 - 46.6 %   MCV 84 79 - 97 fL   MCH 27.0 26.6 - 33.0 pg   MCHC 32.2 31.5 - 35.7 g/dL   RDW 14.2 11.7 - 15.4 %   Platelets 360 150 - 450 x10E3/uL  Comprehensive metabolic panel  Result Value Ref Range   Glucose 107 (H) 70 - 99 mg/dL   BUN 18 8 - 27 mg/dL   Creatinine, Ser 1.58 (H) 0.57 - 1.00 mg/dL   eGFR 34 (L) >59 mL/min/1.73   BUN/Creatinine Ratio 11 (L) 12 - 28   Sodium 141 134 - 144 mmol/L   Potassium 4.0 3.5 - 5.2 mmol/L   Chloride 107 (H) 96 - 106 mmol/L   CO2 16 (L) 20 - 29 mmol/L   Calcium 9.1 8.7 - 10.3 mg/dL   Total Protein 7.2 6.0 - 8.5 g/dL   Albumin 4.0 3.8 - 4.8 g/dL   Globulin, Total 3.2 1.5 - 4.5 g/dL   Albumin/Globulin Ratio 1.3 1.2 - 2.2   Bilirubin Total 0.5 0.0 - 1.2 mg/dL   Alkaline Phosphatase 106 44 - 121 IU/L   AST 16 0 - 40 IU/L   ALT 18 0 - 32 IU/L  TSH  Result Value Ref Range   TSH 3.160 0.450 -  4.500 uIU/mL   Reported function looks good, CBC looks good, white blood count minimally elevated will monitor-previous A1c 7.34 months ago but will be following up with endocrinology near future CKD 3b noted patient staying away from NSAIDs liver times look good. Note from cardiology reviewed Note from endocrinology reviewed Kidney specialist note was reviewed they will be seeing her in the near future Review of Systems     Objective:   Physical Exam General-in no acute distress Eyes-no discharge Lungs-respiratory rate normal, CTA CV-no murmurs,RRR Extremities skin warm dry no edema Neuro grossly normal Behavior normal, alert Diabetic foot exam today normal. Refills of the medications that we control were completed today Cholesterol medicine refilled Patient sciatica is doing well on the gabapentin-not having any drowsiness with the gabapentin continue this currently No weakness in the leg currently no need to do any imaging at this time    Assessment & Plan:  1. Moderate episode of recurrent major depressive disorder (HCC) On medications doing okay currently  2. Stage 3b chronic kidney disease ([place]) Followed by nephrologist stable lab work  3. Type 2 DM with CKD stage 3 and hypertension (  [place]) Diabetes followed by endocrinology A1c acceptable for age Will not do urine ACR today but will do this on follow-up visit if not completed by endocrinology or diabetic checkups or nephrology 4. Morbid obesity ([place]) Portion control recommended patient unable to do any sustained exercise because of physical limitations Sciatica-stable-no weakness-continue gabapentin not causing any drowsiness Hyperlipidemia on medication associated with diabetes doing well

## 2022-04-27 ENCOUNTER — Ambulatory Visit (INDEPENDENT_AMBULATORY_CARE_PROVIDER_SITE_OTHER): Payer: PPO | Admitting: Pulmonary Disease

## 2022-04-27 ENCOUNTER — Encounter (HOSPITAL_BASED_OUTPATIENT_CLINIC_OR_DEPARTMENT_OTHER): Payer: Self-pay | Admitting: Pulmonary Disease

## 2022-04-27 VITALS — BP 142/82 | HR 69 | Temp 98.2°F | Ht 59.0 in | Wt 222.4 lb

## 2022-04-27 DIAGNOSIS — R0609 Other forms of dyspnea: Secondary | ICD-10-CM | POA: Diagnosis not present

## 2022-04-27 DIAGNOSIS — G4733 Obstructive sleep apnea (adult) (pediatric): Secondary | ICD-10-CM

## 2022-04-27 NOTE — Patient Instructions (Addendum)
X change auto CPAP 4 to 12 cm  CPAP supplies will be renewed for a year -DME [employer]

## 2022-04-27 NOTE — Assessment & Plan Note (Signed)
Likely related to deconditioning due to sedentary lifestyle.  Unfortunately she uses a walker and would not be eligible for cardiopulmonary rehab until her balance issues improve.  I encouraged her to put more steps and be more active.  Benefit from a PT program but seems like this was limited by back issues

## 2022-04-27 NOTE — Progress Notes (Signed)
Subjective:    Patient ID: Kristen Mack, female    DOB: 11/28/1945, 77 y.o.   MRN: [id number]  HPI  77 year old woman from [place], presents to establish care for OSA. She reports a diagnosis of OSA for many years.  Currently has been on CPAP since 2007.  She had multiple sleep studies done in 2003 at [facility],2004 in [place] Her last study was in 2019 by her PCP which showed moderate OSA. DME [facility].  She is settled down with nasal pillows and is very compliant with her machine.  She is unclear when she got this machine, seems to be around 2019 Epworth sleepiness score is 13 and she reports some sleepiness as a passenger in a car, lying down to rest in the afternoons, sitting and reading or watching TV. She admits to a sedentary lifestyle. Bedtime is between 10 PM and midnight, sleep latency is minimal after she takes gabapentin and muscle relaxant, she sleeps on a wedge on her side, with 1-2 pillows, reports 1-2 nocturnal awakenings and is out of bed latest by 10 AM feeling rested without dryness of mouth or headaches. She has gained 10 pounds in the last 2 years.  There is no history suggestive of cataplexy, sleep paralysis or parasomnias  Been evaluated for dyspnea on exertion and no cause found and this is attributed to deconditioning  PMH -CKD stage III Chronic fatigue Type 2 diabetes on insulin Fibromyalgia  Significant tests/ events reviewed HST 04/2017 - wt 207 lbs -AHI 18/hour, RDI 36/hour, low sat 89% Spirometry 01/28/2022 FEV1 1.31 (82%) Ratio 0.77 s curvature or prior rx   PFTs 04/2017 no airway obstruction, ratio 86, FEV1 90%, FVC 84%  Echo 11/2021 RVSP nml,nml LVEF  Past Medical History:  Diagnosis Date   ADHD (attention deficit hyperactivity disorder) 2007   Adrenal adenoma, left    followed by dr nida   Arthritis    Asthma    followed by pcp   CFS (chronic fatigue syndrome)    CKD (chronic kidney disease), stage III [facility])    nephrologist---   dr Joelyn Oms   Depression    Edema of both lower extremities    Fatty liver disease, nonalcoholic    Fibromyalgia    GERD (gastroesophageal reflux disease)    Hiatal hernia    History of adenomatous polyp of colon    History of cardiac murmur    until age 55- pt has had echocardiograms   History of TIA (transient ischemic attack)    per CT scan    Hyperlipidemia    Hypertension    Hypothyroidism    endocrinologist-- dr g. nida   IBS (irritable bowel syndrome)    Meniere disease    OSA on CPAP    per pt uses nightly   Osteopenia    Pancreas cyst    followed by pcp   PMB (postmenopausal bleeding)    PVC's (premature ventricular contractions)    cardiologist--- dr Audie Box   Type 2 diabetes mellitus treated with insulin [facility])    endocrinologist-- dr g. nida;    (08-14-2021  per pt checks blood sugar QID ,  fasting average --- 73-150)   Urinary incontinence    Wears glasses    Wears partial dentures    upper   Past Surgical History:  Procedure Laterality Date   CERVICAL POLYPECTOMY N/A 08/17/2021   Procedure: BIOPSY OF ENDOMETRIUM AND OR POLYPECTOMY;  Surgeon: Sanjuana Kava, MD;  Location: [facility];  Service: Gynecology;  Laterality: N/A;   [place]   and  REPAIR UMBILICAL HERNIA   COLONOSCOPY N/A 04/19/2013   Procedure: COLONOSCOPY;  Surgeon: Rogene Houston, MD;  Location: [facility];  Service: Endoscopy;  Laterality: N/A;     DILITATION & CURRETTAGE/HYSTROSCOPY WITH NOVASURE ABLATION N/A 08/17/2021   Procedure: DILATATION & CURETTAGE/HYSTEROSCOPY;  Surgeon: Sanjuana Kava, MD;  Location: [facility];  Service: Gynecology;  Laterality: N/A;   ESOPHAGOGASTRODUODENOSCOPY  02/08/2003   HERNIA REPAIR  1995   HYSTEROSCOPY WITH D & C  11/14/2002   endometrial polypectomy   HYSTEROSCOPY WITH D & C N/A 04/05/2014   Procedure: DILATATION AND CURETTAGE /HYSTEROSCOPY, ENDOMETRIAL POLYPECTOMY;  Surgeon: Sanjuana Kava, MD;  Location: [facility];  Service: Gynecology;  Laterality: N/A;   LIVER BIOPSY  01/29/2004   benign   TUBAL LIGATION  1979   Allergies  Allergen Reactions   Latex Other (See Comments) and Itching    "skin gets raw"   Ambien [Zolpidem Tartrate] Other (See Comments)    Side effect "lost a couple days"; memory loss   Cefzil [Cefprozil] Diarrhea   Citalopram     Other reaction(s): Not available   Codeine Itching and Nausea And Vomiting   Demerol [Meperidine] Other (See Comments)    "messes up senses"   Formaldehyde    Sulfa Antibiotics Other (See Comments)    Severe abd cramp   Zolpidem     Other reaction(s): Not available   Celexa [Citalopram Hydrobromide] Other (See Comments)    Mouth sores    Social History   Socioeconomic History   Marital status: Married    Spouse name: Herbie Baltimore   Number of children: 2   Years of education: Not on file   Highest education level: Not on file  Occupational History   Occupation: retired, Therapist, sports  Tobacco Use   Smoking status: Former    Packs/day: 1.50    Years: 3.00    Total pack years: 4.50    Types: Cigarettes    Quit date: 10/03/1968    Years since quitting: 53.6    Passive exposure: Past   Smokeless tobacco: Never  Vaping Use   Vaping Use: Never used  Substance and Sexual Activity   Alcohol use: No   Drug use: No   Sexual activity: Not Currently    Birth control/protection: None  Other Topics Concern   Not on file  Social History Narrative   2 daughters, Amy and Sharyn Lull. Amy lives with parents. Sharyn Lull lives in [place].   Social Determinants of Health   Financial Resource Strain: Medium Risk (01/05/2022)   Overall Financial Resource Strain (CARDIA)    Difficulty of Paying Living Expenses: Somewhat hard  Food Insecurity: No Food Insecurity (01/05/2022)   Hunger Vital Sign    Worried About Running Out of Food in the Last Year: Never true    Ran Out of Food in the Last Year: Never true   Transportation Needs: No Transportation Needs (01/05/2022)   PRAPARE - Hydrologist (Medical): No    Lack of Transportation (Non-Medical): No  Physical Activity: Inactive (01/05/2022)   Exercise Vital Sign    Days of Exercise per Week: 0 days    Minutes of Exercise per Session: 0 min  Stress: No Stress Concern Present (01/05/2022)   [place]    Feeling of Stress :  Only a little  Social Connections: Moderately Isolated (01/05/2022)   Social Connection and Isolation Panel [NHANES]    Frequency of Communication with Friends and Family: More than three times a week    Frequency of Social Gatherings with Friends and Family: Never    Attends Religious Services: Never    Marine scientist or Organizations: No    Attends Archivist Meetings: Never    Marital Status: Married  Human resources officer Violence: Not At Risk (01/05/2022)   Humiliation, Afraid, Rape, and Kick questionnaire    Fear of Current or Ex-Partner: No    Emotionally Abused: No    Physically Abused: No    Sexually Abused: No    Family History  Problem Relation Age of Onset   Rectal cancer Brother 67   Diabetes Brother    Ovarian cancer Mother 49   Hypertension Mother    Diabetes Mother    Cancer Mother    Depression Mother    Early death Mother    Obesity Mother    Hypertension Father    Diabetes Father    Depression Father    Hyperlipidemia Father    Stroke Father    Breast cancer Maternal Aunt    Breast cancer Paternal Aunt        four Paternal aunts   Heart disease Paternal Grandfather    Hyperlipidemia Paternal Grandfather    Hypertension Paternal Grandfather    Stroke Paternal Grandfather    Liver disease Sister        x 2; fatty liver   Hearing loss Maternal Grandfather    Hypertension Maternal Grandfather    Heart disease Maternal Grandmother    Hypertension Maternal Grandmother    Obesity  Maternal Grandmother    Obesity Paternal 83    ADD / ADHD Daughter    Depression Daughter    ADD / ADHD Daughter    Depression Daughter    Alcohol abuse Maternal Uncle    Depression Maternal Uncle    Drug abuse Maternal Uncle    Early death Maternal Uncle    Alcohol abuse Paternal Uncle    Depression Paternal Uncle    Drug abuse Paternal Uncle    Anxiety disorder Sister    Depression Sister    Hyperlipidemia Sister    Miscarriages / Stillbirths Sister    Cancer Brother    Depression Brother    Early death Brother    Cancer Maternal Aunt    Cancer Maternal Aunt    Cancer Paternal Aunt    Cancer Paternal Aunt    Cancer Paternal Aunt    Cancer Paternal Aunt    Drug abuse Brother    Heart disease Maternal Uncle    Hyperlipidemia Maternal Uncle    Hypertension Maternal Uncle    Hyperlipidemia Sister    Hyperlipidemia Brother    Hyperlipidemia Brother    Hypertension Maternal Uncle    Pancreatic cancer Neg Hx    Esophageal cancer Neg Hx      Review of Systems  Shortness of breath with activity Nonproductive cough Regular heartbeat Acid heartburn and indigestion Loss of appetite 2 problems Headaches and nasal congestion Sneezing Anxiety and depression Feet swelling Joint stiffness    Objective:   Physical Exam  Gen. Pleasant, obese, in no distress, normal affect ENT - no pallor,icterus, no post nasal drip, class 2-3 airway Neck: No JVD, no thyromegaly, no carotid bruits Lungs: no use of accessory muscles, no dullness to percussion, decreased without rales  or rhonchi  Cardiovascular: Rhythm regular, heart sounds  normal, no murmurs or gallops, no peripheral edema Abdomen: soft and non-tender, no hepatosplenomegaly, BS normal. Musculoskeletal: No deformities, no cyanosis or clubbing Neuro:  alert, non focal, no tremors       Assessment & Plan:   Hypersomnolence -in spite of adequate compliance on CPAP.  This may be attributed to medication such as  Neurontin sedentary lifestyle.  Doubt that she is a good candidate for stimulants

## 2022-04-27 NOTE — Assessment & Plan Note (Signed)
CPAP download was reviewed which shows excellent control of events on auto CPAP settings 4 to 20 cm with average pressure of 9 and max pressure of 10 cm.  She has mild leak.  She is very compliant about 7 hours per night.  CPAP is only helped with her fatigue and daytime somnolence. I will change to auto settings 5 to 12 cm Weight loss encouraged, compliance with goal of at least 4-6 hrs every night is the expectation. Advised against medications with sedative side effects Cautioned against driving when sleepy - understanding that sleepiness will vary on a day to day basis

## 2022-05-03 DIAGNOSIS — N1832 Chronic kidney disease, stage 3b: Secondary | ICD-10-CM | POA: Diagnosis not present

## 2022-05-03 DIAGNOSIS — R350 Frequency of micturition: Secondary | ICD-10-CM | POA: Diagnosis not present

## 2022-05-03 DIAGNOSIS — I129 Hypertensive chronic kidney disease with stage 1 through stage 4 chronic kidney disease, or unspecified chronic kidney disease: Secondary | ICD-10-CM | POA: Diagnosis not present

## 2022-05-03 DIAGNOSIS — K219 Gastro-esophageal reflux disease without esophagitis: Secondary | ICD-10-CM | POA: Diagnosis not present

## 2022-05-03 DIAGNOSIS — E1122 Type 2 diabetes mellitus with diabetic chronic kidney disease: Secondary | ICD-10-CM | POA: Diagnosis not present

## 2022-05-03 DIAGNOSIS — G4733 Obstructive sleep apnea (adult) (pediatric): Secondary | ICD-10-CM | POA: Diagnosis not present

## 2022-05-03 DIAGNOSIS — K7581 Nonalcoholic steatohepatitis (NASH): Secondary | ICD-10-CM | POA: Diagnosis not present

## 2022-05-14 DIAGNOSIS — E1122 Type 2 diabetes mellitus with diabetic chronic kidney disease: Secondary | ICD-10-CM | POA: Diagnosis not present

## 2022-05-17 ENCOUNTER — Other Ambulatory Visit: Payer: Self-pay | Admitting: "Endocrinology

## 2022-05-17 ENCOUNTER — Other Ambulatory Visit: Payer: Self-pay | Admitting: Family Medicine

## 2022-05-17 DIAGNOSIS — N183 Chronic kidney disease, stage 3 unspecified: Secondary | ICD-10-CM

## 2022-05-27 ENCOUNTER — Encounter: Payer: Self-pay | Admitting: Radiology

## 2022-06-11 ENCOUNTER — Ambulatory Visit (INDEPENDENT_AMBULATORY_CARE_PROVIDER_SITE_OTHER): Payer: PPO

## 2022-06-11 VITALS — BP 132/68 | HR 76 | Ht 59.0 in | Wt 225.6 lb

## 2022-06-11 DIAGNOSIS — M810 Age-related osteoporosis without current pathological fracture: Secondary | ICD-10-CM

## 2022-06-11 MED ORDER — DENOSUMAB 60 MG/ML ~~LOC~~ SOSY
60.0000 mg | PREFILLED_SYRINGE | Freq: Once | SUBCUTANEOUS | Status: AC
  Administered 2022-06-11: 60 mg via SUBCUTANEOUS

## 2022-06-11 NOTE — Progress Notes (Signed)
Pt seen today for nurse visit to receive prolia injection. Pt provided her [place], Lot # [id number], Expiration Date 06/26/2024. Prolia 60mg  injected SQ in upper R arm without difficulty, no reaction noted after pt's 15 minute wait period.

## 2022-06-12 DIAGNOSIS — E1122 Type 2 diabetes mellitus with diabetic chronic kidney disease: Secondary | ICD-10-CM | POA: Diagnosis not present

## 2022-07-12 ENCOUNTER — Other Ambulatory Visit: Payer: Self-pay | Admitting: General Practice

## 2022-07-13 DIAGNOSIS — E1122 Type 2 diabetes mellitus with diabetic chronic kidney disease: Secondary | ICD-10-CM | POA: Diagnosis not present

## 2022-07-19 ENCOUNTER — Other Ambulatory Visit: Payer: Self-pay | Admitting: Family Medicine

## 2022-07-19 MED ORDER — ROSUVASTATIN CALCIUM 5 MG PO TABS: ORAL_TABLET | ORAL | 5 refills | Status: DC

## 2022-07-29 ENCOUNTER — Other Ambulatory Visit: Payer: Self-pay | Admitting: "Endocrinology

## 2022-08-11 ENCOUNTER — Other Ambulatory Visit: Payer: Self-pay | Admitting: "Endocrinology

## 2022-08-11 DIAGNOSIS — N183 Chronic kidney disease, stage 3 unspecified: Secondary | ICD-10-CM

## 2022-08-12 DIAGNOSIS — E1122 Type 2 diabetes mellitus with diabetic chronic kidney disease: Secondary | ICD-10-CM | POA: Diagnosis not present

## 2022-08-17 DIAGNOSIS — G4733 Obstructive sleep apnea (adult) (pediatric): Secondary | ICD-10-CM | POA: Diagnosis not present

## 2022-08-24 DIAGNOSIS — I129 Hypertensive chronic kidney disease with stage 1 through stage 4 chronic kidney disease, or unspecified chronic kidney disease: Secondary | ICD-10-CM | POA: Diagnosis not present

## 2022-08-24 DIAGNOSIS — N183 Chronic kidney disease, stage 3 unspecified: Secondary | ICD-10-CM | POA: Diagnosis not present

## 2022-08-24 DIAGNOSIS — E038 Other specified hypothyroidism: Secondary | ICD-10-CM | POA: Diagnosis not present

## 2022-08-24 DIAGNOSIS — E1122 Type 2 diabetes mellitus with diabetic chronic kidney disease: Secondary | ICD-10-CM | POA: Diagnosis not present

## 2022-08-25 ENCOUNTER — Ambulatory Visit (INDEPENDENT_AMBULATORY_CARE_PROVIDER_SITE_OTHER): Payer: PPO | Admitting: Family Medicine

## 2022-08-25 VITALS — BP 136/86 | HR 76 | Wt 223.0 lb

## 2022-08-25 DIAGNOSIS — M791 Myalgia, unspecified site: Secondary | ICD-10-CM

## 2022-08-25 DIAGNOSIS — T148XXA Other injury of unspecified body region, initial encounter: Secondary | ICD-10-CM

## 2022-08-25 DIAGNOSIS — R233 Spontaneous ecchymoses: Secondary | ICD-10-CM

## 2022-08-25 DIAGNOSIS — N183 Chronic kidney disease, stage 3 unspecified: Secondary | ICD-10-CM

## 2022-08-25 DIAGNOSIS — N1832 Chronic kidney disease, stage 3b: Secondary | ICD-10-CM | POA: Diagnosis not present

## 2022-08-25 DIAGNOSIS — M79605 Pain in left leg: Secondary | ICD-10-CM

## 2022-08-25 DIAGNOSIS — I129 Hypertensive chronic kidney disease with stage 1 through stage 4 chronic kidney disease, or unspecified chronic kidney disease: Secondary | ICD-10-CM | POA: Diagnosis not present

## 2022-08-25 DIAGNOSIS — E1122 Type 2 diabetes mellitus with diabetic chronic kidney disease: Secondary | ICD-10-CM | POA: Diagnosis not present

## 2022-08-25 DIAGNOSIS — I10 Essential (primary) hypertension: Secondary | ICD-10-CM | POA: Diagnosis not present

## 2022-08-25 DIAGNOSIS — R3589 Other polyuria: Secondary | ICD-10-CM

## 2022-08-25 LAB — TSH: TSH: 4.84 u[IU]/mL — ABNORMAL HIGH (ref 0.450–4.500)

## 2022-08-25 LAB — LIPID PANEL
Chol/HDL Ratio: 3.2 ratio (ref 0.0–4.4)
Cholesterol, Total: 134 mg/dL (ref 100–199)
HDL: 42 mg/dL (ref >39)
LDL Chol Calc (NIH): 59 mg/dL (ref 0–99)
Triglycerides: 198 mg/dL — ABNORMAL HIGH (ref 0–149)
VLDL Cholesterol Cal: 33 mg/dL (ref 5–40)

## 2022-08-25 LAB — COMPREHENSIVE METABOLIC PANEL
ALT: 22 IU/L (ref 0–32)
AST: 23 IU/L (ref 0–40)
Albumin/Globulin Ratio: 1.1 — ABNORMAL LOW (ref 1.2–2.2)
Albumin: 3.7 g/dL — ABNORMAL LOW (ref 3.8–4.8)
Alkaline Phosphatase: 63 IU/L (ref 44–121)
BUN/Creatinine Ratio: 14 (ref 12–28)
BUN: 21 mg/dL (ref 8–27)
Bilirubin Total: 0.2 mg/dL (ref 0.0–1.2)
CO2: 20 mmol/L (ref 20–29)
Calcium: 9.5 mg/dL (ref 8.7–10.3)
Chloride: 105 mmol/L (ref 96–106)
Creatinine, Ser: 1.52 mg/dL — ABNORMAL HIGH (ref 0.57–1.00)
Globulin, Total: 3.4 g/dL (ref 1.5–4.5)
Glucose: 132 mg/dL — ABNORMAL HIGH (ref 70–99)
Potassium: 4.2 mmol/L (ref 3.5–5.2)
Sodium: 139 mmol/L (ref 134–144)
Total Protein: 7.1 g/dL (ref 6.0–8.5)
eGFR: 35 mL/min/{1.73_m2} — ABNORMAL LOW (ref >59)

## 2022-08-25 LAB — T4, FREE: Free T4: 1.07 ng/dL (ref 0.82–1.77)

## 2022-08-25 NOTE — Progress Notes (Signed)
   Subjective:    Patient ID: Kristen Mack, female    DOB: 01/30/1946, 77 y.o.   MRN: [id number]  HPI Patient arrives today for 4 month follow up.  HTN (hypertension), benign - Plan: Microalbumin/Creatinine Ratio, Urine, C-reactive protein, CBC with Differential  Stage 3b chronic kidney disease (HCC) - Plan: C-reactive protein  Type 2 DM with CKD stage 3 and hypertension (HCC) - Plan: Microalbumin/Creatinine Ratio, Urine  Polyuria - Plan: Urinalysis  Left leg pain - Plan: C-reactive protein  Bruising - Plan: CBC with Differential  Myalgia - Plan: C-reactive protein  Morbid obesity (HCC)  She is concerned about some nodules on her legs and the lower leg there is a firmness with some redness and she has had some bruising as well she denies Pain denies swelling in the leg.  She does have some tenderness along the lateral aspect of the left ankle as well similar type of situation. She does follow with the kidney doctor.  She has not had a urine ACR recently.  She does relate a lot of groin pain in the left side hurts with walking denies any falls or injury. Patient states she has knots on lower left leg and foot. Patient states she is having pain in groin and thigh.   Review of Systems     Objective:   Physical Exam General-in no acute distress Eyes-no discharge Lungs-respiratory rate normal, CTA CV-no murmurs,RRR Extremities skin warm dry no edema Neuro grossly normal Behavior normal, alert        Assessment & Plan:  1. HTN (hypertension), benign Blood pressure decent control continue current measures - Microalbumin/Creatinine Ratio, Urine - C-reactive protein - CBC with Differential  2. Stage 3b chronic kidney disease (HCC) Lab work ordered.  Await results urine test ordered await results - C-reactive protein  3. Type 2 DM with CKD stage 3 and hypertension (HCC) Importance of healthy diet regular physical activity as best as possible -  Microalbumin/Creatinine Ratio, Urine  4. Polyuria Patient concerned about possible UTI denies dysuria check urinalysis she just recently urinated so therefore she will do this through [facility] - Urinalysis  5. Left leg pain Some groin pain leg pain discomfort more than likely inflammation in the muscle will do some lab work await results do not feel patient needs x-ray currently but if not improved over the next 2 weeks left hip x-ray  6. Bruising Bruising noted on the lower legs CBC to look at platelets  7. Myalgia Because of left leg myalgia check inflammatory markers  Follow-up in 4 to 5 months

## 2022-08-30 ENCOUNTER — Other Ambulatory Visit: Payer: Self-pay | Admitting: Family Medicine

## 2022-08-31 ENCOUNTER — Encounter: Payer: Self-pay | Admitting: "Endocrinology

## 2022-08-31 ENCOUNTER — Ambulatory Visit (INDEPENDENT_AMBULATORY_CARE_PROVIDER_SITE_OTHER): Payer: PPO | Admitting: "Endocrinology

## 2022-08-31 VITALS — BP 114/78 | HR 76 | Ht 59.0 in | Wt 222.0 lb

## 2022-08-31 DIAGNOSIS — D3502 Benign neoplasm of left adrenal gland: Secondary | ICD-10-CM | POA: Diagnosis not present

## 2022-08-31 DIAGNOSIS — E1122 Type 2 diabetes mellitus with diabetic chronic kidney disease: Secondary | ICD-10-CM

## 2022-08-31 DIAGNOSIS — I129 Hypertensive chronic kidney disease with stage 1 through stage 4 chronic kidney disease, or unspecified chronic kidney disease: Secondary | ICD-10-CM

## 2022-08-31 DIAGNOSIS — Z794 Long term (current) use of insulin: Secondary | ICD-10-CM

## 2022-08-31 DIAGNOSIS — E038 Other specified hypothyroidism: Secondary | ICD-10-CM | POA: Diagnosis not present

## 2022-08-31 DIAGNOSIS — E782 Mixed hyperlipidemia: Secondary | ICD-10-CM | POA: Diagnosis not present

## 2022-08-31 DIAGNOSIS — M810 Age-related osteoporosis without current pathological fracture: Secondary | ICD-10-CM

## 2022-08-31 DIAGNOSIS — N183 Chronic kidney disease, stage 3 unspecified: Secondary | ICD-10-CM

## 2022-08-31 DIAGNOSIS — R3589 Other polyuria: Secondary | ICD-10-CM | POA: Diagnosis not present

## 2022-08-31 DIAGNOSIS — I10 Essential (primary) hypertension: Secondary | ICD-10-CM | POA: Diagnosis not present

## 2022-08-31 DIAGNOSIS — N1832 Chronic kidney disease, stage 3b: Secondary | ICD-10-CM | POA: Diagnosis not present

## 2022-08-31 LAB — POCT GLYCOSYLATED HEMOGLOBIN (HGB A1C): HbA1c, POC (controlled diabetic range): 6.7 % (ref 0.0–7.0)

## 2022-08-31 NOTE — Progress Notes (Signed)
08/31/2022, 4:47 PM     Endocrinology follow-up note   Subjective:    Patient ID: Kristen Mack, female    DOB: 1945/05/14, PCP Babs Sciara, MD   Past Medical History:  Diagnosis Date   ADHD (attention deficit hyperactivity disorder) 2007   Adrenal adenoma, left    followed by dr Zoe Creasman   Arthritis    Asthma    followed by pcp   CFS (chronic fatigue syndrome)    CKD (chronic kidney disease), stage III [facility])    nephrologist---  dr Marisue Humble   Depression    Edema of both lower extremities    Fatty liver disease, nonalcoholic    Fibromyalgia    GERD (gastroesophageal reflux disease)    Hiatal hernia    History of adenomatous polyp of colon    History of cardiac murmur    until age 35- pt has had echocardiograms   History of TIA (transient ischemic attack)    per CT scan    Hyperlipidemia    Hypertension    Hypothyroidism    endocrinologist-- dr g. Cristi Gwynn   IBS (irritable bowel syndrome)    Meniere disease    OSA on CPAP    per pt uses nightly   Osteopenia    Pancreas cyst    followed by pcp   PMB (postmenopausal bleeding)    PVC's (premature ventricular contractions)    cardiologist--- dr Flora Lipps   Type 2 diabetes mellitus treated with insulin [facility])    endocrinologist-- dr g. Icie Kuznicki;    (08-14-2021  per pt checks blood sugar QID ,  fasting average --- 73-150)   Urinary incontinence    Wears glasses    Wears partial dentures    upper   Past Surgical History:  Procedure Laterality Date   CERVICAL POLYPECTOMY N/A 08/17/2021   Procedure: BIOPSY OF ENDOMETRIUM AND OR POLYPECTOMY;  Surgeon: Essie Hart, MD;  Location: [facility];  Service: Gynecology;  Laterality: N/A;   CESAREAN SECTION  1977  &  1979   CHOLECYSTECTOMY  1995   and  REPAIR UMBILICAL HERNIA   COLONOSCOPY N/A 04/19/2013   Procedure: COLONOSCOPY;  Surgeon: Malissa Hippo, MD;  [facility];  Service: Endoscopy;   Laterality: N/A;     DILITATION & CURRETTAGE/HYSTROSCOPY WITH NOVASURE ABLATION N/A 08/17/2021   Procedure: DILATATION & CURETTAGE/HYSTEROSCOPY;  Surgeon: Essie Hart, MD;  Location: [facility];  Service: Gynecology;  Laterality: N/A;   ESOPHAGOGASTRODUODENOSCOPY  02/08/2003   HERNIA REPAIR  1995   HYSTEROSCOPY WITH D & C  11/14/2002   endometrial polypectomy   HYSTEROSCOPY WITH D & C N/A 04/05/2014   Procedure: DILATATION AND CURETTAGE /HYSTEROSCOPY, ENDOMETRIAL POLYPECTOMY;  Surgeon: Essie Hart, MD;  Location: [facility];  Service: Gynecology;  Laterality: N/A;   LIVER BIOPSY  01/29/2004   benign   TUBAL LIGATION  1979   Social History   Socioeconomic History   Marital status: Married    Spouse name: Molly Maduro   Number of children: 2   Years of education: Not on file   Highest education level: Not on file  Occupational History   Occupation: retired, Charity fundraiser  Tobacco Use   Smoking  status: Former    Packs/day: 1.50    Years: 3.00    Additional pack years: 0.00    Total pack years: 4.50    Types: Cigarettes    Quit date: 10/03/1968    Years since quitting: 53.9    Passive exposure: Past   Smokeless tobacco: Never  Vaping Use   Vaping Use: Never used  Substance and Sexual Activity   Alcohol use: No   Drug use: No   Sexual activity: Not Currently    Birth control/protection: None  Other Topics Concern   Not on file  Social History Narrative   2 daughters, Amy and Marcelino Duster. Amy lives with parents. Marcelino Duster lives in [place].   Social Determinants of Health   Financial Resource Strain: Medium Risk (01/05/2022)   Overall Financial Resource Strain (CARDIA)    Difficulty of Paying Living Expenses: Somewhat hard  Food Insecurity: No Food Insecurity (01/05/2022)   Hunger Vital Sign    Worried About Running Out of Food in the Last Year: Never true    Ran Out of Food in the Last Year: Never true  Transportation Needs: No Transportation Needs  (01/05/2022)   PRAPARE - Administrator, Civil Service (Medical): No    Lack of Transportation (Non-Medical): No  Physical Activity: Inactive (01/05/2022)   Exercise Vital Sign    Days of Exercise per Week: 0 days    Minutes of Exercise per Session: 0 min  Stress: No Stress Concern Present (01/05/2022)   [employer] of Occupational Health - Occupational Stress Questionnaire    Feeling of Stress : Only a little  Social Connections: Moderately Isolated (01/05/2022)   Social Connection and Isolation Panel [NHANES]    Frequency of Communication with Friends and Family: More than three times a week    Frequency of Social Gatherings with Friends and Family: Never    Attends Religious Services: Never    Database administrator or Organizations: No    Attends Banker Meetings: Never    Marital Status: Married   Outpatient Encounter Medications as of 08/31/2022  Medication Sig   albuterol (PROVENTIL) (2.5 MG/3ML) 0.083% nebulizer solution Take 3 mLs (2.5 mg total) by nebulization every 6 (six) hours as needed for wheezing or shortness of breath.   albuterol (VENTOLIN HFA) 108 (90 Base) MCG/ACT inhaler Inhale 2 puffs into the lungs every 4 (four) hours as needed. INHALE 2 PUFFS INTO LUNGS EVERY 6 HOURS AS NEEDED FOR SHORTNESS OF BREATH   Azelastine HCl 137 MCG/SPRAY SOLN USE 2 SPRAY(S) IN EACH NOSTRIL TWICE DAILY   B Complex Vitamins (B COMPLEX 100 PO) Take 1 tablet by mouth daily.   blood glucose meter kit and supplies Dispense based on patient and insurance preference. Use to test sugars BID  (FOR ICD-10 E10.9, E11.9).   buPROPion (WELLBUTRIN XL) 300 MG 24 hr tablet Take 1 tablet by mouth once daily   Chromium Picolinate 1000 MCG TABS Take 1 tablet by mouth daily.   Coenzyme Q10 (COQ10) 200 MG CAPS Take 200 mg by mouth daily.   Continuous Blood Gluc Receiver (FREESTYLE LIBRE 2 READER) DEVI As directed   Continuous Blood Gluc Sensor (FREESTYLE LIBRE 2 SENSOR) MISC 1  Piece by Does not apply route every 14 (fourteen) days.   Cyanocobalamin (B-12) 1000 MCG SUBL Place 1,000 mcg under the tongue daily.   DIGESTIVE ENZYMES PO Take 1 tablet by mouth 3 (three) times daily.    gabapentin (NEURONTIN) 100 MG capsule Take 1  capsule by mouth twice daily   glucose blood test strip Use to check blood glucose four times daily as instructed   insulin detemir (LEVEMIR FLEXPEN) 100 UNIT/ML FlexPen INJECT 46 UNITS SUBCUTANEOUSLY AT BEDTIME   levothyroxine (SYNTHROID) 125 MCG tablet TAKE 1 TABLET BY MOUTH ONCE DAILY BEFORE BREAKFAST   Lysine HCl 500 MG TABS Take 500 mg by mouth daily.   Magnesium 250 MG TABS Take 250 mg by mouth 2 (two) times daily.   meclizine (ANTIVERT) 25 MG tablet Take 25 mg by mouth 3 (three) times daily as needed for dizziness.   montelukast (SINGULAIR) 10 MG tablet TAKE 1 TABLET BY MOUTH AT BEDTIME   OVER THE COUNTER MEDICATION Take 1 Scoop by mouth 3 (three) times daily with meals. Fennel seeds   OVER THE COUNTER MEDICATION Choline and inisotol   rosuvastatin (CRESTOR) 5 MG tablet TAKE 1 TABLET BY MOUTH ON MONDAY, WEDNESDAY,  FRIDAY AND SATURDAY   spironolactone (ALDACTONE) 25 MG tablet Take 1/2 (one-half) tablet by mouth once daily   TART CHERRY PO Take 1 tablet by mouth 3 (three) times daily.   tiZANidine (ZANAFLEX) 2 MG tablet TAKE 1 TABLET BY MOUTH AT BEDTIME AS NEEDED -  MAY  CAUSE  DROWSINESS   Vitamin A 3 MG (10000 UT) TABS Take 10,000 Units by mouth daily.   vitamin E 180 MG (400 UNITS) capsule Take 800 Units by mouth daily.    Facility-Administered Encounter Medications as of 08/31/2022  Medication   gentamicin (GARAMYCIN) 330 mg in dextrose 5 % 100 mL IVPB   ALLERGIES: Allergies  Allergen Reactions   Latex Other (See Comments) and Itching    "skin gets raw"   Ambien [Zolpidem Tartrate] Other (See Comments)    Side effect "lost a couple days"; memory loss   Cefzil [Cefprozil] Diarrhea   Citalopram     Other reaction(s): Not available    Codeine Itching and Nausea And Vomiting   Demerol [Meperidine] Other (See Comments)    "messes up senses"   Formaldehyde    Sulfa Antibiotics Other (See Comments)    Severe abd cramp   Zolpidem     Other reaction(s): Not available   Celexa [Citalopram Hydrobromide] Other (See Comments)    Mouth sores    VACCINATION STATUS: Immunization History  Administered Date(s) Administered   Fluad Quad(high Dose 65+) 01/22/2020, 02/10/2021, 01/22/2022   Influenza, High Dose Seasonal PF 04/15/2018, 01/13/2019   Influenza,inj,Quad PF,6+ Mos 03/02/2016, 03/03/2017   Influenza-Unspecified 04/15/2018, 01/15/2019, 01/23/2020   Moderna Sars-Covid-2 Vaccination 05/23/2019, 06/19/2019, 03/05/2020   Pneumococcal Conjugate-13 05/22/2014   Pneumococcal Polysaccharide-23 03/03/2017   Tdap 04/18/2020   Zoster Recombinat (Shingrix) 12/08/2018, 04/27/2019    HPI SHANDEE CALLICUTT is 77 y.o. female who is accompanied by her daughter to clinic.  She usually follows in this clinic for stable, nonfunctioning 1.5 cm left adrenal adenoma which was previously worked up completely.  She also has hypothyroidism, type 2 diabetes followed in this clinic.  She remains on Levemir 46 units nightly.  She presents with her CGM device showing 79% time in range, 20% level 1 hyperglycemia.  She has no hypoglycemia.  Her point-of-care A1c is 6.7%, progressively improving from 12.5%.     She remains on levothyroxine 125 mcg p.o. daily before breakfast.  She reports interruption of her levothyroxine for at least a week prior to her lab work.  Her previsit thyroid function tests are consistent with appropriate replacement.     She has no new complaints today. -  In April 2022,  she underwent surveillance MRI abdomen/pelvis which documented stable adrenal finding.  This study was done to follow-up pancreatic cyst which was also reported to be stable and favoring benign findings.   -Her  previous 24-hour urine function studies  for adrenal function have been within normal limits .  -She denies history of difficult to control hypertension, in fact has history of orthostatic hypotension.  She denies spells of palpitations, headaches, sweating. -She denies close  family history of pituitary, adrenal dysfunctions.  -Regarding her osteoporosis, she received her first Prolia injection in March 2024.  She tolerated this medication very well.  Review of Systems  Limited as above.  Objective:    BP 114/78   Pulse 76   Ht 4\' 11"  (1.499 m)   Wt 222 lb (100.7 kg)   BMI 44.84 kg/m   Wt Readings from Last 3 Encounters:  08/31/22 222 lb (100.7 kg)  08/25/22 223 lb (101.2 kg)  06/11/22 225 lb 9.6 oz (102.3 kg)    Recent Results (from the past 2160 hour(s))  Comprehensive metabolic panel     Status: Abnormal   Collection Time: 08/24/22  4:45 PM  Result Value Ref Range   Glucose 132 (H) 70 - 99 mg/dL   BUN 21 8 - 27 mg/dL   Creatinine, Ser 1.61 (H) 0.57 - 1.00 mg/dL   eGFR 35 (L) >09 UE/AVW/0.98   BUN/Creatinine Ratio 14 12 - 28   Sodium 139 134 - 144 mmol/L   Potassium 4.2 3.5 - 5.2 mmol/L   Chloride 105 96 - 106 mmol/L   CO2 20 20 - 29 mmol/L   Calcium 9.5 8.7 - 10.3 mg/dL   Total Protein 7.1 6.0 - 8.5 g/dL   Albumin 3.7 (L) 3.8 - 4.8 g/dL   Globulin, Total 3.4 1.5 - 4.5 g/dL   Albumin/Globulin Ratio 1.1 (L) 1.2 - 2.2   Bilirubin Total 0.2 0.0 - 1.2 mg/dL   Alkaline Phosphatase 63 44 - 121 IU/L   AST 23 0 - 40 IU/L   ALT 22 0 - 32 IU/L  Lipid panel     Status: Abnormal   Collection Time: 08/24/22  4:45 PM  Result Value Ref Range   Cholesterol, Total 134 100 - 199 mg/dL   Triglycerides 119 (H) 0 - 149 mg/dL   HDL 42 >14 mg/dL   VLDL Cholesterol Cal 33 5 - 40 mg/dL   LDL Chol Calc (NIH) 59 0 - 99 mg/dL   Chol/HDL Ratio 3.2 0.0 - 4.4 ratio    Comment:                                   T. Chol/HDL Ratio                                             Men  Women                               1/2 Avg.Risk   3.4    3.3                                   Avg.Risk  5.0  4.4                                2X Avg.Risk  9.6    7.1                                3X Avg.Risk 23.4   11.0   TSH     Status: Abnormal   Collection Time: 08/24/22  4:45 PM  Result Value Ref Range   TSH 4.840 (H) 0.450 - 4.500 uIU/mL  T4, free     Status: None   Collection Time: 08/24/22  4:45 PM  Result Value Ref Range   Free T4 1.07 0.82 - 1.77 ng/dL    Diabetic Labs (most recent): Lab Results  Component Value Date   HGBA1C 7.3 (H) 12/11/2021   HGBA1C 7.0 10/26/2021   HGBA1C 12.5 (H) 07/16/2021   MICROALBUR 0.4 05/07/2014   MICROALBUR 1.74 08/09/2013     Lipid Panel ( most recent) Lipid Panel     Component Value Date/Time   CHOL 134 08/24/2022 1645   TRIG 198 (H) 08/24/2022 1645   HDL 42 08/24/2022 1645   CHOLHDL 3.2 08/24/2022 1645   CHOLHDL 3.3 05/07/2014 1122   VLDL 24 05/07/2014 1122   LDLCALC 59 08/24/2022 1645          MRI of abdomen on May 04, 2017:  Normal right adrenal gland. Left adrenal nodule on the order of 1.5 cm is consistent with an adenoma.   Surveillance abdominal MRI from December 25, 2019 IMPRESSION: 1. Motion degraded exam shows multiple tiny cystic lesions in the pancreas. Dominant lesion has minimally increased from 14 mm to 17 mm in the 2.5 year interval since previous MRI. No gross soft tissue component or abnormal enhancement evident on today's markedly motion degraded exam. At 17 mm, consensus guidelines recommend repeat imaging every 6 months for 2 years to ensure stability. This recommendation follows ACR consensus guidelines: Management of Incidental Pancreatic Cysts: A White Paper of the ACR Incidental Findings Committee. J Am Coll Radiol 2017;14:911-923. 2. Additional tiny 6 mm cystic foci in the body/tail region are new. Attention on follow-up recommended. 3. Abnormal fluid in the endometrial cavity of the uterus. Pelvic ultrasound for further  evaluation. 4. Stable left adrenal adenoma. 5. Small hiatal hernia.   MRI of abdomen/pelvis on July 15, 2020 IMPRESSION: Motion degraded images.   16 mm unilocular pancreatic cyst, unchanged. This favors a benign pseudocyst or side branch IPMN and is of questionable clinical significance. Consider follow-up MR or CT abdomen with/without contrast in 1 year, as clinically warranted. (MR is generally favored, but CT may be preferred given motion concerns).   Suspected endometrial soft tissue/thickening, incompletely visualized/evaluated. This continues to raise concern for early endometrial neoplasm. GYN consultation is suggested.   These results will be called to the ordering clinician or representative by the Radiologist Assistant, and communication documented in the PACS or [employer].  Assessment & Plan:   1.  Type 2 diabetes-  Based on her presentation with controlled glycemic profile and recent A1c of 7.3%, she will not need additional or prandial insulin for now.     She presents with her CGM device showing 79% time in range, 20% level 1 hyperglycemia.  She has no hypoglycemia.  Her point-of-care A1c is 6.7%, progressively improving from 12.5%.    She is advised to  call clinic for blood glucose readings less than 70 or greater than 200 mg per DL x3.   It appears that she was taken off of Jardiance since her last visit.   Patient stands to benefit from lifestyle medicine, however she states that whole food , plant-based diet will not work for her.    - she acknowledges that there is a room for improvement in her food and drink choices. - Suggestion is made for her to avoid simple carbohydrates  from her diet including Cakes, Sweet Desserts, Ice Cream, Soda (diet and regular), Sweet Tea, Candies, Chips, Cookies, Store Bought Juices, Alcohol , Artificial Sweeteners,  Coffee Creamer, and "Sugar-free" Products, Lemonade. This will help patient to have more stable blood  glucose profile and potentially avoid unintended weight gain.  The following Lifestyle Medicine recommendations according to American College of Lifestyle Medicine  [facility]) were discussed and and offered to patient and she  agrees to start the journey:  A. Whole Foods, Plant-Based Nutrition comprising of fruits and vegetables, plant-based proteins, whole-grain carbohydrates was discussed in detail with the patient.   A list for source of those nutrients were also provided to the patient.  Patient will use only water or unsweetened tea for hydration. B.  The need to stay away from risky substances including alcohol, smoking; obtaining 7 to 9 hours of restorative sleep, at least 150 minutes of moderate intensity exercise weekly, the importance of healthy social connections,  and stress management techniques were discussed. C.  A full color page of  Calorie density of various food groups per pound showing examples of each food groups was provided to the patient.  She is advised to continue Levemir 46 units nightly.  She is advised to call clinic for hypoglycemia below 70 or hyperglycemia greater than 200 mg per DL.  2.  Adenoma of left adrenal gland I reviewed her interval labs, imaging studies with her and her daughter in the exam room.    The left adrenal gland is reported to have mild thickening, right adrenal gland is normal.    -Her previsit plasma metanephrines are within normal limits.   -She has previously undergone 24-hour urine studies for catecholamines, metanephrines, cortisol, and aldosterone, which are all within normal limits.  -This is consistent with nonfunctioning left adrenal adenoma of 1.5 cm, stable on imaging studies.   -She will not require any specific treatment at this time. -Related to her pancreatic cysts/pseudocysts she is scheduled to have MRI every 6 months, will use those  imaging studies to follow-up on the adrenal adenoma.      3.  Hypothyroidism Her previsit  thyroid function tests are consistent with under replacement, however admittedly, she has not been consistent taking her levothyroxine.  She is advised to continue levothyroxine 125 mcg p.o. daily before breakfast.      - We discussed about the correct intake of her thyroid hormone, on empty stomach at fasting, with water, separated by at least 30 minutes from breakfast and other medications,  and separated by more than 4 hours from calcium, iron, multivitamins, acid reflux medications (PPIs). -Patient is made aware of the fact that thyroid hormone replacement is needed for life, dose to be adjusted by periodic monitoring of thyroid function tests.   For Osteoporosis: she received her Prolia injection in March 2024, she is due for her next injection in September 2024.   - I advised patient to maintain close follow up with Babs Sciara, MD for primary care  needs.   I spent  25  minutes in the care of the patient today including review of labs from Thyroid Function, CMP, and other relevant labs ; imaging/biopsy records (current and previous including abstractions from other facilities); face-to-face time discussing  her lab results and symptoms, medications doses, her options of short and long term treatment based on the latest standards of care / guidelines;   and documenting the encounter.  Harrie Jeans  participated in the discussions, expressed understanding, and voiced agreement with the above plans.  All questions were answered to her satisfaction. she is encouraged to contact clinic should she have any questions or concerns prior to her return visit.    Follow up plan: Return in about 15 weeks (around 12/14/2022) for Prolia During NV, A1c -NV, F/U with Pre-visit Labs.   Marquis Lunch, MD [facility] [address] [place], [place] [address] Phone: [phone]  Fax: [phone]     08/31/2022, 4:47 PM  This note was  partially dictated with voice recognition software. Similar sounding words can be transcribed inadequately or may not  be corrected upon review.

## 2022-09-01 LAB — C-REACTIVE PROTEIN: CRP: 2 mg/L (ref 0–10)

## 2022-09-01 LAB — URINALYSIS
Bilirubin, UA: NEGATIVE
Glucose, UA: NEGATIVE
Ketones, UA: NEGATIVE
Nitrite, UA: NEGATIVE
Specific Gravity, UA: 1.011 (ref 1.005–1.030)
Urobilinogen, Ur: 0.2 mg/dL (ref 0.2–1.0)
pH, UA: 6 (ref 5.0–7.5)

## 2022-09-01 LAB — CBC WITH DIFFERENTIAL/PLATELET
Basophils Absolute: 0.1 10*3/uL (ref 0.0–0.2)
Basos: 1 %
EOS (ABSOLUTE): 0.3 10*3/uL (ref 0.0–0.4)
Eos: 3 %
Hematocrit: 39.9 % (ref 34.0–46.6)
Hemoglobin: 13.2 g/dL (ref 11.1–15.9)
Immature Grans (Abs): 0 10*3/uL (ref 0.0–0.1)
Immature Granulocytes: 0 %
Lymphocytes Absolute: 2 10*3/uL (ref 0.7–3.1)
Lymphs: 19 %
MCH: 27.6 pg (ref 26.6–33.0)
MCHC: 33.1 g/dL (ref 31.5–35.7)
MCV: 83 fL (ref 79–97)
Monocytes Absolute: 0.8 10*3/uL (ref 0.1–0.9)
Monocytes: 8 %
Neutrophils Absolute: 7.5 10*3/uL — ABNORMAL HIGH (ref 1.4–7.0)
Neutrophils: 69 %
Platelets: 404 10*3/uL (ref 150–450)
RBC: 4.79 x10E6/uL (ref 3.77–5.28)
RDW: 13.7 % (ref 11.7–15.4)
WBC: 10.7 10*3/uL (ref 3.4–10.8)

## 2022-09-01 LAB — MICROALBUMIN / CREATININE URINE RATIO
Creatinine, Urine: 71.7 mg/dL
Microalb/Creat Ratio: 147 mg/g creat — ABNORMAL HIGH (ref 0–29)
Microalbumin, Urine: 105.5 ug/mL

## 2022-09-12 DIAGNOSIS — E1122 Type 2 diabetes mellitus with diabetic chronic kidney disease: Secondary | ICD-10-CM | POA: Diagnosis not present

## 2022-09-22 ENCOUNTER — Other Ambulatory Visit: Payer: Self-pay | Admitting: Family Medicine

## 2022-10-04 ENCOUNTER — Other Ambulatory Visit (HOSPITAL_COMMUNITY): Payer: Self-pay | Admitting: Nurse Practitioner

## 2022-10-04 DIAGNOSIS — Z1231 Encounter for screening mammogram for malignant neoplasm of breast: Secondary | ICD-10-CM

## 2022-10-12 DIAGNOSIS — E1122 Type 2 diabetes mellitus with diabetic chronic kidney disease: Secondary | ICD-10-CM | POA: Diagnosis not present

## 2022-10-18 ENCOUNTER — Ambulatory Visit (HOSPITAL_COMMUNITY): Payer: PPO

## 2022-10-21 ENCOUNTER — Encounter (HOSPITAL_COMMUNITY): Payer: Self-pay

## 2022-10-21 ENCOUNTER — Ambulatory Visit (HOSPITAL_COMMUNITY)
Admission: RE | Admit: 2022-10-21 | Discharge: 2022-10-21 | Disposition: A | Discharge status: Home / Self Care | Payer: PPO | Source: Ambulatory Visit | Attending: Nurse Practitioner | Admitting: Nurse Practitioner

## 2022-10-21 DIAGNOSIS — Z1231 Encounter for screening mammogram for malignant neoplasm of breast: Secondary | ICD-10-CM | POA: Insufficient documentation

## 2022-10-27 ENCOUNTER — Ambulatory Visit (INDEPENDENT_AMBULATORY_CARE_PROVIDER_SITE_OTHER): Payer: PPO | Admitting: Nurse Practitioner

## 2022-10-27 DIAGNOSIS — M25542 Pain in joints of left hand: Secondary | ICD-10-CM

## 2022-10-27 NOTE — Progress Notes (Unsigned)
   Subjective:    Patient ID: Kristen Mack, female    DOB: [birth year], 77 y.o.   MRN: [id number]  HPI Patient arrives with left hand pain. Patient states mostly the left middle finger.  Began with some soreness several days ago.  No history of injury.  Became extremely painful yesterday.  Could not touch the area.  Had difficulty moving her hand due to the pain.  Slightly better today.  Minimal edema.  No previous history of gout.  Has tried some ice applications and Tylenol for her discomfort.      Objective:   Physical Exam NAD.  Alert, oriented.  Strong radial pulse left hand.  Hand warm with normal capillary refill.  Small area of localized mild edema at the base of the index and middle fingers.  No significant erythema.  Very tender to even light touch.  Normal ROM of the fingers.  Patient defers vitals today.     Assessment & Plan:  Pain involving joint of finger of left hand - Plan: Uric Acid Uric acid level pending. Ice/heat applications as tolerated. Cannot take oral NSAIDs.  Recommend Voltaren gel as directed topically. Hold on the use of prednisone due to patient's diabetes. Further follow-up based on uric acid level, consider x-ray if this test is normal. Call back if worsens or persists.

## 2022-10-28 ENCOUNTER — Other Ambulatory Visit: Payer: Self-pay | Admitting: Nurse Practitioner

## 2022-10-28 ENCOUNTER — Encounter: Payer: Self-pay | Admitting: Nurse Practitioner

## 2022-10-28 DIAGNOSIS — M79642 Pain in left hand: Secondary | ICD-10-CM

## 2022-10-28 DIAGNOSIS — M25542 Pain in joints of left hand: Secondary | ICD-10-CM

## 2022-10-29 ENCOUNTER — Ambulatory Visit (HOSPITAL_COMMUNITY)
Admission: RE | Admit: 2022-10-29 | Discharge: 2022-10-29 | Disposition: A | Discharge status: Home / Self Care | Payer: PPO | Source: Ambulatory Visit | Attending: Nurse Practitioner | Admitting: Nurse Practitioner

## 2022-10-29 DIAGNOSIS — M25542 Pain in joints of left hand: Secondary | ICD-10-CM | POA: Insufficient documentation

## 2022-10-29 DIAGNOSIS — M79642 Pain in left hand: Secondary | ICD-10-CM | POA: Diagnosis not present

## 2022-10-29 DIAGNOSIS — M1812 Unilateral primary osteoarthritis of first carpometacarpal joint, left hand: Secondary | ICD-10-CM | POA: Diagnosis not present

## 2022-11-07 NOTE — Progress Notes (Signed)
Pharmacy Quality Measure Review  This patient is appearing on a report for being at risk of failing the adherence measure for cholesterol (statin) medications this calendar year.   Medication: rosuvastatin 5 mg Last fill date: 10/19/22 for 30 day supply  Insurance report was not up to date. No action needed at this time.   Nils Pyle, PharmD PGY1 Pharmacy Resident

## 2022-11-10 DIAGNOSIS — N1832 Chronic kidney disease, stage 3b: Secondary | ICD-10-CM | POA: Diagnosis not present

## 2022-11-12 ENCOUNTER — Encounter: Payer: PPO | Admitting: Nurse Practitioner

## 2022-11-12 DIAGNOSIS — E1122 Type 2 diabetes mellitus with diabetic chronic kidney disease: Secondary | ICD-10-CM | POA: Diagnosis not present

## 2022-12-07 ENCOUNTER — Other Ambulatory Visit: Payer: Self-pay | Admitting: "Endocrinology

## 2022-12-13 ENCOUNTER — Other Ambulatory Visit: Payer: Self-pay | Admitting: Family Medicine

## 2022-12-13 ENCOUNTER — Telehealth: Payer: Self-pay

## 2022-12-13 DIAGNOSIS — I129 Hypertensive chronic kidney disease with stage 1 through stage 4 chronic kidney disease, or unspecified chronic kidney disease: Secondary | ICD-10-CM | POA: Diagnosis not present

## 2022-12-13 DIAGNOSIS — N183 Chronic kidney disease, stage 3 unspecified: Secondary | ICD-10-CM | POA: Diagnosis not present

## 2022-12-13 DIAGNOSIS — E1122 Type 2 diabetes mellitus with diabetic chronic kidney disease: Secondary | ICD-10-CM | POA: Diagnosis not present

## 2022-12-13 DIAGNOSIS — E038 Other specified hypothyroidism: Secondary | ICD-10-CM | POA: Diagnosis not present

## 2022-12-13 NOTE — Telephone Encounter (Signed)
Left a message requesting pt return call to the office. 

## 2022-12-14 ENCOUNTER — Encounter: Payer: Self-pay | Admitting: "Endocrinology

## 2022-12-14 ENCOUNTER — Ambulatory Visit (INDEPENDENT_AMBULATORY_CARE_PROVIDER_SITE_OTHER): Payer: PPO | Admitting: "Endocrinology

## 2022-12-14 VITALS — BP 130/84 | HR 60 | Ht 59.0 in | Wt 223.6 lb

## 2022-12-14 DIAGNOSIS — Z794 Long term (current) use of insulin: Secondary | ICD-10-CM

## 2022-12-14 DIAGNOSIS — M810 Age-related osteoporosis without current pathological fracture: Secondary | ICD-10-CM

## 2022-12-14 DIAGNOSIS — N183 Chronic kidney disease, stage 3 unspecified: Secondary | ICD-10-CM | POA: Diagnosis not present

## 2022-12-14 DIAGNOSIS — E1122 Type 2 diabetes mellitus with diabetic chronic kidney disease: Secondary | ICD-10-CM | POA: Diagnosis not present

## 2022-12-14 DIAGNOSIS — I129 Hypertensive chronic kidney disease with stage 1 through stage 4 chronic kidney disease, or unspecified chronic kidney disease: Secondary | ICD-10-CM

## 2022-12-14 DIAGNOSIS — E038 Other specified hypothyroidism: Secondary | ICD-10-CM

## 2022-12-14 DIAGNOSIS — E782 Mixed hyperlipidemia: Secondary | ICD-10-CM | POA: Diagnosis not present

## 2022-12-14 LAB — POCT GLYCOSYLATED HEMOGLOBIN (HGB A1C): HbA1c, POC (controlled diabetic range): 7.1 % — AB (ref 0.0–7.0)

## 2022-12-14 MED ORDER — DENOSUMAB 60 MG/ML ~~LOC~~ SOSY
60.0000 mg | PREFILLED_SYRINGE | Freq: Once | SUBCUTANEOUS | Status: AC
Start: 2022-12-14 — End: 2022-12-14
  Administered 2022-12-14: 60 mg via SUBCUTANEOUS

## 2022-12-14 NOTE — Patient Instructions (Signed)

## 2022-12-14 NOTE — Progress Notes (Signed)
12/14/2022, 5:59 PM     Endocrinology follow-up note   Subjective:    Patient ID: Kristen Mack, female    DOB: 1945/09/09, PCP Babs Sciara, MD   Past Medical History:  Diagnosis Date   ADHD (attention deficit hyperactivity disorder) 2007   Adrenal adenoma, left    followed by dr Alto Gandolfo   Arthritis    Asthma    followed by pcp   CFS (chronic fatigue syndrome)    CKD (chronic kidney disease), stage III [facility])    nephrologist---  dr Marisue Humble   Depression    Edema of both lower extremities    Fatty liver disease, nonalcoholic    Fibromyalgia    GERD (gastroesophageal reflux disease)    Hiatal hernia    History of adenomatous polyp of colon    History of cardiac murmur    until age 6- pt has had echocardiograms   History of TIA (transient ischemic attack)    per CT scan    Hyperlipidemia    Hypertension    Hypothyroidism    endocrinologist-- dr g. Ia Leeb   IBS (irritable bowel syndrome)    Meniere disease    OSA on CPAP    per pt uses nightly   Osteopenia    Pancreas cyst    followed by pcp   PMB (postmenopausal bleeding)    PVC's (premature ventricular contractions)    cardiologist--- dr Flora Lipps   Type 2 diabetes mellitus treated with insulin [facility])    endocrinologist-- dr g. Angelee Bahr;    (08-14-2021  per pt checks blood sugar QID ,  fasting average --- 73-150)   Urinary incontinence    Wears glasses    Wears partial dentures    upper   Past Surgical History:  Procedure Laterality Date   CERVICAL POLYPECTOMY N/A 08/17/2021   Procedure: BIOPSY OF ENDOMETRIUM AND OR POLYPECTOMY;  Surgeon: Essie Hart, MD;  Location: [facility];  Service: Gynecology;  Laterality: N/A;   CESAREAN SECTION  1977  &  1979   CHOLECYSTECTOMY  1995   and  REPAIR UMBILICAL HERNIA   COLONOSCOPY N/A 04/19/2013   Procedure: COLONOSCOPY;  Surgeon: Malissa Hippo, MD;  [facility];  Service: Endoscopy;   Laterality: N/A;     DILITATION & CURRETTAGE/HYSTROSCOPY WITH NOVASURE ABLATION N/A 08/17/2021   Procedure: DILATATION & CURETTAGE/HYSTEROSCOPY;  Surgeon: Essie Hart, MD;  Location: [facility];  Service: Gynecology;  Laterality: N/A;   ESOPHAGOGASTRODUODENOSCOPY  02/08/2003   HERNIA REPAIR  1995   HYSTEROSCOPY WITH D & C  11/14/2002   endometrial polypectomy   HYSTEROSCOPY WITH D & C N/A 04/05/2014   Procedure: DILATATION AND CURETTAGE /HYSTEROSCOPY, ENDOMETRIAL POLYPECTOMY;  Surgeon: Essie Hart, MD;  Location: [facility];  Service: Gynecology;  Laterality: N/A;   LIVER BIOPSY  01/29/2004   benign   TUBAL LIGATION  1979   Social History   Socioeconomic History   Marital status: Married    Spouse name: Molly Maduro   Number of children: 2   Years of education: Not on file   Highest education level: Not on file  Occupational History   Occupation: retired, Charity fundraiser  Tobacco Use   Smoking  status: Former    Current packs/day: 0.00    Average packs/day: 1.5 packs/day for 3.0 years (4.5 ttl pk-yrs)    Types: Cigarettes    Start date: 10/03/1965    Quit date: 10/03/1968    Years since quitting: 54.2    Passive exposure: Past   Smokeless tobacco: Never  Vaping Use   Vaping status: Never Used  Substance and Sexual Activity   Alcohol use: No   Drug use: No   Sexual activity: Not Currently    Birth control/protection: None  Other Topics Concern   Not on file  Social History Narrative   2 daughters, Amy and Marcelino Duster. Amy lives with parents. Marcelino Duster lives in [place].   Social Determinants of Health   Financial Resource Strain: Medium Risk (01/05/2022)   Overall Financial Resource Strain (CARDIA)    Difficulty of Paying Living Expenses: Somewhat hard  Food Insecurity: No Food Insecurity (01/05/2022)   Hunger Vital Sign    Worried About Running Out of Food in the Last Year: Never true    Ran Out of Food in the Last Year: Never true  Transportation Needs: No  Transportation Needs (01/05/2022)   PRAPARE - Administrator, Civil Service (Medical): No    Lack of Transportation (Non-Medical): No  Physical Activity: Inactive (01/05/2022)   Exercise Vital Sign    Days of Exercise per Week: 0 days    Minutes of Exercise per Session: 0 min  Stress: No Stress Concern Present (01/05/2022)   [employer] of Occupational Health - Occupational Stress Questionnaire    Feeling of Stress : Only a little  Social Connections: Moderately Isolated (01/05/2022)   Social Connection and Isolation Panel [NHANES]    Frequency of Communication with Friends and Family: More than three times a week    Frequency of Social Gatherings with Friends and Family: Never    Attends Religious Services: Never    Database administrator or Organizations: No    Attends Banker Meetings: Never    Marital Status: Married   Outpatient Encounter Medications as of 12/14/2022  Medication Sig   albuterol (PROVENTIL) (2.5 MG/3ML) 0.083% nebulizer solution Take 3 mLs (2.5 mg total) by nebulization every 6 (six) hours as needed for wheezing or shortness of breath.   albuterol (VENTOLIN HFA) 108 (90 Base) MCG/ACT inhaler Inhale 2 puffs into the lungs every 4 (four) hours as needed. INHALE 2 PUFFS INTO LUNGS EVERY 6 HOURS AS NEEDED FOR SHORTNESS OF BREATH   Azelastine HCl 137 MCG/SPRAY SOLN USE 2 SPRAY(S) IN EACH NOSTRIL TWICE DAILY   B Complex Vitamins (B COMPLEX 100 PO) Take 1 tablet by mouth daily.   blood glucose meter kit and supplies Dispense based on patient and insurance preference. Use to test sugars BID  (FOR ICD-10 E10.9, E11.9).   buPROPion (WELLBUTRIN XL) 300 MG 24 hr tablet Take 1 tablet by mouth once daily   Chromium Picolinate 1000 MCG TABS Take 1 tablet by mouth daily.   Coenzyme Q10 (COQ10) 200 MG CAPS Take 200 mg by mouth daily.   Continuous Blood Gluc Receiver (FREESTYLE LIBRE 2 READER) DEVI As directed   Continuous Blood Gluc Sensor (FREESTYLE  LIBRE 2 SENSOR) MISC 1 Piece by Does not apply route every 14 (fourteen) days.   Cyanocobalamin (B-12) 1000 MCG SUBL Place 1,000 mcg under the tongue daily.   denosumab (PROLIA) 60 MG/ML SOSY injection INJECT 60 MG SUBCUTANEOUSLY ONCE FOR 1 DOSE   DIGESTIVE ENZYMES PO Take 1  tablet by mouth 3 (three) times daily.    gabapentin (NEURONTIN) 100 MG capsule Take 1 capsule by mouth twice daily   glucose blood test strip Use to check blood glucose four times daily as instructed   insulin detemir (LEVEMIR FLEXPEN) 100 UNIT/ML FlexPen INJECT 46 UNITS SUBCUTANEOUSLY AT BEDTIME   levothyroxine (SYNTHROID) 125 MCG tablet TAKE 1 TABLET BY MOUTH ONCE DAILY BEFORE BREAKFAST   Lysine HCl 500 MG TABS Take 500 mg by mouth daily.   Magnesium 250 MG TABS Take 250 mg by mouth 2 (two) times daily.   meclizine (ANTIVERT) 25 MG tablet Take 25 mg by mouth 3 (three) times daily as needed for dizziness.   montelukast (SINGULAIR) 10 MG tablet TAKE 1 TABLET BY MOUTH AT BEDTIME   OVER THE COUNTER MEDICATION Take 1 Scoop by mouth 3 (three) times daily with meals. Fennel seeds   OVER THE COUNTER MEDICATION Choline and inisotol   RELION PEN NEEDLES 31G X 6 MM MISC USE TO INJECT INSULIN ONCE DAILY   rosuvastatin (CRESTOR) 5 MG tablet TAKE 1 TABLET BY MOUTH ON MONDAY, WEDNESDAY,  FRIDAY AND SATURDAY   spironolactone (ALDACTONE) 25 MG tablet Take 1/2 (one-half) tablet by mouth once daily   TART CHERRY PO Take 1 tablet by mouth 3 (three) times daily.   tiZANidine (ZANAFLEX) 2 MG tablet TAKE 1 TABLET BY MOUTH AT BEDTIME AS NEEDED -  MAY  CAUSE  DROWSINESS   Vitamin A 3 MG (10000 UT) TABS Take 10,000 Units by mouth daily.   vitamin E 180 MG (400 UNITS) capsule Take 800 Units by mouth daily.    Facility-Administered Encounter Medications as of 12/14/2022  Medication   [COMPLETED] denosumab (PROLIA) injection 60 mg   gentamicin (GARAMYCIN) 330 mg in dextrose 5 % 100 mL IVPB   ALLERGIES: Allergies  Allergen Reactions   Latex  Other (See Comments) and Itching    "skin gets raw"   Ambien [Zolpidem Tartrate] Other (See Comments)    Side effect "lost a couple days"; memory loss   Cefzil [Cefprozil] Diarrhea   Citalopram     Other reaction(s): Not available   Codeine Itching and Nausea And Vomiting   Demerol [Meperidine] Other (See Comments)    "messes up senses"   Formaldehyde    Sulfa Antibiotics Other (See Comments)    Severe abd cramp   Zolpidem     Other reaction(s): Not available   Celexa [Citalopram Hydrobromide] Other (See Comments)    Mouth sores    VACCINATION STATUS: Immunization History  Administered Date(s) Administered   Fluad Quad(high Dose 65+) 01/22/2020, 02/10/2021, 01/22/2022   Influenza, High Dose Seasonal PF 04/15/2018, 01/13/2019   Influenza,inj,Quad PF,6+ Mos 03/02/2016, 03/03/2017   Influenza-Unspecified 04/15/2018, 01/15/2019, 01/23/2020   Moderna Sars-Covid-2 Vaccination 05/23/2019, 06/19/2019, 03/05/2020   Pneumococcal Conjugate-13 05/22/2014   Pneumococcal Polysaccharide-23 03/03/2017   Tdap 04/18/2020   Zoster Recombinant(Shingrix) 12/08/2018, 04/27/2019    HPI Kristen Mack is 77 y.o. female who is accompanied by her daughter to clinic.  She usually follows in this clinic for stable, nonfunctioning 1.5 cm left adrenal adenoma which was previously worked up completely.  She also has hypothyroidism, osteoporosis, type 2 diabetes followed in this clinic.  She remains on Levemir 46 units nightly.  She presents with her CGM device showing 65% time in range, 28% level 1 hyperglycemia, 7 % level 2 hyperglycemia.  She has no hypoglycemia.  Her point-of-care A1c is 7.1% increasing from 6.7%.   She remains on levothyroxine 125  mcg p.o. daily before breakfast.   -  Her previsit thyroid function tests are consistent with appropriate replacement.    She has no new complaints today. -In April 2022,  she underwent surveillance MRI abdomen/pelvis which documented stable adrenal  finding.  This study was done to follow-up pancreatic cyst which was also reported to be stable and favoring benign findings.   -Her  previous 24-hour urine function studies for adrenal function have been within normal limits .  -She denies history of difficult to control hypertension, in fact has history of orthostatic hypotension.  She denies spells of palpitations, headaches, sweating. -She denies close  family history of pituitary, adrenal dysfunctions.  -Regarding her osteoporosis, she is on ongoing Prolia injection every 6 months.     She tolerated this medication very well.  Review of Systems  Limited as above.  Objective:    BP 130/84   Pulse 60   Ht 4\' 11"  (1.499 m)   Wt 223 lb 9.6 oz (101.4 kg)   BMI 45.16 kg/m   Wt Readings from Last 3 Encounters:  12/14/22 223 lb 9.6 oz (101.4 kg)  08/31/22 222 lb (100.7 kg)  08/25/22 223 lb (101.2 kg)    Recent Results (from the past 2160 hour(s))  Uric Acid     Status: None   Collection Time: 10/27/22  2:22 PM  Result Value Ref Range   Uric Acid 7.5 3.1 - 7.9 mg/dL    Comment:            Therapeutic target for gout patients: <6.0  T4, free     Status: None   Collection Time: 12/13/22  4:32 PM  Result Value Ref Range   Free T4 1.55 0.82 - 1.77 ng/dL  TSH     Status: Abnormal   Collection Time: 12/13/22  4:32 PM  Result Value Ref Range   TSH 0.309 (L) 0.450 - 4.500 uIU/mL  Comprehensive metabolic panel     Status: Abnormal   Collection Time: 12/13/22  4:32 PM  Result Value Ref Range   Glucose 141 (H) 70 - 99 mg/dL   BUN 16 8 - 27 mg/dL   Creatinine, Ser 1.61 (H) 0.57 - 1.00 mg/dL   eGFR 42 (L) >09 UE/AVW/0.98   BUN/Creatinine Ratio 12 12 - 28   Sodium 143 134 - 144 mmol/L   Potassium 3.4 (L) 3.5 - 5.2 mmol/L   Chloride 106 96 - 106 mmol/L   CO2 20 20 - 29 mmol/L   Calcium 10.0 8.7 - 10.3 mg/dL   Total Protein 7.0 6.0 - 8.5 g/dL   Albumin 3.7 (L) 3.8 - 4.8 g/dL   Globulin, Total 3.3 1.5 - 4.5 g/dL   Bilirubin Total  0.4 0.0 - 1.2 mg/dL   Alkaline Phosphatase 78 44 - 121 IU/L   AST 26 0 - 40 IU/L   ALT 27 0 - 32 IU/L  HgB A1c     Status: Abnormal   Collection Time: 12/14/22  3:54 PM  Result Value Ref Range   Hemoglobin A1C     HbA1c POC (<> result, manual entry)     HbA1c, POC (prediabetic range)     HbA1c, POC (controlled diabetic range) 7.1 (A) 0.0 - 7.0 %    Diabetic Labs (most recent): Lab Results  Component Value Date   HGBA1C 7.1 (A) 12/14/2022   HGBA1C 6.7 08/31/2022   HGBA1C 7.3 (H) 12/11/2021   MICROALBUR 0.4 05/07/2014   MICROALBUR 1.74 08/09/2013  Lipid Panel ( most recent) Lipid Panel     Component Value Date/Time   CHOL 134 08/24/2022 1645   TRIG 198 (H) 08/24/2022 1645   HDL 42 08/24/2022 1645   CHOLHDL 3.2 08/24/2022 1645   CHOLHDL 3.3 05/07/2014 1122   VLDL 24 05/07/2014 1122   LDLCALC 59 08/24/2022 1645          MRI of abdomen on May 04, 2017:  Normal right adrenal gland. Left adrenal nodule on the order of 1.5 cm is consistent with an adenoma.   Surveillance abdominal MRI from December 25, 2019 IMPRESSION: 1. Motion degraded exam shows multiple tiny cystic lesions in the pancreas. Dominant lesion has minimally increased from 14 mm to 17 mm in the 2.5 year interval since previous MRI. No gross soft tissue component or abnormal enhancement evident on today's markedly motion degraded exam. At 17 mm, consensus guidelines recommend repeat imaging every 6 months for 2 years to ensure stability. This recommendation follows ACR consensus guidelines: Management of Incidental Pancreatic Cysts: A White Paper of the ACR Incidental Findings Committee. J Am Coll Radiol 2017;14:911-923. 2. Additional tiny 6 mm cystic foci in the body/tail region are new. Attention on follow-up recommended. 3. Abnormal fluid in the endometrial cavity of the uterus. Pelvic ultrasound for further evaluation. 4. Stable left adrenal adenoma. 5. Small hiatal hernia.   MRI of  abdomen/pelvis on July 15, 2020 IMPRESSION: Motion degraded images.   16 mm unilocular pancreatic cyst, unchanged. This favors a benign pseudocyst or side branch IPMN and is of questionable clinical significance. Consider follow-up MR or CT abdomen with/without contrast in 1 year, as clinically warranted. (MR is generally favored, but CT may be preferred given motion concerns).   Suspected endometrial soft tissue/thickening, incompletely visualized/evaluated. This continues to raise concern for early endometrial neoplasm. GYN consultation is suggested.   These results will be called to the ordering clinician or representative by the Radiologist Assistant, and communication documented in the PACS or [employer].  Assessment & Plan:   1.  Type 2 diabetes-  Based on her presentation with controlled glycemic profile and point-of-care A1c was 7.1%.  She will not need prandial insulin for now.   Her AGP report shows 65% time in range, 28% level 1 hyperglycemia, 7% able to hyperglycemia, her point-of-care A1c was 7.1% progressively improving from 12.5%.    It appears that she was taken off of Jardiance since her last visit.   Patient stands to benefit from lifestyle medicine, however she states that whole food , plant-based diet will not work for her.    - she acknowledges that there is a room for improvement in her food and drink choices. - Suggestion is made for her to avoid simple carbohydrates  from her diet including Cakes, Sweet Desserts, Ice Cream, Soda (diet and regular), Sweet Tea, Candies, Chips, Cookies, Store Bought Juices, Alcohol , Artificial Sweeteners,  Coffee Creamer, and "Sugar-free" Products, Lemonade. This will help patient to have more stable blood glucose profile and potentially avoid unintended weight gain.  The following Lifestyle Medicine recommendations according to American College of Lifestyle Medicine  [facility]) were discussed and and offered to patient and  she  agrees to start the journey:  A. Whole Foods, Plant-Based Nutrition comprising of fruits and vegetables, plant-based proteins, whole-grain carbohydrates was discussed in detail with the patient.   A list for source of those nutrients were also provided to the patient.  Patient will use only water or unsweetened tea for hydration.  B.  The need to stay away from risky substances including alcohol, smoking; obtaining 7 to 9 hours of restorative sleep, at least 150 minutes of moderate intensity exercise weekly, the importance of healthy social connections,  and stress management techniques were discussed. C.  A full color page of  Calorie density of various food groups per pound showing examples of each food groups was provided to the patient.  She is advised to continue Levemir 46 units nightly associated with continued utility of her CGM.    She is advised to call clinic for hypoglycemia below 70 or hyperglycemia greater than 200 mg per DL.  2.  Adenoma of left adrenal gland I reviewed her interval labs, imaging studies with her and her daughter in the exam room.    The left adrenal gland is reported to have mild thickening, right adrenal gland is normal.    -Her previsit plasma metanephrines are within normal limits.   -She has previously undergone 24-hour urine studies for catecholamines, metanephrines, cortisol, and aldosterone, which are all within normal limits.  -This is consistent with nonfunctioning left adrenal adenoma of 1.5 cm, stable on imaging studies.   -She will not require any specific treatment at this time. -Related to her pancreatic cysts/pseudocysts she is scheduled to have MRI every 6 months, will use those  imaging studies to follow-up on the adrenal adenoma.      3.  Hypothyroidism Her previsit thyroid function tests are consistent with appropriate replacement.  She is advised to continue levothyroxine 125 mcg p.o. daily before breakfast.     - We discussed about the  correct intake of her thyroid hormone, on empty stomach at fasting, with water, separated by at least 30 minutes from breakfast and other medications,  and separated by more than 4 hours from calcium, iron, multivitamins, acid reflux medications (PPIs). -Patient is made aware of the fact that thyroid hormone replacement is needed for life, dose to be adjusted by periodic monitoring of thyroid function tests.   4)  Osteoporosis: she continues to tolerate her Prolia.  She will receive her next injection today and during her next visit in 6 months.  She is well-informed that Prolia injection will not be interrupted without appropriate replacement to avoid withdrawal fracture.     - I advised patient to maintain close follow up with Babs Sciara, MD for primary care needs.   I spent  26  minutes in the care of the patient today including review of labs from CMP, Lipids, Thyroid Function, Hematology (current and previous including abstractions from other facilities); face-to-face time discussing  her blood glucose readings/logs, discussing hypoglycemia and hyperglycemia episodes and symptoms, medications doses, her options of short and long term treatment based on the latest standards of care / guidelines;  discussion about incorporating lifestyle medicine;  and documenting the encounter. Risk reduction counseling performed per USPSTF guidelines to reduce  obesity and cardiovascular risk factors.     Please refer to Patient Instructions for Blood Glucose Monitoring and Insulin/Medications Dosing Guide"  in media tab for additional information. Please  also refer to " Patient Self Inventory" in the Media  tab for reviewed elements of pertinent patient history.  Harrie Jeans participated in the discussions, expressed understanding, and voiced agreement with the above plans.  All questions were answered to her satisfaction. she is encouraged to contact clinic should she have any questions or  concerns prior to her return visit.   Follow up plan: Return in about  6 months (around 06/13/2023) for Fasting Labs  in AM B4 8, A1c -NV, Prolia Today and Prolia NV.   Marquis Lunch, MD [facility] [address] [place], [place] [address] Phone: [phone]  Fax: [phone]     12/14/2022, 5:59 PM  This note was partially dictated with voice recognition software. Similar sounding words can be transcribed inadequately or may not  be corrected upon review.

## 2022-12-14 NOTE — Progress Notes (Signed)
Pt seen today for follow up with Dr.Nida today. Pt also scheduled for prolia injection. Pt provided her prolia. Prolia 60mg  given SQ in upper L arm without difficulty. Pt waited the 15 minute post injection wait time without any reactions noted.

## 2022-12-18 ENCOUNTER — Other Ambulatory Visit: Payer: Self-pay | Admitting: "Endocrinology

## 2022-12-18 DIAGNOSIS — E1122 Type 2 diabetes mellitus with diabetic chronic kidney disease: Secondary | ICD-10-CM

## 2023-01-05 DIAGNOSIS — R350 Frequency of micturition: Secondary | ICD-10-CM | POA: Diagnosis not present

## 2023-01-12 DIAGNOSIS — E1122 Type 2 diabetes mellitus with diabetic chronic kidney disease: Secondary | ICD-10-CM | POA: Diagnosis not present

## 2023-01-14 ENCOUNTER — Ambulatory Visit (INDEPENDENT_AMBULATORY_CARE_PROVIDER_SITE_OTHER): Payer: PPO

## 2023-01-14 VITALS — Ht 59.0 in | Wt 220.0 lb

## 2023-01-14 DIAGNOSIS — Z789 Other specified health status: Secondary | ICD-10-CM

## 2023-01-14 DIAGNOSIS — Z0000 Encounter for general adult medical examination without abnormal findings: Secondary | ICD-10-CM | POA: Diagnosis not present

## 2023-01-14 NOTE — Patient Instructions (Signed)
[name] , Thank you for taking time to come for your Medicare Wellness Visit. I appreciate your ongoing commitment to your health goals. Please review the following plan we discussed and let me know if I can assist you in the future.   Referrals/Orders/Follow-Ups/Clinician Recommendations:  Next Medicare Annual Wellness Visit: January 20, 2024 at 1:10 pm virtual visit  A referral has been placed for you to see if there are any additional resources to help you with one of the following:     []   Transportation Needs   []   Utility Needs   []   Food Insecurity   []  Assistance with daily activities such as bath, dressing, and managing your medications   []  Housing Insecurity   [x]  Assistance with your medications-We discussed having your medications bubble wrapped to make it easier to manage and remember to take.   If you haven't heard from anyone within the next 7 business days, please call them and let them know a referral has been placed  Concierge Line: [phone]  You have an order for:  []   2D Mammogram  []   3D Mammogram  [x]   Bone Density   []   Lung Cancer Screening  Please call for appointment:   [facility] [address] [place], [place] [address] [phone]  Make sure to wear two-piece clothing.  No lotions powders or deodorants the day of the appointment Make sure to bring picture ID and insurance card.  Bring list of medications you are currently taking including any supplements.   Schedule your [place] screening mammogram through MyChart!   Log into your MyChart account.  Go to 'Visit' (or 'Appointments' if on mobile App) --> Schedule an Appointment  Under 'Select a Reason for Visit' choose the Mammogram Screening option.  Complete the pre-visit questions and select the time and place that best fits your schedule.    This is a list of the screening recommended for you and due dates:  Health Maintenance  Topic  Date Due   Eye exam for diabetics  05/21/2022   COVID-19 Vaccine (4 - 2023-24 season) 11/28/2022   Flu Shot  06/27/2023*   Complete foot exam   04/27/2023   Hemoglobin A1C  06/13/2023   Yearly kidney health urinalysis for diabetes  08/31/2023   Mammogram  10/21/2023   Yearly kidney function blood test for diabetes  12/13/2023   Medicare Annual Wellness Visit  01/14/2024   DTaP/Tdap/Td vaccine (2 - Td or Tdap) 04/18/2030   Pneumonia Vaccine  Completed   DEXA scan (bone density measurement)  Completed   Hepatitis C Screening  Completed   Zoster (Shingles) Vaccine  Completed   HPV Vaccine  Aged Out   Colon Cancer Screening  Discontinued  *Topic was postponed. The date shown is not the original due date.    Advanced directives: (ACP Link)Information on Advanced Care Planning can be found at [facility] of [facility] Advance Health Care Directives Advance Health Care Directives ([email or web address]   Next Medicare Annual Wellness Visit scheduled for next year: Yes  Preventive Care 65 Years and Older, Female Preventive care refers to lifestyle choices and visits with your health care provider that can promote health and wellness. Preventive care visits are also called wellness exams. What can I expect for my preventive care visit? Counseling Your health care provider may ask you questions about your: Medical history, including: Past medical problems. Family medical history. Pregnancy and menstrual history. History of falls.  Current health, including: Memory and ability to understand (cognition). Emotional well-being. Home life and relationship well-being. Sexual activity and sexual health. Lifestyle, including: Alcohol, nicotine or tobacco, and drug use. Access to firearms. Diet, exercise, and sleep habits. Work and work Astronomer. Sunscreen use. Safety issues such as seatbelt and bike helmet use. Physical exam Your health care provider will check your: Height and weight.  These may be used to calculate your BMI (body mass index). BMI is a measurement that tells if you are at a healthy weight. Waist circumference. This measures the distance around your waistline. This measurement also tells if you are at a healthy weight and may help predict your risk of certain diseases, such as type 2 diabetes and high blood pressure. Heart rate and blood pressure. Body temperature. Skin for abnormal spots. What immunizations do I need?  Vaccines are usually given at various ages, according to a schedule. Your health care provider will recommend vaccines for you based on your age, medical history, and lifestyle or other factors, such as travel or where you work. What tests do I need? Screening Your health care provider may recommend screening tests for certain conditions. This may include: Lipid and cholesterol levels. Hepatitis C test. Hepatitis B test. HIV (human immunodeficiency virus) test. STI (sexually transmitted infection) testing, if you are at risk. Lung cancer screening. Colorectal cancer screening. Diabetes screening. This is done by checking your blood sugar (glucose) after you have not eaten for a while (fasting). Mammogram. Talk with your health care provider about how often you should have regular mammograms. BRCA-related cancer screening. This may be done if you have a family history of breast, ovarian, tubal, or peritoneal cancers. Bone density scan. This is done to screen for osteoporosis. Talk with your health care provider about your test results, treatment options, and if necessary, the need for more tests. Follow these instructions at home: Eating and drinking  Eat a diet that includes fresh fruits and vegetables, whole grains, lean protein, and low-fat dairy products. Limit your intake of foods with high amounts of sugar, saturated fats, and salt. Take vitamin and mineral supplements as recommended by your health care provider. Do not drink alcohol  if your health care provider tells you not to drink. If you drink alcohol: Limit how much you have to 0-1 drink a day. Know how much alcohol is in your drink. In the [place] one drink equals one 12 oz bottle of beer (355 mL), one 5 oz glass of wine (148 mL), or one 1 oz glass of hard liquor (44 mL). Lifestyle Brush your teeth every morning and night with fluoride toothpaste. Floss one time each day. Exercise for at least 30 minutes 5 or more days each week. Do not use any products that contain nicotine or tobacco. These products include cigarettes, chewing tobacco, and vaping devices, such as e-cigarettes. If you need help quitting, ask your health care provider. Do not use drugs. If you are sexually active, practice safe sex. Use a condom or other form of protection in order to prevent STIs. Take aspirin only as told by your health care provider. Make sure that you understand how much to take and what form to take. Work with your health care provider to find out whether it is safe and beneficial for you to take aspirin daily. Ask your health care provider if you need to take a cholesterol-lowering medicine (statin). Find healthy ways to manage stress, such as: Meditation, yoga, or listening to music. Journaling. Talking  to a trusted person. Spending time with friends and family. Minimize exposure to UV radiation to reduce your risk of skin cancer. Safety Always wear your seat belt while driving or riding in a vehicle. Do not drive: If you have been drinking alcohol. Do not ride with someone who has been drinking. When you are tired or distracted. While texting. If you have been using any mind-altering substances or drugs. Wear a helmet and other protective equipment during sports activities. If you have firearms in your house, make sure you follow all gun safety procedures. What's next? Visit your health care provider once a year for an annual wellness visit. Ask your health care  provider how often you should have your eyes and teeth checked. Stay up to date on all vaccines. This information is not intended to replace advice given to you by your health care provider. Make sure you discuss any questions you have with your health care provider. Document Revised: 09/10/2020 Document Reviewed: 09/10/2020 Elsevier Patient Education  2024 [employer]. Understanding Your Risk for Falls Millions of people have serious injuries from falls each year. It is important to understand your risk of falling. Talk with your health care provider about your risk and what you can do to lower it. If you do have a serious fall, make sure to tell your provider. Falling once raises your risk of falling again. How can falls affect me? Serious injuries from falls are common. These include: Broken bones, such as hip fractures. Head injuries, such as traumatic brain injuries (TBI) or concussions. A fear of falling can cause you to avoid activities and stay at home. This can make your muscles weaker and raise your risk for a fall. What can increase my risk? There are a number of risk factors that increase your risk for falling. The more risk factors you have, the higher your risk of falling. Serious injuries from a fall happen most often to people who are older than 78 years old. Teenagers and young adults ages 29-29 are also at higher risk. Common risk factors include: Weakness in the lower body. Being generally weak or confused due to long-term (chronic) illness. Dizziness or balance problems. Poor vision. Medicines that cause dizziness or drowsiness. These may include: Medicines for your blood pressure, heart, anxiety, insomnia, or swelling (edema). Pain medicines. Muscle relaxants. Other risk factors include: Drinking alcohol. Having had a fall in the past. Having foot pain or wearing improper footwear. Working at a dangerous job. Having any of the following in your home: Tripping  hazards, such as floor clutter or loose rugs. Poor lighting. Pets. Having dementia or memory loss. What actions can I take to lower my risk of falling?     Physical activity Stay physically fit. Do strength and balance exercises. Consider taking a regular class to build strength and balance. Yoga and tai chi are good options. Vision Have your eyes checked every year and your prescription for glasses or contacts updated as needed. Shoes and walking aids Wear non-skid shoes. Wear shoes that have rubber soles and low heels. Do not wear high heels. Do not walk around the house in socks or slippers. Use a cane or walker as told by your provider. Home safety Attach secure railings on both sides of your stairs. Install grab bars for your bathtub, shower, and toilet. Use a non-skid mat in your bathtub or shower. Attach bath mats securely with double-sided, non-slip rug tape. Use good lighting in all rooms. Keep a flashlight near  your bed. Make sure there is a clear path from your bed to the bathroom. Use night-lights. Do not use throw rugs. Make sure all carpeting is taped or tacked down securely. Remove all clutter from walkways and stairways, including extension cords. Repair uneven or broken steps and floors. Avoid walking on icy or slippery surfaces. Walk on the grass instead of on icy or slick sidewalks. Use ice melter to get rid of ice on walkways in the winter. Use a cordless phone. Questions to ask your health care provider Can you help me check my risk for a fall? Do any of my medicines make me more likely to fall? Should I take a vitamin D supplement? What exercises can I do to improve my strength and balance? Should I make an appointment to have my vision checked? Do I need a bone density test to check for weak bones (osteoporosis)? Would it help to use a cane or a walker? Where to find more information Centers for Disease Control and Prevention, STEAD[employer]:  TonerPromos.no Community-Based Fall Prevention Programs: TonerPromos.no [employer] on Aging: BaseRingTones.pl Contact a health care provider if: You fall at home. You are afraid of falling at home. You feel weak, drowsy, or dizzy. This information is not intended to replace advice given to you by your health care provider. Make sure you discuss any questions you have with your health care provider. Document Revised: 11/16/2021 Document Reviewed: 11/16/2021 Elsevier Patient Education  2024 Elsevier Inc. Bone Density Test A bone density test uses a type of X-ray to measure the amount of calcium and other minerals in a person's bones. It can measure bone density in the hip and the spine. The test is similar to having a regular X-ray. This test may also be called: Bone densitometry. Bone mineral density test. Dual-energy X-ray absorptiometry (DEXA). You may have this test to: Diagnose a condition that causes weak or thin bones (osteoporosis). Screen you for osteoporosis. Predict your risk for a broken bone (fracture). Determine how well your osteoporosis treatment is working. Tell a health care provider about: Any allergies you have. All medicines you are taking, including vitamins, herbs, eye drops, creams, and over-the-counter medicines. Any problems you or family members have had with anesthetic medicines. Any blood disorders you have. Any surgeries you have had. Any medical conditions you have. Whether you are pregnant or may be pregnant. Any medical tests you have had within the past 14 days that used contrast material. What are the risks? Generally, this is a safe test. However, it does expose you to a small amount of radiation, which can slightly increase your cancer risk. What happens before the test? Do not take any calcium supplements within the 24 hours before your test. You will need to remove all metal jewelry, eyeglasses, removable dental appliances, and any other metal objects on  your body. What happens during the test?  You will lie down on an exam table. There will be an X-ray generator below you and an imaging device above you. Other devices, such as boxes or braces, may be used to position your body properly for the scan. The machine will slowly scan your body. You will need to keep very still while the machine does the scan. The images will show up on a screen in the room. Images will be examined by a specialist after your test is finished. The procedure may vary among health care providers and hospitals. What can I expect after the test? It is up to you  to get the results of your test. Ask your health care provider, or the department that is doing the test, when your results will be ready. Summary A bone density test is an imaging test that uses a type of X-ray to measure the amount of calcium and other minerals in your bones. The test may be used to diagnose or screen you for a condition that causes weak or thin bones (osteoporosis), predict your risk for a broken bone (fracture), or determine how well your osteoporosis treatment is working. Do not take any calcium supplements within 24 hours before your test. Ask your health care provider, or the department that is doing the test, when your results will be ready. This information is not intended to replace advice given to you by your health care provider. Make sure you discuss any questions you have with your health care provider. Document Revised: 11/26/2020 Document Reviewed: 08/30/2019 Elsevier Patient Education  2024 [employer].

## 2023-01-14 NOTE — Progress Notes (Signed)
Because this visit was a virtual/telehealth visit,  certain criteria was not obtained, such a blood pressure, CBG if applicable, and timed get up and go. Any medications not marked as "taking" were not mentioned during the medication reconciliation part of the visit. Any vitals not documented were not able to be obtained due to this being a telehealth visit or patient was unable to self-report a recent blood pressure reading due to a lack of equipment at home via telehealth. Vitals that have been documented are verbally provided by the patient.   Subjective:   Kristen Mack is a 77 y.o. female who presents for Medicare Annual (Subsequent) preventive examination.  Visit Complete: Virtual I connected with  Kristen Mack on 01/14/23 by a audio enabled telemedicine application and verified that I am speaking with the correct person using two identifiers.  Patient Location: Home  Provider Location: Home Office  I discussed the limitations of evaluation and management by telemedicine. The patient expressed understanding and agreed to proceed.  Vital Signs: Because this visit was a virtual/telehealth visit, some criteria may be missing or patient reported. Any vitals not documented were not able to be obtained and vitals that have been documented are patient reported.  Patient Medicare AWV questionnaire was completed by the patient on na; I have confirmed that all information answered by patient is correct and no changes since this date.  Cardiac Risk Factors include: advanced age (>25men, >55 women);diabetes mellitus;dyslipidemia;hypertension;sedentary lifestyle;obesity (BMI >30kg/m2)     Objective:    Today's Vitals   01/14/23 1419 01/14/23 1421  Weight: 220 lb (99.8 kg)   Height: 4\' 11"  (1.499 m)   PainSc:  5    Body mass index is 44.43 kg/m.     01/14/2023    2:19 PM 01/05/2022    1:58 PM 12/11/2021   11:00 PM 12/11/2021   11:39 AM 08/17/2021    1:04 PM 08/06/2021     3:30 PM 07/15/2021   11:07 AM  Advanced Directives  Does Patient Have a Medical Advance Directive? No No No No No No No  Would patient like information on creating a medical advance directive? No - Patient declined No - Patient declined No - Patient declined  No - Patient declined No - Patient declined No - Patient declined    Current Medications (verified) Outpatient Encounter Medications as of 01/14/2023  Medication Sig   albuterol (PROVENTIL) (2.5 MG/3ML) 0.083% nebulizer solution Take 3 mLs (2.5 mg total) by nebulization every 6 (six) hours as needed for wheezing or shortness of breath.   albuterol (VENTOLIN HFA) 108 (90 Base) MCG/ACT inhaler Inhale 2 puffs into the lungs every 4 (four) hours as needed. INHALE 2 PUFFS INTO LUNGS EVERY 6 HOURS AS NEEDED FOR SHORTNESS OF BREATH   Azelastine HCl 137 MCG/SPRAY SOLN USE 2 SPRAY(S) IN EACH NOSTRIL TWICE DAILY   B Complex Vitamins (B COMPLEX 100 PO) Take 1 tablet by mouth daily.   blood glucose meter kit and supplies Dispense based on patient and insurance preference. Use to test sugars BID  (FOR ICD-10 E10.9, E11.9).   buPROPion (WELLBUTRIN XL) 300 MG 24 hr tablet Take 1 tablet by mouth once daily   cephALEXin (KEFLEX) 500 MG capsule Take 500 mg by mouth 2 (two) times daily.   Chromium Picolinate 1000 MCG TABS Take 1 tablet by mouth daily.   Coenzyme Q10 (COQ10) 200 MG CAPS Take 200 mg by mouth daily.   Continuous Blood Gluc Receiver (FREESTYLE LIBRE 2 READER) DEVI  As directed   Continuous Blood Gluc Sensor (FREESTYLE LIBRE 2 SENSOR) MISC 1 Piece by Does not apply route every 14 (fourteen) days.   Cyanocobalamin (B-12) 1000 MCG SUBL Place 1,000 mcg under the tongue daily.   denosumab (PROLIA) 60 MG/ML SOSY injection INJECT 60 MG SUBCUTANEOUSLY ONCE FOR 1 DOSE   DIGESTIVE ENZYMES PO Take 1 tablet by mouth 3 (three) times daily.    gabapentin (NEURONTIN) 100 MG capsule Take 1 capsule by mouth twice daily   glucose blood test strip Use to check  blood glucose four times daily as instructed   LEVEMIR FLEXPEN 100 UNIT/ML FlexPen INJECT 46 UNITS SUBCUTANEOUSLY AT BEDTIME   levothyroxine (SYNTHROID) 125 MCG tablet TAKE 1 TABLET BY MOUTH ONCE DAILY BEFORE BREAKFAST   Lysine HCl 500 MG TABS Take 500 mg by mouth daily.   Magnesium 250 MG TABS Take 250 mg by mouth 2 (two) times daily.   meclizine (ANTIVERT) 25 MG tablet Take 25 mg by mouth 3 (three) times daily as needed for dizziness.   montelukast (SINGULAIR) 10 MG tablet TAKE 1 TABLET BY MOUTH AT BEDTIME   OVER THE COUNTER MEDICATION Take 1 Scoop by mouth 3 (three) times daily with meals. Fennel seeds   OVER THE COUNTER MEDICATION Choline and inisotol   RELION PEN NEEDLES 31G X 6 MM MISC USE TO INJECT INSULIN ONCE DAILY   rosuvastatin (CRESTOR) 5 MG tablet TAKE 1 TABLET BY MOUTH ON MONDAY, WEDNESDAY,  FRIDAY AND SATURDAY   spironolactone (ALDACTONE) 25 MG tablet Take 1/2 (one-half) tablet by mouth once daily   TART CHERRY PO Take 1 tablet by mouth 3 (three) times daily.   tiZANidine (ZANAFLEX) 2 MG tablet TAKE 1 TABLET BY MOUTH AT BEDTIME AS NEEDED -  MAY  CAUSE  DROWSINESS   Vitamin A 3 MG (10000 UT) TABS Take 10,000 Units by mouth daily.   vitamin E 180 MG (400 UNITS) capsule Take 800 Units by mouth daily.    Facility-Administered Encounter Medications as of 01/14/2023  Medication   gentamicin (GARAMYCIN) 330 mg in dextrose 5 % 100 mL IVPB    Allergies (verified) Latex, Ambien [zolpidem tartrate], Cefzil [cefprozil], Citalopram, Codeine, Demerol [meperidine], Formaldehyde, Sulfa antibiotics, Zolpidem, and Celexa [citalopram hydrobromide]   History: Past Medical History:  Diagnosis Date   ADHD (attention deficit hyperactivity disorder) 2007   Adrenal adenoma, left    followed by dr nida   Arthritis    Asthma    followed by pcp   CFS (chronic fatigue syndrome)    CKD (chronic kidney disease), stage III [facility])    nephrologist---  dr sanford   Depression    Edema of both lower  extremities    Fatty liver disease, nonalcoholic    Fibromyalgia    GERD (gastroesophageal reflux disease)    Hiatal hernia    History of adenomatous polyp of colon    History of cardiac murmur    until age 8- pt has had echocardiograms   History of TIA (transient ischemic attack)    per CT scan    Hyperlipidemia    Hypertension    Hypothyroidism    endocrinologist-- dr g. nida   IBS (irritable bowel syndrome)    Meniere disease    OSA on CPAP    per pt uses nightly   Osteopenia    Pancreas cyst    followed by pcp   PMB (postmenopausal bleeding)    PVC's (premature ventricular contractions)    cardiologist--- dr Flora Lipps  Type 2 diabetes mellitus treated with insulin [facility])    endocrinologist-- dr g. nida;    (08-14-2021  per pt checks blood sugar QID ,  fasting average --- 73-150)   Urinary incontinence    Wears glasses    Wears partial dentures    upper   Past Surgical History:  Procedure Laterality Date   CERVICAL POLYPECTOMY N/A 08/17/2021   Procedure: BIOPSY OF ENDOMETRIUM AND OR POLYPECTOMY;  Surgeon: Essie Hart, MD;  Location: [facility];  Service: Gynecology;  Laterality: N/A;   CESAREAN SECTION  1977  &  1979   CHOLECYSTECTOMY  1995   and  REPAIR UMBILICAL HERNIA   COLONOSCOPY N/A 04/19/2013   Procedure: COLONOSCOPY;  Surgeon: Malissa Hippo, MD;  [facility];  Service: Endoscopy;  Laterality: N/A;     DILITATION & CURRETTAGE/HYSTROSCOPY WITH NOVASURE ABLATION N/A 08/17/2021   Procedure: DILATATION & CURETTAGE/HYSTEROSCOPY;  Surgeon: Essie Hart, MD;  Location: [facility];  Service: Gynecology;  Laterality: N/A;   ESOPHAGOGASTRODUODENOSCOPY  02/08/2003   HERNIA REPAIR  1995   HYSTEROSCOPY WITH D & C  11/14/2002   endometrial polypectomy   HYSTEROSCOPY WITH D & C N/A 04/05/2014   Procedure: DILATATION AND CURETTAGE /HYSTEROSCOPY, ENDOMETRIAL POLYPECTOMY;  Surgeon: Essie Hart, MD;  Location: [facility];   Service: Gynecology;  Laterality: N/A;   LIVER BIOPSY  01/29/2004   benign   TUBAL LIGATION  1979   Family History  Problem Relation Age of Onset   Rectal cancer Brother 76   Diabetes Brother    Ovarian cancer Mother 10   Hypertension Mother    Diabetes Mother    Cancer Mother    Depression Mother    Early death Mother    Obesity Mother    Hypertension Father    Diabetes Father    Depression Father    Hyperlipidemia Father    Stroke Father    Breast cancer Maternal Aunt    Breast cancer Paternal Aunt        four Paternal aunts   Heart disease Paternal Grandfather    Hyperlipidemia Paternal Grandfather    Hypertension Paternal Grandfather    Stroke Paternal Grandfather    Liver disease Sister        x 2; fatty liver   Hearing loss Maternal Grandfather    Hypertension Maternal Grandfather    Heart disease Maternal Grandmother    Hypertension Maternal Grandmother    Obesity Maternal Grandmother    Obesity Paternal Grandmother    ADD / ADHD Daughter    Depression Daughter    ADD / ADHD Daughter    Depression Daughter    Alcohol abuse Maternal Uncle    Depression Maternal Uncle    Drug abuse Maternal Uncle    Early death Maternal Uncle    Alcohol abuse Paternal Uncle    Depression Paternal Uncle    Drug abuse Paternal Uncle    Anxiety disorder Sister    Depression Sister    Hyperlipidemia Sister    Miscarriages / Stillbirths Sister    Cancer Brother    Depression Brother    Early death Brother    Cancer Maternal Aunt    Cancer Maternal Aunt    Cancer Paternal Aunt    Cancer Paternal Aunt    Cancer Paternal Aunt    Cancer Paternal Aunt    Drug abuse Brother    Heart disease Maternal Uncle    Hyperlipidemia Maternal Uncle  Hypertension Maternal Uncle    Hyperlipidemia Sister    Hyperlipidemia Brother    Hyperlipidemia Brother    Hypertension Maternal Uncle    Pancreatic cancer Neg Hx    Esophageal cancer Neg Hx    Social History   Socioeconomic  History   Marital status: Married    Spouse name: Molly Maduro   Number of children: 2   Years of education: Not on file   Highest education level: Not on file  Occupational History   Occupation: retired, Charity fundraiser  Tobacco Use   Smoking status: Former    Current packs/day: 0.00    Average packs/day: 1.5 packs/day for 3.0 years (4.5 ttl pk-yrs)    Types: Cigarettes    Start date: 10/03/1965    Quit date: 10/03/1968    Years since quitting: 54.3    Passive exposure: Past   Smokeless tobacco: Never  Vaping Use   Vaping status: Never Used  Substance and Sexual Activity   Alcohol use: No   Drug use: No   Sexual activity: Not Currently    Birth control/protection: None  Other Topics Concern   Not on file  Social History Narrative   2 daughters, Amy and Marcelino Duster. Amy lives with parents. Marcelino Duster lives in [place].   Social Determinants of Health   Financial Resource Strain: Medium Risk (01/14/2023)   Overall Financial Resource Strain (CARDIA)    Difficulty of Paying Living Expenses: Somewhat hard  Food Insecurity: No Food Insecurity (01/14/2023)   Hunger Vital Sign    Worried About Running Out of Food in the Last Year: Never true    Ran Out of Food in the Last Year: Never true  Transportation Needs: No Transportation Needs (01/14/2023)   PRAPARE - Administrator, Civil Service (Medical): No    Lack of Transportation (Non-Medical): No  Physical Activity: Patient Declined (01/14/2023)   Exercise Vital Sign    Days of Exercise per Week: Patient declined    Minutes of Exercise per Session: Patient declined  Stress: No Stress Concern Present (01/14/2023)   [employer] of Occupational Health - Occupational Stress Questionnaire    Feeling of Stress : Only a little  Social Connections: Moderately Integrated (01/14/2023)   Social Connection and Isolation Panel [NHANES]    Frequency of Communication with Friends and Family: More than three times a week    Frequency of Social  Gatherings with Friends and Family: Once a week    Attends Religious Services: More than 4 times per year    Active Member of [employer] or Organizations: No    Attends Engineer, structural: Never    Marital Status: Married    Tobacco Counseling Counseling given: Not Answered   Clinical Intake:  Pre-visit preparation completed: No  Pain : 0-10 Pain Score: 5  Pain Location: Knee Pain Orientation: Right Pain Descriptors / Indicators: Dull, Aching, Discomfort Pain Onset: More than a month ago Pain Frequency: Intermittent     BMI - recorded: 44.43 Nutritional Status: BMI > 30  Obese Nutritional Risks: None Diabetes: Yes CBG done?: No (telehealth visit. unable to obtain cbg) Did pt. bring in CBG monitor from home?: No  How often do you need to have someone help you when you read instructions, pamphlets, or other written materials from your doctor or pharmacy?: 1 - Never  Interpreter Needed?: No  Information entered by :: Abby Junah Yam, CMA   Activities of Daily Living    01/14/2023    2:46 PM  In your  present state of health, do you have any difficulty performing the following activities:  Hearing? 1  Comment referral placed to audiology for bilateral hearing loss  Vision? 0  Comment sees Dr. Sherryll Burger in [place]  Difficulty concentrating or making decisions? 0  Walking or climbing stairs? 1  Comment uses cane/walker  Dressing or bathing? 0  Doing errands, shopping? 1  Comment no longer drives so daughters take her to where she needs to go.  Preparing Food and eating ? Y  Comment daughter helps with meal prepartion  Using the Toilet? N  In the past six months, have you accidently leaked urine? N  Do you have problems with loss of bowel control? N  Managing your Medications? Y  Comment referral placed for [facility] to see about getting patients medications bubble wrapped. patient is in agreement with plan  Managing your Finances? N  Housekeeping or managing your  Housekeeping? Y  Comment daughter does most of the housekeeping    Patient Care Team: Babs Sciara, MD as PCP - General (Family Medicine) O'Neal, Ronnald Ramp, MD as PCP - Cardiology (Cardiology)  Indicate any recent Medical Services you may have received from other than [facility] providers in the past year (date may be approximate).     Assessment:   This is a routine wellness examination for [place].  Hearing/Vision screen Hearing Screening - Comments:: Patient complains of hearing bilaterally. Referral to audiology placed today for patient  Vision Screening - Comments:: Wears rx glasses - up to date with routine eye exams with Dr. Sherryll Burger in [place].    Goals Addressed             This Visit's Progress    Patient Stated       I would really like to be able to go to my sisters birthday party in the first of November       Depression Screen    01/14/2023    2:26 PM 10/27/2022    1:46 PM 08/25/2022    1:27 PM 04/26/2022    1:48 PM 04/02/2022    3:58 PM 01/22/2022    1:10 PM 01/05/2022    1:54 PM  PHQ 2/9 Scores  PHQ - 2 Score 1 0 2 1 1  0 0  PHQ- 9 Score 8 3 6 5 4 7  0    Fall Risk    01/14/2023    2:46 PM 10/27/2022    1:46 PM 04/26/2022    1:48 PM 01/22/2022    1:09 PM 01/05/2022    1:59 PM  Fall Risk   Falls in the past year? 1 0 0 1 1  Number falls in past yr: 1 0 0 1 1  Injury with Fall? 1 0 0 1 1  Risk for fall due to : History of fall(s);Impaired balance/gait;Orthopedic patient  No Fall Risks Impaired balance/gait History of fall(s)  Follow up Education provided;Falls prevention discussed  Falls evaluation completed Falls evaluation completed;Education provided;Falls prevention discussed Falls evaluation completed;Falls prevention discussed    MEDICARE RISK AT HOME: Medicare Risk at Home Any stairs in or around the home?: Yes If so, are there any without handrails?: No Home free of loose throw rugs in walkways, pet beds, electrical cords, etc?:  Yes Adequate lighting in your home to reduce risk of falls?: Yes Life alert?: No Use of a cane, walker or w/c?: Yes Grab bars in the bathroom?: Yes Shower chair or bench in shower?: Yes Elevated toilet seat or a handicapped toilet?: Yes  TIMED UP AND GO:  Was the test performed?  No    Cognitive Function:        01/14/2023    2:26 PM 01/05/2022    2:09 PM 12/30/2020    2:03 PM  6CIT Screen  What Year? 0 points 0 points 0 points  What month? 0 points 0 points 0 points  What time? 0 points 0 points 0 points  Count back from 20 0 points 0 points 0 points  Months in reverse 0 points 0 points 0 points  Repeat phrase 0 points 4 points 0 points  Total Score 0 points 4 points 0 points    Immunizations Immunization History  Administered Date(s) Administered   Fluad Quad(high Dose 65+) 01/22/2020, 02/10/2021, 01/22/2022   Influenza, High Dose Seasonal PF 04/15/2018, 01/13/2019   Influenza,inj,Quad PF,6+ Mos 03/02/2016, 03/03/2017   Influenza-Unspecified 04/15/2018, 01/15/2019, 01/23/2020   Moderna Sars-Covid-2 Vaccination 05/23/2019, 06/19/2019, 03/05/2020   Pneumococcal Conjugate-13 05/22/2014   Pneumococcal Polysaccharide-23 03/03/2017   Tdap 04/18/2020   Zoster Recombinant(Shingrix) 12/08/2018, 04/27/2019    TDAP status: Up to date  Flu Vaccine status: Due, Education has been provided regarding the importance of this vaccine. Advised may receive this vaccine at local pharmacy or Health Dept. Aware to provide a copy of the vaccination record if obtained from local pharmacy or Health Dept. Verbalized acceptance and understanding.  Pneumococcal vaccine status: Up to date  Covid-19 vaccine status: Information provided on how to obtain vaccines.   Qualifies for Shingles Vaccine? No   Zostavax completed No   Shingrix Completed?: Yes  Screening Tests Health Maintenance  Topic Date Due   OPHTHALMOLOGY EXAM  05/21/2022   COVID-19 Vaccine (4 - 2023-24 season) 11/28/2022    Medicare Annual Wellness (AWV)  01/06/2023   INFLUENZA VACCINE  06/27/2023 (Originally 10/28/2022)   FOOT EXAM  04/27/2023   HEMOGLOBIN A1C  06/13/2023   Diabetic kidney evaluation - Urine ACR  08/31/2023   Diabetic kidney evaluation - eGFR measurement  12/13/2023   DTaP/Tdap/Td (2 - Td or Tdap) 04/18/2030   Pneumonia Vaccine 31+ Years old  Completed   DEXA SCAN  Completed   Hepatitis C Screening  Completed   Zoster Vaccines- Shingrix  Completed   HPV VACCINES  Aged Out   Colonoscopy  Discontinued    Health Maintenance  Health Maintenance Due  Topic Date Due   OPHTHALMOLOGY EXAM  05/21/2022   COVID-19 Vaccine (4 - 2023-24 season) 11/28/2022   Medicare Annual Wellness (AWV)  01/06/2023    Colorectal cancer screening: No longer required.   Mammogram status: Completed 10/21/2022. Repeat every year  Bone Density status: Ordered 12/14/2022 by Dr. Fransico Him. . Pt provided with contact info and advised to call to schedule appt.  Lung Cancer Screening: (Low Dose CT Chest recommended if Age 57-80 years, 20 pack-year currently smoking OR have quit w/in 15years.) does not qualify.   Lung Cancer Screening Referral: na  Additional Screening:  Hepatitis C Screening: does not qualify; Completed 09/19/2013  Vision Screening: Recommended annual ophthalmology exams for early detection of glaucoma and other disorders of the eye. Is the patient up to date with their annual eye exam?  No  Who is the provider or what is the name of the office in which the patient attends annual eye exams? Dr. Sherryll Burger in [place] If pt is not established with a provider, would they like to be referred to a provider to establish care? No .   Dental Screening: Recommended annual dental exams for proper  oral hygiene  Diabetic Foot Exam: Diabetic Foot Exam: Completed 04/26/2022  Community Resource Referral / Chronic Care Management: CRR required this visit?  Yes   CCM required this visit?  No     Plan:     I have  personally reviewed and noted the following in the patient's chart:   Medical and social history Use of alcohol, tobacco or illicit drugs  Current medications and supplements including opioid prescriptions. Patient is not currently taking opioid prescriptions. Functional ability and status Nutritional status Physical activity Advanced directives List of other physicians Hospitalizations, surgeries, and ER visits in previous 12 months Vitals Screenings to include cognitive, depression, and falls Referrals and appointments  In addition, I have reviewed and discussed with patient certain preventive protocols, quality metrics, and best practice recommendations. A written personalized care plan for preventive services as well as general preventive health recommendations were provided to patient.     Jordan Hawks Bubber Rothert, CMA   01/14/2023   After Visit Summary: (MyChart) Due to this being a telephonic visit, the after visit summary with patients personalized plan was offered to patient via MyChart   Nurse Notes: see routing comment

## 2023-01-18 ENCOUNTER — Telehealth: Payer: Self-pay

## 2023-01-18 NOTE — Progress Notes (Signed)
Care Guide Note  01/18/2023 Name: Kristen Mack MRN: [id number] DOB: [birth year]  Referred by: Babs Sciara, MD Reason for referral : Care Coordination (Outreach to schedule with Pharm d )   Kristen Mack is a 77 y.o. year old female who is a primary care patient of Luking, Jonna Coup, MD. [name] was referred to the pharmacist for assistance related to HTN.    Successful contact was made with the patient to discuss pharmacy services including being ready for the pharmacist to call at least 5 minutes before the scheduled appointment time, to have medication bottles and any blood sugar or blood pressure readings ready for review. The patient agreed to meet with the pharmacist via with the pharmacist via telephone visit on (date/time).  01/19/2023  Penne Lash, RMA Care Guide [facility]  [place], [place] [address] Direct Dial: [phone] [email or web address]

## 2023-01-19 ENCOUNTER — Other Ambulatory Visit: Payer: Self-pay | Admitting: Pharmacist

## 2023-01-20 ENCOUNTER — Other Ambulatory Visit: Payer: Self-pay | Admitting: "Endocrinology

## 2023-01-20 ENCOUNTER — Telehealth: Payer: Self-pay | Admitting: Family Medicine

## 2023-01-20 DIAGNOSIS — I129 Hypertensive chronic kidney disease with stage 1 through stage 4 chronic kidney disease, or unspecified chronic kidney disease: Secondary | ICD-10-CM

## 2023-01-20 NOTE — Telephone Encounter (Signed)
Dr. Fransico Him recently did blood work and has future blood work ordered that we would typically recommend no additional lab work necessary currently

## 2023-01-20 NOTE — Telephone Encounter (Signed)
Patient wanted to know if she needed labs or urinalysis done before her appointment next week.

## 2023-01-21 ENCOUNTER — Encounter: Payer: Self-pay | Admitting: *Deleted

## 2023-01-21 ENCOUNTER — Telehealth: Payer: Self-pay | Admitting: Nurse Practitioner

## 2023-01-21 NOTE — Telephone Encounter (Signed)
Patient notified via mychart

## 2023-01-21 NOTE — Telephone Encounter (Signed)
See my chart message- patient does not need labs at this time- patient is aware

## 2023-01-21 NOTE — Telephone Encounter (Signed)
Patient needing labs for physical in december

## 2023-01-22 DIAGNOSIS — K0889 Other specified disorders of teeth and supporting structures: Secondary | ICD-10-CM | POA: Diagnosis not present

## 2023-01-22 DIAGNOSIS — M26609 Unspecified temporomandibular joint disorder, unspecified side: Secondary | ICD-10-CM | POA: Diagnosis not present

## 2023-01-24 ENCOUNTER — Other Ambulatory Visit: Payer: Self-pay | Admitting: Family Medicine

## 2023-01-24 DIAGNOSIS — G4733 Obstructive sleep apnea (adult) (pediatric): Secondary | ICD-10-CM | POA: Diagnosis not present

## 2023-01-24 DIAGNOSIS — K219 Gastro-esophageal reflux disease without esophagitis: Secondary | ICD-10-CM | POA: Diagnosis not present

## 2023-01-24 DIAGNOSIS — N2581 Secondary hyperparathyroidism of renal origin: Secondary | ICD-10-CM | POA: Diagnosis not present

## 2023-01-24 DIAGNOSIS — I129 Hypertensive chronic kidney disease with stage 1 through stage 4 chronic kidney disease, or unspecified chronic kidney disease: Secondary | ICD-10-CM | POA: Diagnosis not present

## 2023-01-24 DIAGNOSIS — K7581 Nonalcoholic steatohepatitis (NASH): Secondary | ICD-10-CM | POA: Diagnosis not present

## 2023-01-24 DIAGNOSIS — N1832 Chronic kidney disease, stage 3b: Secondary | ICD-10-CM | POA: Diagnosis not present

## 2023-01-24 DIAGNOSIS — R350 Frequency of micturition: Secondary | ICD-10-CM | POA: Diagnosis not present

## 2023-01-24 DIAGNOSIS — E1122 Type 2 diabetes mellitus with diabetic chronic kidney disease: Secondary | ICD-10-CM | POA: Diagnosis not present

## 2023-01-25 ENCOUNTER — Telehealth: Payer: Self-pay | Admitting: "Endocrinology

## 2023-01-25 ENCOUNTER — Other Ambulatory Visit: Payer: Self-pay | Admitting: "Endocrinology

## 2023-01-25 ENCOUNTER — Ambulatory Visit: Payer: PPO | Admitting: Family Medicine

## 2023-01-25 DIAGNOSIS — N183 Chronic kidney disease, stage 3 unspecified: Secondary | ICD-10-CM

## 2023-01-25 LAB — LAB REPORT - SCANNED
Albumin, Urine POC: 74.2
Albumin/Creatinine Ratio, Urine, POC: 127
Creatinine, POC: 58.3 mg/dL

## 2023-01-25 NOTE — Telephone Encounter (Signed)
Pharmacy states Levemir is no longer available, and they have already sent a request for something else.  She is currently on her last syringe.

## 2023-01-25 NOTE — Telephone Encounter (Signed)
Sent Evaristo Bury to pts pharmacy.

## 2023-01-26 ENCOUNTER — Ambulatory Visit (INDEPENDENT_AMBULATORY_CARE_PROVIDER_SITE_OTHER): Payer: PPO | Admitting: Family Medicine

## 2023-01-26 ENCOUNTER — Encounter: Payer: Self-pay | Admitting: Family Medicine

## 2023-01-26 VITALS — BP 136/88 | HR 84 | Temp 97.7°F | Ht 59.0 in | Wt 220.8 lb

## 2023-01-26 DIAGNOSIS — Z794 Long term (current) use of insulin: Secondary | ICD-10-CM | POA: Diagnosis not present

## 2023-01-26 DIAGNOSIS — I129 Hypertensive chronic kidney disease with stage 1 through stage 4 chronic kidney disease, or unspecified chronic kidney disease: Secondary | ICD-10-CM | POA: Diagnosis not present

## 2023-01-26 DIAGNOSIS — N183 Chronic kidney disease, stage 3 unspecified: Secondary | ICD-10-CM | POA: Diagnosis not present

## 2023-01-26 DIAGNOSIS — I10 Essential (primary) hypertension: Secondary | ICD-10-CM

## 2023-01-26 DIAGNOSIS — E1122 Type 2 diabetes mellitus with diabetic chronic kidney disease: Secondary | ICD-10-CM

## 2023-01-26 DIAGNOSIS — Z23 Encounter for immunization: Secondary | ICD-10-CM | POA: Diagnosis not present

## 2023-01-26 MED ORDER — ROSUVASTATIN CALCIUM 5 MG PO TABS: ORAL_TABLET | ORAL | 5 refills | Status: DC

## 2023-01-26 MED ORDER — ESTRADIOL 0.1 MG/GM VA CREA: TOPICAL_CREAM | VAGINAL | 6 refills | Status: DC

## 2023-01-28 ENCOUNTER — Telehealth: Payer: Self-pay | Admitting: Pharmacist

## 2023-01-28 DIAGNOSIS — E782 Mixed hyperlipidemia: Secondary | ICD-10-CM

## 2023-01-28 DIAGNOSIS — E876 Hypokalemia: Secondary | ICD-10-CM

## 2023-01-28 NOTE — Telephone Encounter (Signed)
Patient requesting pill packaging from [place] They are unable to package OTC and PRN meds (patient aware) Reviewed schedule of medications with patient  I have pended the following orders for review if you can send to [facility]: Takes medications only twice daily (AM and PM ) -Gabapentin AM/PM -Bupropion AM -Levothyroxine at night (odd but works for patient) -Montelukast PM--needs refills -Rosuvastatin -- AM M,W,F,SAT -OTC: b12, chromium, co q12, lysine, magnesium (will not be packaged) -Spironolactone AM -Tizanidine PM --does take every night   Remaining meds to stay at [facility] per patient request  Thank you! Raynelle Fanning

## 2023-01-31 DIAGNOSIS — E119 Type 2 diabetes mellitus without complications: Secondary | ICD-10-CM | POA: Diagnosis not present

## 2023-01-31 LAB — HM DIABETES EYE EXAM

## 2023-02-02 ENCOUNTER — Encounter: Payer: Self-pay | Admitting: "Endocrinology

## 2023-02-02 NOTE — Telephone Encounter (Signed)
Can you send the pended orders to [facility] [employer] for pill packaging? Thank you!

## 2023-02-03 MED ORDER — MONTELUKAST SODIUM 10 MG PO TABS: 10.0000 mg | ORAL_TABLET | Freq: Every day | ORAL | 0 refills | Status: DC

## 2023-02-03 MED ORDER — BUPROPION HCL ER (XL) 300 MG PO TB24: 300.0000 mg | ORAL_TABLET | Freq: Every day | ORAL | 1 refills | Status: DC

## 2023-02-03 MED ORDER — GABAPENTIN 100 MG PO CAPS: 100.0000 mg | ORAL_CAPSULE | Freq: Two times a day (BID) | ORAL | 5 refills | Status: DC

## 2023-02-03 MED ORDER — ROSUVASTATIN CALCIUM 5 MG PO TABS: ORAL_TABLET | ORAL | 5 refills | Status: DC

## 2023-02-03 MED ORDER — TIZANIDINE HCL 2 MG PO TABS: ORAL_TABLET | ORAL | 5 refills | Status: DC

## 2023-02-03 MED ORDER — SPIRONOLACTONE 25 MG PO TABS: 12.5000 mg | ORAL_TABLET | Freq: Every day | ORAL | 3 refills | Status: DC

## 2023-02-03 MED ORDER — LEVOTHYROXINE SODIUM 125 MCG PO TABS: 125.0000 ug | ORAL_TABLET | Freq: Every evening | ORAL | 0 refills | Status: DC

## 2023-02-03 NOTE — Telephone Encounter (Signed)
done

## 2023-02-07 ENCOUNTER — Other Ambulatory Visit (HOSPITAL_COMMUNITY): Payer: PPO

## 2023-02-11 DIAGNOSIS — N390 Urinary tract infection, site not specified: Secondary | ICD-10-CM | POA: Diagnosis not present

## 2023-02-12 DIAGNOSIS — E1122 Type 2 diabetes mellitus with diabetic chronic kidney disease: Secondary | ICD-10-CM | POA: Diagnosis not present

## 2023-02-15 ENCOUNTER — Ambulatory Visit (HOSPITAL_COMMUNITY)
Admission: RE | Admit: 2023-02-15 | Discharge: 2023-02-15 | Disposition: A | Discharge status: Home / Self Care | Payer: PPO | Source: Ambulatory Visit | Attending: "Endocrinology | Admitting: "Endocrinology

## 2023-02-15 DIAGNOSIS — M810 Age-related osteoporosis without current pathological fracture: Secondary | ICD-10-CM | POA: Diagnosis not present

## 2023-02-15 DIAGNOSIS — Z780 Asymptomatic menopausal state: Secondary | ICD-10-CM | POA: Diagnosis not present

## 2023-02-21 ENCOUNTER — Other Ambulatory Visit: Payer: Self-pay | Admitting: Family Medicine

## 2023-02-22 NOTE — Telephone Encounter (Signed)
Please talk with patient-I am not a fan of utilizing muscle relaxers with older adults because increased risk of falls and injuries I would not recommend being on a muscle relaxer

## 2023-02-28 ENCOUNTER — Ambulatory Visit
Admission: EM | Admit: 2023-02-28 | Discharge: 2023-02-28 | Disposition: A | Discharge status: Home / Self Care | Payer: PPO | Attending: Nurse Practitioner | Admitting: Nurse Practitioner

## 2023-02-28 DIAGNOSIS — J069 Acute upper respiratory infection, unspecified: Secondary | ICD-10-CM | POA: Diagnosis not present

## 2023-02-28 DIAGNOSIS — R300 Dysuria: Secondary | ICD-10-CM | POA: Insufficient documentation

## 2023-02-28 LAB — POCT URINALYSIS DIP (MANUAL ENTRY)
Bilirubin, UA: NEGATIVE
Glucose, UA: NEGATIVE mg/dL
Ketones, POC UA: NEGATIVE mg/dL
Nitrite, UA: POSITIVE — AB
Protein Ur, POC: 30 mg/dL — AB
Spec Grav, UA: 1.015 (ref 1.010–1.025)
Urobilinogen, UA: 0.2 U/dL
pH, UA: 5.5 (ref 5.0–8.0)

## 2023-02-28 MED ORDER — BENZONATATE 100 MG PO CAPS: 100.0000 mg | ORAL_CAPSULE | Freq: Three times a day (TID) | ORAL | 0 refills | Status: DC | PRN

## 2023-02-28 MED ORDER — CEPHALEXIN 500 MG PO CAPS: 500.0000 mg | ORAL_CAPSULE | Freq: Two times a day (BID) | ORAL | 0 refills | Status: AC

## 2023-02-28 NOTE — Discharge Instructions (Signed)
You have a viral upper respiratory infection.  Symptoms should improve over the next week to 10 days.  If you develop chest pain or shortness of breath, go to the emergency room.  Some things that can make you feel better are: - Increased rest - Increasing fluid with water/sugar free electrolytes - Acetaminophen and ibuprofen as needed for fever/pain - Salt water gargling, chloraseptic spray and throat lozenges - OTC guaifenesin (Mucinex) 600 mg twice daily - Saline sinus flushes or a neti pot - Humidifying the air -Tessalon Perles every 8 hours as needed for dry cough   The urine sample today is unreliable due to Azo use.  We are sending for urine culture.  Lets go ahead and treat based on your symptoms with Keflex twice daily for 5 days.  Will contact you later this week if we need to change the antibiotic.

## 2023-02-28 NOTE — ED Triage Notes (Signed)
Pt reports cough states she feels like she's not getting any air in, highest it has run at home is 92% states she has been using her husbands O2 and has only gotten as high as 94. Daughter states she has her on intermittent puffs of air from O2 and not continuous.

## 2023-02-28 NOTE — ED Provider Notes (Signed)
[facility]    CSN: [id number] Arrival date & time: 02/28/23  1808      History   Chief Complaint No chief complaint on file.   HPI Kristen Mack is a 77 y.o. female.   Patient presents today with daughter who helps provide history.  Patient reports 3-day history of bodyaches and chills, congested cough, shortness of breath when she coughs, wheezing when she coughs, stuffy nose that is now improved, sore throat, headache, decreased appetite, fatigue, and weakness.  No fevers, chest pain or tightness, runny nose, ear pain, abdominal pain, nausea, vomiting, or new diarrhea.  Has used albuterol nebulizer which has helped with symptoms temporarily.  Also concerned she may have a UTI, had some burning with urination a few days ago that has now resolved.  Denies new urinary incontinence or hematuria, but does endorse some urinary frequency and foul urinary odor.  She took Azo for urinary symptoms earlier today and it did seem to help.    Past Medical History:  Diagnosis Date   ADHD (attention deficit hyperactivity disorder) 2007   Adrenal adenoma, left    followed by dr nida   Arthritis    Asthma    followed by pcp   CFS (chronic fatigue syndrome)    CKD (chronic kidney disease), stage III [facility])    nephrologist---  dr sanford   Depression    Edema of both lower extremities    Fatty liver disease, nonalcoholic    Fibromyalgia    GERD (gastroesophageal reflux disease)    Hiatal hernia    History of adenomatous polyp of colon    History of cardiac murmur    until age 83- pt has had echocardiograms   History of TIA (transient ischemic attack)    per CT scan    Hyperlipidemia    Hypertension    Hypothyroidism    endocrinologist-- dr g. nida   IBS (irritable bowel syndrome)    Meniere disease    OSA on CPAP    per pt uses nightly   Osteopenia    Pancreas cyst    followed by pcp   PMB (postmenopausal bleeding)    PVC's (premature ventricular  contractions)    cardiologist--- dr Flora Lipps   Type 2 diabetes mellitus treated with insulin [facility])    endocrinologist-- dr g. nida;    (08-14-2021  per pt checks blood sugar QID ,  fasting average --- 73-150)   Urinary incontinence    Wears glasses    Wears partial dentures    upper    Patient Active Problem List   Diagnosis Date Noted   Moderate episode of recurrent major depressive disorder (HCC) 04/26/2022   Stage 3b chronic kidney disease (HCC) 04/26/2022   DOE (dyspnea on exertion) 01/29/2022   Symptomatic bradycardia 12/11/2021   Hypokalemia 12/11/2021   Type 2 DM with CKD stage 3 and hypertension (HCC) 08/10/2021   Trochanteric bursitis 06/05/2021   History of cystocele 08/04/2020   Urinary incontinence 08/04/2020   Bruising tendency 06/30/2020   Thickened endometrium 03/06/2020   Non-alcoholic fatty liver disease 03/06/2020   Chronic fatigue syndrome 03/04/2020   DDD (degenerative disc disease), cervical 03/04/2020   History of multiple allergies 03/04/2020   Mnire's disease 03/04/2020   Depressive disorder 03/04/2020   Hypertensive disorder 03/04/2020   Diabetes mellitus (HCC) 03/04/2020   Asthma 03/04/2020   Mixed hyperlipidemia 02/13/2020   Diabetes mellitus without complication (HCC) 10/31/2018   OSA on CPAP 07/11/2017   Pancreatic pseudocyst  05/10/2017   Adrenal adenoma 05/10/2017   Senile osteoporosis 11/21/2016   Fibromyalgia 08/10/2016   Female stress incontinence 11/05/2015   NASH (nonalcoholic steatohepatitis) 04/24/2015   Orthostatic hypotension 09/19/2013   Elevated transaminase level 09/19/2013   HTN (hypertension), benign 09/03/2013   Depression 08/14/2013   Fatty liver 08/14/2013   Other specified hypothyroidism 10/23/2012   Osteopenia 07/08/2012   Morbid obesity (HCC) 07/08/2012   Urinary bladder incontinence 04/30/2012   Family history of rectal cancer 10/04/2011   GERD (gastroesophageal reflux disease) 10/04/2011   IBS (irritable bowel  syndrome) 10/04/2011    Past Surgical History:  Procedure Laterality Date   CERVICAL POLYPECTOMY N/A 08/17/2021   Procedure: BIOPSY OF ENDOMETRIUM AND OR POLYPECTOMY;  Surgeon: Essie Hart, MD;  Location: [facility];  Service: Gynecology;  Laterality: N/A;   CESAREAN SECTION  1977  &  1979   CHOLECYSTECTOMY  1995   and  REPAIR UMBILICAL HERNIA   COLONOSCOPY N/A 04/19/2013   Procedure: COLONOSCOPY;  Surgeon: Malissa Hippo, MD;  Location: [facility];  Service: Endoscopy;  Laterality: N/A;     DILITATION & CURRETTAGE/HYSTROSCOPY WITH NOVASURE ABLATION N/A 08/17/2021   Procedure: DILATATION & CURETTAGE/HYSTEROSCOPY;  Surgeon: Essie Hart, MD;  Location: [facility];  Service: Gynecology;  Laterality: N/A;   ESOPHAGOGASTRODUODENOSCOPY  02/08/2003   HERNIA REPAIR  1995   HYSTEROSCOPY WITH D & C  11/14/2002   endometrial polypectomy   HYSTEROSCOPY WITH D & C N/A 04/05/2014   Procedure: DILATATION AND CURETTAGE /HYSTEROSCOPY, ENDOMETRIAL POLYPECTOMY;  Surgeon: Essie Hart, MD;  Location:  SUR[facility]ERY [facility];  Service: Gynecology;  Laterality: N/A;   LIVER BIOPSY  01/29/2004   benign   TUBAL LIGATION  1979    OB History     Gravida  5   Para      Term      Preterm      AB  3   Living  2      SAB  3   IAB      Ectopic      Multiple      Live Births  2            Home Medications    Prior to Admission medications   Medication Sig Start Date End Date Taking? Authorizing Provider  benzonatate (TESSALON) 100 MG capsule Take 1 capsule (100 mg total) by mouth 3 (three) times daily as needed for cough. Do not take with alcohol or while driving or operating heavy machinery.  May cause drowsiness. 02/28/23  Yes Valentino Nose, NP  cephALEXin (KEFLEX) 500 MG capsule Take 1 capsule (500 mg total) by mouth 2 (two) times daily for 5 days. 02/28/23 03/05/23 Yes Valentino Nose, NP  albuterol (PROVENTIL) (2.5 MG/3ML) 0.083% nebulizer  solution Take 3 mLs (2.5 mg total) by nebulization every 6 (six) hours as needed for wheezing or shortness of breath. 01/28/22   Nyoka Cowden, MD  albuterol (VENTOLIN HFA) 108 (90 Base) MCG/ACT inhaler Inhale 2 puffs into the lungs every 4 (four) hours as needed. INHALE 2 PUFFS INTO LUNGS EVERY 6 HOURS AS NEEDED FOR SHORTNESS OF BREATH 04/02/22   Babs Sciara, MD  Azelastine HCl 137 MCG/SPRAY SOLN USE 2 SPRAY(S) IN EACH NOSTRIL TWICE DAILY 08/27/20   Babs Sciara, MD  B Complex Vitamins (B COMPLEX 100 PO) Take 1 tablet by mouth daily.    [provider]  blood glucose meter kit and supplies Dispense  based on patient and insurance preference. Use to test sugars BID  (FOR ICD-10 E10.9, E11.9). 07/17/21   Babs Sciara, MD  buPROPion (WELLBUTRIN XL) 300 MG 24 hr tablet Take 1 tablet (300 mg total) by mouth daily. 02/03/23   Babs Sciara, MD  Chromium Picolinate 1000 MCG TABS Take 1 tablet by mouth daily.    [provider]  Coenzyme Q10 (COQ10) 200 MG CAPS Take 200 mg by mouth daily.    [provider]  Continuous Blood Gluc Receiver (FREESTYLE LIBRE 2 READER) DEVI As directed 08/10/21   Roma Kayser, MD  Continuous Blood Gluc Sensor (FREESTYLE LIBRE 2 SENSOR) MISC 1 Piece by Does not apply route every 14 (fourteen) days. 08/10/21   Roma Kayser, MD  Cyanocobalamin (B-12) 1000 MCG SUBL Place 1,000 mcg under the tongue daily.    [provider]  denosumab (PROLIA) 60 MG/ML SOSY injection INJECT 60 MG SUBCUTANEOUSLY ONCE FOR 1 DOSE 12/07/22   Roma Kayser, MD  DIGESTIVE ENZYMES PO Take 1 tablet by mouth 3 (three) times daily.     [provider]  estradiol (ESTRACE VAGINAL) 0.1 MG/GM vaginal cream 1 applicator intravaginal Sunday and thursday 01/26/23   Babs Sciara, MD  gabapentin (NEURONTIN) 100 MG capsule Take 1 capsule (100 mg total) by mouth 2 (two) times daily. 02/03/23   Babs Sciara, MD  glucose blood test strip Use to  check blood glucose four times daily as instructed 09/03/21   Roma Kayser, MD  insulin degludec (TRESIBA FLEXTOUCH) 100 UNIT/ML FlexTouch Pen Inject 46 Units into the skin daily. 01/25/23   Roma Kayser, MD  Insulin Pen Needle (RELION PEN NEEDLES) 31G X 6 MM MISC USE 1 PEN NEEDLE TO INJECT INSULIN ONCE DAILY 01/25/23   Babs Sciara, MD  levothyroxine (SYNTHROID) 125 MCG tablet Take 1 tablet (125 mcg total) by mouth every evening. 02/03/23   Babs Sciara, MD  Lysine HCl 500 MG TABS Take 500 mg by mouth daily.    [provider]  Magnesium 250 MG TABS Take 250 mg by mouth 2 (two) times daily.    [provider]  meclizine (ANTIVERT) 25 MG tablet Take 25 mg by mouth 3 (three) times daily as needed for dizziness.    [provider]  montelukast (SINGULAIR) 10 MG tablet Take 1 tablet (10 mg total) by mouth at bedtime. 02/03/23   Babs Sciara, MD  OVER THE COUNTER MEDICATION Take 1 Scoop by mouth 3 (three) times daily with meals. Fennel seeds    [provider]  OVER THE COUNTER MEDICATION Choline and inisotol    [provider]  rosuvastatin (CRESTOR) 5 MG tablet TAKE 1 TABLET BY MOUTH ON MONDAY, WEDNESDAY,  FRIDAY AND SATURDAY 02/03/23   Babs Sciara, MD  spironolactone (ALDACTONE) 25 MG tablet Take 0.5 tablets (12.5 mg total) by mouth daily. 02/03/23   Babs Sciara, MD  TART CHERRY PO Take 1 tablet by mouth 3 (three) times daily.    [provider]  tiZANidine (ZANAFLEX) 2 MG tablet TAKE 1 TABLET BY MOUTH AT BEDTIME AS NEEDED, MAY CAUSE DROWSINESS 02/23/23   Babs Sciara, MD  Vitamin A 3 MG (10000 UT) TABS Take 10,000 Units by mouth daily.    [provider]  vitamin E 180 MG (400 UNITS) capsule Take 800 Units by mouth daily.     [provider]    Family History Family History  Problem Relation Age of Onset   Rectal cancer Brother 28   Diabetes Brother    Ovarian cancer Mother 45    Hypertension Mother    Diabetes Mother    Cancer Mother    Depression Mother    Early death Mother    Obesity Mother    Hypertension Father    Diabetes Father    Depression Father    Hyperlipidemia Father    Stroke Father    Breast cancer Maternal Aunt    Breast cancer Paternal Aunt        four Paternal aunts   Heart disease Paternal Grandfather    Hyperlipidemia Paternal Grandfather    Hypertension Paternal Grandfather    Stroke Paternal Grandfather    Liver disease Sister        x 2; fatty liver   Hearing loss Maternal Grandfather    Hypertension Maternal Grandfather    Heart disease Maternal Grandmother    Hypertension Maternal Grandmother    Obesity Maternal Grandmother    Obesity Paternal Grandmother    ADD / ADHD Daughter    Depression Daughter    ADD / ADHD Daughter    Depression Daughter    Alcohol abuse Maternal Uncle    Depression Maternal Uncle    Drug abuse Maternal Uncle    Early death Maternal Uncle    Alcohol abuse Paternal Uncle    Depression Paternal Uncle    Drug abuse Paternal Uncle    Anxiety disorder Sister    Depression Sister    Hyperlipidemia Sister    Miscarriages / Stillbirths Sister    Cancer Brother    Depression Brother    Early death Brother    Cancer Maternal Aunt    Cancer Maternal Aunt    Cancer Paternal Aunt    Cancer Paternal Aunt    Cancer Paternal Aunt    Cancer Paternal Aunt    Drug abuse Brother    Heart disease Maternal Uncle    Hyperlipidemia Maternal Uncle    Hypertension Maternal Uncle    Hyperlipidemia Sister    Hyperlipidemia Brother    Hyperlipidemia Brother    Hypertension Maternal Uncle    Pancreatic cancer Neg Hx    Esophageal cancer Neg Hx     Social History Social History   Tobacco Use   Smoking status: Former    Current packs/day: 0.00    Average packs/day: 1.5 packs/day for 3.0 years (4.5 ttl pk-yrs)    Types: Cigarettes    Start date: 10/03/1965    Quit date: 10/03/1968    Years since quitting:  54.4    Passive exposure: Past   Smokeless tobacco: Never  Vaping Use   Vaping status: Never Used  Substance Use Topics   Alcohol use: No   Drug use: No     Allergies   Latex, Ambien [zolpidem tartrate], Cefzil [cefprozil], Citalopram, Codeine, Demerol [meperidine], Formaldehyde, Sulfa antibiotics, Zolpidem, and Celexa [citalopram hydrobromide]   Review of Systems Review of Systems Per HPI  Physical Exam Triage Vital Signs ED Triage Vitals  Encounter Vitals Group     BP 02/28/23 1920 137/89     Systolic BP Percentile --      Diastolic BP Percentile --      Pulse Rate 02/28/23 1920 87     Resp 02/28/23 1920 15     Temp 02/28/23 1920 98.7 F (37.1 C)     Temp Source 02/28/23 1920 Oral     SpO2 02/28/23 1920 94 %  Weight --      Height --      Head Circumference --      Peak Flow --      Pain Score 02/28/23 1922 4     Pain Loc --      Pain Education --      Exclude from Growth Chart --    No data found.  Updated Vital Signs BP 137/89 (BP Location: Right Arm)   Pulse 87   Temp 98.7 F (37.1 C) (Oral)   Resp 15   SpO2 94%   Visual Acuity Right Eye Distance:   Left Eye Distance:   Bilateral Distance:    Right Eye Near:   Left Eye Near:    Bilateral Near:     Physical Exam Vitals and nursing note reviewed.  Constitutional:      General: She is not in acute distress.    Appearance: Normal appearance. She is not ill-appearing or toxic-appearing.  HENT:     Head: Normocephalic and atraumatic.     Right Ear: Tympanic membrane, ear canal and external ear normal.     Left Ear: Tympanic membrane, ear canal and external ear normal.     Nose: Rhinorrhea present. No congestion.     Mouth/Throat:     Mouth: Mucous membranes are moist.     Pharynx: Oropharynx is clear. Posterior oropharyngeal erythema present. No oropharyngeal exudate.  Eyes:     General: No scleral icterus.    Extraocular Movements: Extraocular movements intact.  Cardiovascular:     Rate  and Rhythm: Normal rate and regular rhythm.  Pulmonary:     Effort: Pulmonary effort is normal. No respiratory distress.     Breath sounds: Normal breath sounds. No wheezing, rhonchi or rales.  Abdominal:     General: Abdomen is flat. Bowel sounds are normal. There is no distension.     Palpations: Abdomen is soft.  Musculoskeletal:     Cervical back: Normal range of motion and neck supple.  Lymphadenopathy:     Cervical: No cervical adenopathy.  Skin:    General: Skin is warm and dry.     Coloration: Skin is not jaundiced or pale.     Findings: No erythema or rash.  Neurological:     Mental Status: She is alert and oriented to person, place, and time.  Psychiatric:        Behavior: Behavior is cooperative.      UC Treatments / Results  Labs (all labs ordered are listed, but only abnormal results are displayed) Labs Reviewed  POCT URINALYSIS DIP (MANUAL ENTRY) - Abnormal; Notable for the following components:      Result Value   Clarity, UA cloudy (*)    Blood, UA trace-intact (*)    Protein Ur, POC =30 (*)    Nitrite, UA Positive (*)    Leukocytes, UA Large (3+) (*)    All other components within normal limits  URINE CULTURE    EKG   Radiology No results found.  Procedures Procedures (including critical care time)  Medications Ordered in UC Medications - No data to display  Initial Impression / Assessment and Plan / UC Course  I have reviewed the triage vital signs and the nursing notes.  Pertinent labs & imaging results that were available during my care of the patient were reviewed by me and considered in my medical decision making (see chart for details).   Patient is well-appearing, normotensive, afebrile, not tachycardic, not tachypneic, oxygenating well  on room air.    1. Dysuria Urinalysis today unreliable secondary to Azo use Patient does have history of UTI, last years ago through chart review Will treat with Keflex 500 mg twice daily for 5 days;  patient has had side effect of diarrhea with other cephalosporins but has not taken Keflex Urine culture pending Strict ER precautions discussed  2. Viral URI with cough Suspect viral etiology Vitals and exam are reassuring Viral testing deferred by patient Supportive care discussed, start cough suppressant medication Continue albuterol inhaler as needed Strict ER and return precautions discussed  The patient was given the opportunity to ask questions.  All questions answered to their satisfaction.  The patient is in agreement to this plan.    Final Clinical Impressions(s) / UC Diagnoses   Final diagnoses:  Dysuria  Viral URI with cough     Discharge Instructions      You have a viral upper respiratory infection.  Symptoms should improve over the next week to 10 days.  If you develop chest pain or shortness of breath, go to the emergency room.  Some things that can make you feel better are: - Increased rest - Increasing fluid with water/sugar free electrolytes - Acetaminophen and ibuprofen as needed for fever/pain - Salt water gargling, chloraseptic spray and throat lozenges - OTC guaifenesin (Mucinex) 600 mg twice daily - Saline sinus flushes or a neti pot - Humidifying the air -Tessalon Perles every 8 hours as needed for dry cough   The urine sample today is unreliable due to Azo use.  We are sending for urine culture.  Lets go ahead and treat based on your symptoms with Keflex twice daily for 5 days.  Will contact you later this week if we need to change the antibiotic.     ED Prescriptions     Medication Sig Dispense Auth. Provider   benzonatate (TESSALON) 100 MG capsule Take 1 capsule (100 mg total) by mouth 3 (three) times daily as needed for cough. Do not take with alcohol or while driving or operating heavy machinery.  May cause drowsiness. 21 capsule Cathlean Marseilles A, NP   cephALEXin (KEFLEX) 500 MG capsule Take 1 capsule (500 mg total) by mouth 2 (two) times  daily for 5 days. 10 capsule Valentino Nose, NP      PDMP not reviewed this encounter.   Valentino Nose, NP 03/01/23 [phone]

## 2023-03-02 LAB — URINE CULTURE: Culture: >=100000 — AB

## 2023-03-10 ENCOUNTER — Other Ambulatory Visit: Payer: Self-pay | Admitting: Internal Medicine

## 2023-03-11 ENCOUNTER — Encounter: Payer: PPO | Admitting: Nurse Practitioner

## 2023-03-13 ENCOUNTER — Ambulatory Visit
Admission: EM | Admit: 2023-03-13 | Discharge: 2023-03-13 | Disposition: A | Discharge status: Home / Self Care | Payer: PPO | Attending: Family Medicine | Admitting: Family Medicine

## 2023-03-13 ENCOUNTER — Other Ambulatory Visit: Payer: Self-pay | Admitting: Family Medicine

## 2023-03-13 ENCOUNTER — Ambulatory Visit: Payer: PPO

## 2023-03-13 ENCOUNTER — Encounter: Payer: Self-pay | Admitting: Emergency Medicine

## 2023-03-13 DIAGNOSIS — R059 Cough, unspecified: Secondary | ICD-10-CM | POA: Diagnosis not present

## 2023-03-13 DIAGNOSIS — R0789 Other chest pain: Secondary | ICD-10-CM | POA: Diagnosis not present

## 2023-03-13 DIAGNOSIS — I517 Cardiomegaly: Secondary | ICD-10-CM | POA: Diagnosis not present

## 2023-03-13 DIAGNOSIS — J4521 Mild intermittent asthma with (acute) exacerbation: Secondary | ICD-10-CM

## 2023-03-13 DIAGNOSIS — R062 Wheezing: Secondary | ICD-10-CM | POA: Diagnosis not present

## 2023-03-13 MED ORDER — ALBUTEROL SULFATE HFA 108 (90 BASE) MCG/ACT IN AERS: 2.0000 | INHALATION_SPRAY | RESPIRATORY_TRACT | 0 refills | Status: DC | PRN

## 2023-03-13 MED ORDER — ALBUTEROL SULFATE (2.5 MG/3ML) 0.083% IN NEBU: 2.5000 mg | INHALATION_SOLUTION | RESPIRATORY_TRACT | 0 refills | Status: DC | PRN

## 2023-03-13 NOTE — ED Triage Notes (Signed)
Pt reports shortness of breath that started around noon today. States has hx of asthma and reactive airways. Has been using albuterol neb treatments and inhaler as needed.

## 2023-03-13 NOTE — Discharge Instructions (Addendum)
We will call if anything comes back abnormal on your chest x-ray.  I have refilled your albuterol nebulizer solution and inhaler today.  Follow-up for worsening symptoms.

## 2023-03-14 DIAGNOSIS — E1122 Type 2 diabetes mellitus with diabetic chronic kidney disease: Secondary | ICD-10-CM | POA: Diagnosis not present

## 2023-03-14 NOTE — Telephone Encounter (Signed)
Pt called in on 03/12/2023 to get a refill for her albuterol, Pls advise

## 2023-03-16 ENCOUNTER — Ambulatory Visit: Payer: Self-pay | Admitting: Family Medicine

## 2023-03-16 ENCOUNTER — Encounter (INDEPENDENT_AMBULATORY_CARE_PROVIDER_SITE_OTHER): Payer: Self-pay | Admitting: *Deleted

## 2023-03-16 NOTE — Telephone Encounter (Signed)
Refused ED disposition.  Chief Complaint: SOB Symptoms: difficulty breathing, wheezing Frequency: since thanksgiving Pertinent Negatives: Patient denies fever, denies CP, denies lung hx aside from reactive airway disease.   Disposition: [x] ED /[] Urgent Care (no appt availability in office) / [] Appointment(In office/virtual)/ []  [facility]/ [] Home Care/ [x] Refused Recommended Disposition /[] [facility]/ []  Follow-up with PCP  Additional Notes: Pt only able to speak a few words at a time without needing to stop and catch her breath. SPO2 95-97%, HR 60s-70s. Pt not able to sit normally, states that she is hunched to the side to breathe.  Advised to call 911 and go to the ED.   Pt refused multiple times, stating "I have been to urgent care and they tell me there is nothing wrong".  Triager advised pt that the ED has more equipment and can investigate the illness more, pt still declines going to the ED.  Triager advised that pt should do another Neb treatment as per the Rx she can complete another one.  RN also advised pt to go to ED or call us back should she change her mind.  Ultimately pt was only willing to be seen by PCP.  Refused any and all advice pertaining to going to the ED. Pt sched for tomorrow.   Copied from CRM [id number]. Topic: Clinical - Red Word Triage >> Mar 16, 2023  9:54 AM Hector Shade B wrote: [facility] that prompted transfer to Nurse Triage: Short of breath and wheezing Answer Assessment - Initial Assessment Questions 1. RESPIRATORY STATUS: "Describe your breathing?" (e.g., wheezing, shortness of breath, unable to speak, severe coughing)      Wheezing, cough 2. ONSET: "When did this breathing problem begin?"      Before thanksgiving 3. PATTERN "Does the difficult breathing come and go, or has it been constant since it started?"      intermittent 4. SEVERITY: "How bad is your breathing?" (e.g., mild, moderate, severe)    - MILD: No SOB at rest, mild SOB with  walking, speaks normally in sentences, can lie down, no retractions, pulse < 100.    - MODERATE: SOB at rest, SOB with minimal exertion and prefers to sit, cannot lie down flat, speaks in phrases, mild retractions, audible wheezing, pulse 100-120.    - SEVERE: Very SOB at rest, speaks in single words, struggling to breathe, sitting hunched forward, retractions, pulse > 120      Moderate to severe per triager, pt is unable to speak in full sentences and answer triager in a timely fashion d/t catching breath 5. RECURRENT SYMPTOM: "Have you had difficulty breathing before?" If Yes, ask: "When was the last time?" and "What happened that time?"      Yes, has hx of reactive airway disease 6. CARDIAC HISTORY: "Do you have any history of heart disease?" (e.g., heart attack, angina, bypass surgery, angioplasty)      Was told xr shows a little cardiomyopatchy 7. LUNG HISTORY: "Do you have any history of lung disease?"  (e.g., pulmonary embolus, asthma, emphysema)     denies 8. CAUSE: "What do you think is causing the breathing problem?"      Unsure, UC tells me nothing is wrong 9. OTHER SYMPTOMS: "Do you have any other symptoms? (e.g., dizziness, runny nose, cough, chest pain, fever)     Cough,  10. O2 SATURATION MONITOR:  "Do you use an oxygen saturation monitor (pulse oximeter) at home?" If Yes, ask: "What is your reading (oxygen level) today?" "What is  your usual oxygen saturation reading?" (e.g., 95%)       95-97  Protocols used: Breathing Difficulty-A-AH  Reason for Disposition  [1] MODERATE difficulty breathing (e.g., speaks in phrases, SOB even at rest, pulse 100-120) AND [2] NEW-onset or WORSE than normal  Answer Assessment - Initial Assessment Questions 1. RESPIRATORY STATUS: "Describe your breathing?" (e.g., wheezing, shortness of breath, unable to speak, severe coughing)      Wheezing, cough 2. ONSET: "When did this breathing problem begin?"      Before thanksgiving 3. PATTERN "Does the  difficult breathing come and go, or has it been constant since it started?"      intermittent 4. SEVERITY: "How bad is your breathing?" (e.g., mild, moderate, severe)    - MILD: No SOB at rest, mild SOB with walking, speaks normally in sentences, can lie down, no retractions, pulse < 100.    - MODERATE: SOB at rest, SOB with minimal exertion and prefers to sit, cannot lie down flat, speaks in phrases, mild retractions, audible wheezing, pulse 100-120.    - SEVERE: Very SOB at rest, speaks in single words, struggling to breathe, sitting hunched forward, retractions, pulse > 120      Moderate to severe per triager, pt is unable to speak in full sentences and answer triager in a timely fashion d/t catching breath 5. RECURRENT SYMPTOM: "Have you had difficulty breathing before?" If Yes, ask: "When was the last time?" and "What happened that time?"      Yes, has hx of reactive airway disease 6. CARDIAC HISTORY: "Do you have any history of heart disease?" (e.g., heart attack, angina, bypass surgery, angioplasty)      Was told xr shows a little cardiomyopatchy 7. LUNG HISTORY: "Do you have any history of lung disease?"  (e.g., pulmonary embolus, asthma, emphysema)     denies 8. CAUSE: "What do you think is causing the breathing problem?"      Unsure, UC tells me nothing is wrong 9. OTHER SYMPTOMS: "Do you have any other symptoms? (e.g., dizziness, runny nose, cough, chest pain, fever)     Cough,  10. O2 SATURATION MONITOR:  "Do you use an oxygen saturation monitor (pulse oximeter) at home?" If Yes, ask: "What is your reading (oxygen level) today?" "What is your usual oxygen saturation reading?" (e.g., 95%)       95-97  Protocols used: Breathing Difficulty-A-AH

## 2023-03-16 NOTE — Telephone Encounter (Signed)
We will see the patient tomorrow but if she gets worse later today or tonight she should go to the ER

## 2023-03-17 ENCOUNTER — Other Ambulatory Visit: Payer: Self-pay

## 2023-03-17 ENCOUNTER — Ambulatory Visit: Payer: PPO | Admitting: Family Medicine

## 2023-03-17 VITALS — BP 144/98 | HR 74 | Temp 98.1°F | Ht 59.0 in | Wt 232.0 lb

## 2023-03-17 DIAGNOSIS — R0789 Other chest pain: Secondary | ICD-10-CM

## 2023-03-17 DIAGNOSIS — N183 Chronic kidney disease, stage 3 unspecified: Secondary | ICD-10-CM

## 2023-03-17 DIAGNOSIS — R635 Abnormal weight gain: Secondary | ICD-10-CM | POA: Diagnosis not present

## 2023-03-17 DIAGNOSIS — N1832 Chronic kidney disease, stage 3b: Secondary | ICD-10-CM

## 2023-03-17 DIAGNOSIS — I129 Hypertensive chronic kidney disease with stage 1 through stage 4 chronic kidney disease, or unspecified chronic kidney disease: Secondary | ICD-10-CM | POA: Diagnosis not present

## 2023-03-17 DIAGNOSIS — E1122 Type 2 diabetes mellitus with diabetic chronic kidney disease: Secondary | ICD-10-CM | POA: Diagnosis not present

## 2023-03-17 DIAGNOSIS — I10 Essential (primary) hypertension: Secondary | ICD-10-CM | POA: Diagnosis not present

## 2023-03-17 DIAGNOSIS — R0609 Other forms of dyspnea: Secondary | ICD-10-CM | POA: Diagnosis not present

## 2023-03-17 DIAGNOSIS — R600 Localized edema: Secondary | ICD-10-CM

## 2023-03-17 MED ORDER — FUROSEMIDE 20 MG PO TABS: ORAL_TABLET | ORAL | 3 refills | Status: DC

## 2023-03-17 NOTE — ED Provider Notes (Signed)
[facility]    CSN: [id number] Arrival date & time: 03/13/23  1537      History   Chief Complaint Chief Complaint  Patient presents with   Shortness of Breath    HPI Kristen Mack is a 77 y.o. female.   Patient presenting today with several day history of progressively worsening chest tightness, wheezing, shortness of breath.  She denies fever, chills, congestion, sore throat, body aches, chest pain, abdominal pain, nausea vomiting or diarrhea.  History of asthma on albuterol as needed, states she has been doing her nebulizer treatments and they are no longer helping as much as she would like.  She has contraindications to any sort of steroid, including inhaled so does not take any maintenance regimen currently.  No known sick contacts recently.  Not try anything over-the-counter for symptoms.    Past Medical History:  Diagnosis Date   ADHD (attention deficit hyperactivity disorder) 2007   Adrenal adenoma, left    followed by dr nida   Arthritis    Asthma    followed by pcp   CFS (chronic fatigue syndrome)    CKD (chronic kidney disease), stage III [facility])    nephrologist---  dr sanford   Depression    Edema of both lower extremities    Fatty liver disease, nonalcoholic    Fibromyalgia    GERD (gastroesophageal reflux disease)    Hiatal hernia    History of adenomatous polyp of colon    History of cardiac murmur    until age 75- pt has had echocardiograms   History of TIA (transient ischemic attack)    per CT scan    Hyperlipidemia    Hypertension    Hypothyroidism    endocrinologist-- dr g. nida   IBS (irritable bowel syndrome)    Meniere disease    OSA on CPAP    per pt uses nightly   Osteopenia    Pancreas cyst    followed by pcp   PMB (postmenopausal bleeding)    PVC's (premature ventricular contractions)    cardiologist--- dr Flora Lipps   Type 2 diabetes mellitus treated with insulin [facility])    endocrinologist-- dr g. nida;    (08-14-2021   per pt checks blood sugar QID ,  fasting average --- 73-150)   Urinary incontinence    Wears glasses    Wears partial dentures    upper    Patient Active Problem List   Diagnosis Date Noted   Moderate episode of recurrent major depressive disorder (HCC) 04/26/2022   Stage 3b chronic kidney disease (HCC) 04/26/2022   DOE (dyspnea on exertion) 01/29/2022   Symptomatic bradycardia 12/11/2021   Hypokalemia 12/11/2021   Type 2 DM with CKD stage 3 and hypertension (HCC) 08/10/2021   Trochanteric bursitis 06/05/2021   History of cystocele 08/04/2020   Urinary incontinence 08/04/2020   Bruising tendency 06/30/2020   Thickened endometrium 03/06/2020   Non-alcoholic fatty liver disease 03/06/2020   Chronic fatigue syndrome 03/04/2020   DDD (degenerative disc disease), cervical 03/04/2020   History of multiple allergies 03/04/2020   Mnire's disease 03/04/2020   Depressive disorder 03/04/2020   Hypertensive disorder 03/04/2020   Diabetes mellitus (HCC) 03/04/2020   Asthma 03/04/2020   Mixed hyperlipidemia 02/13/2020   Diabetes mellitus without complication (HCC) 10/31/2018   OSA on CPAP 07/11/2017   Pancreatic pseudocyst 05/10/2017   Adrenal adenoma 05/10/2017   Senile osteoporosis 11/21/2016   Fibromyalgia 08/10/2016   Female stress incontinence 11/05/2015   NASH (nonalcoholic  steatohepatitis) 04/24/2015   Orthostatic hypotension 09/19/2013   Elevated transaminase level 09/19/2013   HTN (hypertension), benign 09/03/2013   Depression 08/14/2013   Fatty liver 08/14/2013   Other specified hypothyroidism 10/23/2012   Osteopenia 07/08/2012   Morbid obesity (HCC) 07/08/2012   Urinary bladder incontinence 04/30/2012   Family history of rectal cancer 10/04/2011   GERD (gastroesophageal reflux disease) 10/04/2011   IBS (irritable bowel syndrome) 10/04/2011    Past Surgical History:  Procedure Laterality Date   CERVICAL POLYPECTOMY N/A 08/17/2021   Procedure: BIOPSY OF ENDOMETRIUM  AND OR POLYPECTOMY;  Surgeon: Essie Hart, MD;  Location: [facility];  Service: Gynecology;  Laterality: N/A;   CESAREAN SECTION  1977  &  1979   CHOLECYSTECTOMY  1995   and  REPAIR UMBILICAL HERNIA   COLONOSCOPY N/A 04/19/2013   Procedure: COLONOSCOPY;  Surgeon: Malissa Hippo, MD;  [facility];  Service: Endoscopy;  Laterality: N/A;     DILITATION & CURRETTAGE/HYSTROSCOPY WITH NOVASURE ABLATION N/A 08/17/2021   Procedure: DILATATION & CURETTAGE/HYSTEROSCOPY;  Surgeon: Essie Hart, MD;  Location: [facility];  Service: Gynecology;  Laterality: N/A;   ESOPHAGOGASTRODUODENOSCOPY  02/08/2003   HERNIA REPAIR  1995   HYSTEROSCOPY WITH D & C  11/14/2002   endometrial polypectomy   HYSTEROSCOPY WITH D & C N/A 04/05/2014   Procedure: DILATATION AND CURETTAGE /HYSTEROSCOPY, ENDOMETRIAL POLYPECTOMY;  Surgeon: Essie Hart, MD;  Location:  SURGERY CENTER;  Service: Gynecology;  Laterality: N/A;   LIVER BIOPSY  01/29/2004   benign   TUBAL LIGATION  1979    OB History     Gravida  5   Para      Term      Preterm      AB  3   Living  2      SAB  3   IAB      Ectopic      Multiple      Live Births  2            Home Medications    Prior to Admission medications   Medication Sig Start Date End Date Taking? Authorizing Provider  albuterol (PROVENTIL) (2.5 MG/3ML) 0.083% nebulizer solution Take 3 mLs (2.5 mg total) by nebulization every 4 (four) hours as needed for wheezing or shortness of breath. 03/13/23  Yes Particia Nearing, PA-C  albuterol (PROVENTIL) (2.5 MG/3ML) 0.083% nebulizer solution Take 3 mLs (2.5 mg total) by nebulization every 6 (six) hours as needed for wheezing or shortness of breath. 01/28/22   Nyoka Cowden, MD  albuterol (VENTOLIN HFA) 108 (90 Base) MCG/ACT inhaler Inhale 2 puffs into the lungs every 4 (four) hours as needed. INHALE 2 PUFFS INTO LUNGS EVERY 6 HOURS AS NEEDED FOR SHORTNESS OF BREATH  03/13/23   Particia Nearing, PA-C  Azelastine HCl 137 MCG/SPRAY SOLN USE 2 SPRAY(S) IN EACH NOSTRIL TWICE DAILY 08/27/20   Babs Sciara, MD  B Complex Vitamins (B COMPLEX 100 PO) Take 1 tablet by mouth daily.    [provider]  benzonatate (TESSALON) 100 MG capsule Take 1 capsule (100 mg total) by mouth 3 (three) times daily as needed for cough. Do not take with alcohol or while driving or operating heavy machinery.  May cause drowsiness. 02/28/23   Valentino Nose, NP  blood glucose meter kit and supplies Dispense based on patient and insurance preference. Use to test sugars BID  (FOR ICD-10 E10.9, E11.9). 07/17/21  Babs Sciara, MD  buPROPion (WELLBUTRIN XL) 300 MG 24 hr tablet Take 1 tablet (300 mg total) by mouth daily. 02/03/23   Babs Sciara, MD  Chromium Picolinate 1000 MCG TABS Take 1 tablet by mouth daily.    [provider]  Coenzyme Q10 (COQ10) 200 MG CAPS Take 200 mg by mouth daily.    [provider]  Continuous Blood Gluc Receiver (FREESTYLE LIBRE 2 READER) DEVI As directed 08/10/21   Roma Kayser, MD  Continuous Blood Gluc Sensor (FREESTYLE LIBRE 2 SENSOR) MISC 1 Piece by Does not apply route every 14 (fourteen) days. 08/10/21   Roma Kayser, MD  Cyanocobalamin (B-12) 1000 MCG SUBL Place 1,000 mcg under the tongue daily.    [provider]  denosumab (PROLIA) 60 MG/ML SOSY injection INJECT 60 MG SUBCUTANEOUSLY ONCE FOR 1 DOSE 12/07/22   Roma Kayser, MD  DIGESTIVE ENZYMES PO Take 1 tablet by mouth 3 (three) times daily.     [provider]  estradiol (ESTRACE VAGINAL) 0.1 MG/GM vaginal cream 1 applicator intravaginal Sunday and thursday 01/26/23   Babs Sciara, MD  gabapentin (NEURONTIN) 100 MG capsule Take 1 capsule (100 mg total) by mouth 2 (two) times daily. 02/03/23   Babs Sciara, MD  glucose blood test strip Use to check blood glucose four times daily as instructed 09/03/21   Roma Kayser, MD  insulin degludec (TRESIBA FLEXTOUCH) 100 UNIT/ML FlexTouch Pen Inject 46 Units into the skin daily. 01/25/23   Roma Kayser, MD  Insulin Pen Needle (RELION PEN NEEDLES) 31G X 6 MM MISC USE 1 PEN NEEDLE TO INJECT INSULIN ONCE DAILY 01/25/23   Babs Sciara, MD  levothyroxine (SYNTHROID) 125 MCG tablet Take 1 tablet (125 mcg total) by mouth every evening. 02/03/23   Babs Sciara, MD  Lysine HCl 500 MG TABS Take 500 mg by mouth daily.    [provider]  Magnesium 250 MG TABS Take 250 mg by mouth 2 (two) times daily.    [provider]  meclizine (ANTIVERT) 25 MG tablet Take 25 mg by mouth 3 (three) times daily as needed for dizziness.    [provider]  montelukast (SINGULAIR) 10 MG tablet Take 1 tablet (10 mg total) by mouth at bedtime. 02/03/23   Babs Sciara, MD  OVER THE COUNTER MEDICATION Take 1 Scoop by mouth 3 (three) times daily with meals. Fennel seeds    [provider]  OVER THE COUNTER MEDICATION Choline and inisotol    [provider]  rosuvastatin (CRESTOR) 5 MG tablet TAKE 1 TABLET BY MOUTH ON MONDAY, WEDNESDAY,  FRIDAY AND SATURDAY 02/03/23   Babs Sciara, MD  spironolactone (ALDACTONE) 25 MG tablet Take 0.5 tablets (12.5 mg total) by mouth daily. 02/03/23   Babs Sciara, MD  TART CHERRY PO Take 1 tablet by mouth 3 (three) times daily.    [provider]  tiZANidine (ZANAFLEX) 2 MG tablet TAKE 1 TABLET BY MOUTH AT BEDTIME AS NEEDED, MAY CAUSE DROWSINESS 02/23/23   Babs Sciara, MD  Vitamin A 3 MG (10000 UT) TABS Take 10,000 Units by mouth daily.    [provider]  vitamin E 180 MG (400 UNITS) capsule Take 800 Units by mouth daily.     [provider]    Family History Family History  Problem Relation Age of Onset   Rectal cancer Brother 69   Diabetes Brother    Ovarian  cancer Mother 68   Hypertension Mother    Diabetes Mother    Cancer Mother    Depression  Mother    Early death Mother    Obesity Mother    Hypertension Father    Diabetes Father    Depression Father    Hyperlipidemia Father    Stroke Father    Breast cancer Maternal Aunt    Breast cancer Paternal Aunt        four Paternal aunts   Heart disease Paternal Grandfather    Hyperlipidemia Paternal Grandfather    Hypertension Paternal Grandfather    Stroke Paternal Grandfather    Liver disease Sister        x 2; fatty liver   Hearing loss Maternal Grandfather    Hypertension Maternal Grandfather    Heart disease Maternal Grandmother    Hypertension Maternal Grandmother    Obesity Maternal Grandmother    Obesity Paternal Grandmother    ADD / ADHD Daughter    Depression Daughter    ADD / ADHD Daughter    Depression Daughter    Alcohol abuse Maternal Uncle    Depression Maternal Uncle    Drug abuse Maternal Uncle    Early death Maternal Uncle    Alcohol abuse Paternal Uncle    Depression Paternal Uncle    Drug abuse Paternal Uncle    Anxiety disorder Sister    Depression Sister    Hyperlipidemia Sister    Miscarriages / Stillbirths Sister    Cancer Brother    Depression Brother    Early death Brother    Cancer Maternal Aunt    Cancer Maternal Aunt    Cancer Paternal Aunt    Cancer Paternal Aunt    Cancer Paternal Aunt    Cancer Paternal Aunt    Drug abuse Brother    Heart disease Maternal Uncle    Hyperlipidemia Maternal Uncle    Hypertension Maternal Uncle    Hyperlipidemia Sister    Hyperlipidemia Brother    Hyperlipidemia Brother    Hypertension Maternal Uncle    Pancreatic cancer Neg Hx    Esophageal cancer Neg Hx     Social History Social History   Tobacco Use   Smoking status: Former    Current packs/day: 0.00    Average packs/day: 1.5 packs/day for 3.0 years (4.5 ttl pk-yrs)    Types: Cigarettes    Start date: 10/03/1965    Quit date: 10/03/1968    Years since quitting: 54.4    Passive exposure: Past   Smokeless tobacco: Never  Vaping Use    Vaping status: Never Used  Substance Use Topics   Alcohol use: No   Drug use: No     Allergies   Latex, Ambien [zolpidem tartrate], Cefzil [cefprozil], Citalopram, Codeine, Demerol [meperidine], Formaldehyde, Sulfa antibiotics, Zolpidem, and Celexa [citalopram hydrobromide]   Review of Systems Review of Systems Per HPI  Physical Exam Triage Vital Signs ED Triage Vitals  Encounter Vitals Group     BP 03/13/23 1547 (!) 140/72     Systolic BP Percentile --      Diastolic BP Percentile --      Pulse Rate 03/13/23 1547 72     Resp 03/13/23 1547 16     Temp 03/13/23 1547 97.6 F (36.4 C)     Temp Source 03/13/23 1547 Oral     SpO2 03/13/23 1547 96 %     Weight --      Height --  Head Circumference --      Peak Flow --      Pain Score 03/13/23 1544 0     Pain Loc --      Pain Education --      Exclude from Growth Chart --    No data found.  Updated Vital Signs BP (!) 140/72 (BP Location: Right Arm)   Pulse 72   Temp 97.6 F (36.4 C) (Oral)   Resp 16   SpO2 96%   Visual Acuity Right Eye Distance:   Left Eye Distance:   Bilateral Distance:    Right Eye Near:   Left Eye Near:    Bilateral Near:     Physical Exam Vitals and nursing note reviewed.  Constitutional:      Appearance: Normal appearance.  HENT:     Head: Atraumatic.     Right Ear: Tympanic membrane and external ear normal.     Left Ear: Tympanic membrane and external ear normal.     Nose: Nose normal. No congestion.     Mouth/Throat:     Mouth: Mucous membranes are moist.     Pharynx: No posterior oropharyngeal erythema.  Eyes:     Extraocular Movements: Extraocular movements intact.     Conjunctiva/sclera: Conjunctivae normal.  Cardiovascular:     Rate and Rhythm: Normal rate and regular rhythm.     Heart sounds: Normal heart sounds.  Pulmonary:     Effort: Pulmonary effort is normal.     Breath sounds: Wheezing present. No rales.  Musculoskeletal:        General: Normal range of  motion.     Cervical back: Normal range of motion and neck supple.  Skin:    General: Skin is warm and dry.  Neurological:     Mental Status: She is alert and oriented to person, place, and time.  Psychiatric:        Mood and Affect: Mood normal.        Thought Content: Thought content normal.      UC Treatments / Results  Labs (all labs ordered are listed, but only abnormal results are displayed) Labs Reviewed - No data to display  EKG   Radiology No results found.  Procedures Procedures (including critical care time)  Medications Ordered in UC Medications - No data to display  Initial Impression / Assessment and Plan / UC Course  I have reviewed the triage vital signs and the nursing notes.  Pertinent labs & imaging results that were available during my care of the patient were reviewed by me and considered in my medical decision making (see chart for details).     Minimally hypertensive in triage, otherwise vital signs reassuring.  She is well-appearing and in no acute distress, speaking in full sentences but does have diffuse wheezes.  She states she called for a refill of her albuterol but was unable to get 1 prior to the weekend so wants a refill today.  Chest x-ray today negative for pneumonia.  Offered to try DuoNeb solution instead of plain albuterol to see if this helps more with her symptoms but she is requesting only a refill on her albuterol and to follow-up with pulmonology next week.  Refill sent, return sooner for worsening symptoms.  Final Clinical Impressions(s) / UC Diagnoses   Final diagnoses:  Mild intermittent asthma with acute exacerbation     Discharge Instructions      We will call if anything comes back abnormal on your chest x-ray.  I have refilled your albuterol nebulizer solution and inhaler today.  Follow-up for worsening symptoms.    ED Prescriptions     Medication Sig Dispense Auth. Provider   albuterol (VENTOLIN HFA) 108 (90  Base) MCG/ACT inhaler Inhale 2 puffs into the lungs every 4 (four) hours as needed. INHALE 2 PUFFS INTO LUNGS EVERY 6 HOURS AS NEEDED FOR SHORTNESS OF BREATH 18 g Particia Nearing, PA-C   albuterol (PROVENTIL) (2.5 MG/3ML) 0.083% nebulizer solution Take 3 mLs (2.5 mg total) by nebulization every 4 (four) hours as needed for wheezing or shortness of breath. 75 mL Particia Nearing, [place]      PDMP not reviewed this encounter.   Roosvelt Maser [place], [place] 03/17/23 [phone]

## 2023-03-17 NOTE — Progress Notes (Signed)
Subjective:    Patient ID: Kristen Mack, female    DOB: 17-Apr-1945, 77 y.o.   MRN: [id number]  Discussed the use of AI scribe software for clinical note transcription with the patient, who gave verbal consent to proceed.  History of Present Illness   The patient, with an unspecified medical history, presents with a chief complaint of extreme fatigue and weakness, which began prior to Thanksgiving. She reports feeling unwell, with a persistent sensation of breathlessness both at rest and during movement. She denies any fever or chills, but does report feeling hot without a corresponding increase in body temperature.  The patient's energy level is reported as persistently low, with only occasional short spells of increased energy. This represents a significant change from her state two months prior. She sleeps propped up, a long-standing habit.  Her appetite has been poor over the past few weeks, with a lack of hunger and altered taste perception, which she attributes to a previous COVID infection. Despite this, she has been forcing herself to eat and has been able to maintain adequate fluid intake.  The patient reports regular bowel movements, with occasional episodes of diarrhea approximately once a week. She has noticed significant swelling in her legs and an 11-pound weight gain over the past six weeks, which has left her feeling bloated. She denies any changes in her prescription medications, but notes that she has not been taking any supplements.  Upon examination, the patient reported feeling bloated and full, with difficulty eating more than a third to half of her usual portion sizes. She denies any chest pain, but reports occasional tightness in the chest, particularly when experiencing difficulty breathing. The patient uses a nebulizer, which seems to alleviate some of the discomfort.  The patient's symptoms have led to significant distress, and she expresses a desire for further  testing to determine the cause of her symptoms.         Review of Systems     Objective:    Physical Exam   VITALS: BP 144/106 MEASUREMENTS: Weight 11 lbs higher than 6 weeks ago CHEST: Lungs clear to auscultation CARDIOVASCULAR: Regular heart rate, murmur present ABDOMEN: Feels bloated, no pain on palpation EXTREMITIES: Significant edema           Assessment & Plan:  Assessment and Plan    Generalized Weakness and Fatigue Onset before Thanksgiving, persistent and significant change from baseline. No associated fever, chills, or night sweats. -Order comprehensive blood work to assess kidney and liver function, and cardiac enzymes.  Shortness of Breath Both at rest and with exertion. No associated chest pain, but reports feeling of tightness. Nebulizer provides some relief. -Perform EKG to assess cardiac function. -Consider echocardiogram to assess for changes in mitral valve regurgitation.  Decreased Appetite and Altered Taste Ongoing since COVID infection, worsened recently. Difficulty consuming solid foods, but able to maintain fluid intake. -Encourage continued fluid intake and consumption of palatable foods as tolerated.  Abdominal Bloating and Weight Gain Reports feeling bloated and has gained 11 pounds in the past six weeks. No associated abdominal pain. -Order diuretic (Lasix 20mg ) to be taken as needed, with initial use for a couple of days and then twice weekly. -Consider imaging to assess for fluid accumulation in the abdomen.  Lower Extremity Edema Significant swelling reported in the legs. -Continue monitoring and management with diuretic as needed.  Follow-up Plan to reassess patient's condition after completion of ordered tests and interventions.      1. DOE (dyspnea  on exertion) (Primary) EKG does not show any particular findings but given her shortness of breath BNP and then more than likely will do echo as well to rule out the possibility of  advancement of her vascular disease - EKG 12-Lead - Brain natriuretic peptide  2. Chest tightness I doubt that this is coronary artery disease by EKG overall look good await BMP - Brain natriuretic peptide  3. Weight gain She does have some fluid in the ankles and about a 10 pound weight gain I recommend diuretic for the next 2 days then diuretic 2 days/week then we will follow-up within 4 weeks - Brain natriuretic peptide  4. Pedal edema Please see discussion above - Brain natriuretic peptide  5. HTN (hypertension), benign Check lab work continue current measures - Brain natriuretic peptide - Basic Metabolic Panel - Hepatic Function Panel  6. Type 2 DM with CKD stage 3 and hypertension (HCC) No need to check A1c at this point by February into March we will continue current measures - Basic Metabolic Panel - CBC with Differential - Hepatic Function Panel  7. Stage 3b chronic kidney disease (HCC) Continue current measures healthy diet - Basic Metabolic Panel - CBC with Differential - Hepatic Function Panel .   30 minutes spent with patient evaluating this issue doing the EKG interpreting the EKG then discussing with patient the game plan

## 2023-03-19 ENCOUNTER — Encounter: Payer: Self-pay | Admitting: Family Medicine

## 2023-03-19 LAB — HEPATIC FUNCTION PANEL
ALT: 21 [IU]/L (ref 0–32)
AST: 25 [IU]/L (ref 0–40)
Albumin: 3.9 g/dL (ref 3.8–4.8)
Alkaline Phosphatase: 69 [IU]/L (ref 44–121)
Bilirubin Total: 0.4 mg/dL (ref 0.0–1.2)
Bilirubin, Direct: 0.14 mg/dL (ref 0.00–0.40)
Total Protein: 7.4 g/dL (ref 6.0–8.5)

## 2023-03-19 LAB — BASIC METABOLIC PANEL
BUN/Creatinine Ratio: 11 — ABNORMAL LOW (ref 12–28)
BUN: 16 mg/dL (ref 8–27)
CO2: 19 mmol/L — ABNORMAL LOW (ref 20–29)
Calcium: 9.2 mg/dL (ref 8.7–10.3)
Chloride: 108 mmol/L — ABNORMAL HIGH (ref 96–106)
Creatinine, Ser: 1.44 mg/dL — ABNORMAL HIGH (ref 0.57–1.00)
Glucose: 81 mg/dL (ref 70–99)
Potassium: 4.3 mmol/L (ref 3.5–5.2)
Sodium: 141 mmol/L (ref 134–144)
eGFR: 37 mL/min/{1.73_m2} — ABNORMAL LOW (ref >59)

## 2023-03-19 LAB — CBC WITH DIFFERENTIAL/PLATELET
Basophils Absolute: 0 10*3/uL (ref 0.0–0.2)
Basos: 0 %
EOS (ABSOLUTE): 11 10*3/uL — ABNORMAL HIGH (ref 0.0–0.4)
Eos: 63 %
Hematocrit: 40.1 % (ref 34.0–46.6)
Hemoglobin: 12.7 g/dL (ref 11.1–15.9)
Immature Grans (Abs): 0 10*3/uL (ref 0.0–0.1)
Immature Granulocytes: 0 %
Lymphocytes Absolute: 1.5 10*3/uL (ref 0.7–3.1)
Lymphs: 9 %
MCH: 27.7 pg (ref 26.6–33.0)
MCHC: 31.7 g/dL (ref 31.5–35.7)
MCV: 88 fL (ref 79–97)
Monocytes Absolute: 0.7 10*3/uL (ref 0.1–0.9)
Monocytes: 4 %
Neutrophils Absolute: 4.2 10*3/uL (ref 1.4–7.0)
Neutrophils: 24 %
Platelets: 348 10*3/uL (ref 150–450)
RBC: 4.58 x10E6/uL (ref 3.77–5.28)
RDW: 14.3 % (ref 11.7–15.4)
WBC: 17.5 10*3/uL — ABNORMAL HIGH (ref 3.4–10.8)

## 2023-03-19 LAB — BRAIN NATRIURETIC PEPTIDE: BNP: 174.5 pg/mL — ABNORMAL HIGH (ref 0.0–100.0)

## 2023-03-22 ENCOUNTER — Other Ambulatory Visit: Payer: Self-pay | Admitting: *Deleted

## 2023-03-22 DIAGNOSIS — R0789 Other chest pain: Secondary | ICD-10-CM

## 2023-03-22 DIAGNOSIS — R0609 Other forms of dyspnea: Secondary | ICD-10-CM

## 2023-03-29 ENCOUNTER — Other Ambulatory Visit: Payer: Self-pay | Admitting: "Endocrinology

## 2023-03-29 DIAGNOSIS — N183 Chronic kidney disease, stage 3 unspecified: Secondary | ICD-10-CM

## 2023-04-06 ENCOUNTER — Ambulatory Visit: Payer: PPO | Admitting: Urology

## 2023-04-06 ENCOUNTER — Encounter: Payer: Self-pay | Admitting: Urology

## 2023-04-06 DIAGNOSIS — N390 Urinary tract infection, site not specified: Secondary | ICD-10-CM

## 2023-04-06 DIAGNOSIS — R350 Frequency of micturition: Secondary | ICD-10-CM | POA: Diagnosis not present

## 2023-04-06 DIAGNOSIS — Z87440 Personal history of urinary (tract) infections: Secondary | ICD-10-CM

## 2023-04-06 LAB — BLADDER SCAN AMB NON-IMAGING: Scan Result: 0

## 2023-04-06 MED ORDER — GEMTESA 75 MG PO TABS: 1.0000 | ORAL_TABLET | Freq: Every day | ORAL | Status: DC

## 2023-04-06 NOTE — Progress Notes (Signed)
 post void residual=0 ?

## 2023-04-06 NOTE — Progress Notes (Signed)
 04/06/2023 4:12 PM   Kristen Mack July 04, 1945 [id number]  Referring provider: Rayburn Pac, MD [address] [place],  [place] [address]  Frequent UTI   HPI: Ms Kristen Mack is a 78yo here for evaluation of frequent UTI. Over the past 2 years she has gotten 4 UTIs per year. Her last UTI was in December and was pan sensitive E Coli. She has urge incontinence and uses 4 depends during the day which are soaked and she uses 2-3 pads a night. She has tried timed voiding.  She had multiple UTIs as a child and she was told she had a small capacity bladder.    PMH: Past Medical History:  Diagnosis Date   ADHD (attention deficit hyperactivity disorder) 2007   Adrenal adenoma, left    followed by dr nida   Arthritis    Asthma    followed by pcp   CFS (chronic fatigue syndrome)    CKD (chronic kidney disease), stage III [facility])    nephrologist---  dr marlee   Depression    Edema of both lower extremities    Fatty liver disease, nonalcoholic    Fibromyalgia    GERD (gastroesophageal reflux disease)    Hiatal hernia    History of adenomatous polyp of colon    History of cardiac murmur    until age 64- pt has had echocardiograms   History of TIA (transient ischemic attack)    per CT scan    Hyperlipidemia    Hypertension    Hypothyroidism    endocrinologist-- dr g. nida   IBS (irritable bowel syndrome)    Meniere disease    OSA on CPAP    per pt uses nightly   Osteopenia    Pancreas cyst    followed by pcp   PMB (postmenopausal bleeding)    PVC's (premature ventricular contractions)    cardiologist--- dr barbaraann   Type 2 diabetes mellitus treated with insulin  [facility])    endocrinologist-- dr g. nida;    (08-14-2021  per pt checks blood sugar QID ,  fasting average --- 73-150)   Urinary incontinence    Wears glasses    Wears partial dentures    upper    Surgical History: Past Surgical History:  Procedure Laterality Date   CERVICAL POLYPECTOMY N/A 08/17/2021    Procedure: BIOPSY OF ENDOMETRIUM AND OR POLYPECTOMY;  Surgeon: Bettina Muskrat, MD;  Location: [facility];  Service: Gynecology;  Laterality: N/A;   CESAREAN SECTION  1977  &  1979   CHOLECYSTECTOMY  1995   and  REPAIR UMBILICAL HERNIA   COLONOSCOPY N/A 04/19/2013   Procedure: COLONOSCOPY;  Surgeon: Claudis RAYMOND Rivet, MD;  [facility];  Service: Endoscopy;  Laterality: N/A;     DILITATION & CURRETTAGE/HYSTROSCOPY WITH NOVASURE ABLATION N/A 08/17/2021   Procedure: DILATATION & CURETTAGE/HYSTEROSCOPY;  Surgeon: Bettina Muskrat, MD;  Location: [facility];  Service: Gynecology;  Laterality: N/A;   ESOPHAGOGASTRODUODENOSCOPY  02/08/2003   HERNIA REPAIR  1995   HYSTEROSCOPY WITH D & C  11/14/2002   endometrial polypectomy   HYSTEROSCOPY WITH D & C N/A 04/05/2014   Procedure: DILATATION AND CURETTAGE /HYSTEROSCOPY, ENDOMETRIAL POLYPECTOMY;  Surgeon: Muskrat Bettina, MD;  Location: [facility];  Service: Gynecology;  Laterality: N/A;   LIVER BIOPSY  01/29/2004   benign   TUBAL LIGATION  1979    Home Medications:  Allergies as of 04/06/2023       Reactions   Latex Other (  See Comments), Itching   skin gets raw   Ambien [zolpidem Tartrate] Other (See Comments)   Side effect lost a couple days; memory loss   Cefzil  [cefprozil ] Diarrhea   Citalopram     Other reaction(s): Not available   Codeine Itching, Nausea And Vomiting   Demerol  [meperidine ] Other (See Comments)   messes up senses   Formaldehyde    Sulfa Antibiotics Other (See Comments)   Severe abd cramp   Zolpidem    Other reaction(s): Not available   Celexa  [citalopram  Hydrobromide] Other (See Comments)   Mouth sores        Medication List        Accurate as of April 06, 2023  4:12 PM. If you have any questions, ask your nurse or doctor.          STOP taking these medications    benzonatate  100 MG capsule Commonly known as: TESSALON        TAKE these medications     albuterol  (2.5 MG/3ML) 0.083% nebulizer solution Commonly known as: PROVENTIL  Take 3 mLs (2.5 mg total) by nebulization every 6 (six) hours as needed for wheezing or shortness of breath. What changed: Another medication with the same name was removed. Continue taking this medication, and follow the directions you see here.   albuterol  108 (90 Base) MCG/ACT inhaler Commonly known as: VENTOLIN  HFA Inhale 2 puffs into the lungs every 4 (four) hours as needed. INHALE 2 PUFFS INTO LUNGS EVERY 6 HOURS AS NEEDED FOR SHORTNESS OF BREATH What changed: Another medication with the same name was removed. Continue taking this medication, and follow the directions you see here.   Azelastine  HCl 137 MCG/SPRAY Soln USE 2 SPRAY(S) IN EACH NOSTRIL TWICE DAILY   B COMPLEX 100 PO Take 1 tablet by mouth daily.   B-12 1000 MCG Subl Place 1,000 mcg under the tongue daily.   blood glucose meter kit and supplies Dispense based on patient and insurance preference. Use to test sugars BID  (FOR ICD-10 E10.9, E11.9).   buPROPion  300 MG 24 hr tablet Commonly known as: WELLBUTRIN  XL Take 1 tablet (300 mg total) by mouth daily.   Chromium Picolinate 1000 MCG Tabs Take 1 tablet by mouth daily.   CoQ10 200 MG Caps Take 200 mg by mouth daily.   DIGESTIVE ENZYMES PO Take 1 tablet by mouth 3 (three) times daily.   estradiol  0.1 MG/GM vaginal cream Commonly known as: ESTRACE  VAGINAL 1 applicator intravaginal Sunday and thursday   FreeStyle [place] 2 Reader Espiridion As directed   [employer] 2 Sensor Misc 1 Piece by Does not apply route every 14 (fourteen) days.   furosemide  20 MG tablet Commonly known as: LASIX  1 qam prn pedal edema   gabapentin  100 MG capsule Commonly known as: NEURONTIN  Take 1 capsule (100 mg total) by mouth 2 (two) times daily.   glucose blood test strip Use to check blood glucose four times daily as instructed   levothyroxine  125 MCG tablet Commonly known as: SYNTHROID  Take  1 tablet (125 mcg total) by mouth every evening.   Lysine HCl 500 MG Tabs Take 500 mg by mouth daily.   Magnesium  250 MG Tabs Take 250 mg by mouth 2 (two) times daily.   meclizine 25 MG tablet Commonly known as: ANTIVERT Take 25 mg by mouth 3 (three) times daily as needed for dizziness.   montelukast  10 MG tablet Commonly known as: SINGULAIR  Take 1 tablet (10 mg total) by mouth at bedtime.   OVER  THE COUNTER MEDICATION Take 1 Scoop by mouth 3 (three) times daily with meals. Fennel seeds   OVER THE COUNTER MEDICATION Choline and inisotol   Prolia  60 MG/ML Sosy injection Generic drug: denosumab  INJECT 60 MG SUBCUTANEOUSLY ONCE FOR 1 DOSE   ReliOn Pen Needles 31G X 6 MM Misc Generic drug: Insulin  Pen Needle USE 1 PEN NEEDLE TO INJECT INSULIN  ONCE DAILY   rosuvastatin  5 MG tablet Commonly known as: CRESTOR  TAKE 1 TABLET BY MOUTH ON MONDAY, WEDNESDAY,  FRIDAY AND SATURDAY   spironolactone  25 MG tablet Commonly known as: ALDACTONE  Take 0.5 tablets (12.5 mg total) by mouth daily.   TART CHERRY PO Take 1 tablet by mouth 3 (three) times daily.   tiZANidine  2 MG tablet Commonly known as: ZANAFLEX  TAKE 1 TABLET BY MOUTH AT BEDTIME AS NEEDED, MAY CAUSE DROWSINESS   Tresiba  FlexTouch 100 UNIT/ML FlexTouch Pen Generic drug: insulin  degludec INJECT 46 UNITS SUBCUTANEOUSLY ONCE DAILY   Vitamin A 3 MG (10000 UT) Tabs Take 10,000 Units by mouth daily.   vitamin E 180 MG (400 UNITS) capsule Take 800 Units by mouth daily.        Allergies:  Allergies  Allergen Reactions   Latex Other (See Comments) and Itching    skin gets raw   Ambien [Zolpidem Tartrate] Other (See Comments)    Side effect lost a couple days; memory loss   Cefzil  [Cefprozil ] Diarrhea   Citalopram      Other reaction(s): Not available   Codeine Itching and Nausea And Vomiting   Demerol  [Meperidine ] Other (See Comments)    messes up senses   Formaldehyde    Sulfa Antibiotics Other (See  Comments)    Severe abd cramp   Zolpidem     Other reaction(s): Not available   Celexa  [Citalopram  Hydrobromide] Other (See Comments)    Mouth sores    Family History: Family History  Problem Relation Age of Onset   Rectal cancer Brother 60   Diabetes Brother    Ovarian cancer Mother 5   Hypertension Mother    Diabetes Mother    Cancer Mother    Depression Mother    Early death Mother    Obesity Mother    Hypertension Father    Diabetes Father    Depression Father    Hyperlipidemia Father    Stroke Father    Breast cancer Maternal Aunt    Breast cancer Paternal Aunt        four Paternal aunts   Heart disease Paternal Grandfather    Hyperlipidemia Paternal Grandfather    Hypertension Paternal Grandfather    Stroke Paternal Grandfather    Liver disease Sister        x 2; fatty liver   Hearing loss Maternal Grandfather    Hypertension Maternal Grandfather    Heart disease Maternal Grandmother    Hypertension Maternal Grandmother    Obesity Maternal Grandmother    Obesity Paternal Grandmother    ADD / ADHD Daughter    Depression Daughter    ADD / ADHD Daughter    Depression Daughter    Alcohol abuse Maternal Uncle    Depression Maternal Uncle    Drug abuse Maternal Uncle    Early death Maternal Uncle    Alcohol abuse Paternal Uncle    Depression Paternal Uncle    Drug abuse Paternal Uncle    Anxiety disorder Sister    Depression Sister    Hyperlipidemia Sister    Miscarriages / Stillbirths Sister  Cancer Brother    Depression Brother    Early death Brother    Cancer Maternal Aunt    Cancer Maternal Aunt    Cancer Paternal Aunt    Cancer Paternal Aunt    Cancer Paternal Aunt    Cancer Paternal Aunt    Drug abuse Brother    Heart disease Maternal Uncle    Hyperlipidemia Maternal Uncle    Hypertension Maternal Uncle    Hyperlipidemia Sister    Hyperlipidemia Brother    Hyperlipidemia Brother    Hypertension Maternal Uncle    Pancreatic cancer Neg Hx     Esophageal cancer Neg Hx     Social History:  reports that she quit smoking about 54 years ago. Her smoking use included cigarettes. She started smoking about 57 years ago. She has a 4.5 pack-year smoking history. She has been exposed to tobacco smoke. She has never used smokeless tobacco. She reports that she does not drink alcohol and does not use drugs.  ROS: All other review of systems were reviewed and are negative except what is noted above in HPI  Physical Exam: There were no vitals taken for this visit.  Constitutional:  Alert and oriented, No acute distress. HEENT:  AT, moist mucus membranes.  Trachea midline, no masses. Cardiovascular: No clubbing, cyanosis, or edema. Respiratory: Normal respiratory effort, no increased work of breathing. GI: Abdomen is soft, nontender, nondistended, no abdominal masses GU: No CVA tenderness.  Lymph: No cervical or inguinal lymphadenopathy. Skin: No rashes, bruises or suspicious lesions. Neurologic: Grossly intact, no focal deficits, moving all 4 extremities. Psychiatric: Normal mood and affect.  Laboratory Data: Lab Results  Component Value Date   WBC 17.5 (H) 03/17/2023   HGB 12.7 03/17/2023   HCT 40.1 03/17/2023   MCV 88 03/17/2023   PLT 348 03/17/2023    Lab Results  Component Value Date   CREATININE 1.44 (H) 03/17/2023    No results found for: PSA  No results found for: TESTOSTERONE  Lab Results  Component Value Date   HGBA1C 7.1 (A) 12/14/2022    Urinalysis    Component Value Date/Time   COLORURINE STRAW (A) 12/11/2021 1826   APPEARANCEUR Turbid (A) 08/31/2022 1643   LABSPEC 1.003 (L) 12/11/2021 1826   PHURINE 6.0 12/11/2021 1826   GLUCOSEU Negative 08/31/2022 1643   HGBUR NEGATIVE 12/11/2021 1826   BILIRUBINUR negative 02/28/2023 1950   BILIRUBINUR Negative 08/31/2022 1643   KETONESUR negative 02/28/2023 1950   KETONESUR NEGATIVE 12/11/2021 1826   PROTEINUR =30 (A) 02/28/2023 1950   PROTEINUR 1+  (A) 08/31/2022 1643   PROTEINUR NEGATIVE 12/11/2021 1826   UROBILINOGEN 0.2 02/28/2023 1950   NITRITE Positive (A) 02/28/2023 1950   NITRITE Negative 08/31/2022 1643   NITRITE NEGATIVE 12/11/2021 1826   LEUKOCYTESUR Large (3+) (A) 02/28/2023 1950   LEUKOCYTESUR 3+ (A) 08/31/2022 1643   LEUKOCYTESUR SMALL (A) 12/11/2021 1826    Lab Results  Component Value Date   LABMICR 105.5 08/31/2022   WBCUA >30 (A) 12/31/2016   RBCUA 0-2 12/31/2016   LABEPIT 0-10 12/31/2016   MUCUS Present 12/31/2016   BACTERIA RARE (A) 12/11/2021    Pertinent Imaging:  No results found for this or any previous visit.  No results found for this or any previous visit.  No results found for this or any previous visit.  No results found for this or any previous visit.  Results for orders placed during the hospital encounter of 09/18/19  US  RENAL  Narrative CLINICAL DATA:  Stage III B chronic renal disease.  EXAM: RENAL / URINARY TRACT ULTRASOUND COMPLETE  COMPARISON:  None.  FINDINGS: Right Kidney:  Renal measurements: 10.4 cm x 3.6 cm x 4.4 cm = volume: 84.5 mL . Echogenicity within normal limits. No mass or hydronephrosis visualized.  Left Kidney:  Renal measurements: 10.8 cm x 4.6 cm x 4.3 cm = volume: 111.2 mL. Echogenicity within normal limits. No mass or hydronephrosis visualized.  Bladder:  Appears normal for degree of bladder distention. Bilateral ureteral jets are seen.  Other:  None.  IMPRESSION: Normal bilateral renal ultrasound.   Electronically Signed By: Suzen Dials M.D. On: 09/19/2019 22:54  No results found for this or any previous visit.  No results found for this or any previous visit.  No results found for this or any previous visit.   Assessment & Plan:    1. Urinary frequency (Primary) -we will trial Gemtesa  75mg  daily.  - Urinalysis, Routine w reflex microscopic - BLADDER SCAN AMB NON-IMAGING  2. Frequent UTI We will address  OAb/incontinence prior to initiating prophylaxis for her UTIs.    No follow-ups on file.  Belvie Clara, MD  [facility] [place]

## 2023-04-06 NOTE — Patient Instructions (Signed)

## 2023-04-07 LAB — URINALYSIS, ROUTINE W REFLEX MICROSCOPIC
Bilirubin, UA: NEGATIVE
Glucose, UA: NEGATIVE
Ketones, UA: NEGATIVE
Nitrite, UA: NEGATIVE
Specific Gravity, UA: 1.025 (ref 1.005–1.030)
Urobilinogen, Ur: 1 mg/dL (ref 0.2–1.0)
pH, UA: 6 (ref 5.0–7.5)

## 2023-04-07 LAB — MICROSCOPIC EXAMINATION: WBC, UA: >30 /[HPF] — AB (ref 0–5)

## 2023-04-11 ENCOUNTER — Other Ambulatory Visit: Payer: Self-pay | Admitting: Family Medicine

## 2023-04-13 ENCOUNTER — Encounter: Payer: Self-pay | Admitting: Family Medicine

## 2023-04-14 ENCOUNTER — Ambulatory Visit (HOSPITAL_COMMUNITY)
Admission: RE | Admit: 2023-04-14 | Discharge: 2023-04-14 | Disposition: A | Discharge status: Home / Self Care | Payer: PPO | Source: Ambulatory Visit | Attending: Family Medicine | Admitting: Family Medicine

## 2023-04-14 DIAGNOSIS — R635 Abnormal weight gain: Secondary | ICD-10-CM | POA: Diagnosis not present

## 2023-04-14 DIAGNOSIS — R0789 Other chest pain: Secondary | ICD-10-CM | POA: Insufficient documentation

## 2023-04-14 DIAGNOSIS — R0609 Other forms of dyspnea: Secondary | ICD-10-CM | POA: Insufficient documentation

## 2023-04-14 DIAGNOSIS — R600 Localized edema: Secondary | ICD-10-CM | POA: Insufficient documentation

## 2023-04-14 DIAGNOSIS — E1122 Type 2 diabetes mellitus with diabetic chronic kidney disease: Secondary | ICD-10-CM | POA: Diagnosis not present

## 2023-04-14 LAB — ECHOCARDIOGRAM COMPLETE
AR max vel: 0.97 cm2
AV Area VTI: 1 cm2
AV Area mean vel: 1.01 cm2
AV Mean grad: 7.7 mm[Hg]
AV Peak grad: 14.8 mm[Hg]
Ao pk vel: 1.92 m/s
Area-P 1/2: 4.21 cm2
S' Lateral: 2.9 cm

## 2023-04-14 NOTE — Progress Notes (Signed)
*  PRELIMINARY RESULTS* Echocardiogram 2D Echocardiogram has been performed.  Stacey Drain 04/14/2023, 4:15 PM

## 2023-04-20 DIAGNOSIS — R0789 Other chest pain: Secondary | ICD-10-CM | POA: Diagnosis not present

## 2023-04-20 DIAGNOSIS — R0609 Other forms of dyspnea: Secondary | ICD-10-CM | POA: Diagnosis not present

## 2023-04-21 ENCOUNTER — Encounter: Payer: Self-pay | Admitting: Family Medicine

## 2023-04-21 ENCOUNTER — Ambulatory Visit: Payer: PPO | Admitting: Family Medicine

## 2023-04-21 LAB — CBC WITH DIFFERENTIAL/PLATELET
Basophils Absolute: 0.1 10*3/uL (ref 0.0–0.2)
Basos: 1 %
EOS (ABSOLUTE): 0.5 10*3/uL — ABNORMAL HIGH (ref 0.0–0.4)
Eos: 5 %
Hematocrit: 41.2 % (ref 34.0–46.6)
Hemoglobin: 13 g/dL (ref 11.1–15.9)
Immature Grans (Abs): 0.1 10*3/uL (ref 0.0–0.1)
Immature Granulocytes: 1 %
Lymphocytes Absolute: 1.6 10*3/uL (ref 0.7–3.1)
Lymphs: 18 %
MCH: 27.4 pg (ref 26.6–33.0)
MCHC: 31.6 g/dL (ref 31.5–35.7)
MCV: 87 fL (ref 79–97)
Monocytes Absolute: 0.6 10*3/uL (ref 0.1–0.9)
Monocytes: 7 %
Neutrophils Absolute: 6.4 10*3/uL (ref 1.4–7.0)
Neutrophils: 68 %
Platelets: 415 10*3/uL (ref 150–450)
RBC: 4.74 x10E6/uL (ref 3.77–5.28)
RDW: 13.8 % (ref 11.7–15.4)
WBC: 9.3 10*3/uL (ref 3.4–10.8)

## 2023-04-26 ENCOUNTER — Ambulatory Visit (INDEPENDENT_AMBULATORY_CARE_PROVIDER_SITE_OTHER): Payer: PPO | Admitting: Family Medicine

## 2023-04-26 VITALS — BP 134/82 | HR 82 | Temp 98.2°F | Ht 59.0 in | Wt 217.8 lb

## 2023-04-26 DIAGNOSIS — R600 Localized edema: Secondary | ICD-10-CM | POA: Diagnosis not present

## 2023-04-26 DIAGNOSIS — E1122 Type 2 diabetes mellitus with diabetic chronic kidney disease: Secondary | ICD-10-CM | POA: Diagnosis not present

## 2023-04-26 DIAGNOSIS — I10 Essential (primary) hypertension: Secondary | ICD-10-CM | POA: Diagnosis not present

## 2023-04-26 DIAGNOSIS — N183 Chronic kidney disease, stage 3 unspecified: Secondary | ICD-10-CM

## 2023-04-26 DIAGNOSIS — R296 Repeated falls: Secondary | ICD-10-CM

## 2023-04-26 DIAGNOSIS — I129 Hypertensive chronic kidney disease with stage 1 through stage 4 chronic kidney disease, or unspecified chronic kidney disease: Secondary | ICD-10-CM | POA: Diagnosis not present

## 2023-04-26 DIAGNOSIS — I5030 Unspecified diastolic (congestive) heart failure: Secondary | ICD-10-CM

## 2023-04-26 DIAGNOSIS — N1832 Chronic kidney disease, stage 3b: Secondary | ICD-10-CM

## 2023-04-26 NOTE — Progress Notes (Signed)
Subjective:    Patient ID: Kristen Mack, female    DOB: 05-24-1945, 78 y.o.   MRN: [id number]  Discussed the use of AI scribe software for clinical note transcription with the patient, who gave verbal consent to proceed.  History of Present Illness   The patient presents with weakness and balance issues. She is accompanied by Amy, who appears to be a caregiver or family member.  She experiences weakness and balance issues, particularly when making turns. An incident last weekend involved nearly falling forward in the bathroom, but she managed to slide down a cabinet to the ground. She was unable to get up due to weakness in her thigh muscles, which she attributes to the late hour of the incident. She uses a trolley or cane for walking and is very conscious of her movements, noting that lifting her foot can put her off balance. No dizziness is reported.  She reports swelling in her legs, particularly in one leg, which occasionally 'poofs out' more than the other. She experiences soreness in her right knee and uses a heating pad or tart cherry supplements for relief. She has a history of venous insufficiency and has used knee-high surgical support stockings in the past.  She is currently taking Gemtesa, which she finds effective in managing urinary symptoms, reducing the frequency of needing to change her.  She experiences bowel irregularities, likely due to IBS, with episodes of frequent bowel movements followed by days without any. No blood is seen in her bowel movements.  She has lost some weight and notes that her legs and middle are not as large as she was previously. She has a history of osteoporosis and receives Prolia injections. She had a bone density test last year and is scheduled for another in April.         Review of Systems     Objective:    Physical Exam   VITALS: BP- 134/82 CHEST: Lungs clear to auscultation. CARDIOVASCULAR: Heart sounds normal. EXTREMITIES:  Asymmetrical swelling of legs, more pronounced on one side. No tenderness on palpation. Localized tenderness on the outside of right knee.           Assessment & Plan:  Assessment and Plan    Balance impairment Frequent falls and difficulty with balance. Patient uses a cane or trolley for ambulation. Recent fall without injury. Discussed the importance of balance exercises. -Encouraged to start balance exercises. -Advised to continue using assistive devices for ambulation.  Venous insufficiency Chronic lower extremity edema, more pronounced in the right leg. Discussed the role of venous insufficiency and the use of compression stockings. -Encouraged to use compression stockings, particularly in the morning.  Osteoporosis On Prolia for persistent osteoporosis. Last bone density scan in 2024. -Continue Prolia as prescribed. -Follow-up with Dr. Janett Billow in April 2025.  Hypothyroidism On thyroid medication. Recent compliance issues resolved. -Continue thyroid medication as prescribed.  Hyperlipidemia On cholesterol medication four days a week. -Continue cholesterol medication as prescribed.  Urinary incontinence On Gemtesa. Reports improvement in symptoms. -Continue Gemtesa as prescribed.  Follow-up in 4 months. Review labs ordered by Dr. Janett Billow prior to next visit.     1. Pedal edema (Primary) Does not have any pitting edema currently will not at this point increase the dose of her diuretic.  Maintain things as is follow-up 4 months  2. HTN (hypertension), benign Blood pressure good control maintain things as is follow-up 4 months  3. Stage 3b chronic kidney disease (HCC) Lab work will be  ordered in 4 months depending on what her specialist order  4. Frequent falls Balance exercises encouraged strengthening exercises of quadriceps encouraged  Morbid obesity portion control recommended Diabetes will do comprehensive lab work later this spring with her endocrinologist follow  their guidelines Heart failure stable currently BNP not significantly elevated CKD follows with nephrologist on a regular basis stable currently Repeat CBC shows eosinophilia's have gone down  No active congestive heart failure currently its under good control No worrisome pedal edema currently. Venous insufficiency present recommend knee-high surgical support stocking right side Doubt that the patient has a DVT on the right side based on physical exam and discussion with the patient if she starts having significant leg pain discomfort and increased swelling to follow-up we will do ultrasound at that point

## 2023-05-01 ENCOUNTER — Other Ambulatory Visit: Payer: Self-pay | Admitting: "Endocrinology

## 2023-05-01 DIAGNOSIS — E1122 Type 2 diabetes mellitus with diabetic chronic kidney disease: Secondary | ICD-10-CM

## 2023-05-02 ENCOUNTER — Other Ambulatory Visit: Payer: Self-pay | Admitting: "Endocrinology

## 2023-05-02 DIAGNOSIS — E1122 Type 2 diabetes mellitus with diabetic chronic kidney disease: Secondary | ICD-10-CM

## 2023-05-11 ENCOUNTER — Ambulatory Visit: Payer: PPO | Admitting: Family Medicine

## 2023-05-11 ENCOUNTER — Ambulatory Visit: Payer: PPO | Admitting: Cardiovascular Disease

## 2023-05-15 DIAGNOSIS — E1122 Type 2 diabetes mellitus with diabetic chronic kidney disease: Secondary | ICD-10-CM | POA: Diagnosis not present

## 2023-05-15 IMAGING — CT CT HEAD W/O CM
3 series · 15 of 47 positions shown, 18 images · non-contrast
Comparison: None.

CLINICAL DATA: Fall.  Scalp laceration.



[Series 2: head w o · axial · 0.47mm/px · z∈[-37,+113]mm · 9 of 36 slices shown, 12 images]
[im 3/36  brain]
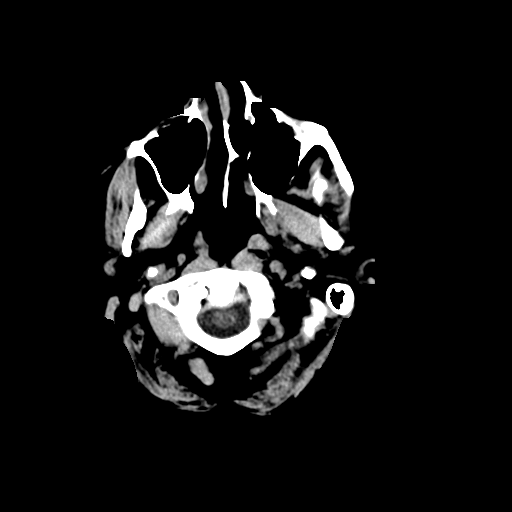
[im 3/36  bone]
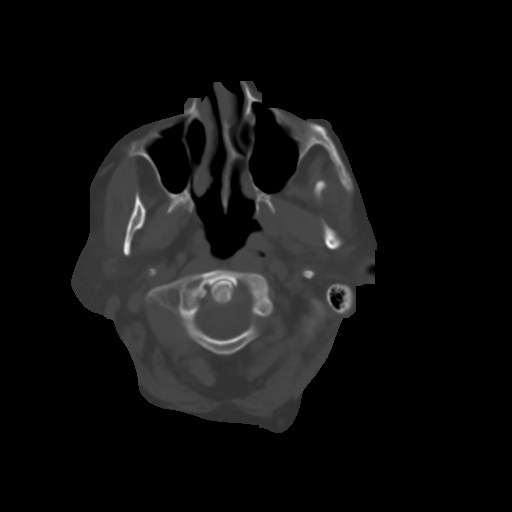
[im 7/36  brain]
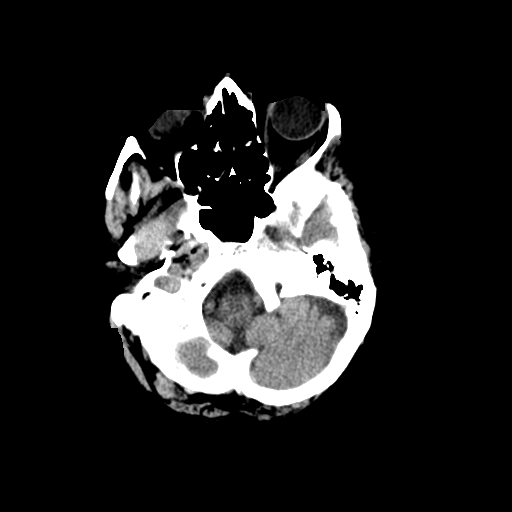
[im 10/36  brain]
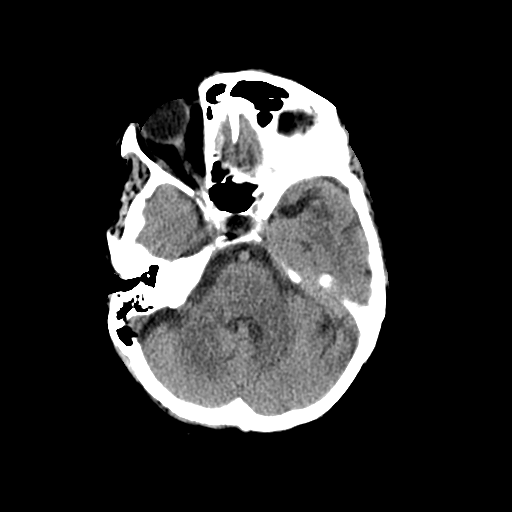
[im 14/36  brain]
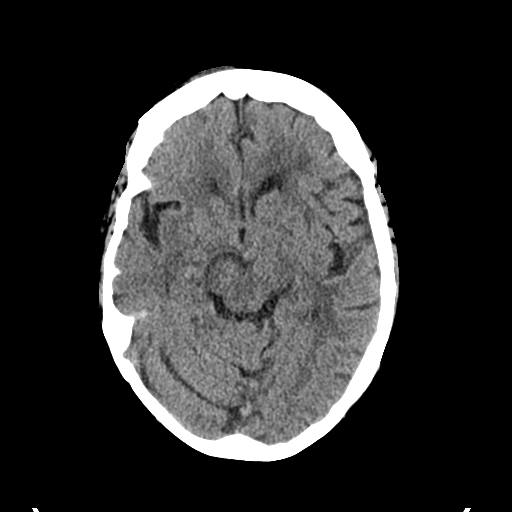
[im 19/36  brain]
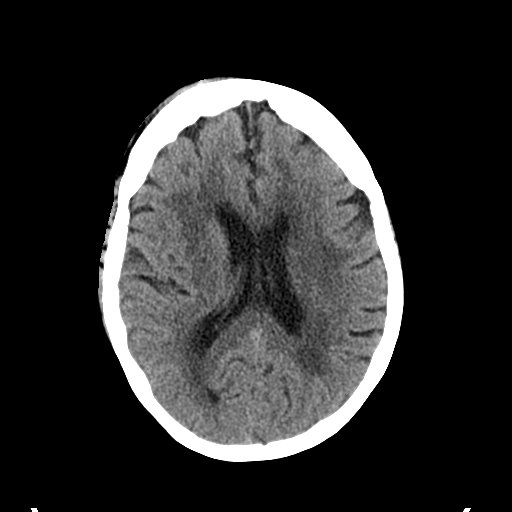
[im 19/36  bone]
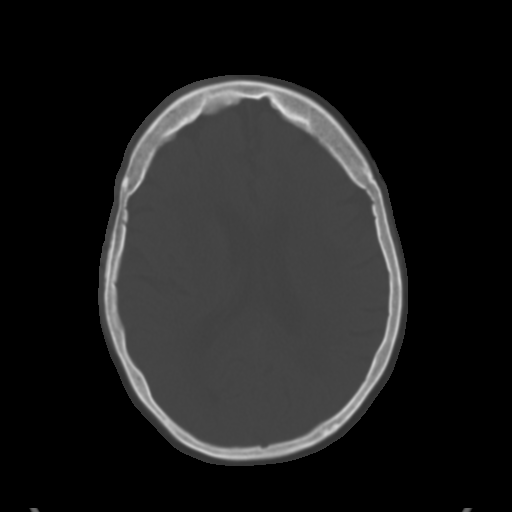
[im 22/36  brain]
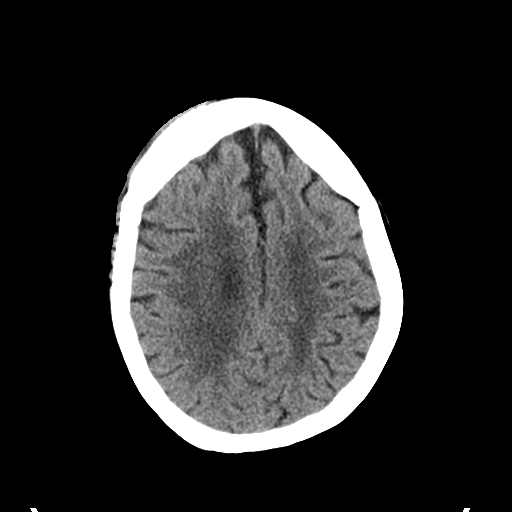
[im 26/36  brain]
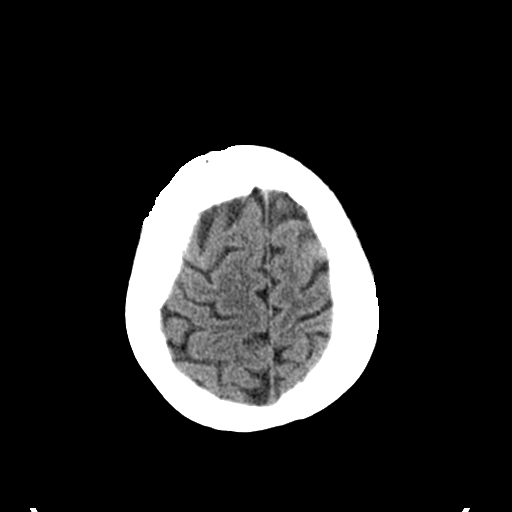
[im 29/36  brain]
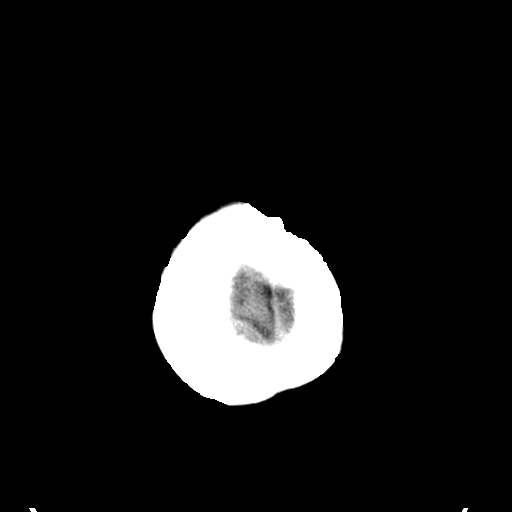
[im 33/36  brain]
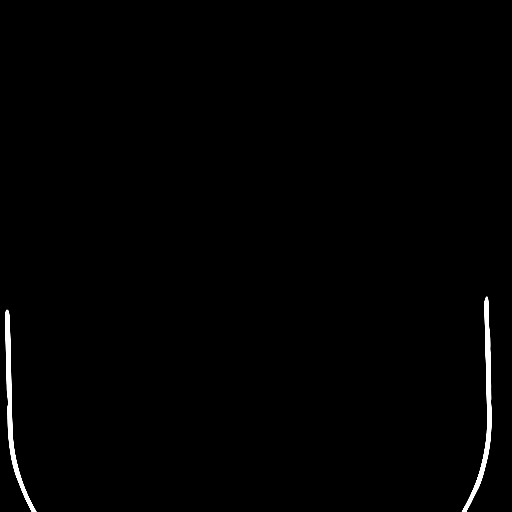
[im 33/36  bone]
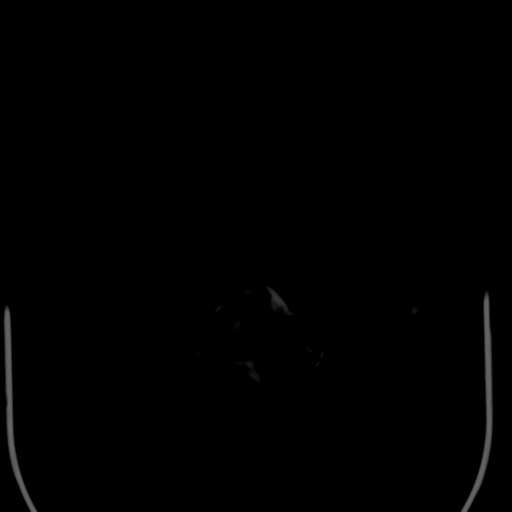

[Series 4: coronal soft · coronal · 0.30mm/px · 3 of 69 slices shown]
[im 23/69  brain]
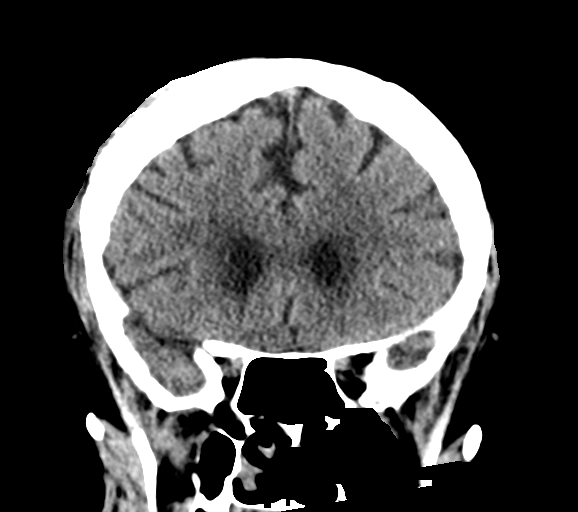
[im 31/69  brain]
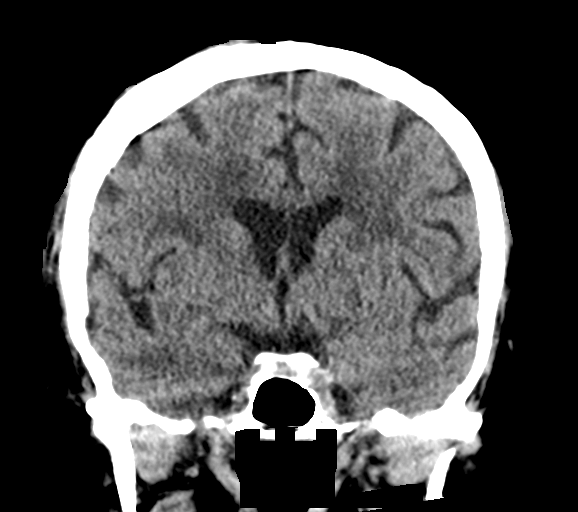
[im 38/69  brain]
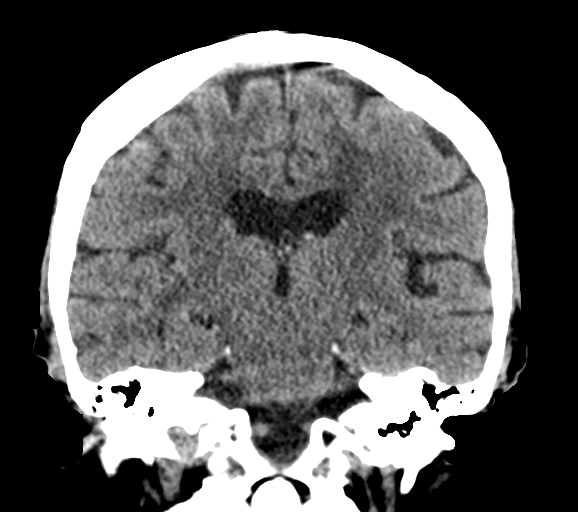

[Series 5: sagittal soft · sagittal · 0.34mm/px · 3 of 58 slices shown]
[im 23/58  brain]
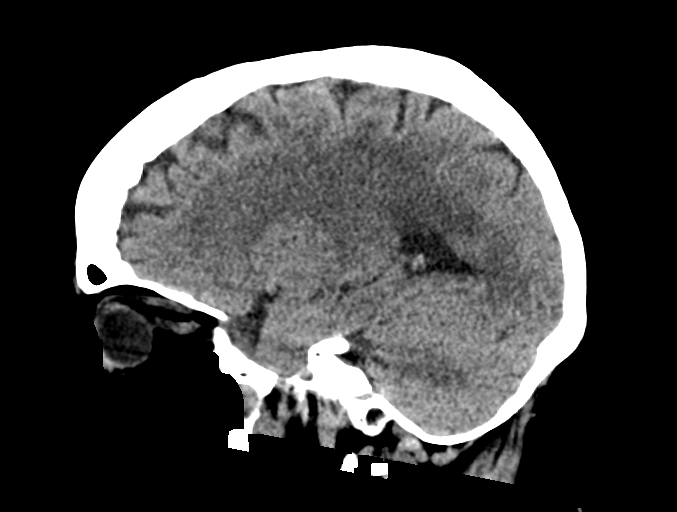
[im 29/58  brain]
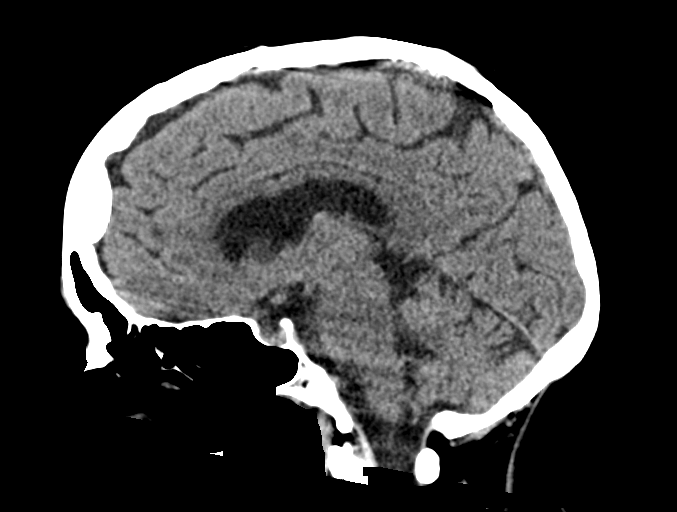
[im 36/58  brain]
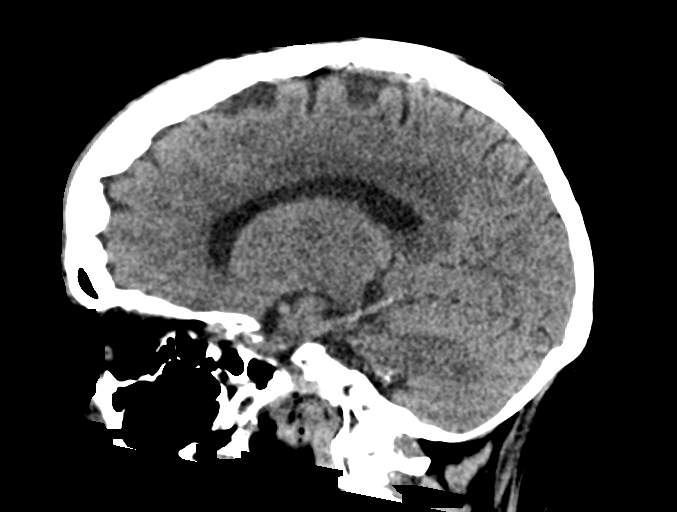

[15 of 47 positions shown; findings below may reference images not displayed]

FINDINGS: Brain: There is no evidence for acute hemorrhage, hydrocephalus,
mass lesion, or abnormal extra-axial fluid collection. No definite
CT evidence for acute infarction. Diffuse loss of parenchymal volume
is consistent with atrophy. Patchy low attenuation in the deep
hemispheric and periventricular white matter is nonspecific, but
likely reflects chronic microvascular ischemic demyelination.

Vascular: No hyperdense vessel or unexpected calcification.

Skull: No evidence for fracture. No worrisome lytic or sclerotic
lesion.

Sinuses/Orbits: The visualized paranasal sinuses and mastoid air
cells are clear. Visualized portions of the globes and intraorbital
fat are unremarkable.

Other: Right frontal scalp laceration noted.
IMPRESSION: 1. No acute intracranial abnormality.
2. Right frontal scalp laceration. No underlying skull fracture.
3. Atrophy with chronic small vessel ischemic disease.

## 2023-05-17 ENCOUNTER — Telehealth: Payer: Self-pay | Admitting: Urology

## 2023-05-17 DIAGNOSIS — R350 Frequency of micturition: Secondary | ICD-10-CM

## 2023-05-17 MED ORDER — GEMTESA 75 MG PO TABS: 75.0000 mg | ORAL_TABLET | Freq: Every day | ORAL | 1 refills | Status: DC

## 2023-05-17 NOTE — Telephone Encounter (Signed)
Patient's daughter called and state's that they can not afford the [facility] Rx and would like to a different alternative  that is more affordable. Amy is aware a message will be sent to the Md and  voiced understanding

## 2023-05-17 NOTE — Telephone Encounter (Signed)
 Patient is made aware and voiced understanding.

## 2023-05-17 NOTE — Telephone Encounter (Signed)
Daughter called pt needs a RX of Gemtesa called into [facility].

## 2023-05-18 ENCOUNTER — Ambulatory Visit: Payer: PPO | Admitting: Urology

## 2023-05-19 ENCOUNTER — Telehealth: Payer: Self-pay | Admitting: "Endocrinology

## 2023-05-19 NOTE — Telephone Encounter (Signed)
Faxed over pt's last OV notes from September.

## 2023-05-19 NOTE — Telephone Encounter (Signed)
Pt left a VM stating that [employer] told her she needs to be seen sooner than 4/17- please advise, as we are already overbooked.

## 2023-05-20 ENCOUNTER — Other Ambulatory Visit: Payer: Self-pay

## 2023-05-20 MED ORDER — MIRABEGRON ER 25 MG PO TB24: 25.0000 mg | ORAL_TABLET | Freq: Every day | ORAL | 11 refills | Status: DC

## 2023-05-20 NOTE — Telephone Encounter (Signed)
Daughter called again to follow up on alternative medication due to patient not being able to afford Gemtesa.  Verbal from Dr. Ronne Binning ok to send in Myrbetriq 25mg .  Rx sent and patient notified via mychart.

## 2023-05-23 ENCOUNTER — Ambulatory Visit: Payer: PPO | Admitting: Urology

## 2023-05-25 ENCOUNTER — Ambulatory Visit: Payer: PPO | Admitting: Family Medicine

## 2023-06-01 ENCOUNTER — Other Ambulatory Visit: Payer: Self-pay | Admitting: "Endocrinology

## 2023-06-01 ENCOUNTER — Ambulatory Visit: Payer: PPO | Admitting: Urology

## 2023-06-01 DIAGNOSIS — E1122 Type 2 diabetes mellitus with diabetic chronic kidney disease: Secondary | ICD-10-CM

## 2023-06-01 DIAGNOSIS — R350 Frequency of micturition: Secondary | ICD-10-CM | POA: Diagnosis not present

## 2023-06-01 MED ORDER — MIRABEGRON ER 25 MG PO TB24: 25.0000 mg | ORAL_TABLET | Freq: Every day | ORAL | 11 refills | Status: DC

## 2023-06-01 NOTE — Progress Notes (Signed)
 06/01/2023 1:39 PM   Kristen Mack May 20, 1945 [id number]  Referring provider: Babs Sciara, MD [address] [place],  [place] [address]  Followup urinary frequency   HPI: Ms Kristen Mack is a 78yo here for followup for urinary frequency. She was started on gemtesa last visit and then was switched to mirabegron. She is doing well on mirabegron 25mg  daily. She is using 1 pad per day. No UTIs since last visit.    PMH: Past Medical History:  Diagnosis Date   ADHD (attention deficit hyperactivity disorder) 2007   Adrenal adenoma, left    followed by dr nida   Arthritis    Asthma    followed by pcp   CFS (chronic fatigue syndrome)    CKD (chronic kidney disease), stage III [facility])    nephrologist---  dr Marisue Humble   Depression    Edema of both lower extremities    Fatty liver disease, nonalcoholic    Fibromyalgia    GERD (gastroesophageal reflux disease)    Hiatal hernia    History of adenomatous polyp of colon    History of cardiac murmur    until age 77- pt has had echocardiograms   History of TIA (transient ischemic attack)    per CT scan    Hyperlipidemia    Hypertension    Hypothyroidism    endocrinologist-- dr g. nida   IBS (irritable bowel syndrome)    Meniere disease    OSA on CPAP    per pt uses nightly   Osteopenia    Pancreas cyst    followed by pcp   PMB (postmenopausal bleeding)    PVC's (premature ventricular contractions)    cardiologist--- dr Flora Lipps   Type 2 diabetes mellitus treated with insulin [facility])    endocrinologist-- dr g. nida;    (08-14-2021  per pt checks blood sugar QID ,  fasting average --- 73-150)   Urinary incontinence    Wears glasses    Wears partial dentures    upper    Surgical History: Past Surgical History:  Procedure Laterality Date   CERVICAL POLYPECTOMY N/A 08/17/2021   Procedure: BIOPSY OF ENDOMETRIUM AND OR POLYPECTOMY;  Surgeon: Essie Hart, MD;  Location: [facility];  Service: Gynecology;   Laterality: N/A;   CESAREAN SECTION  1977  &  1979   CHOLECYSTECTOMY  1995   and  REPAIR UMBILICAL HERNIA   COLONOSCOPY N/A 04/19/2013   Procedure: COLONOSCOPY;  Surgeon: Malissa Hippo, MD;  [facility];  Service: Endoscopy;  Laterality: N/A;     DILITATION & CURRETTAGE/HYSTROSCOPY WITH NOVASURE ABLATION N/A 08/17/2021   Procedure: DILATATION & CURETTAGE/HYSTEROSCOPY;  Surgeon: Essie Hart, MD;  Location: [facility];  Service: Gynecology;  Laterality: N/A;   ESOPHAGOGASTRODUODENOSCOPY  02/08/2003   HERNIA REPAIR  1995   HYSTEROSCOPY WITH D & C  11/14/2002   endometrial polypectomy   HYSTEROSCOPY WITH D & C N/A 04/05/2014   Procedure: DILATATION AND CURETTAGE /HYSTEROSCOPY, ENDOMETRIAL POLYPECTOMY;  Surgeon: Essie Hart, MD;  Location: [facility];  Service: Gynecology;  Laterality: N/A;   LIVER BIOPSY  01/29/2004   benign   TUBAL LIGATION  1979    Home Medications:  Allergies as of 06/01/2023       Reactions   Latex Other (See Comments), Itching   "skin gets raw"   Ambien [zolpidem Tartrate] Other (See Comments)   Side effect "lost a couple days"; memory loss   Cefzil [cefprozil] Diarrhea  Citalopram    Other reaction(s): Not available   Codeine Itching, Nausea And Vomiting   Demerol [meperidine] Other (See Comments)   "messes up senses"   Formaldehyde    Sulfa Antibiotics Other (See Comments)   Severe abd cramp   Zolpidem    Other reaction(s): Not available   Celexa [citalopram Hydrobromide] Other (See Comments)   Mouth sores        Medication List        Accurate as of June 01, 2023  1:39 PM. If you have any questions, ask your nurse or doctor.          albuterol (2.5 MG/3ML) 0.083% nebulizer solution Commonly known as: PROVENTIL Take 3 mLs (2.5 mg total) by nebulization every 6 (six) hours as needed for wheezing or shortness of breath.   albuterol 108 (90 Base) MCG/ACT inhaler Commonly known as: VENTOLIN  HFA Inhale 2 puffs into the lungs every 4 (four) hours as needed. INHALE 2 PUFFS INTO LUNGS EVERY 6 HOURS AS NEEDED FOR SHORTNESS OF BREATH   Azelastine HCl 137 MCG/SPRAY Soln USE 2 SPRAY(S) IN EACH NOSTRIL TWICE DAILY   B COMPLEX 100 PO Take 1 tablet by mouth daily.   B-12 1000 MCG Subl Place 1,000 mcg under the tongue daily.   blood glucose meter kit and supplies Dispense based on patient and insurance preference. Use to test sugars BID  (FOR ICD-10 E10.9, E11.9).   buPROPion 300 MG 24 hr tablet Commonly known as: WELLBUTRIN XL Take 1 tablet (300 mg total) by mouth daily.   Chromium Picolinate 1000 MCG Tabs Take 1 tablet by mouth daily.   CoQ10 200 MG Caps Take 200 mg by mouth daily.   DIGESTIVE ENZYMES PO Take 1 tablet by mouth 3 (three) times daily.   estradiol 0.1 MG/GM vaginal cream Commonly known as: ESTRACE VAGINAL 1 applicator intravaginal Sunday and thursday   FreeStyle [place] 2 Reader Hardie Pulley As directed   [employer] 2 Sensor Misc 1 Piece by Does not apply route every 14 (fourteen) days.   furosemide 20 MG tablet Commonly known as: LASIX 1 qam prn pedal edema   gabapentin 100 MG capsule Commonly known as: NEURONTIN Take 1 capsule (100 mg total) by mouth 2 (two) times daily.   glucose blood test strip Use to check blood glucose four times daily as instructed   levothyroxine 125 MCG tablet Commonly known as: SYNTHROID Take 1 tablet (125 mcg total) by mouth every evening.   Lysine HCl 500 MG Tabs Take 500 mg by mouth daily.   Magnesium 250 MG Tabs Take 250 mg by mouth 2 (two) times daily.   meclizine 25 MG tablet Commonly known as: ANTIVERT Take 25 mg by mouth 3 (three) times daily as needed for dizziness.   mirabegron ER 25 MG Tb24 tablet Commonly known as: MYRBETRIQ Take 1 tablet (25 mg total) by mouth daily.   montelukast 10 MG tablet Commonly known as: SINGULAIR TAKE 1 TABLET BY MOUTH AT BEDTIME   OVER THE COUNTER MEDICATION Take 1  Scoop by mouth 3 (three) times daily with meals. Fennel seeds   OVER THE COUNTER MEDICATION Choline and inisotol   Prolia 60 MG/ML Sosy injection Generic drug: denosumab INJECT 60 MG SUBCUTANEOUSLY ONCE FOR 1 DOSE   ReliOn Pen Needles 31G X 6 MM Misc Generic drug: Insulin Pen Needle USE 1 PEN NEEDLE TO INJECT INSULIN ONCE DAILY   rosuvastatin 5 MG tablet Commonly known as: CRESTOR TAKE 1 TABLET BY MOUTH ON MONDAY,  WEDNESDAY,  FRIDAY AND SATURDAY   spironolactone 25 MG tablet Commonly known as: ALDACTONE Take 0.5 tablets (12.5 mg total) by mouth daily.   TART CHERRY PO Take 2 tablets by mouth 3 (three) times daily.   tiZANidine 2 MG tablet Commonly known as: ZANAFLEX TAKE 1 TABLET BY MOUTH AT BEDTIME AS NEEDED, MAY CAUSE DROWSINESS   Tresiba FlexTouch 100 UNIT/ML FlexTouch Pen Generic drug: insulin degludec INJECT 46 UNITS SUBCUTANEOUSLY ONCE DAILY   Vitamin A 3 MG (10000 UT) Tabs Take 10,000 Units by mouth daily.   vitamin E 180 MG (400 UNITS) capsule Take 800 Units by mouth daily.        Allergies:  Allergies  Allergen Reactions   Latex Other (See Comments) and Itching    "skin gets raw"   Ambien [Zolpidem Tartrate] Other (See Comments)    Side effect "lost a couple days"; memory loss   Cefzil [Cefprozil] Diarrhea   Citalopram     Other reaction(s): Not available   Codeine Itching and Nausea And Vomiting   Demerol [Meperidine] Other (See Comments)    "messes up senses"   Formaldehyde    Sulfa Antibiotics Other (See Comments)    Severe abd cramp   Zolpidem     Other reaction(s): Not available   Celexa [Citalopram Hydrobromide] Other (See Comments)    Mouth sores    Family History: Family History  Problem Relation Age of Onset   Rectal cancer Brother 60   Diabetes Brother    Ovarian cancer Mother 67   Hypertension Mother    Diabetes Mother    Cancer Mother    Depression Mother    Early death Mother    Obesity Mother    Hypertension Father     Diabetes Father    Depression Father    Hyperlipidemia Father    Stroke Father    Breast cancer Maternal Aunt    Breast cancer Paternal Aunt        four Paternal aunts   Heart disease Paternal Grandfather    Hyperlipidemia Paternal Grandfather    Hypertension Paternal Grandfather    Stroke Paternal Grandfather    Liver disease Sister        x 2; fatty liver   Hearing loss Maternal Grandfather    Hypertension Maternal Grandfather    Heart disease Maternal Grandmother    Hypertension Maternal Grandmother    Obesity Maternal Grandmother    Obesity Paternal Grandmother    ADD / ADHD Daughter    Depression Daughter    ADD / ADHD Daughter    Depression Daughter    Alcohol abuse Maternal Uncle    Depression Maternal Uncle    Drug abuse Maternal Uncle    Early death Maternal Uncle    Alcohol abuse Paternal Uncle    Depression Paternal Uncle    Drug abuse Paternal Uncle    Anxiety disorder Sister    Depression Sister    Hyperlipidemia Sister    Miscarriages / Stillbirths Sister    Cancer Brother    Depression Brother    Early death Brother    Cancer Maternal Aunt    Cancer Maternal Aunt    Cancer Paternal Aunt    Cancer Paternal Aunt    Cancer Paternal Aunt    Cancer Paternal Aunt    Drug abuse Brother    Heart disease Maternal Uncle    Hyperlipidemia Maternal Uncle    Hypertension Maternal Uncle    Hyperlipidemia Sister    Hyperlipidemia  Brother    Hyperlipidemia Brother    Hypertension Maternal Uncle    Pancreatic cancer Neg Hx    Esophageal cancer Neg Hx     Social History:  reports that she quit smoking about 54 years ago. Her smoking use included cigarettes. She started smoking about 57 years ago. She has a 4.5 pack-year smoking history. She has been exposed to tobacco smoke. She has never used smokeless tobacco. She reports that she does not drink alcohol and does not use drugs.  ROS: All other review of systems were reviewed and are negative except what is  noted above in HPI  Physical Exam: There were no vitals taken for this visit.  Constitutional:  Alert and oriented, No acute distress. HEENT: [place] AT, moist mucus membranes.  Trachea midline, no masses. Cardiovascular: No clubbing, cyanosis, or edema. Respiratory: Normal respiratory effort, no increased work of breathing. GI: Abdomen is soft, nontender, nondistended, no abdominal masses GU: No CVA tenderness.  Lymph: No cervical or inguinal lymphadenopathy. Skin: No rashes, bruises or suspicious lesions. Neurologic: Grossly intact, no focal deficits, moving all 4 extremities. Psychiatric: Normal mood and affect.  Laboratory Data: Lab Results  Component Value Date   WBC 9.3 04/20/2023   HGB 13.0 04/20/2023   HCT 41.2 04/20/2023   MCV 87 04/20/2023   PLT 415 04/20/2023    Lab Results  Component Value Date   CREATININE 1.44 (H) 03/17/2023    No results found for: "PSA"  No results found for: "TESTOSTERONE"  Lab Results  Component Value Date   HGBA1C 7.1 (A) 12/14/2022    Urinalysis    Component Value Date/Time   COLORURINE STRAW (A) 12/11/2021 1826   APPEARANCEUR Clear 04/06/2023 1615   LABSPEC 1.003 (L) 12/11/2021 1826   PHURINE 6.0 12/11/2021 1826   GLUCOSEU Negative 04/06/2023 1615   HGBUR NEGATIVE 12/11/2021 1826   BILIRUBINUR Negative 04/06/2023 1615   KETONESUR negative 02/28/2023 1950   KETONESUR NEGATIVE 12/11/2021 1826   PROTEINUR 2+ (A) 04/06/2023 1615   PROTEINUR NEGATIVE 12/11/2021 1826   UROBILINOGEN 0.2 02/28/2023 1950   NITRITE Negative 04/06/2023 1615   NITRITE NEGATIVE 12/11/2021 1826   LEUKOCYTESUR 2+ (A) 04/06/2023 1615   LEUKOCYTESUR SMALL (A) 12/11/2021 1826    Lab Results  Component Value Date   LABMICR See below: 04/06/2023   WBCUA >30 (A) 04/06/2023   RBCUA 0-2 12/31/2016   LABEPIT 0-10 04/06/2023   MUCUS Present 12/31/2016   BACTERIA Moderate (A) 04/06/2023    Pertinent Imaging: *** No results found for this or any previous  visit.  No results found for this or any previous visit.  No results found for this or any previous visit.  No results found for this or any previous visit.  Results for orders placed during the hospital encounter of 09/18/19  US RENAL  Narrative CLINICAL DATA:  Stage III B chronic renal disease.  EXAM: RENAL / URINARY TRACT ULTRASOUND COMPLETE  COMPARISON:  None.  FINDINGS: Right Kidney:  Renal measurements: 10.4 cm x 3.6 cm x 4.4 cm = volume: 84.5 mL . Echogenicity within normal limits. No mass or hydronephrosis visualized.  Left Kidney:  Renal measurements: 10.8 cm x 4.6 cm x 4.3 cm = volume: 111.2 mL. Echogenicity within normal limits. No mass or hydronephrosis visualized.  Bladder:  Appears normal for degree of bladder distention. Bilateral ureteral jets are seen.  Other:  None.  IMPRESSION: Normal bilateral renal ultrasound.   Electronically Signed By: Aram Candela M.D. On: 09/19/2019 22:54  No results found for this or any previous visit.  No results found for this or any previous visit.  No results found for this or any previous visit.   Assessment & Plan:    1. Urinary frequency (Primary) -continue mirabegron 25mg  daily.  Followup 6 months   No follow-ups on file.  Wilkie Aye, MD  [facility] [place]

## 2023-06-02 ENCOUNTER — Other Ambulatory Visit: Payer: Self-pay | Admitting: "Endocrinology

## 2023-06-02 DIAGNOSIS — N183 Chronic kidney disease, stage 3 unspecified: Secondary | ICD-10-CM

## 2023-06-07 ENCOUNTER — Encounter: Payer: Self-pay | Admitting: Urology

## 2023-06-07 NOTE — Patient Instructions (Signed)

## 2023-06-12 DIAGNOSIS — E1122 Type 2 diabetes mellitus with diabetic chronic kidney disease: Secondary | ICD-10-CM | POA: Diagnosis not present

## 2023-06-13 ENCOUNTER — Telehealth: Payer: Self-pay | Admitting: "Endocrinology

## 2023-06-13 ENCOUNTER — Other Ambulatory Visit: Payer: Self-pay | Admitting: *Deleted

## 2023-06-13 DIAGNOSIS — D3502 Benign neoplasm of left adrenal gland: Secondary | ICD-10-CM

## 2023-06-13 DIAGNOSIS — E038 Other specified hypothyroidism: Secondary | ICD-10-CM

## 2023-06-13 DIAGNOSIS — M810 Age-related osteoporosis without current pathological fracture: Secondary | ICD-10-CM

## 2023-06-13 DIAGNOSIS — E119 Type 2 diabetes mellitus without complications: Secondary | ICD-10-CM

## 2023-06-13 DIAGNOSIS — N183 Chronic kidney disease, stage 3 unspecified: Secondary | ICD-10-CM

## 2023-06-13 DIAGNOSIS — E782 Mixed hyperlipidemia: Secondary | ICD-10-CM

## 2023-06-13 NOTE — Telephone Encounter (Signed)
Labs have been updated . 

## 2023-06-13 NOTE — Telephone Encounter (Signed)
 Pt needs labs updated, she has to go this week for appt next week

## 2023-06-17 ENCOUNTER — Emergency Department (HOSPITAL_COMMUNITY)

## 2023-06-17 ENCOUNTER — Encounter (HOSPITAL_COMMUNITY): Payer: Self-pay | Admitting: Emergency Medicine

## 2023-06-17 ENCOUNTER — Emergency Department (HOSPITAL_COMMUNITY)
Admission: EM | Admit: 2023-06-17 | Discharge: 2023-06-17 | Disposition: A | Discharge status: Home / Self Care | Attending: Student | Admitting: Student

## 2023-06-17 ENCOUNTER — Other Ambulatory Visit: Payer: Self-pay

## 2023-06-17 DIAGNOSIS — Y92000 Kitchen of unspecified non-institutional (private) residence as  the place of occurrence of the external cause: Secondary | ICD-10-CM | POA: Diagnosis not present

## 2023-06-17 DIAGNOSIS — W010XXA Fall on same level from slipping, tripping and stumbling without subsequent striking against object, initial encounter: Secondary | ICD-10-CM | POA: Insufficient documentation

## 2023-06-17 DIAGNOSIS — Z794 Long term (current) use of insulin: Secondary | ICD-10-CM | POA: Insufficient documentation

## 2023-06-17 DIAGNOSIS — R079 Chest pain, unspecified: Secondary | ICD-10-CM | POA: Diagnosis not present

## 2023-06-17 DIAGNOSIS — N1832 Chronic kidney disease, stage 3b: Secondary | ICD-10-CM | POA: Insufficient documentation

## 2023-06-17 DIAGNOSIS — J45909 Unspecified asthma, uncomplicated: Secondary | ICD-10-CM | POA: Diagnosis not present

## 2023-06-17 DIAGNOSIS — Z79899 Other long term (current) drug therapy: Secondary | ICD-10-CM | POA: Diagnosis not present

## 2023-06-17 DIAGNOSIS — S0990XA Unspecified injury of head, initial encounter: Secondary | ICD-10-CM | POA: Insufficient documentation

## 2023-06-17 DIAGNOSIS — M25511 Pain in right shoulder: Secondary | ICD-10-CM | POA: Diagnosis present

## 2023-06-17 DIAGNOSIS — S2241XA Multiple fractures of ribs, right side, initial encounter for closed fracture: Secondary | ICD-10-CM | POA: Diagnosis not present

## 2023-06-17 DIAGNOSIS — R0781 Pleurodynia: Secondary | ICD-10-CM | POA: Diagnosis not present

## 2023-06-17 DIAGNOSIS — S42291A Other displaced fracture of upper end of right humerus, initial encounter for closed fracture: Secondary | ICD-10-CM | POA: Diagnosis not present

## 2023-06-17 DIAGNOSIS — S42352A Displaced comminuted fracture of shaft of humerus, left arm, initial encounter for closed fracture: Secondary | ICD-10-CM | POA: Diagnosis not present

## 2023-06-17 DIAGNOSIS — M79661 Pain in right lower leg: Secondary | ICD-10-CM | POA: Diagnosis not present

## 2023-06-17 DIAGNOSIS — I5030 Unspecified diastolic (congestive) heart failure: Secondary | ICD-10-CM | POA: Diagnosis not present

## 2023-06-17 DIAGNOSIS — S4981XA Other specified injuries of right shoulder and upper arm, initial encounter: Secondary | ICD-10-CM | POA: Diagnosis not present

## 2023-06-17 DIAGNOSIS — I130 Hypertensive heart and chronic kidney disease with heart failure and stage 1 through stage 4 chronic kidney disease, or unspecified chronic kidney disease: Secondary | ICD-10-CM | POA: Insufficient documentation

## 2023-06-17 DIAGNOSIS — D72829 Elevated white blood cell count, unspecified: Secondary | ICD-10-CM | POA: Insufficient documentation

## 2023-06-17 DIAGNOSIS — Z87891 Personal history of nicotine dependence: Secondary | ICD-10-CM | POA: Insufficient documentation

## 2023-06-17 DIAGNOSIS — M79603 Pain in arm, unspecified: Secondary | ICD-10-CM | POA: Diagnosis not present

## 2023-06-17 DIAGNOSIS — S299XXA Unspecified injury of thorax, initial encounter: Secondary | ICD-10-CM | POA: Diagnosis not present

## 2023-06-17 DIAGNOSIS — S42211A Unspecified displaced fracture of surgical neck of right humerus, initial encounter for closed fracture: Secondary | ICD-10-CM | POA: Insufficient documentation

## 2023-06-17 DIAGNOSIS — M79629 Pain in unspecified upper arm: Secondary | ICD-10-CM | POA: Diagnosis not present

## 2023-06-17 DIAGNOSIS — S199XXA Unspecified injury of neck, initial encounter: Secondary | ICD-10-CM | POA: Diagnosis not present

## 2023-06-17 DIAGNOSIS — R601 Generalized edema: Secondary | ICD-10-CM | POA: Diagnosis not present

## 2023-06-17 DIAGNOSIS — Z91040 Latex allergy status: Secondary | ICD-10-CM | POA: Insufficient documentation

## 2023-06-17 DIAGNOSIS — E1122 Type 2 diabetes mellitus with diabetic chronic kidney disease: Secondary | ICD-10-CM | POA: Insufficient documentation

## 2023-06-17 DIAGNOSIS — I6782 Cerebral ischemia: Secondary | ICD-10-CM | POA: Diagnosis not present

## 2023-06-17 DIAGNOSIS — S42351A Displaced comminuted fracture of shaft of humerus, right arm, initial encounter for closed fracture: Secondary | ICD-10-CM | POA: Diagnosis not present

## 2023-06-17 DIAGNOSIS — S0993XA Unspecified injury of face, initial encounter: Secondary | ICD-10-CM | POA: Diagnosis not present

## 2023-06-17 DIAGNOSIS — W19XXXA Unspecified fall, initial encounter: Secondary | ICD-10-CM

## 2023-06-17 DIAGNOSIS — M79641 Pain in right hand: Secondary | ICD-10-CM | POA: Diagnosis not present

## 2023-06-17 DIAGNOSIS — M25519 Pain in unspecified shoulder: Secondary | ICD-10-CM | POA: Diagnosis not present

## 2023-06-17 LAB — COMPREHENSIVE METABOLIC PANEL
ALT: 27 U/L (ref 0–44)
AST: 29 U/L (ref 15–41)
Albumin: 3.3 g/dL — ABNORMAL LOW (ref 3.5–5.0)
Alkaline Phosphatase: 67 U/L (ref 38–126)
Anion gap: 9 (ref 5–15)
BUN: 17 mg/dL (ref 8–23)
CO2: 26 mmol/L (ref 22–32)
Calcium: 9.4 mg/dL (ref 8.9–10.3)
Chloride: 105 mmol/L (ref 98–111)
Creatinine, Ser: 1.43 mg/dL — ABNORMAL HIGH (ref 0.44–1.00)
GFR, Estimated: 37 mL/min — ABNORMAL LOW (ref >60)
Glucose, Bld: 128 mg/dL — ABNORMAL HIGH (ref 70–99)
Potassium: 3.2 mmol/L — ABNORMAL LOW (ref 3.5–5.1)
Sodium: 140 mmol/L (ref 135–145)
Total Bilirubin: 0.6 mg/dL (ref 0.0–1.2)
Total Protein: 7.6 g/dL (ref 6.5–8.1)

## 2023-06-17 LAB — CBC WITH DIFFERENTIAL/PLATELET
Abs Immature Granulocytes: 0.07 10*3/uL (ref 0.00–0.07)
Basophils Absolute: 0.1 10*3/uL (ref 0.0–0.1)
Basophils Relative: 1 %
Eosinophils Absolute: 0.2 10*3/uL (ref 0.0–0.5)
Eosinophils Relative: 2 %
HCT: 40.9 % (ref 36.0–46.0)
Hemoglobin: 13.2 g/dL (ref 12.0–15.0)
Immature Granulocytes: 1 %
Lymphocytes Relative: 8 %
Lymphs Abs: 0.9 10*3/uL (ref 0.7–4.0)
MCH: 28.3 pg (ref 26.0–34.0)
MCHC: 32.3 g/dL (ref 30.0–36.0)
MCV: 87.6 fL (ref 80.0–100.0)
Monocytes Absolute: 0.6 10*3/uL (ref 0.1–1.0)
Monocytes Relative: 5 %
Neutro Abs: 9 10*3/uL — ABNORMAL HIGH (ref 1.7–7.7)
Neutrophils Relative %: 83 %
Platelets: 348 10*3/uL (ref 150–400)
RBC: 4.67 MIL/uL (ref 3.87–5.11)
RDW: 14.3 % (ref 11.5–15.5)
WBC: 10.7 10*3/uL — ABNORMAL HIGH (ref 4.0–10.5)
nRBC: 0 % (ref 0.0–0.2)

## 2023-06-17 MED ORDER — FENTANYL CITRATE (PF) 100 MCG/2ML IJ SOLN
100.0000 ug | Freq: Once | INTRAMUSCULAR | Status: AC
  Administered 2023-06-17: 100 ug via INTRAMUSCULAR
  Filled 2023-06-17: qty 2

## 2023-06-17 MED ORDER — POTASSIUM CHLORIDE CRYS ER 20 MEQ PO TBCR
40.0000 meq | EXTENDED_RELEASE_TABLET | Freq: Once | ORAL | Status: AC
  Administered 2023-06-17: 40 meq via ORAL
  Filled 2023-06-17: qty 2

## 2023-06-17 MED ORDER — LIDOCAINE 5 % EX PTCH: 1.0000 | MEDICATED_PATCH | CUTANEOUS | 0 refills | Status: DC

## 2023-06-17 MED ORDER — OXYCODONE HCL 5 MG PO TABS: 5.0000 mg | ORAL_TABLET | ORAL | 0 refills | Status: DC | PRN

## 2023-06-17 MED ORDER — FENTANYL CITRATE PF 50 MCG/ML IJ SOSY: 50.0000 ug | PREFILLED_SYRINGE | Freq: Once | INTRAMUSCULAR | Status: DC

## 2023-06-17 MED ORDER — ACETAMINOPHEN 500 MG PO TABS: 1000.0000 mg | ORAL_TABLET | Freq: Three times a day (TID) | ORAL | 0 refills | Status: AC

## 2023-06-17 MED ORDER — MAGNESIUM OXIDE -MG SUPPLEMENT 400 (240 MG) MG PO TABS
800.0000 mg | ORAL_TABLET | Freq: Once | ORAL | Status: AC
  Administered 2023-06-17: 800 mg via ORAL
  Filled 2023-06-17: qty 2

## 2023-06-17 NOTE — Discharge Instructions (Addendum)
 For pain:  - Acetaminophen 1000 mg three times daily (every 8 hours) - oxycodone for breakthrough pain only -Lidoderm patches every 12 hours

## 2023-06-17 NOTE — ED Provider Notes (Signed)
 [place] EMERGENCY DEPARTMENT AT [facility] Provider Note  CSN: [id number] Arrival date & time: 06/17/23 1459  Chief Complaint(s) Fall  HPI Kristen Mack is a 78 y.o. female with PMH chronic fatigue syndrome, fibromyalgia, chronic bilateral lower extremity edema, HTN, HLD who presents emergency room for evaluation of a fall.  Patient states that she was in her kitchen and tripped over a trash can falling onto her right side.  Currently endorsing significant pain in the right shoulder.  Denies loss of consciousness.  Denies blood thinner use.  Endorsing pain that extends down the right upper extremity over the humerus, elbow, forearm and hand.  Denies chest pain, shortness of breath, abdominal pain, nausea, vomiting, numbness, tingling, weakness or other systemic or neurologic complaints.   Past Medical History Past Medical History:  Diagnosis Date   ADHD (attention deficit hyperactivity disorder) 2007   Adrenal adenoma, left    followed by dr nida   Arthritis    Asthma    followed by pcp   CFS (chronic fatigue syndrome)    CKD (chronic kidney disease), stage III [facility])    nephrologist---  dr sanford   Depression    Edema of both lower extremities    Fatty liver disease, nonalcoholic    Fibromyalgia    GERD (gastroesophageal reflux disease)    Hiatal hernia    History of adenomatous polyp of colon    History of cardiac murmur    until age 12- pt has had echocardiograms   History of TIA (transient ischemic attack)    per CT scan    Hyperlipidemia    Hypertension    Hypothyroidism    endocrinologist-- dr g. nida   IBS (irritable bowel syndrome)    Meniere disease    OSA on CPAP    per pt uses nightly   Osteopenia    Pancreas cyst    followed by pcp   PMB (postmenopausal bleeding)    PVC's (premature ventricular contractions)    cardiologist--- dr Flora Lipps   Type 2 diabetes mellitus treated with insulin [facility])    endocrinologist-- dr g. nida;     (08-14-2021  per pt checks blood sugar QID ,  fasting average --- 73-150)   Urinary incontinence    Wears glasses    Wears partial dentures    upper   Patient Active Problem List   Diagnosis Date Noted   Urinary frequency 06/01/2023   Heart failure with preserved ejection fraction, unspecified HF chronicity (HCC) 04/26/2023   Moderate episode of recurrent major depressive disorder (HCC) 04/26/2022   Stage 3b chronic kidney disease (HCC) 04/26/2022   DOE (dyspnea on exertion) 01/29/2022   Symptomatic bradycardia 12/11/2021   Hypokalemia 12/11/2021   Type 2 DM with CKD stage 3 and hypertension (HCC) 08/10/2021   Trochanteric bursitis 06/05/2021   History of cystocele 08/04/2020   Urinary incontinence 08/04/2020   Bruising tendency 06/30/2020   Thickened endometrium 03/06/2020   Non-alcoholic fatty liver disease 03/06/2020   Chronic fatigue syndrome 03/04/2020   DDD (degenerative disc disease), cervical 03/04/2020   History of multiple allergies 03/04/2020   Mnire's disease 03/04/2020   Depressive disorder 03/04/2020   Hypertensive disorder 03/04/2020   Diabetes mellitus (HCC) 03/04/2020   Asthma 03/04/2020   Mixed hyperlipidemia 02/13/2020   Diabetes mellitus without complication (HCC) 10/31/2018   OSA on CPAP 07/11/2017   Pancreatic pseudocyst 05/10/2017   Adrenal adenoma 05/10/2017   Senile osteoporosis 11/21/2016   Fibromyalgia 08/10/2016   Female stress  incontinence 11/05/2015   NASH (nonalcoholic steatohepatitis) 04/24/2015   Orthostatic hypotension 09/19/2013   Elevated transaminase level 09/19/2013   HTN (hypertension), benign 09/03/2013   Depression 08/14/2013   Fatty liver 08/14/2013   Other specified hypothyroidism 10/23/2012   Osteopenia 07/08/2012   Morbid obesity (HCC) 07/08/2012   Urinary bladder incontinence 04/30/2012   Family history of rectal cancer 10/04/2011   GERD (gastroesophageal reflux disease) 10/04/2011   IBS (irritable bowel syndrome)  10/04/2011   Home Medication(s) Prior to Admission medications   Medication Sig Start Date End Date Taking? Authorizing Provider  acetaminophen (TYLENOL) 500 MG tablet Take 2 tablets (1,000 mg total) by mouth every 8 (eight) hours. 06/17/23 07/17/23 Yes Deniah Saia, MD  lidocaine (LIDODERM) 5 % Place 1 patch onto the skin daily. Remove & Discard patch within 12 hours or as directed by MD 06/17/23  Yes Efraim Vanallen, MD  oxyCODONE (ROXICODONE) 5 MG immediate release tablet Take 1 tablet (5 mg total) by mouth every 4 (four) hours as needed for breakthrough pain. 06/17/23  Yes Roselani Grajeda, MD  albuterol (PROVENTIL) (2.5 MG/3ML) 0.083% nebulizer solution Take 3 mLs (2.5 mg total) by nebulization every 6 (six) hours as needed for wheezing or shortness of breath. 01/28/22   Nyoka Cowden, MD  albuterol (VENTOLIN HFA) 108 (90 Base) MCG/ACT inhaler Inhale 2 puffs into the lungs every 4 (four) hours as needed. INHALE 2 PUFFS INTO LUNGS EVERY 6 HOURS AS NEEDED FOR SHORTNESS OF BREATH 03/13/23   Particia Nearing, PA-C  Azelastine HCl 137 MCG/SPRAY SOLN USE 2 SPRAY(S) IN EACH NOSTRIL TWICE DAILY 08/27/20   Babs Sciara, MD  B Complex Vitamins (B COMPLEX 100 PO) Take 1 tablet by mouth daily.    [provider]  blood glucose meter kit and supplies Dispense based on patient and insurance preference. Use to test sugars BID  (FOR ICD-10 E10.9, E11.9). 07/17/21   Babs Sciara, MD  buPROPion (WELLBUTRIN XL) 300 MG 24 hr tablet Take 1 tablet (300 mg total) by mouth daily. 02/03/23   Babs Sciara, MD  Chromium Picolinate 1000 MCG TABS Take 1 tablet by mouth daily.    [provider]  Coenzyme Q10 (COQ10) 200 MG CAPS Take 200 mg by mouth daily.    [provider]  Continuous Blood Gluc Receiver (FREESTYLE LIBRE 2 READER) DEVI As directed 08/10/21   Roma Kayser, MD  Continuous Blood Gluc Sensor (FREESTYLE LIBRE 2 SENSOR) MISC 1 Piece by Does not apply route every 14  (fourteen) days. 08/10/21   Roma Kayser, MD  Cyanocobalamin (B-12) 1000 MCG SUBL Place 1,000 mcg under the tongue daily.    [provider]  denosumab (PROLIA) 60 MG/ML SOSY injection INJECT 60 MG SUBCUTANEOUSLY ONCE FOR 1 DOSE 12/07/22   Roma Kayser, MD  DIGESTIVE ENZYMES PO Take 1 tablet by mouth 3 (three) times daily.     [provider]  estradiol (ESTRACE VAGINAL) 0.1 MG/GM vaginal cream 1 applicator intravaginal Sunday and thursday Patient not taking: Reported on 04/26/2023 01/26/23   Babs Sciara, MD  furosemide (LASIX) 20 MG tablet 1 qam prn pedal edema 03/17/23   Babs Sciara, MD  gabapentin (NEURONTIN) 100 MG capsule Take 1 capsule (100 mg total) by mouth 2 (two) times daily. 02/03/23   Babs Sciara, MD  glucose blood test strip Use to check blood glucose four times daily as instructed 09/03/21   Roma Kayser, MD  Insulin Degludec FlexTouch 100  UNIT/ML SOPN INJECT 46 UNITS SUBCUTANEOUSLY ONCE DAILY 06/01/23   Roma Kayser, MD  Insulin Pen Needle (RELION PEN NEEDLES) 31G X 6 MM MISC USE 1 PEN NEEDLE TO INJECT INSULIN ONCE DAILY 01/25/23   Babs Sciara, MD  levothyroxine (SYNTHROID) 125 MCG tablet Take 1 tablet (125 mcg total) by mouth every evening. 02/03/23   Babs Sciara, MD  Lysine HCl 500 MG TABS Take 500 mg by mouth daily.    [provider]  Magnesium 250 MG TABS Take 250 mg by mouth 2 (two) times daily.    [provider]  meclizine (ANTIVERT) 25 MG tablet Take 25 mg by mouth 3 (three) times daily as needed for dizziness.    [provider]  mirabegron ER (MYRBETRIQ) 25 MG TB24 tablet Take 1 tablet (25 mg total) by mouth daily. 06/01/23   McKenzie, Mardene Celeste, MD  montelukast (SINGULAIR) 10 MG tablet TAKE 1 TABLET BY MOUTH AT BEDTIME 04/12/23   Luking, Jonna Coup, MD  OVER THE COUNTER MEDICATION Take 1 Scoop by mouth 3 (three) times daily with meals. Fennel seeds    [provider]  OVER THE  COUNTER MEDICATION Choline and inisotol    [provider]  rosuvastatin (CRESTOR) 5 MG tablet TAKE 1 TABLET BY MOUTH ON MONDAY, WEDNESDAY,  FRIDAY AND SATURDAY 02/03/23   Babs Sciara, MD  spironolactone (ALDACTONE) 25 MG tablet Take 0.5 tablets (12.5 mg total) by mouth daily. 02/03/23   Babs Sciara, MD  TART CHERRY PO Take 2 tablets by mouth 3 (three) times daily.    [provider]  tiZANidine (ZANAFLEX) 2 MG tablet TAKE 1 TABLET BY MOUTH AT BEDTIME AS NEEDED, MAY CAUSE DROWSINESS 02/23/23   Babs Sciara, MD  Vitamin A 3 MG (10000 UT) TABS Take 10,000 Units by mouth daily.    [provider]  vitamin E 180 MG (400 UNITS) capsule Take 800 Units by mouth daily.     [provider]                                                                                                                                    Past Surgical History Past Surgical History:  Procedure Laterality Date   CERVICAL POLYPECTOMY N/A 08/17/2021   Procedure: BIOPSY OF ENDOMETRIUM AND OR POLYPECTOMY;  Surgeon: Essie Hart, MD;  Location: [facility];  Service: Gynecology;  Laterality: N/A;   CESAREAN SECTION  1977  &  1979   CHOLECYSTECTOMY  1995   and  REPAIR UMBILICAL HERNIA   COLONOSCOPY N/A 04/19/2013   Procedure: COLONOSCOPY;  Surgeon: Malissa Hippo, MD;  [facility]: [facility];  Service: Endoscopy;  Laterality: N/A;     DILITATION & CURRETTAGE/HYSTROSCOPY WITH NOVASURE ABLATION N/A 08/17/2021   Procedure: DILATATION & CURETTAGE/HYSTEROSCOPY;  Surgeon: Essie Hart, MD;  Location: [facility];  Service: Gynecology;  Laterality: N/A;  ESOPHAGOGASTRODUODENOSCOPY  02/08/2003   HERNIA REPAIR  1995   HYSTEROSCOPY WITH D & C  11/14/2002   endometrial polypectomy   HYSTEROSCOPY WITH D & C N/A 04/05/2014   Procedure: DILATATION AND CURETTAGE /HYSTEROSCOPY, ENDOMETRIAL POLYPECTOMY;  Surgeon: Essie Hart, MD;  Location: [facility];   Service: Gynecology;  Laterality: N/A;   LIVER BIOPSY  01/29/2004   benign   TUBAL LIGATION  1979   Family History Family History  Problem Relation Age of Onset   Rectal cancer Brother 64   Diabetes Brother    Ovarian cancer Mother 36   Hypertension Mother    Diabetes Mother    Cancer Mother    Depression Mother    Early death Mother    Obesity Mother    Hypertension Father    Diabetes Father    Depression Father    Hyperlipidemia Father    Stroke Father    Breast cancer Maternal Aunt    Breast cancer Paternal Aunt        four Paternal aunts   Heart disease Paternal Grandfather    Hyperlipidemia Paternal Grandfather    Hypertension Paternal Grandfather    Stroke Paternal Grandfather    Liver disease Sister        x 2; fatty liver   Hearing loss Maternal Grandfather    Hypertension Maternal Grandfather    Heart disease Maternal Grandmother    Hypertension Maternal Grandmother    Obesity Maternal Grandmother    Obesity Paternal Grandmother    ADD / ADHD Daughter    Depression Daughter    ADD / ADHD Daughter    Depression Daughter    Alcohol abuse Maternal Uncle    Depression Maternal Uncle    Drug abuse Maternal Uncle    Early death Maternal Uncle    Alcohol abuse Paternal Uncle    Depression Paternal Uncle    Drug abuse Paternal Uncle    Anxiety disorder Sister    Depression Sister    Hyperlipidemia Sister    Miscarriages / Stillbirths Sister    Cancer Brother    Depression Brother    Early death Brother    Cancer Maternal Aunt    Cancer Maternal Aunt    Cancer Paternal Aunt    Cancer Paternal Aunt    Cancer Paternal Aunt    Cancer Paternal Aunt    Drug abuse Brother    Heart disease Maternal Uncle    Hyperlipidemia Maternal Uncle    Hypertension Maternal Uncle    Hyperlipidemia Sister    Hyperlipidemia Brother    Hyperlipidemia Brother    Hypertension Maternal Uncle    Pancreatic cancer Neg Hx    Esophageal cancer Neg Hx     Social  History Social History   Tobacco Use   Smoking status: Former    Current packs/day: 0.00    Average packs/day: 1.5 packs/day for 3.0 years (4.5 ttl pk-yrs)    Types: Cigarettes    Start date: 10/03/1965    Quit date: 10/03/1968    Years since quitting: 54.7    Passive exposure: Past   Smokeless tobacco: Never  Vaping Use   Vaping status: Never Used  Substance Use Topics   Alcohol use: No   Drug use: No   Allergies Latex, Ambien [zolpidem tartrate], Cefzil [cefprozil], Citalopram, Codeine, Demerol [meperidine], Formaldehyde, Sulfa antibiotics, Zolpidem, and Celexa [citalopram hydrobromide]  Review of Systems Review of Systems  Musculoskeletal:  Positive for arthralgias and myalgias.    Physical  Exam Vital Signs  I have reviewed the triage vital signs BP (!) 206/134   Pulse 75   Temp 98.1 F (36.7 C) (Oral)   Resp 18   Ht 4\' 11"  (1.499 m)   Wt 98 kg   SpO2 98%   BMI 43.64 kg/m   Physical Exam Vitals and nursing note reviewed.  Constitutional:      General: She is not in acute distress.    Appearance: She is well-developed.  HENT:     Head: Normocephalic and atraumatic.  Eyes:     Conjunctiva/sclera: Conjunctivae normal.  Cardiovascular:     Rate and Rhythm: Normal rate and regular rhythm.     Heart sounds: No murmur heard. Pulmonary:     Effort: Pulmonary effort is normal. No respiratory distress.     Breath sounds: Normal breath sounds.  Abdominal:     Palpations: Abdomen is soft.     Tenderness: There is no abdominal tenderness.  Musculoskeletal:        General: Tenderness present. No swelling.     Cervical back: Neck supple.  Skin:    General: Skin is warm and dry.     Capillary Refill: Capillary refill takes less than 2 seconds.  Neurological:     Mental Status: She is alert.  Psychiatric:        Mood and Affect: Mood normal.     ED Results and Treatments Labs (all labs ordered are listed, but only abnormal results are displayed) Labs Reviewed   COMPREHENSIVE METABOLIC PANEL - Abnormal; Notable for the following components:      Result Value   Potassium 3.2 (*)    Glucose, Bld 128 (*)    Creatinine, Ser 1.43 (*)    Albumin 3.3 (*)    GFR, Estimated 37 (*)    All other components within normal limits  CBC WITH DIFFERENTIAL/PLATELET - Abnormal; Notable for the following components:   WBC 10.7 (*)    Neutro Abs 9.0 (*)    All other components within normal limits                                                                                                                          Radiology CT Chest Wo Contrast Result Date: 06/17/2023 CLINICAL DATA:  Blunt chest trauma.  Pain. EXAM: CT CHEST WITHOUT CONTRAST TECHNIQUE: Multidetector CT imaging of the chest was performed following the standard protocol without IV contrast. RADIATION DOSE REDUCTION: This exam was performed according to the departmental dose-optimization program which includes automated exposure control, adjustment of the mA and/or kV according to patient size and/or use of iterative reconstruction technique. COMPARISON:  Chest radiograph 03/13/2023 FINDINGS: Cardiovascular: Heart is upper normal in size. There are coronary artery calcifications. Aortic atherosclerosis and tortuosity. There is no periaortic stranding to suggest injury, assessment limited by the lack of IV contrast. Small amount of fluid in the superior pericardial recess. No significant pericardial effusion. Mediastinum/Nodes: No mediastinal hemorrhage. Moderate hiatal hernia.  The esophagus is patulous. No pneumomediastinum. No suspicious thyroid nodule. No enlarged mediastinal lymph nodes. Lungs/Pleura: No pneumothorax or pulmonary contusion. No focal airspace disease. No pleural effusion. Trachea and central airways are clear. Numerous punctate bilateral pulmonary nodules, all 2 mm or less. No larger nodule or pulmonary mass. Upper Abdomen: Small cyst and calcified granuloma in the liver. Post  cholecystectomy. No free fluid in the upper abdomen. Musculoskeletal: Cortical buckling of the right anterior seventh and eighth ribs are suspicious for nondisplaced fractures. No acute fracture of the sternum or included clavicles. The known right proximal humerus fracture is partially included in the field of view. Mild degenerative change in the thoracic spine without acute fracture. No chest wall soft tissue contusion. IMPRESSION: 1. Cortical buckling of the right anterior seventh and eighth ribs are suspicious for nondisplaced fractures. 2. No pneumothorax or pulmonary contusion. 3. Moderate hiatal hernia. 4. Coronary artery calcifications. Aortic Atherosclerosis (ICD10-I70.0). 5. Numerous punctate bilateral pulmonary nodules, all 2 mm or less. Per Fleischner Society Guidelines, no routine follow-up imaging is recommended. These guidelines do not apply to immunocompromised patients and patients with cancer. Follow up in patients with significant comorbidities as clinically warranted. For lung cancer screening, adhere to Lung-RADS guidelines. Reference: Radiology. 2017; 284(1):228-43. Electronically Signed   By: Narda Rutherford M.D.   On: 06/17/2023 17:51   CT HEAD WO CONTRAST ( ) Result Date: 06/17/2023 CLINICAL DATA:  Head trauma, minor (Age >= 65y); Neck trauma (Age >= 65y). Fall, hitting the face, right arm, and ribs. EXAM: CT HEAD WITHOUT CONTRAST CT CERVICAL SPINE WITHOUT CONTRAST TECHNIQUE: Multidetector CT imaging of the head and cervical spine was performed following the standard protocol without intravenous contrast. Multiplanar CT image reconstructions of the cervical spine were also generated. RADIATION DOSE REDUCTION: This exam was performed according to the departmental dose-optimization program which includes automated exposure control, adjustment of the mA and/or kV according to patient size and/or use of iterative reconstruction technique. COMPARISON:  Head CT 04/18/2021 FINDINGS: CT HEAD  FINDINGS Brain: There is no evidence of an acute infarct, intracranial hemorrhage, mass, midline shift, or extra-axial fluid collection. Cerebral volume is within normal limits for age. The ventricles are normal in size. Confluent hypodensities in the cerebral white matter bilaterally have progressed and are nonspecific but compatible with severe chronic small vessel ischemic disease. Vascular: Calcified atherosclerosis at the skull base. No hyperdense vessel. Skull: No acute fracture or suspicious lesion. Sinuses/Orbits: Visualized paranasal sinuses and mastoid air cells are largely clear. Unremarkable orbits. Other: None. CT CERVICAL SPINE FINDINGS Alignment: Mild cervical spine straightening. Mild right convex cervicothoracic spinal curvature. No listhesis. Skull base and vertebrae: No acute fracture or suspicious lesion. Soft tissues and spinal canal: No prevertebral fluid or swelling. No visible canal hematoma. Disc levels: Mild disc degeneration, not greater than expected for age. Facet arthropathy, moderate on the left at C4-5. Capacious cervical spinal canal. Upper chest: Clear lung apices. Other: None. IMPRESSION: 1. No evidence of acute intracranial abnormality or cervical spine fracture. 2. Severe chronic small vessel ischemic disease. Electronically Signed   By: Sebastian Ache M.D.   On: 06/17/2023 16:52   CT CERVICAL SPINE WO CONTRAST Result Date: 06/17/2023 CLINICAL DATA:  Head trauma, minor (Age >= 65y); Neck trauma (Age >= 65y). Fall, hitting the face, right arm, and ribs. EXAM: CT HEAD WITHOUT CONTRAST CT CERVICAL SPINE WITHOUT CONTRAST TECHNIQUE: Multidetector CT imaging of the head and cervical spine was performed following the standard protocol without intravenous contrast. Multiplanar CT image reconstructions of  the cervical spine were also generated. RADIATION DOSE REDUCTION: This exam was performed according to the departmental dose-optimization program which includes automated exposure  control, adjustment of the mA and/or kV according to patient size and/or use of iterative reconstruction technique. COMPARISON:  Head CT 04/18/2021 FINDINGS: CT HEAD FINDINGS Brain: There is no evidence of an acute infarct, intracranial hemorrhage, mass, midline shift, or extra-axial fluid collection. Cerebral volume is within normal limits for age. The ventricles are normal in size. Confluent hypodensities in the cerebral white matter bilaterally have progressed and are nonspecific but compatible with severe chronic small vessel ischemic disease. Vascular: Calcified atherosclerosis at the skull base. No hyperdense vessel. Skull: No acute fracture or suspicious lesion. Sinuses/Orbits: Visualized paranasal sinuses and mastoid air cells are largely clear. Unremarkable orbits. Other: None. CT CERVICAL SPINE FINDINGS Alignment: Mild cervical spine straightening. Mild right convex cervicothoracic spinal curvature. No listhesis. Skull base and vertebrae: No acute fracture or suspicious lesion. Soft tissues and spinal canal: No prevertebral fluid or swelling. No visible canal hematoma. Disc levels: Mild disc degeneration, not greater than expected for age. Facet arthropathy, moderate on the left at C4-5. Capacious cervical spinal canal. Upper chest: Clear lung apices. Other: None. IMPRESSION: 1. No evidence of acute intracranial abnormality or cervical spine fracture. 2. Severe chronic small vessel ischemic disease. Electronically Signed   By: Sebastian Ache M.D.   On: 06/17/2023 16:52   DG Hand Complete Right Result Date: 06/17/2023 CLINICAL DATA:  Pain after fall. EXAM: RIGHT HAND - COMPLETE 3+ VIEW COMPARISON:  None Available. FINDINGS: There is no evidence of fracture or dislocation. The alignment is maintained. There is no evidence of arthropathy or other focal bone abnormality. Soft tissues are unremarkable. IMPRESSION: Negative radiographs of the right hand. Electronically Signed   By: Narda Rutherford M.D.   On:  06/17/2023 16:47   DG Humerus Right Result Date: 06/17/2023 CLINICAL DATA:  Pain after fall EXAM: RIGHT HUMERUS - 2+ VIEW COMPARISON:  None Available. FINDINGS: Comminuted and displaced proximal humerus fracture. The dominant fracture plane is through the surgical neck. There is involvement of the lateral humeral head. No convincing extension to the glenohumeral joint. No glenohumeral dislocation. Elbow alignment is grossly maintained. IMPRESSION: Comminuted and displaced proximal humerus fracture. Electronically Signed   By: Narda Rutherford M.D.   On: 06/17/2023 16:46   DG Tibia/Fibula Right Result Date: 06/17/2023 CLINICAL DATA:  Pain after fall. EXAM: RIGHT TIBIA AND FIBULA - 2 VIEW COMPARISON:  None Available. FINDINGS: There is no evidence of fracture or other focal bone lesions. Cortical margins of the tibia and fibula are intact. Knee and ankle alignment are maintained. There is generalized subcutaneous edema. No soft tissue gas or radiopaque foreign body. IMPRESSION: Generalized subcutaneous edema. No fracture of the right tibia or fibula. Electronically Signed   By: Narda Rutherford M.D.   On: 06/17/2023 16:44    Pertinent labs & imaging results that were available during my care of the patient were reviewed by me and considered in my medical decision making (see MDM for details).  Medications Ordered in ED Medications  fentaNYL (SUBLIMAZE) injection 100 mcg (100 mcg Intramuscular Given 06/17/23 1637)  potassium chloride SA (KLOR-CON M) CR tablet 40 mEq (40 mEq Oral Given 06/17/23 1803)  magnesium oxide (MAG-OX) tablet 800 mg (800 mg Oral Given 06/17/23 1804)  Procedures .Ortho Injury Treatment  Date/Time: 06/17/2023 9:34 PM  Performed by: Glendora Score, MD Authorized by: Glendora Score, MD   Consent:    Consent obtained:  Verbal   Consent given by:   Patient   Risks discussed:  Fracture, nerve damage, restricted joint movement and vascular damage   Alternatives discussed:  No treatment and alternative treatmentInjury location: upper arm Pre-procedure distal perfusion: normal Pre-procedure neurological function: normal Pre-procedure range of motion: reduced Immobilization: sling Splint Applied by: ED Nurse Post-procedure distal perfusion: normal Post-procedure neurological function: normal Post-procedure range of motion: unchanged     (including critical care time)  Medical Decision Making / ED Course   This patient presents to the ED for concern of fall, shoulder pain, this involves an extensive number of treatment options, and is a complaint that carries with it a high risk of complications and morbidity.  The differential diagnosis includes fracture, hematoma, contusion, ligamentous injury, dislocation  MDM: Patient seen emergency room for evaluation of a fall.  Physical exam with tenderness in the right shoulder and along the right lateral chest wall.  Laboratory evaluation with a leukocytosis of 10.7, creatinine 1.43 which is baseline for this patient, potassium 3.2 which was repleted in the ER.  Trauma imaging including CT head, C-spine, chest, x-ray of the hand, humerus and tib-fib on the right with a proximal humerus fracture and 2 nondisplaced rib fractures at 8 and 9 on the right.  Patient pain controlled in the emergency department and a sling and swath placed.  Outpatient referral placed to orthopedics for follow-up of humeral fracture.  Of note, patient is hypertensive in the emergency department and patient states that this is likely a combination of whitecoat hypertension and secondary to pain.  There is no evidence of endorgan damage in the ER today.  Current ER literature advises against aggressive blood pressure control in the emergency department in the absence of symptoms as this is not shown to have clinical benefit and  should be managed in the outpatient setting Archie Balboa et al 2023 Bress et al 2024).  Thus at this time patient discharged with pain medication and outpatient orthopedic follow-up.  Return precautions given of which she voiced understanding    Additional history obtained: -Additional history obtained from daughter -External records from outside source obtained and reviewed including: Chart review including previous notes, labs, imaging, consultation notes   Lab Tests: -I ordered, reviewed, and interpreted labs.   The pertinent results include:   Labs Reviewed  COMPREHENSIVE METABOLIC PANEL - Abnormal; Notable for the following components:      Result Value   Potassium 3.2 (*)    Glucose, Bld 128 (*)    Creatinine, Ser 1.43 (*)    Albumin 3.3 (*)    GFR, Estimated 37 (*)    All other components within normal limits  CBC WITH DIFFERENTIAL/PLATELET - Abnormal; Notable for the following components:   WBC 10.7 (*)    Neutro Abs 9.0 (*)    All other components within normal limits      Imaging Studies ordered: I ordered imaging studies including CT head, C-spine, chest, x-ray hand, humerus, tib-fib I independently visualized and interpreted imaging. I agree with the radiologist interpretation   Medicines ordered and prescription drug management: Meds ordered this encounter  Medications   DISCONTD: fentaNYL (SUBLIMAZE) injection 50 mcg   fentaNYL (SUBLIMAZE) injection 100 mcg   potassium chloride SA (KLOR-CON M) CR tablet 40 mEq   magnesium oxide (MAG-OX) tablet 800 mg  acetaminophen (TYLENOL) 500 MG tablet    Sig: Take 2 tablets (1,000 mg total) by mouth every 8 (eight) hours.    Dispense:  180 tablet    Refill:  0   oxyCODONE (ROXICODONE) 5 MG immediate release tablet    Sig: Take 1 tablet (5 mg total) by mouth every 4 (four) hours as needed for breakthrough pain.    Dispense:  10 tablet    Refill:  0   lidocaine (LIDODERM) 5 %    Sig: Place 1 patch onto the skin daily.  Remove & Discard patch within 12 hours or as directed by MD    Dispense:  30 patch    Refill:  0    -I have reviewed the patients home medicines and have made adjustments as needed  Critical interventions none   Cardiac Monitoring: The patient was maintained on a cardiac monitor.  I personally viewed and interpreted the cardiac monitored which showed an underlying rhythm of: NSR  Social Determinants of Health:  Factors impacting patients care include: none   Reevaluation: After the interventions noted above, I reevaluated the patient and found that they have :improved  Co morbidities that complicate the patient evaluation  Past Medical History:  Diagnosis Date   ADHD (attention deficit hyperactivity disorder) 2007   Adrenal adenoma, left    followed by dr nida   Arthritis    Asthma    followed by pcp   CFS (chronic fatigue syndrome)    CKD (chronic kidney disease), stage III [facility])    nephrologist---  dr sanford   Depression    Edema of both lower extremities    Fatty liver disease, nonalcoholic    Fibromyalgia    GERD (gastroesophageal reflux disease)    Hiatal hernia    History of adenomatous polyp of colon    History of cardiac murmur    until age 10- pt has had echocardiograms   History of TIA (transient ischemic attack)    per CT scan    Hyperlipidemia    Hypertension    Hypothyroidism    endocrinologist-- dr g. nida   IBS (irritable bowel syndrome)    Meniere disease    OSA on CPAP    per pt uses nightly   Osteopenia    Pancreas cyst    followed by pcp   PMB (postmenopausal bleeding)    PVC's (premature ventricular contractions)    cardiologist--- dr Flora Lipps   Type 2 diabetes mellitus treated with insulin [facility])    endocrinologist-- dr g. nida;    (08-14-2021  per pt checks blood sugar QID ,  fasting average --- 73-150)   Urinary incontinence    Wears glasses    Wears partial dentures    upper      Dispostion: I considered admission for this  patient, but at this time she does not meet inpatient criteria for admission and will be discharged with outpatient follow-up     Final Clinical Impression(s) / ED Diagnoses Final diagnoses:  Fall, initial encounter  Closed displaced fracture of surgical neck of right humerus, unspecified fracture morphology, initial encounter  Closed fracture of multiple ribs of right side, initial encounter     @PCDICTATION @    Glendora Score, MD 06/17/23 2137

## 2023-06-17 NOTE — ED Triage Notes (Signed)
 Pt bib [facility] after falling at home. Pt reported she got tangled up in her feet and fell on her right side. Pt reports she hit her face, right arm, and ribs. Pain is in her shoulder, elbow and right thumb. Pt is very hypertensive in triage

## 2023-06-20 ENCOUNTER — Emergency Department (HOSPITAL_COMMUNITY)

## 2023-06-20 ENCOUNTER — Other Ambulatory Visit: Payer: Self-pay | Admitting: Family Medicine

## 2023-06-20 ENCOUNTER — Telehealth: Payer: Self-pay | Admitting: *Deleted

## 2023-06-20 ENCOUNTER — Emergency Department (HOSPITAL_COMMUNITY)
Admission: EM | Admit: 2023-06-20 | Discharge: 2023-06-21 | Disposition: A | Discharge status: Home / Self Care | Attending: Emergency Medicine | Admitting: Emergency Medicine

## 2023-06-20 ENCOUNTER — Other Ambulatory Visit: Payer: Self-pay

## 2023-06-20 ENCOUNTER — Encounter (HOSPITAL_COMMUNITY): Payer: Self-pay | Admitting: Emergency Medicine

## 2023-06-20 DIAGNOSIS — J45909 Unspecified asthma, uncomplicated: Secondary | ICD-10-CM | POA: Diagnosis not present

## 2023-06-20 DIAGNOSIS — I509 Heart failure, unspecified: Secondary | ICD-10-CM | POA: Diagnosis not present

## 2023-06-20 DIAGNOSIS — Z79899 Other long term (current) drug therapy: Secondary | ICD-10-CM | POA: Diagnosis not present

## 2023-06-20 DIAGNOSIS — N189 Chronic kidney disease, unspecified: Secondary | ICD-10-CM | POA: Diagnosis not present

## 2023-06-20 DIAGNOSIS — I517 Cardiomegaly: Secondary | ICD-10-CM | POA: Diagnosis not present

## 2023-06-20 DIAGNOSIS — R079 Chest pain, unspecified: Secondary | ICD-10-CM | POA: Diagnosis present

## 2023-06-20 DIAGNOSIS — R0781 Pleurodynia: Secondary | ICD-10-CM | POA: Diagnosis not present

## 2023-06-20 DIAGNOSIS — R071 Chest pain on breathing: Secondary | ICD-10-CM | POA: Diagnosis not present

## 2023-06-20 DIAGNOSIS — Z91040 Latex allergy status: Secondary | ICD-10-CM | POA: Insufficient documentation

## 2023-06-20 DIAGNOSIS — W19XXXA Unspecified fall, initial encounter: Secondary | ICD-10-CM | POA: Diagnosis not present

## 2023-06-20 DIAGNOSIS — I10 Essential (primary) hypertension: Secondary | ICD-10-CM | POA: Diagnosis not present

## 2023-06-20 DIAGNOSIS — R0789 Other chest pain: Secondary | ICD-10-CM | POA: Insufficient documentation

## 2023-06-20 DIAGNOSIS — I130 Hypertensive heart and chronic kidney disease with heart failure and stage 1 through stage 4 chronic kidney disease, or unspecified chronic kidney disease: Secondary | ICD-10-CM | POA: Insufficient documentation

## 2023-06-20 DIAGNOSIS — K449 Diaphragmatic hernia without obstruction or gangrene: Secondary | ICD-10-CM | POA: Diagnosis not present

## 2023-06-20 LAB — CBC WITH DIFFERENTIAL/PLATELET
Abs Immature Granulocytes: 0.03 10*3/uL (ref 0.00–0.07)
Basophils Absolute: 0.1 10*3/uL (ref 0.0–0.1)
Basophils Relative: 1 %
Eosinophils Absolute: 0.4 10*3/uL (ref 0.0–0.5)
Eosinophils Relative: 4 %
HCT: 38.6 % (ref 36.0–46.0)
Hemoglobin: 12.3 g/dL (ref 12.0–15.0)
Immature Granulocytes: 0 %
Lymphocytes Relative: 16 %
Lymphs Abs: 1.5 10*3/uL (ref 0.7–4.0)
MCH: 28.3 pg (ref 26.0–34.0)
MCHC: 31.9 g/dL (ref 30.0–36.0)
MCV: 88.9 fL (ref 80.0–100.0)
Monocytes Absolute: 0.9 10*3/uL (ref 0.1–1.0)
Monocytes Relative: 9 %
Neutro Abs: 6.6 10*3/uL (ref 1.7–7.7)
Neutrophils Relative %: 70 %
Platelets: 323 10*3/uL (ref 150–400)
RBC: 4.34 MIL/uL (ref 3.87–5.11)
RDW: 14.3 % (ref 11.5–15.5)
WBC: 9.5 10*3/uL (ref 4.0–10.5)
nRBC: 0 % (ref 0.0–0.2)

## 2023-06-20 LAB — COMPREHENSIVE METABOLIC PANEL
ALT: 20 U/L (ref 0–44)
AST: 18 U/L (ref 15–41)
Albumin: 3 g/dL — ABNORMAL LOW (ref 3.5–5.0)
Alkaline Phosphatase: 59 U/L (ref 38–126)
Anion gap: 9 (ref 5–15)
BUN: 34 mg/dL — ABNORMAL HIGH (ref 8–23)
CO2: 24 mmol/L (ref 22–32)
Calcium: 9.2 mg/dL (ref 8.9–10.3)
Chloride: 102 mmol/L (ref 98–111)
Creatinine, Ser: 1.57 mg/dL — ABNORMAL HIGH (ref 0.44–1.00)
GFR, Estimated: 33 mL/min — ABNORMAL LOW (ref >60)
Glucose, Bld: 140 mg/dL — ABNORMAL HIGH (ref 70–99)
Potassium: 3.9 mmol/L (ref 3.5–5.1)
Sodium: 135 mmol/L (ref 135–145)
Total Bilirubin: 0.4 mg/dL (ref 0.0–1.2)
Total Protein: 7.2 g/dL (ref 6.5–8.1)

## 2023-06-20 LAB — LIPASE, BLOOD: Lipase: 30 U/L (ref 11–51)

## 2023-06-20 LAB — MAGNESIUM: Magnesium: 2.1 mg/dL (ref 1.7–2.4)

## 2023-06-20 LAB — TROPONIN I (HIGH SENSITIVITY): Troponin I (High Sensitivity): 15 ng/L (ref <18)

## 2023-06-20 MED ORDER — OXYCODONE HCL 5 MG PO TABS
5.0000 mg | ORAL_TABLET | Freq: Once | ORAL | Status: AC
  Administered 2023-06-20: 5 mg via ORAL
  Filled 2023-06-20: qty 1

## 2023-06-20 MED ORDER — METHOCARBAMOL 500 MG PO TABS
500.0000 mg | ORAL_TABLET | Freq: Once | ORAL | Status: AC
  Administered 2023-06-20: 500 mg via ORAL
  Filled 2023-06-20: qty 1

## 2023-06-20 MED ORDER — OXYCODONE HCL 5 MG PO TABS: 5.0000 mg | ORAL_TABLET | ORAL | 0 refills | Status: DC | PRN

## 2023-06-20 NOTE — ED Triage Notes (Signed)
 Pt c/o bilateral rib pain since fall Friday.

## 2023-06-20 NOTE — Telephone Encounter (Signed)
 Copied from CRM [id number]. Topic: Clinical - Medication Refill >> Jun 20, 2023  7:45 AM Kristen Mack wrote: Most Recent Primary Care Visit:  Provider: Lilyan Punt A  Department: [facility]  Visit Type: OFFICE VISIT  Date: 04/26/2023  Medication: oxyCODONE (ROXICODONE) 5 MG immediate release tablet  Has the patient contacted their pharmacy? No (Agent: If no, request that the patient contact the pharmacy for the refill. If patient does not wish to contact the pharmacy document the reason why and proceed with request.) (Agent: If yes, when and what did the pharmacy advise?)  Is this the correct pharmacy for this prescription? Yes - [facility] If no, delete pharmacy and type the correct one.  This is the patient's preferred pharmacy:  [facility] [address], [place] - [address] [place] [address] [place] [address] Phone: [phone] Fax: [phone]   Has the prescription been filled recently? No  Is the patient out of the medication? Yes She is taking last one today  Has the patient been seen for an appointment in the last year OR does the patient have an upcoming appointment? Yes - Daughter is Social research officer, government today to make an appointment. See chart notes.    Can we respond through MyChart? No - Please call daughter with any questions at [phone]  Agent: Please be advised that Rx refills may take up to 3 business days. We ask that you follow-up with your pharmacy. >> Jun 20, 2023  2:55 PM Ja-Kwan M wrote: Patient daughter Kristen Mack stated patient has taken the last pill that she had and really needs the provider to contact her if more information is needed to refill the Rx for oxyCODONE (ROXICODONE) 5 MG immediate release tablet.  Per Kristen Mack patient has broken bones. Call back# [phone]

## 2023-06-20 NOTE — Telephone Encounter (Signed)
 Copied from CRM [id number]. Topic: Clinical - Medication Refill >> Jun 20, 2023  7:45 AM Clayton Bibles wrote: Most Recent Primary Care Visit:  Provider: Lilyan Punt A  Department: [facility]  Visit Type: OFFICE VISIT  Date: 04/26/2023  Medication: oxyCODONE (ROXICODONE) 5 MG immediate release tablet  Has the patient contacted their pharmacy? No (Agent: If no, request that the patient contact the pharmacy for the refill. If patient does not wish to contact the pharmacy document the reason why and proceed with request.) (Agent: If yes, when and what did the pharmacy advise?)  Is this the correct pharmacy for this prescription? Yes - [facility] If no, delete pharmacy and type the correct one.  This is the patient's preferred pharmacy:  [facility] [address], [place] - [address] [place] [address] [place] [address] Phone: [phone] Fax: [phone]   Has the prescription been filled recently? No  Is the patient out of the medication? Yes She is taking last one today  Has the patient been seen for an appointment in the last year OR does the patient have an upcoming appointment? Yes - Daughter is Social research officer, government today to make an appointment. See chart notes.    Can we respond through MyChart? No - Please call daughter with any questions at [phone]  Agent: Please be advised that Rx refills may take up to 3 business days. We ask that you follow-up with your pharmacy.

## 2023-06-20 NOTE — Telephone Encounter (Unsigned)
 Copied from CRM [id number]. Topic: Clinical - Medication Refill >> Jun 20, 2023  7:45 AM Clayton Bibles wrote: Most Recent Primary Care Visit:  Provider: Lilyan Punt A  Department: [facility]  Visit Type: OFFICE VISIT  Date: 04/26/2023  Medication: oxyCODONE (ROXICODONE) 5 MG immediate release tablet  Has the patient contacted their pharmacy? No (Agent: If no, request that the patient contact the pharmacy for the refill. If patient does not wish to contact the pharmacy document the reason why and proceed with request.) (Agent: If yes, when and what did the pharmacy advise?)  Is this the correct pharmacy for this prescription? Yes - [facility] If no, delete pharmacy and type the correct one.  This is the patient's preferred pharmacy:  [facility] [address], [place] - [address] [place] [address] [place] [address] Phone: [phone] Fax: [phone]   Has the prescription been filled recently? No  Is the patient out of the medication? Yes She is taking last one today  Has the patient been seen for an appointment in the last year OR does the patient have an upcoming appointment? Yes - Daughter is Social research officer, government today to make an appointment. See chart notes.    Can we respond through MyChart? No - Please call daughter with any questions at [phone]  Agent: Please be advised that Rx refills may take up to 3 business days. We ask that you follow-up with your pharmacy.

## 2023-06-20 NOTE — ED Notes (Signed)
 Patient transported to X-ray

## 2023-06-20 NOTE — ED Provider Notes (Signed)
 [place] EMERGENCY DEPARTMENT AT [facility] Provider Note   CSN: [id number] Arrival date & time: 06/20/23  2053     History  Chief Complaint  Patient presents with   Chest Pain    Kristen Mack is a 78 y.o. female.  HPI Patient presents for chest pain.  Medical history includes CKD, asthma, arthritis, chronic fatigue, GERD, fibromyalgia, HLD, HTN, IBS, OSA, CHF.  She was seen in the ED 3 days ago following a mechanical fall.  She was found to have right proximal humerus fracture as well as to nondisplaced rib fractures.  Sling was placed.  She was prescribed Roxicodone and lidocaine patches.  Patient has been taking scheduled Tylenol and every 6 as needed Roxicodone.  She has ongoing soreness to right arm and right chest which has not worsened.  She has developed worsening pain in area under left breast.  Pain is worsened with movements and palpation.  She denies any associated symptoms.    Home Medications Prior to Admission medications   Medication Sig Start Date End Date Taking? Authorizing Provider  acetaminophen (TYLENOL) 500 MG tablet Take 2 tablets (1,000 mg total) by mouth every 8 (eight) hours. 06/17/23 07/17/23  Kommor, Madison, MD  albuterol (PROVENTIL) (2.5 MG/3ML) 0.083% nebulizer solution Take 3 mLs (2.5 mg total) by nebulization every 6 (six) hours as needed for wheezing or shortness of breath. 01/28/22   Nyoka Cowden, MD  albuterol (VENTOLIN HFA) 108 (90 Base) MCG/ACT inhaler Inhale 2 puffs into the lungs every 4 (four) hours as needed. INHALE 2 PUFFS INTO LUNGS EVERY 6 HOURS AS NEEDED FOR SHORTNESS OF BREATH 03/13/23   Particia Nearing, PA-C  Azelastine HCl 137 MCG/SPRAY SOLN USE 2 SPRAY(S) IN EACH NOSTRIL TWICE DAILY 08/27/20   Babs Sciara, MD  B Complex Vitamins (B COMPLEX 100 PO) Take 1 tablet by mouth daily.    [provider]  blood glucose meter kit and supplies Dispense based on patient and insurance preference. Use to test  sugars BID  (FOR ICD-10 E10.9, E11.9). 07/17/21   Babs Sciara, MD  buPROPion (WELLBUTRIN XL) 300 MG 24 hr tablet Take 1 tablet (300 mg total) by mouth daily. 02/03/23   Babs Sciara, MD  Chromium Picolinate 1000 MCG TABS Take 1 tablet by mouth daily.    [provider]  Coenzyme Q10 (COQ10) 200 MG CAPS Take 200 mg by mouth daily.    [provider]  Continuous Blood Gluc Receiver (FREESTYLE LIBRE 2 READER) DEVI As directed 08/10/21   Roma Kayser, MD  Continuous Blood Gluc Sensor (FREESTYLE LIBRE 2 SENSOR) MISC 1 Piece by Does not apply route every 14 (fourteen) days. 08/10/21   Roma Kayser, MD  Cyanocobalamin (B-12) 1000 MCG SUBL Place 1,000 mcg under the tongue daily.    [provider]  denosumab (PROLIA) 60 MG/ML SOSY injection INJECT 60 MG SUBCUTANEOUSLY ONCE FOR 1 DOSE 12/07/22   Roma Kayser, MD  DIGESTIVE ENZYMES PO Take 1 tablet by mouth 3 (three) times daily.     [provider]  estradiol (ESTRACE VAGINAL) 0.1 MG/GM vaginal cream 1 applicator intravaginal Sunday and thursday Patient not taking: Reported on 04/26/2023 01/26/23   Babs Sciara, MD  furosemide (LASIX) 20 MG tablet 1 qam prn pedal edema 03/17/23   Babs Sciara, MD  gabapentin (NEURONTIN) 100 MG capsule Take 1 capsule (100 mg total) by mouth 2 (two) times daily. 02/03/23   Babs Sciara,  MD  glucose blood test strip Use to check blood glucose four times daily as instructed 09/03/21   Roma Kayser, MD  Insulin Degludec FlexTouch 100 UNIT/ML SOPN INJECT 46 UNITS SUBCUTANEOUSLY ONCE DAILY 06/01/23   Nida, Denman George, MD  Insulin Pen Needle (RELION PEN NEEDLES) 31G X 6 MM MISC USE 1 PEN NEEDLE TO INJECT INSULIN ONCE DAILY 01/25/23   Babs Sciara, MD  levothyroxine (SYNTHROID) 125 MCG tablet Take 1 tablet (125 mcg total) by mouth every evening. 02/03/23   Babs Sciara, MD  lidocaine (LIDODERM) 5 % Place 1 patch onto the skin daily. Remove &  Discard patch within 12 hours or as directed by MD 06/17/23   Kommor, Wyn Forster, MD  Lysine HCl 500 MG TABS Take 500 mg by mouth daily.    [provider]  Magnesium 250 MG TABS Take 250 mg by mouth 2 (two) times daily.    [provider]  meclizine (ANTIVERT) 25 MG tablet Take 25 mg by mouth 3 (three) times daily as needed for dizziness.    [provider]  mirabegron ER (MYRBETRIQ) 25 MG TB24 tablet Take 1 tablet (25 mg total) by mouth daily. 06/01/23   McKenzie, Mardene Celeste, MD  montelukast (SINGULAIR) 10 MG tablet TAKE 1 TABLET BY MOUTH AT BEDTIME 04/12/23   Luking, Jonna Coup, MD  OVER THE COUNTER MEDICATION Take 1 Scoop by mouth 3 (three) times daily with meals. Fennel seeds    [provider]  OVER THE COUNTER MEDICATION Choline and inisotol    [provider]  oxyCODONE (ROXICODONE) 5 MG immediate release tablet Take 1 tablet (5 mg total) by mouth every 4 (four) hours as needed for breakthrough pain. 06/20/23   Babs Sciara, MD  rosuvastatin (CRESTOR) 5 MG tablet TAKE 1 TABLET BY MOUTH ON MONDAY, WEDNESDAY,  FRIDAY AND SATURDAY 02/03/23   Babs Sciara, MD  spironolactone (ALDACTONE) 25 MG tablet Take 0.5 tablets (12.5 mg total) by mouth daily. 02/03/23   Babs Sciara, MD  TART CHERRY PO Take 2 tablets by mouth 3 (three) times daily.    [provider]  tiZANidine (ZANAFLEX) 2 MG tablet TAKE 1 TABLET BY MOUTH AT BEDTIME AS NEEDED, MAY CAUSE DROWSINESS 02/23/23   Babs Sciara, MD  Vitamin A 3 MG (10000 UT) TABS Take 10,000 Units by mouth daily.    [provider]  vitamin E 180 MG (400 UNITS) capsule Take 800 Units by mouth daily.     [provider]      Allergies    Latex, Ambien [zolpidem tartrate], Cefzil [cefprozil], Citalopram, Codeine, Demerol [meperidine], Formaldehyde, Sulfa antibiotics, Zolpidem, and Celexa [citalopram hydrobromide]    Review of Systems   Review of Systems  Cardiovascular:  Positive for chest  pain.  All other systems reviewed and are negative.   Physical Exam Updated Vital Signs BP (!) 169/88   Pulse 73   Temp 97.9 F (36.6 C) (Oral)   Ht 4\' 11"  (1.499 m)   Wt 98 kg   SpO2 95%   BMI 43.64 kg/m  Physical Exam Vitals and nursing note reviewed.  Constitutional:      General: She is not in acute distress.    Appearance: She is well-developed. She is not ill-appearing, toxic-appearing or diaphoretic.  HENT:     Head: Normocephalic and atraumatic.  Eyes:     Conjunctiva/sclera: Conjunctivae normal.  Cardiovascular:     Rate and Rhythm: Normal rate and regular rhythm.  Heart sounds: No murmur heard. Pulmonary:     Effort: Pulmonary effort is normal. No respiratory distress.     Breath sounds: Normal breath sounds.  Chest:     Chest wall: Tenderness present.  Abdominal:     Palpations: Abdomen is soft.     Tenderness: There is no abdominal tenderness.  Musculoskeletal:        General: No swelling.     Cervical back: Normal range of motion and neck supple.  Skin:    General: Skin is warm and dry.     Coloration: Skin is not cyanotic or pale.  Neurological:     General: No focal deficit present.     Mental Status: She is alert and oriented to person, place, and time.  Psychiatric:        Mood and Affect: Mood normal.        Behavior: Behavior normal.     ED Results / Procedures / Treatments   Labs (all labs ordered are listed, but only abnormal results are displayed) Labs Reviewed  CBC WITH DIFFERENTIAL/PLATELET  MAGNESIUM  COMPREHENSIVE METABOLIC PANEL  LIPASE, BLOOD  TROPONIN I (HIGH SENSITIVITY)  TROPONIN I (HIGH SENSITIVITY)    EKG EKG Interpretation Date/Time:  Monday June 20 2023 21:57:27 EDT Ventricular Rate:  70 PR Interval:  178 QRS Duration:  97 QT Interval:  423 QTC Calculation: 457 R Axis:   -18  Text Interpretation: Sinus rhythm Abnormal R-wave progression, early transition LVH with secondary repolarization abnormality  Confirmed by Gloris Manchester [phone]) on 06/20/2023 10:55:47 PM  Radiology DG Chest 2 View Result Date: 06/20/2023 CLINICAL DATA:  Left-sided chest pain on inspiration, initial encounter EXAM: CHEST - 2 VIEW COMPARISON:  03/13/2023 FINDINGS: Cardiac shadow is enlarged but stable. The lungs are clear. Known hiatal hernia is again seen and stable. No bony abnormality is noted. IMPRESSION: No active cardiopulmonary disease. Electronically Signed   By: Alcide Clever M.D.   On: 06/20/2023 22:07    Procedures Procedures    Medications Ordered in ED Medications  methocarbamol (ROBAXIN) tablet 500 mg (500 mg Oral Given 06/20/23 2212)  oxyCODONE (Oxy IR/ROXICODONE) immediate release tablet 5 mg (5 mg Oral Given 06/20/23 2212)    ED Course/ Medical Decision Making/ A&P                                 Medical Decision Making Amount and/or Complexity of Data Reviewed Labs: ordered. Radiology: ordered.  Risk Prescription drug management.   This patient presents to the ED for concern of chest pain, this involves an extensive number of treatment options, and is a complaint that carries with it a high risk of complications and morbidity.  The differential diagnosis includes occult injury, ACS, pneumonia   Co morbidities that complicate the patient evaluation  CKD, asthma, arthritis, chronic fatigue, GERD, fibromyalgia, HLD, HTN, IBS, OSA, CHF   Additional history obtained:  Additional history obtained from patient's daughter External records from outside source obtained and reviewed including EMR   Lab Tests:  I Ordered, and personally interpreted labs.  The pertinent results include: No leukocytosis on CBC.  Remaining lab work pending at time of signout.   Imaging Studies ordered:  I ordered imaging studies including chest x-ray I independently visualized and interpreted imaging which showed no acute findings I agree with the radiologist interpretation   Cardiac Monitoring: /  EKG:  The patient was maintained on a cardiac monitor.  I personally viewed and interpreted the cardiac monitored which showed an underlying rhythm of: Sinus rhythm   Problem List / ED Course / Critical interventions / Medication management  Patient presenting for left-sided chest pain.  She had a mechanical fall 3 days ago and was diagnosed with a proximal right humerus fracture and right-sided rib fractures.  She states that she has had some left-sided chest pain since the fall but this has recently worsened.  She denies any worsening to the right sided injuries.  On arrival in the ED, patient is alert and oriented.  She is accompanied by family at bedside.  Family has charted her home pain medications, which have included Tylenol, Roxicodone, and lidocaine patches.  On exam, patient has exquisite tenderness to area of left lower chest.  Roxicodone and Robaxin ordered for analgesia.  Workup was initiated.  On reassessment, pain improved.  Care of patient signed out to oncoming ED provider.. I ordered medication including Percocet and Robaxin for analgesia Reevaluation of the patient after these medicines showed that the patient improved I have reviewed the patients home medicines and have made adjustments as needed   Social Determinants of Health:  Has PCP        Final Clinical Impression(s) / ED Diagnoses Final diagnoses:  Chest pain, unspecified type    Rx / DC Orders ED Discharge Orders     None         Gloris Manchester, MD 06/20/23 2315

## 2023-06-21 ENCOUNTER — Ambulatory Visit: Admitting: "Endocrinology

## 2023-06-21 MED ORDER — OXYCODONE-ACETAMINOPHEN 5-325 MG PO TABS: 1.0000 | ORAL_TABLET | ORAL | 0 refills | Status: AC | PRN

## 2023-06-21 NOTE — Telephone Encounter (Signed)
 Medication was sent last evening FYI she did go to the hospital last night because of increased pain

## 2023-06-21 NOTE — ED Provider Notes (Signed)
 Signed out to me by Dr. Durwin Nora.  Patient with left-sided chest pain.  Patient had a fall 3 days ago with right-sided arm and chest injury.  Shortly after she was seen in the ED for that fall she started to have left-sided chest pain.  This has been persistent for several days now.  The area is tender to the touch and pain worsens with movement of the chest.  EKG, troponin negative.  Do not feel that she requires a second troponin as she has had continuous pain for more than 2 days, a second troponin will not be helpful.  This is clearly chest wall pain.  She has run out of her oxycodone.    Gilda Crease, MD 06/21/23 [phone]

## 2023-06-22 ENCOUNTER — Ambulatory Visit: Admitting: Orthopedic Surgery

## 2023-06-22 DIAGNOSIS — S42294A Other nondisplaced fracture of upper end of right humerus, initial encounter for closed fracture: Secondary | ICD-10-CM

## 2023-06-22 MED FILL — Oxycodone w/ Acetaminophen Tab 5-325 MG: ORAL | Qty: 6 | Status: AC

## 2023-06-22 NOTE — Progress Notes (Signed)
 Patient ID: Kristen Mack, female   DOB: Sep 27, 1945, 78 y.o.   MRN: [id number]  ASSESSMENT AND PLAN :  78 year old female with proximal humerus fracture right upper extremity who has fracture morphology that meets criteria for nonoperative treatment.  She also has several comorbidities which prevent surgical intervention.  Recommend x-ray next week and then if stable fracture can start physical therapy    Chief Complaint  Patient presents with   Arm Injury    DOI 06/17/23 fell Humerus fracture    HPI Kristen Mack is a 78 y.o. female.  Presents for evaluation of proximal humerus fracture diagnosed in the emergency room on 21 March 78 year old female status post fall injured right shoulder on 21 March.  Presented to the ER for evaluation and management where images showed she had a proximal humerus fracture  She was placed in a sling and swath  She was also noted to have rib injuries on the right and left which she was told included bruised ribs on the left and fractured ribs on the right  Presents with pain in the right upper extremity.  She was put on oxycodone and Tylenol    Arm Injury     Review of Systems Review of Systems  Reason unable to perform ROS: Multiple systems with positive findings including respiratory shortness of breath musculoskeletal joint pain neurologic positive endocrine positive.     Past Medical History:  Diagnosis Date   ADHD (attention deficit hyperactivity disorder) 2007   Adrenal adenoma, left    followed by dr nida   Arthritis    Asthma    followed by pcp   CFS (chronic fatigue syndrome)    CKD (chronic kidney disease), stage III [facility])    nephrologist---  dr Marisue Humble   Depression    Edema of both lower extremities    Fatty liver disease, nonalcoholic    Fibromyalgia    GERD (gastroesophageal reflux disease)    Hiatal hernia    History of adenomatous polyp of colon    History of cardiac murmur    until age 57- pt has  had echocardiograms   History of TIA (transient ischemic attack)    per CT scan    Hyperlipidemia    Hypertension    Hypothyroidism    endocrinologist-- dr g. nida   IBS (irritable bowel syndrome)    Meniere disease    OSA on CPAP    per pt uses nightly   Osteopenia    Pancreas cyst    followed by pcp   PMB (postmenopausal bleeding)    PVC's (premature ventricular contractions)    cardiologist--- dr Flora Lipps   Type 2 diabetes mellitus treated with insulin [facility])    endocrinologist-- dr g. nida;    (08-14-2021  per pt checks blood sugar QID ,  fasting average --- 73-150)   Urinary incontinence    Wears glasses    Wears partial dentures    upper    Past Surgical History:  Procedure Laterality Date   CERVICAL POLYPECTOMY N/A 08/17/2021   Procedure: BIOPSY OF ENDOMETRIUM AND OR POLYPECTOMY;  Surgeon: Essie Hart, MD;  Location: [facility];  Service: Gynecology;  Laterality: N/A;   CESAREAN SECTION  1977  &  1979   CHOLECYSTECTOMY  1995   and  REPAIR UMBILICAL HERNIA   COLONOSCOPY N/A 04/19/2013   Procedure: COLONOSCOPY;  Surgeon: Malissa Hippo, MD;  [facility];  Service: Endoscopy;  Laterality: N/A;  DILITATION & CURRETTAGE/HYSTROSCOPY WITH NOVASURE ABLATION N/A 08/17/2021   Procedure: DILATATION & CURETTAGE/HYSTEROSCOPY;  Surgeon: Essie Hart, MD;  Location[facility] ;  Service: Gynecology;  Laterality: N/A;   ESOPHAGOGASTRODUODENOSCOPY  02/08/2003   HERNIA REPAIR  1995   HYSTEROSCOPY WITH D & C  11/14/2002   endometrial polypectomy   HYSTEROSCOPY WITH D & C N/A 04/05/2014   Procedure: DILATATION AND CURETTAGE /HYSTEROSCOPY, ENDOMETRIAL POLYPECTOMY;  Surgeon: Essie Hart, MD;  Location: [facility];  Service: Gynecology;  Laterality: N/A;   LIVER BIOPSY  01/29/2004   benign   TUBAL LIGATION  1979    Family History  Problem Relation Age of Onset   Rectal cancer Brother 52   Diabetes Brother    Ovarian cancer  Mother 35   Hypertension Mother    Diabetes Mother    Cancer Mother    Depression Mother    Early death Mother    Obesity Mother    Hypertension Father    Diabetes Father    Depression Father    Hyperlipidemia Father    Stroke Father    Breast cancer Maternal Aunt    Breast cancer Paternal Aunt        four Paternal aunts   Heart disease Paternal Grandfather    Hyperlipidemia Paternal Grandfather    Hypertension Paternal Grandfather    Stroke Paternal Grandfather    Liver disease Sister        x 2; fatty liver   Hearing loss Maternal Grandfather    Hypertension Maternal Grandfather    Heart disease Maternal Grandmother    Hypertension Maternal Grandmother    Obesity Maternal Grandmother    Obesity Paternal Grandmother    ADD / ADHD Daughter    Depression Daughter    ADD / ADHD Daughter    Depression Daughter    Alcohol abuse Maternal Uncle    Depression Maternal Uncle    Drug abuse Maternal Uncle    Early death Maternal Uncle    Alcohol abuse Paternal Uncle    Depression Paternal Uncle    Drug abuse Paternal Uncle    Anxiety disorder Sister    Depression Sister    Hyperlipidemia Sister    Miscarriages / Stillbirths Sister    Cancer Brother    Depression Brother    Early death Brother    Cancer Maternal Aunt    Cancer Maternal Aunt    Cancer Paternal Aunt    Cancer Paternal Aunt    Cancer Paternal Aunt    Cancer Paternal Aunt    Drug abuse Brother    Heart disease Maternal Uncle    Hyperlipidemia Maternal Uncle    Hypertension Maternal Uncle    Hyperlipidemia Sister    Hyperlipidemia Brother    Hyperlipidemia Brother    Hypertension Maternal Uncle    Pancreatic cancer Neg Hx    Esophageal cancer Neg Hx     Social History Social History   Tobacco Use   Smoking status: Former    Current packs/day: 0.00    Average packs/day: 1.5 packs/day for 3.0 years (4.5 ttl pk-yrs)    Types: Cigarettes    Start date: 10/03/1965    Quit date: 10/03/1968    Years  since quitting: 54.7    Passive exposure: Past   Smokeless tobacco: Never  Vaping Use   Vaping status: Never Used  Substance Use Topics   Alcohol use: No   Drug use: No    Allergies  Allergen Reactions  Latex Other (See Comments) and Itching    "skin gets raw"   Ambien [Zolpidem Tartrate] Other (See Comments)    Side effect "lost a couple days"; memory loss   Cefzil [Cefprozil] Diarrhea   Citalopram     Other reaction(s): Not available   Codeine Itching and Nausea And Vomiting   Demerol [Meperidine] Other (See Comments)    "messes up senses"   Formaldehyde    Sulfa Antibiotics Other (See Comments)    Severe abd cramp   Zolpidem     Other reaction(s): Not available   Celexa [Citalopram Hydrobromide] Other (See Comments)    Mouth sores    Current Outpatient Medications  Medication Sig Dispense Refill   acetaminophen (TYLENOL) 500 MG tablet Take 2 tablets (1,000 mg total) by mouth every 8 (eight) hours. 180 tablet 0   albuterol (PROVENTIL) (2.5 MG/3ML) 0.083% nebulizer solution Take 3 mLs (2.5 mg total) by nebulization every 6 (six) hours as needed for wheezing or shortness of breath. 75 mL 12   albuterol (VENTOLIN HFA) 108 (90 Base) MCG/ACT inhaler Inhale 2 puffs into the lungs every 4 (four) hours as needed. INHALE 2 PUFFS INTO LUNGS EVERY 6 HOURS AS NEEDED FOR SHORTNESS OF BREATH 18 g 0   Azelastine HCl 137 MCG/SPRAY SOLN USE 2 SPRAY(S) IN EACH NOSTRIL TWICE DAILY 30 mL 5   B Complex Vitamins (B COMPLEX 100 PO) Take 1 tablet by mouth daily.     blood glucose meter kit and supplies Dispense based on patient and insurance preference. Use to test sugars BID  (FOR ICD-10 E10.9, E11.9). 1 each 0   buPROPion (WELLBUTRIN XL) 300 MG 24 hr tablet Take 1 tablet (300 mg total) by mouth daily. 90 tablet 1   Chromium Picolinate 1000 MCG TABS Take 1 tablet by mouth daily.     Coenzyme Q10 (COQ10) 200 MG CAPS Take 200 mg by mouth daily.     Continuous Blood Gluc Receiver (FREESTYLE LIBRE  2 READER) DEVI As directed 1 each 0   Continuous Blood Gluc Sensor (FREESTYLE LIBRE 2 SENSOR) MISC 1 Piece by Does not apply route every 14 (fourteen) days. 2 each 3   Cyanocobalamin (B-12) 1000 MCG SUBL Place 1,000 mcg under the tongue daily.     denosumab (PROLIA) 60 MG/ML SOSY injection INJECT 60 MG SUBCUTANEOUSLY ONCE FOR 1 DOSE 1 mL 1   DIGESTIVE ENZYMES PO Take 1 tablet by mouth 3 (three) times daily.      estradiol (ESTRACE VAGINAL) 0.1 MG/GM vaginal cream 1 applicator intravaginal Sunday and thursday 42.5 g 6   furosemide (LASIX) 20 MG tablet 1 qam prn pedal edema 30 tablet 3   gabapentin (NEURONTIN) 100 MG capsule Take 1 capsule (100 mg total) by mouth 2 (two) times daily. 60 capsule 5   glucose blood test strip Use to check blood glucose four times daily as instructed 100 each 2   Insulin Degludec FlexTouch 100 UNIT/ML SOPN INJECT 46 UNITS SUBCUTANEOUSLY ONCE DAILY 15 mL 0   Insulin Pen Needle (RELION PEN NEEDLES) 31G X 6 MM MISC USE 1 PEN NEEDLE TO INJECT INSULIN ONCE DAILY 100 each 1   levothyroxine (SYNTHROID) 125 MCG tablet Take 1 tablet (125 mcg total) by mouth every evening. 90 tablet 0   lidocaine (LIDODERM) 5 % Place 1 patch onto the skin daily. Remove & Discard patch within 12 hours or as directed by MD 30 patch 0   Lysine HCl 500 MG TABS Take 500 mg by mouth daily.  Magnesium 250 MG TABS Take 250 mg by mouth 2 (two) times daily.     meclizine (ANTIVERT) 25 MG tablet Take 25 mg by mouth 3 (three) times daily as needed for dizziness.     mirabegron ER (MYRBETRIQ) 25 MG TB24 tablet Take 1 tablet (25 mg total) by mouth daily. 30 tablet 11   montelukast (SINGULAIR) 10 MG tablet TAKE 1 TABLET BY MOUTH AT BEDTIME 90 tablet 0   OVER THE COUNTER MEDICATION Take 1 Scoop by mouth 3 (three) times daily with meals. Fennel seeds     OVER THE COUNTER MEDICATION Choline and inisotol     oxyCODONE (ROXICODONE) 5 MG immediate release tablet Take 1 tablet (5 mg total) by mouth every 4 (four)  hours as needed for breakthrough pain. 20 tablet 0   oxyCODONE-acetaminophen (PERCOCET/ROXICET) 5-325 MG tablet Take 1 tablet by mouth every 4 (four) hours as needed for severe pain (pain score 7-10). 6 tablet 0   rosuvastatin (CRESTOR) 5 MG tablet TAKE 1 TABLET BY MOUTH ON MONDAY, WEDNESDAY,  FRIDAY AND SATURDAY 18 tablet 5   spironolactone (ALDACTONE) 25 MG tablet Take 0.5 tablets (12.5 mg total) by mouth daily. 30 tablet 3   TART CHERRY PO Take 2 tablets by mouth 3 (three) times daily.     tiZANidine (ZANAFLEX) 2 MG tablet TAKE 1 TABLET BY MOUTH AT BEDTIME AS NEEDED, MAY CAUSE DROWSINESS 20 tablet 0   Vitamin A 3 MG (10000 UT) TABS Take 10,000 Units by mouth daily.     vitamin E 180 MG (400 UNITS) capsule Take 800 Units by mouth daily.      No current facility-administered medications for this visit.   Facility-Administered Medications Ordered in Other Visits  Medication Dose Route Frequency Provider Last Rate Last Admin   gentamicin (GARAMYCIN) 330 mg in dextrose 5 % 100 mL IVPB  330 mg Intravenous Q24H Norva Pavlov, RPH   330 mg at 08/17/21 1442       Physical Exam  The patient no obvious developmental abnormalities Orientation to person place and time is normal  Mood is pleasant.  Affect is normal  Ambulatory status presented in a wheelchair Cervical spine exam is as follows no tenderness  Right shoulder  Examination: Inspection of the shoulder shows that there is ecchymosis of the skin proximal humerus down into the upper arm. Tenderness at the proximal humerus and fracture site is noted.  Range of motion examination is deferred because of pain.  Motor exam hand wrist elbow normal including normal muscle tone.  Shoulder stability tests deferred because of fracture, elbow stable wrist stable.  Neurovascular examination is intact and the lymph nodes in the axilla and supraclavicular regions are normal   The opposite shoulder has no swelling, normal range of motion, no  joint contracture subluxation atrophy tremor or skin lesion. Neurovascular exam is intact.   MEDICAL DECISION MAKING  A.  Encounter Diagnosis  Name Primary?   Other closed nondisplaced fracture of proximal end of right humerus, initial encounter Yes    B. DATA ANALYSED:  Emergency room report was reviewed injured ribs noted  IMAGING: Independent interpretation of images: Comminuted proximal humerus fracture meets near criteria for nonoperative treatment with no fragment greater than 45 degrees angulated or a 1 cm displaced  Orders: No new orders  Outside records reviewed: Emergency room record  C. MANAGEMENT nonoperative care  X-ray in a week.  Presumably should be able to start therapy  Patient not an operative candidate by fracture  criteria or medical criteria  No orders of the defined types were placed in this encounter.

## 2023-06-22 NOTE — Progress Notes (Signed)
  Intake history:  There were no vitals taken for this visit. There is no height or weight on file to calculate BMI.    WHAT ARE WE SEEING YOU FOR TODAY?   right arm(s)  How Kristen Mack has this bothered you? (DOI?DOS?WS?)  approximately DOI 06/17/23 day(s) ago  Anticoag.  No  Diabetes Yes  Heart disease No  Hypertension Yes  SMOKING HX No  Kidney disease Yes  Any ALLERGIES ______________________________________________   Treatment:  Have you taken:  Tylenol Yes  Advil No  Had PT No  Had injection No  Other  _________________________

## 2023-06-23 ENCOUNTER — Encounter: Payer: Self-pay | Admitting: Family Medicine

## 2023-06-23 NOTE — Addendum Note (Signed)
 Addended by: Vickki Hearing on: 06/23/2023 08:55 AM   Modules accepted: Level of Service

## 2023-06-25 ENCOUNTER — Other Ambulatory Visit: Payer: Self-pay | Admitting: Family Medicine

## 2023-06-28 ENCOUNTER — Ambulatory Visit (INDEPENDENT_AMBULATORY_CARE_PROVIDER_SITE_OTHER): Admitting: "Endocrinology

## 2023-06-28 ENCOUNTER — Encounter: Payer: Self-pay | Admitting: "Endocrinology

## 2023-06-28 ENCOUNTER — Encounter: Payer: Self-pay | Admitting: Orthopedic Surgery

## 2023-06-28 VITALS — BP 150/98 | HR 72

## 2023-06-28 DIAGNOSIS — E782 Mixed hyperlipidemia: Secondary | ICD-10-CM | POA: Diagnosis not present

## 2023-06-28 DIAGNOSIS — E119 Type 2 diabetes mellitus without complications: Secondary | ICD-10-CM

## 2023-06-28 DIAGNOSIS — M810 Age-related osteoporosis without current pathological fracture: Secondary | ICD-10-CM

## 2023-06-28 DIAGNOSIS — Z794 Long term (current) use of insulin: Secondary | ICD-10-CM | POA: Diagnosis not present

## 2023-06-28 DIAGNOSIS — E038 Other specified hypothyroidism: Secondary | ICD-10-CM | POA: Diagnosis not present

## 2023-06-28 DIAGNOSIS — G8929 Other chronic pain: Secondary | ICD-10-CM

## 2023-06-28 DIAGNOSIS — S42294A Other nondisplaced fracture of upper end of right humerus, initial encounter for closed fracture: Secondary | ICD-10-CM

## 2023-06-28 LAB — POCT GLYCOSYLATED HEMOGLOBIN (HGB A1C): HbA1c, POC (controlled diabetic range): 6.2 % (ref 0.0–7.0)

## 2023-06-28 MED ORDER — FREESTYLE LIBRE 3 PLUS SENSOR MISC: 2 refills | Status: AC

## 2023-06-28 MED ORDER — SPIRONOLACTONE 25 MG PO TABS: 25.0000 mg | ORAL_TABLET | Freq: Every day | ORAL | 3 refills | Status: DC

## 2023-06-28 MED ORDER — FREESTYLE LIBRE 3 READER DEVI: 1.0000 | Freq: Once | 0 refills | Status: AC | PRN

## 2023-06-28 MED ORDER — DENOSUMAB 60 MG/ML ~~LOC~~ SOSY
60.0000 mg | PREFILLED_SYRINGE | Freq: Once | SUBCUTANEOUS | Status: AC
Start: 2023-06-28 — End: 2023-06-28
  Administered 2023-06-28: 60 mg via SUBCUTANEOUS

## 2023-06-28 NOTE — Progress Notes (Signed)
 Pt seen in office for follow up with Dr.Nida and for Prolia injection. Pt provided Prolia 60mg , Lot # [id number], Exp. Date 10/26/2025. Prolia 60mg  given SQ in upper L arm without difficulty. Pt waited 15 minutes post injection with no adverse reactions noted.

## 2023-06-28 NOTE — Progress Notes (Signed)
 06/28/2023, 6:46 PM     Endocrinology follow-up note   Subjective:    Patient ID: Kristen Mack, female    DOB: 1946-02-12, PCP Babs Sciara, MD   Past Medical History:  Diagnosis Date   ADHD (attention deficit hyperactivity disorder) 2007   Adrenal adenoma, left    followed by dr Vontrell Pullman   Arthritis    Asthma    followed by pcp   CFS (chronic fatigue syndrome)    CKD (chronic kidney disease), stage III [facility])    nephrologist---  dr Marisue Humble   Depression    Edema of both lower extremities    Fatty liver disease, nonalcoholic    Fibromyalgia    GERD (gastroesophageal reflux disease)    Hiatal hernia    History of adenomatous polyp of colon    History of cardiac murmur    until age 23- pt has had echocardiograms   History of TIA (transient ischemic attack)    per CT scan    Hyperlipidemia    Hypertension    Hypothyroidism    endocrinologist-- dr g. Nardos Putnam   IBS (irritable bowel syndrome)    Meniere disease    OSA on CPAP    per pt uses nightly   Osteopenia    Pancreas cyst    followed by pcp   PMB (postmenopausal bleeding)    PVC's (premature ventricular contractions)    cardiologist--- dr Flora Lipps   Type 2 diabetes mellitus treated with insulin [facility])    endocrinologist-- dr g. Bentleigh Stankus;    (08-14-2021  per pt checks blood sugar QID ,  fasting average --- 73-150)   Urinary incontinence    Wears glasses    Wears partial dentures    upper   Past Surgical History:  Procedure Laterality Date   CERVICAL POLYPECTOMY N/A 08/17/2021   Procedure: BIOPSY OF ENDOMETRIUM AND OR POLYPECTOMY;  Surgeon: Essie Hart, MD;  Location: [facility];  Service: Gynecology;  Laterality: N/A;   CESAREAN SECTION  1977  &  1979   CHOLECYSTECTOMY  1995   and  REPAIR UMBILICAL HERNIA   COLONOSCOPY N/A 04/19/2013   Procedure: COLONOSCOPY;  Surgeon: Malissa Hippo, MD;  [facility];  Service: Endoscopy;   Laterality: N/A;     DILITATION & CURRETTAGE/HYSTROSCOPY WITH NOVASURE ABLATION N/A 08/17/2021   Procedure: DILATATION & CURETTAGE/HYSTEROSCOPY;  Surgeon: Essie Hart, MD;  Location: [facility];  Service: Gynecology;  Laterality: N/A;   ESOPHAGOGASTRODUODENOSCOPY  02/08/2003   HERNIA REPAIR  1995   HYSTEROSCOPY WITH D & C  11/14/2002   endometrial polypectomy   HYSTEROSCOPY WITH D & C N/A 04/05/2014   Procedure: DILATATION AND CURETTAGE /HYSTEROSCOPY, ENDOMETRIAL POLYPECTOMY;  Surgeon: Essie Hart, MD;  Location:  SURGERY CENTER;  Service: Gynecology;  Laterality: N/A;   LIVER BIOPSY  01/29/2004   benign   TUBAL LIGATION  1979   Social History   Socioeconomic History   Marital status: Married    Spouse name: Kristen Mack   Number of children: 2   Years of education: Not on file   Highest education level: Associate degree: occupational, Scientist, product/process development, or vocational program  Occupational History   Occupation: retired, Charity fundraiser  Tobacco  Use   Smoking status: Former    Current packs/day: 0.00    Average packs/day: 1.5 packs/day for 3.0 years (4.5 ttl pk-yrs)    Types: Cigarettes    Start date: 10/03/1965    Quit date: 10/03/1968    Years since quitting: 54.7    Passive exposure: Past   Smokeless tobacco: Never  Vaping Use   Vaping status: Never Used  Substance and Sexual Activity   Alcohol use: No   Drug use: No   Sexual activity: Not Currently    Birth control/protection: None  Other Topics Concern   Not on file  Social History Narrative   2 daughters, Kristen Mack and Kristen Mack. Kristen Mack lives with parents. Kristen Mack lives in [place].   Social Drivers of Corporate investment banker Strain: Low Risk  (04/24/2023)   Overall Financial Resource Strain (CARDIA)    Difficulty of Paying Living Expenses: Not very hard  Recent Concern: Financial Resource Strain - Medium Risk (01/25/2023)   Overall Financial Resource Strain (CARDIA)    Difficulty of Paying Living Expenses: Somewhat  hard  Food Insecurity: Food Insecurity Present (04/24/2023)   Hunger Vital Sign    Worried About Running Out of Food in the Last Year: Sometimes true    Ran Out of Food in the Last Year: Never true  Transportation Needs: No Transportation Needs (04/24/2023)   PRAPARE - Administrator, Civil Service (Medical): No    Lack of Transportation (Non-Medical): No  Physical Activity: Unknown (04/24/2023)   Exercise Vital Sign    Days of Exercise per Week: 0 days    Minutes of Exercise per Session: Patient declined  Stress: No Stress Concern Present (04/24/2023)   [employer] of Occupational Health - Occupational Stress Questionnaire    Feeling of Stress : Not at all  Social Connections: Moderately Isolated (04/24/2023)   Social Connection and Isolation Panel [NHANES]    Frequency of Communication with Friends and Family: More than three times a week    Frequency of Social Gatherings with Friends and Family: Once a week    Attends Religious Services: Never    Database administrator or Organizations: No    Attends Banker Meetings: Never    Marital Status: Married   Outpatient Encounter Medications as of 06/28/2023  Medication Sig   Continuous Glucose Receiver (FREESTYLE LIBRE 3 READER) DEVI 1 Piece by Does not apply route once as needed for up to 1 dose.   Continuous Glucose Sensor (FREESTYLE LIBRE 3 PLUS SENSOR) MISC Change sensor every 15 days.   acetaminophen (TYLENOL) 500 MG tablet Take 2 tablets (1,000 mg total) by mouth every 8 (eight) hours.   albuterol (PROVENTIL) (2.5 MG/3ML) 0.083% nebulizer solution Take 3 mLs (2.5 mg total) by nebulization every 6 (six) hours as needed for wheezing or shortness of breath.   albuterol (VENTOLIN HFA) 108 (90 Base) MCG/ACT inhaler Inhale 2 puffs into the lungs every 4 (four) hours as needed. INHALE 2 PUFFS INTO LUNGS EVERY 6 HOURS AS NEEDED FOR SHORTNESS OF BREATH   Azelastine HCl 137 MCG/SPRAY SOLN USE 2 SPRAY(S) IN EACH NOSTRIL  TWICE DAILY   B Complex Vitamins (B COMPLEX 100 PO) Take 1 tablet by mouth daily.   blood glucose meter kit and supplies Dispense based on patient and insurance preference. Use to test sugars BID  (FOR ICD-10 E10.9, E11.9).   buPROPion (WELLBUTRIN XL) 300 MG 24 hr tablet Take 1 tablet by mouth once daily   Chromium Picolinate  1000 MCG TABS Take 1 tablet by mouth daily.   Coenzyme Q10 (COQ10) 200 MG CAPS Take 200 mg by mouth daily.   Cyanocobalamin (B-12) 1000 MCG SUBL Place 1,000 mcg under the tongue daily.   denosumab (PROLIA) 60 MG/ML SOSY injection INJECT 60 MG SUBCUTANEOUSLY ONCE FOR 1 DOSE   DIGESTIVE ENZYMES PO Take 1 tablet by mouth 3 (three) times daily.    furosemide (LASIX) 20 MG tablet 1 qam prn pedal edema   gabapentin (NEURONTIN) 100 MG capsule Take 1 capsule (100 mg total) by mouth 2 (two) times daily.   glucose blood test strip Use to check blood glucose four times daily as instructed   Insulin Degludec FlexTouch 100 UNIT/ML SOPN INJECT 46 UNITS SUBCUTANEOUSLY ONCE DAILY (Patient taking differently: at bedtime.)   Insulin Pen Needle (RELION PEN NEEDLES) 31G X 6 MM MISC USE 1 PEN NEEDLE TO INJECT INSULIN ONCE DAILY   levothyroxine (SYNTHROID) 125 MCG tablet Take 1 tablet (125 mcg total) by mouth every evening.   lidocaine (LIDODERM) 5 % Place 1 patch onto the skin daily. Remove & Discard patch within 12 hours or as directed by MD   Lysine HCl 500 MG TABS Take 500 mg by mouth daily.   Magnesium 250 MG TABS Take 250 mg by mouth 2 (two) times daily.   meclizine (ANTIVERT) 25 MG tablet Take 25 mg by mouth 3 (three) times daily as needed for dizziness.   mirabegron ER (MYRBETRIQ) 25 MG TB24 tablet Take 1 tablet (25 mg total) by mouth daily.   montelukast (SINGULAIR) 10 MG tablet TAKE 1 TABLET BY MOUTH AT BEDTIME   OVER THE COUNTER MEDICATION Take 1 Scoop by mouth 3 (three) times daily with meals. Fennel seeds   OVER THE COUNTER MEDICATION Choline and inisotol   oxyCODONE (ROXICODONE)  5 MG immediate release tablet Take 1 tablet (5 mg total) by mouth every 4 (four) hours as needed for breakthrough pain.   oxyCODONE-acetaminophen (PERCOCET/ROXICET) 5-325 MG tablet Take 1 tablet by mouth every 4 (four) hours as needed for severe pain (pain score 7-10).   rosuvastatin (CRESTOR) 5 MG tablet TAKE 1 TABLET BY MOUTH ON MONDAY, WEDNESDAY,  FRIDAY AND SATURDAY   spironolactone (ALDACTONE) 25 MG tablet Take 1 tablet (25 mg total) by mouth daily.   TART CHERRY PO Take 2 tablets by mouth 3 (three) times daily.   tiZANidine (ZANAFLEX) 2 MG tablet TAKE 1 TABLET BY MOUTH AT BEDTIME AS NEEDED, MAY CAUSE DROWSINESS   Vitamin A 3 MG (10000 UT) TABS Take 10,000 Units by mouth daily.   vitamin E 180 MG (400 UNITS) capsule Take 800 Units by mouth daily.    [DISCONTINUED] Continuous Blood Gluc Receiver (FREESTYLE LIBRE 2 READER) DEVI As directed   [DISCONTINUED] Continuous Blood Gluc Sensor (FREESTYLE LIBRE 2 SENSOR) MISC 1 Piece by Does not apply route every 14 (fourteen) days.   [DISCONTINUED] estradiol (ESTRACE VAGINAL) 0.1 MG/GM vaginal cream 1 applicator intravaginal Sunday and thursday   [DISCONTINUED] spironolactone (ALDACTONE) 25 MG tablet Take 0.5 tablets (12.5 mg total) by mouth daily.   Facility-Administered Encounter Medications as of 06/28/2023  Medication   [COMPLETED] denosumab (PROLIA) injection 60 mg   gentamicin (GARAMYCIN) 330 mg in dextrose 5 % 100 mL IVPB   ALLERGIES: Allergies  Allergen Reactions   Latex Other (See Comments) and Itching    "skin gets raw"   Ambien [Zolpidem Tartrate] Other (See Comments)    Side effect "lost a couple days"; memory loss   Cefzil [  Cefprozil] Diarrhea   Citalopram     Other reaction(s): Not available   Codeine Itching and Nausea And Vomiting   Demerol [Meperidine] Other (See Comments)    "messes up senses"   Formaldehyde    Sulfa Antibiotics Other (See Comments)    Severe abd cramp   Zolpidem     Other reaction(s): Not available    Celexa [Citalopram Hydrobromide] Other (See Comments)    Mouth sores    VACCINATION STATUS: Immunization History  Administered Date(s) Administered   Fluad Quad(high Dose 65+) 01/22/2020, 02/10/2021, 01/22/2022   Fluad Trivalent(High Dose 65+) 01/26/2023   Influenza, High Dose Seasonal PF 04/15/2018, 01/13/2019   Influenza,inj,Quad PF,6+ Mos 03/02/2016, 03/03/2017   Influenza-Unspecified 04/15/2018, 01/15/2019, 01/23/2020   Moderna Sars-Covid-2 Vaccination 05/23/2019, 06/19/2019, 03/05/2020   Pneumococcal Conjugate-13 05/22/2014   Pneumococcal Polysaccharide-23 03/03/2017   Tdap 04/18/2020   Zoster Recombinant(Shingrix) 12/08/2018, 04/27/2019    HPI Kristen Mack is 78 y.o. female who is accompanied by her daughter to clinic.  She usually follows in this clinic for stable, nonfunctioning 1.5 cm left adrenal adenoma which was previously worked up completely.  She also has hypothyroidism, osteoporosis, type 2 diabetes followed in this clinic.  She is currently on Tresiba 46 units nightly.  She wears a freestyle libre CGM averaging 155 mg per DL in the last 2 weeks.  Her AGP shows 79% in range, 21% Level One hyperglycemia.  Her point-of-care A1c is 6.2% progressively improving.   She has no hypoglycemia.  She remains on levothyroxine 125 mcg p.o. daily before breakfast.   -  Her previsit thyroid function tests are consistent with appropriate replacement.    She has no new complaints today. -In April 2022,  she underwent surveillance MRI abdomen/pelvis which documented stable adrenal finding.  This study was done to follow-up pancreatic cyst which was also reported to be stable and favoring benign findings.   -Her  previous 24-hour urine function studies for adrenal function have been within normal limits .  -She denies history of difficult to control hypertension, in fact has history of orthostatic hypotension.  She denies spells of palpitations, headaches, sweating. -She denies  close  family history of pituitary, adrenal dysfunctions.  -Regarding her osteoporosis, she is on ongoing Prolia injection every 6 months.  She is due for her next treatment today.   She tolerated this medication very well.  Review of Systems  Limited as above.  Objective:    BP (!) 150/98   Pulse 72   Wt Readings from Last 3 Encounters:  06/20/23 216 lb 0.8 oz (98 kg)  06/17/23 216 lb 0.8 oz (98 kg)  04/26/23 217 lb 12.8 oz (98.8 kg)    Recent Results (from the past 2160 hours)  BLADDER SCAN AMB NON-IMAGING     Status: None   Collection Time: 04/06/23  4:08 PM  Result Value Ref Range   Scan Result 0   Urinalysis, Routine w reflex microscopic     Status: Abnormal   Collection Time: 04/06/23  4:15 PM  Result Value Ref Range   Specific Gravity, UA 1.025 1.005 - 1.030   pH, UA 6.0 5.0 - 7.5   Color, UA Yellow Yellow   Appearance Ur Clear Clear   Leukocytes,UA 2+ (A) Negative   Protein,UA 2+ (A) Negative/Trace   Glucose, UA Negative Negative   Ketones, UA Negative Negative   RBC, UA 1+ (A) Negative   Bilirubin, UA Negative Negative   Urobilinogen, Ur 1.0 0.2 - 1.0 mg/dL  Nitrite, UA Negative Negative   Microscopic Examination See below:     Comment: Microscopic was indicated and was performed.  Microscopic Examination     Status: Abnormal   Collection Time: 04/06/23  4:15 PM   Urine  Result Value Ref Range   WBC, UA >30 (A) 0 - 5 /hpf   RBC, Urine 3-10 (A) 0 - 2 /hpf   Epithelial Cells (non renal) 0-10 0 - 10 /hpf   Bacteria, UA Moderate (A) None seen/Few  ECHOCARDIOGRAM COMPLETE     Status: None   Collection Time: 04/14/23  4:15 PM  Result Value Ref Range   AR max vel 0.97 cm2   AV Area VTI 1.00 cm2   AV Mean grad 7.7 mmHg   AV Peak grad 14.8 mmHg   Ao pk vel 1.92 m/s   AV Area mean vel 1.01 cm2   Area-P 1/2 4.21 cm2   S' Lateral 2.90 cm   Est EF 55 - 60%   CBC with Differential/Platelet     Status: Abnormal   Collection Time: 04/20/23  4:40 PM  Result  Value Ref Range   WBC 9.3 3.4 - 10.8 x10E3/uL   RBC 4.74 3.77 - 5.28 x10E6/uL   Hemoglobin 13.0 11.1 - 15.9 g/dL   Hematocrit 86.5 78.4 - 46.6 %   MCV 87 79 - 97 fL   MCH 27.4 26.6 - 33.0 pg   MCHC 31.6 31.5 - 35.7 g/dL   RDW 69.6 29.5 - 28.4 %   Platelets 415 150 - 450 x10E3/uL   Neutrophils 68 Not Estab. %   Lymphs 18 Not Estab. %   Monocytes 7 Not Estab. %   Eos 5 Not Estab. %   Basos 1 Not Estab. %   Neutrophils Absolute 6.4 1.4 - 7.0 x10E3/uL   Lymphocytes Absolute 1.6 0.7 - 3.1 x10E3/uL   Monocytes Absolute 0.6 0.1 - 0.9 x10E3/uL   EOS (ABSOLUTE) 0.5 (H) 0.0 - 0.4 x10E3/uL   Basophils Absolute 0.1 0.0 - 0.2 x10E3/uL   Immature Granulocytes 1 Not Estab. %   Immature Grans (Abs) 0.1 0.0 - 0.1 x10E3/uL  Comprehensive metabolic panel     Status: Abnormal   Collection Time: 06/17/23  4:39 PM  Result Value Ref Range   Sodium 140 135 - 145 mmol/L   Potassium 3.2 (L) 3.5 - 5.1 mmol/L   Chloride 105 98 - 111 mmol/L   CO2 26 22 - 32 mmol/L   Glucose, Bld 128 (H) 70 - 99 mg/dL    Comment: Glucose reference range applies only to samples taken after fasting for at least 8 hours.   BUN 17 8 - 23 mg/dL   Creatinine, Ser 1.32 (H) 0.44 - 1.00 mg/dL   Calcium 9.4 8.9 - 44.0 mg/dL   Total Protein 7.6 6.5 - 8.1 g/dL   Albumin 3.3 (L) 3.5 - 5.0 g/dL   AST 29 15 - 41 U/L   ALT 27 0 - 44 U/L   Alkaline Phosphatase 67 38 - 126 U/L   Total Bilirubin 0.6 0.0 - 1.2 mg/dL   GFR, Estimated 37 (L) >60 mL/min    Comment: (NOTE) Calculated using the CKD-EPI Creatinine Equation (2021)    Anion gap 9 5 - 15    Comment: Performed at [facility], [address] [place], [place] [address]  CBC with Differential     Status: Abnormal   Collection Time: 06/17/23  4:39 PM  Result Value Ref Range   WBC 10.7 (H)  4.0 - 10.5 K/uL   RBC 4.67 3.87 - 5.11 MIL/uL   Hemoglobin 13.2 12.0 - 15.0 g/dL   HCT 30.8 65.7 - 84.6 %   MCV 87.6 80.0 - 100.0 fL   MCH 28.3 26.0 - 34.0 pg   MCHC 32.3 30.0 - 36.0 g/dL    RDW 96.2 95.2 - 84.1 %   Platelets 348 150 - 400 K/uL   nRBC 0.0 0.0 - 0.2 %   Neutrophils Relative % 83 %   Neutro Abs 9.0 (H) 1.7 - 7.7 K/uL   Lymphocytes Relative 8 %   Lymphs Abs 0.9 0.7 - 4.0 K/uL   Monocytes Relative 5 %   Monocytes Absolute 0.6 0.1 - 1.0 K/uL   Eosinophils Relative 2 %   Eosinophils Absolute 0.2 0.0 - 0.5 K/uL   Basophils Relative 1 %   Basophils Absolute 0.1 0.0 - 0.1 K/uL   Immature Granulocytes 1 %   Abs Immature Granulocytes 0.07 0.00 - 0.07 K/uL    Comment: Performed at [facility], [address], [place], [place] [address]  Troponin I (High Sensitivity)     Status: None   Collection Time: 06/20/23 10:42 PM  Result Value Ref Range   Troponin I (High Sensitivity) 15 <18 ng/L    Comment: (NOTE) Elevated high sensitivity troponin I (hsTnI) values and significant  changes across serial measurements may suggest ACS but many other  chronic and acute conditions are known to elevate hsTnI results.  Refer to the "Links" section for chest pain algorithms and additional  guidance. Performed at [facility], [address], [place], [place] [address]   Magnesium     Status: None   Collection Time: 06/20/23 10:42 PM  Result Value Ref Range   Magnesium 2.1 1.7 - 2.4 mg/dL    Comment: Performed at [facility], [address] [place], [place] [address]  Comprehensive metabolic panel     Status: Abnormal   Collection Time: 06/20/23 10:42 PM  Result Value Ref Range   Sodium 135 135 - 145 mmol/L   Potassium 3.9 3.5 - 5.1 mmol/L   Chloride 102 98 - 111 mmol/L   CO2 24 22 - 32 mmol/L   Glucose, Bld 140 (H) 70 - 99 mg/dL    Comment: Glucose reference range applies only to samples taken after fasting for at least 8 hours.   BUN 34 (H) 8 - 23 mg/dL   Creatinine, Ser 4.03 (H) 0.44 - 1.00 mg/dL   Calcium 9.2 8.9 - 47.4 mg/dL   Total Protein 7.2 6.5 - 8.1 g/dL   Albumin 3.0 (L) 3.5 - 5.0 g/dL   AST 18 15 - 41 U/L   ALT 20 0 - 44 U/L   Alkaline Phosphatase 59 38  - 126 U/L   Total Bilirubin 0.4 0.0 - 1.2 mg/dL   GFR, Estimated 33 (L) >60 mL/min    Comment: (NOTE) Calculated using the CKD-EPI Creatinine Equation (2021)    Anion gap 9 5 - 15    Comment: Performed at [facility], [address], [place], [place] [address]  Lipase, blood     Status: None   Collection Time: 06/20/23 10:42 PM  Result Value Ref Range   Lipase 30 11 - 51 U/L    Comment: Performed at [facility], [address], [place], [place] [address]  CBC with Differential/Platelet     Status: None   Collection Time: 06/20/23 10:42 PM  Result Value Ref Range   WBC 9.5 4.0 -  10.5 K/uL   RBC 4.34 3.87 - 5.11 MIL/uL   Hemoglobin 12.3 12.0 - 15.0 g/dL   HCT 60.4 54.0 - 98.1 %   MCV 88.9 80.0 - 100.0 fL   MCH 28.3 26.0 - 34.0 pg   MCHC 31.9 30.0 - 36.0 g/dL   RDW 19.1 47.8 - 29.5 %   Platelets 323 150 - 400 K/uL   nRBC 0.0 0.0 - 0.2 %   Neutrophils Relative % 70 %   Neutro Abs 6.6 1.7 - 7.7 K/uL   Lymphocytes Relative 16 %   Lymphs Abs 1.5 0.7 - 4.0 K/uL   Monocytes Relative 9 %   Monocytes Absolute 0.9 0.1 - 1.0 K/uL   Eosinophils Relative 4 %   Eosinophils Absolute 0.4 0.0 - 0.5 K/uL   Basophils Relative 1 %   Basophils Absolute 0.1 0.0 - 0.1 K/uL   Immature Granulocytes 0 %   Abs Immature Granulocytes 0.03 0.00 - 0.07 K/uL    Comment: Performed at [facility], [address] [place], [place] [address]  HgB A1c     Status: None   Collection Time: 06/28/23  4:12 PM  Result Value Ref Range   Hemoglobin A1C     HbA1c POC (<> result, manual entry)     HbA1c, POC (prediabetic range)     HbA1c, POC (controlled diabetic range) 6.2 0.0 - 7.0 %    Diabetic Labs (most recent): Lab Results  Component Value Date   HGBA1C 6.2 06/28/2023   HGBA1C 7.1 (A) 12/14/2022   HGBA1C 6.7 08/31/2022   MICROALBUR 0.4 05/07/2014   MICROALBUR 1.74 08/09/2013     Lipid Panel ( most recent) Lipid Panel     Component Value Date/Time   CHOL 134 08/24/2022 1645   TRIG 198 (H)  08/24/2022 1645   HDL 42 08/24/2022 1645   CHOLHDL 3.2 08/24/2022 1645   CHOLHDL 3.3 05/07/2014 1122   VLDL 24 05/07/2014 1122   LDLCALC 59 08/24/2022 1645          MRI of abdomen on May 04, 2017:  Normal right adrenal gland. Left adrenal nodule on the order of 1.5 cm is consistent with an adenoma.   Surveillance abdominal MRI from December 25, 2019 IMPRESSION: 1. Motion degraded exam shows multiple tiny cystic lesions in the pancreas. Dominant lesion has minimally increased from 14 mm to 17 mm in the 2.5 year interval since previous MRI. No gross soft tissue component or abnormal enhancement evident on today's markedly motion degraded exam. At 17 mm, consensus guidelines recommend repeat imaging every 6 months for 2 years to ensure stability. This recommendation follows ACR consensus guidelines: Management of Incidental Pancreatic Cysts: A White Paper of the ACR Incidental Findings Committee. J Am Coll Radiol 2017;14:911-923. 2. Additional tiny 6 mm cystic foci in the body/tail region are new. Attention on follow-up recommended. 3. Abnormal fluid in the endometrial cavity of the uterus. Pelvic ultrasound for further evaluation. 4. Stable left adrenal adenoma. 5. Small hiatal hernia.   MRI of abdomen/pelvis on July 15, 2020 IMPRESSION: Motion degraded images.   16 mm unilocular pancreatic cyst, unchanged. This favors a benign pseudocyst or side branch IPMN and is of questionable clinical significance. Consider follow-up MR or CT abdomen with/without contrast in 1 year, as clinically warranted. (MR is generally favored, but CT may be preferred given motion concerns).   Suspected endometrial soft tissue/thickening, incompletely visualized/evaluated. This continues to raise concern for early endometrial neoplasm. GYN consultation is suggested.   These  results will be called to the ordering clinician or representative by the Radiologist Assistant, and  communication documented in the PACS or [employer].  Assessment & Plan:   1.  Type 2 diabetes-  Based on her presentation with controlled glycemic profile and point-of-care A1c was 6.2.  She will not need prandial insulin for now.   See above for her CGM findings.  Her A1c is improving from 12.5%.   It appears that she was taken off of Jardiance since her last visit.   Patient stands to benefit from lifestyle medicine, however she states that whole food , plant-based diet will not work for her.    - she acknowledges that there is a room for improvement in her food and drink choices. - Suggestion is made for her to avoid simple carbohydrates  from her diet including Cakes, Sweet Desserts, Ice Cream, Soda (diet and regular), Sweet Tea, Candies, Chips, Cookies, Store Bought Juices, Alcohol , Artificial Sweeteners,  Coffee Creamer, and "Sugar-free" Products, Lemonade. This will help patient to have more stable blood glucose profile and potentially avoid unintended weight gain.  The following Lifestyle Medicine recommendations according to American College of Lifestyle Medicine  [facility]) were discussed and and offered to patient and she  agrees to start the journey:  A. Whole Foods, Plant-Based Nutrition comprising of fruits and vegetables, plant-based proteins, whole-grain carbohydrates was discussed in detail with the patient.   A list for source of those nutrients were also provided to the patient.  Patient will use only water or unsweetened tea for hydration. B.  The need to stay away from risky substances including alcohol, smoking; obtaining 7 to 9 hours of restorative sleep, at least 150 minutes of moderate intensity exercise weekly, the importance of healthy social connections,  and stress management techniques were discussed. C.  A full color page of  Calorie density of various food groups per pound showing examples of each food groups was provided to the patient.  She will not need  prandial insulin for now.  She is advised to continue Tresiba 46 units nightly, associated with continuous utility of her CGM.     She is advised to call clinic for hypoglycemia below 70 or hyperglycemia greater than 200 mg per DL.  2.  Adenoma of left adrenal gland I reviewed her interval labs, imaging studies with her and her daughter in the exam room.    The left adrenal gland is reported to have mild thickening, right adrenal gland is normal.    -Her previsit plasma metanephrines are within normal limits.   -She has previously undergone 24-hour urine studies for catecholamines, metanephrines, cortisol, and aldosterone, which are all within normal limits.  -This is consistent with nonfunctioning left adrenal adenoma of 1.5 cm, stable on imaging studies.   -She will not need antiadrenal  intervention at this time.    3.  Hypothyroidism Her previsit thyroid function tests are consistent with appropriate replacement.  She is advised to continue levothyroxine 125 mcg p.o. daily before breakfast.     - We discussed about the correct intake of her thyroid hormone, on empty stomach at fasting, with water, separated by at least 30 minutes from breakfast and other medications,  and separated by more than 4 hours from calcium, iron, multivitamins, acid reflux medications (PPIs). -Patient is made aware of the fact that thyroid hormone replacement is needed for life, dose to be adjusted by periodic monitoring of thyroid function tests.    4)  Osteoporosis:  she continues to tolerate her Prolia.  She will receive her next Prolia injection today and every 6 months.     She is well-informed that Prolia injection will not be interrupted without appropriate replacement to avoid withdrawal fracture.     - I advised patient to maintain close follow up with Babs Sciara, MD for primary care needs.   I spent  26  minutes in the care of the patient today including review of labs from CMP, Lipids,  Thyroid Function, Hematology (current and previous including abstractions from other facilities); face-to-face time discussing  her blood glucose readings/logs, discussing hypoglycemia and hyperglycemia episodes and symptoms, medications doses, her options of short and long term treatment based on the latest standards of care / guidelines;  discussion about incorporating lifestyle medicine;  and documenting the encounter. Risk reduction counseling performed per USPSTF guidelines to reduce  obesity and cardiovascular risk factors.     Please refer to Patient Instructions for Blood Glucose Monitoring and Insulin/Medications Dosing Guide"  in media tab for additional information. Please  also refer to " Patient Self Inventory" in the Media  tab for reviewed elements of pertinent patient history.  Harrie Jeans participated in the discussions, expressed understanding, and voiced agreement with the above plans.  All questions were answered to her satisfaction. she is encouraged to contact clinic should she have any questions or concerns prior to her return visit.    Follow up plan: Return in about 6 months (around 12/28/2023) for Prolia Today (ASAP) and Prolia NV.   Marquis Lunch, MD [facility] [address] [place], [place] [address] Phone: [phone]  Fax: [phone]     06/28/2023, 6:46 PM  This note was partially dictated with voice recognition software. Similar sounding words can be transcribed inadequately or may not  be corrected upon review.

## 2023-06-28 NOTE — Patient Instructions (Signed)

## 2023-06-29 ENCOUNTER — Telehealth: Payer: Self-pay | Admitting: Family Medicine

## 2023-06-29 ENCOUNTER — Other Ambulatory Visit: Payer: Self-pay | Admitting: Family Medicine

## 2023-06-29 DIAGNOSIS — S42201D Unspecified fracture of upper end of right humerus, subsequent encounter for fracture with routine healing: Secondary | ICD-10-CM | POA: Insufficient documentation

## 2023-06-29 MED ORDER — OXYCODONE HCL 5 MG PO TABS: 5.0000 mg | ORAL_TABLET | ORAL | 0 refills | Status: AC | PRN

## 2023-06-29 NOTE — Telephone Encounter (Signed)
 Nurses This patient recently requested pain medication.  I sent in a refill.  It would be best to schedule her in one of my open slots somewhere in the course of the next 2 to 3 weeks please talk with the patient in tried to hopefully get her to agree-I realize she is having pain and discomfort but it would be best for [place] to see her if not we can do this as a virtual visit toward the end of the morning if she would rather do it that way That is so I can help document how things are going with her pain as well as set reasonable expectations regarding her pain medications

## 2023-06-29 NOTE — Telephone Encounter (Signed)
 Copied from CRM [id number]. Topic: Clinical - Medication Refill >> Jun 29, 2023  1:18 PM Carlatta H wrote: Most Recent Primary Care Visit:  Provider: Lilyan Punt A  Department: [facility]  Visit Type: OFFICE VISIT  Date: 04/26/2023  Medication: oxyCODONE (ROXICODONE) 5 MG immediate release tablet [[id number]]  Has the patient contacted their pharmacy? No (Agent: If no, request that the patient contact the pharmacy for the refill. If patient does not wish to contact the pharmacy document the reason why and proceed with request.) (Agent: If yes, when and what did the pharmacy advise?)  Is this the correct pharmacy for this prescription? Yes If no, delete pharmacy and type the correct one.  This is the patient's preferred pharmacy:  [facility] [address] [place] - [address] [place] [address] [place] [address] Phone: [phone] Fax: [phone]     Has the prescription been filled recently? No  Is the patient out of the medication? Yes  Has the patient been seen for an appointment in the last year OR does the patient have an upcoming appointment? Yes  Can we respond through MyChart? No  Agent: Please be advised that Rx refills may take up to 3 business days. We ask that you follow-up with your pharmacy.

## 2023-06-30 ENCOUNTER — Other Ambulatory Visit: Payer: Self-pay | Admitting: Orthopedic Surgery

## 2023-06-30 NOTE — Telephone Encounter (Signed)
 Patient states she dose not think she will be needing pain medicine ongoing and has x rays set up for Friday and if it shows that she will need long term she will call and schedule an appointment.

## 2023-07-01 ENCOUNTER — Encounter: Admitting: Orthopedic Surgery

## 2023-07-01 ENCOUNTER — Telehealth: Payer: Self-pay | Admitting: "Endocrinology

## 2023-07-01 DIAGNOSIS — S42294D Other nondisplaced fracture of upper end of right humerus, subsequent encounter for fracture with routine healing: Secondary | ICD-10-CM

## 2023-07-01 NOTE — Telephone Encounter (Signed)
 Pts daughter states that they forgot to give insulin last night and her mom's sugar is currently at 160.  What do they need to do?

## 2023-07-03 ENCOUNTER — Other Ambulatory Visit: Payer: Self-pay | Admitting: "Endocrinology

## 2023-07-03 DIAGNOSIS — E1122 Type 2 diabetes mellitus with diabetic chronic kidney disease: Secondary | ICD-10-CM

## 2023-07-04 ENCOUNTER — Encounter: Admitting: Orthopedic Surgery

## 2023-07-04 NOTE — Telephone Encounter (Signed)
 This was sent after we had left on Friday, can you check to see if they need anything else?

## 2023-07-04 NOTE — Telephone Encounter (Signed)
 Spoke with pt's daughter, she stated pt did not experience any further glucose readings out of range.

## 2023-07-06 ENCOUNTER — Other Ambulatory Visit: Payer: Self-pay | Admitting: Family Medicine

## 2023-07-06 ENCOUNTER — Telehealth: Payer: Self-pay | Admitting: Orthopedic Surgery

## 2023-07-06 ENCOUNTER — Other Ambulatory Visit: Payer: Self-pay | Admitting: "Endocrinology

## 2023-07-06 NOTE — Telephone Encounter (Signed)
 Dr. Mort Sawyers pt - Angela Adam Director w/[facility] [phone]  - they have an order to start [employer] PT and the pt as requested to wait and start on 07/08/23.  She needs a verbal order to ok this.

## 2023-07-06 NOTE — Telephone Encounter (Signed)
 Called ok to wait until Friday for start of care. Per patient request.

## 2023-07-07 ENCOUNTER — Other Ambulatory Visit: Payer: Self-pay

## 2023-07-07 ENCOUNTER — Encounter: Payer: Self-pay | Admitting: Orthopedic Surgery

## 2023-07-07 ENCOUNTER — Ambulatory Visit (INDEPENDENT_AMBULATORY_CARE_PROVIDER_SITE_OTHER): Admitting: Orthopedic Surgery

## 2023-07-07 ENCOUNTER — Other Ambulatory Visit (INDEPENDENT_AMBULATORY_CARE_PROVIDER_SITE_OTHER): Payer: Self-pay

## 2023-07-07 ENCOUNTER — Telehealth: Payer: Self-pay | Admitting: Orthopedic Surgery

## 2023-07-07 DIAGNOSIS — S42294D Other nondisplaced fracture of upper end of right humerus, subsequent encounter for fracture with routine healing: Secondary | ICD-10-CM

## 2023-07-07 MED ORDER — AZELASTINE HCL 137 MCG/SPRAY NA SOLN: NASAL | 5 refills | Status: AC

## 2023-07-07 NOTE — Telephone Encounter (Signed)
 Dr. Mort Sawyers pt - spoke w/Amy [phone] that was here w/the pt today, she is requesting an order for a wheelchair to be sent to [facility]  She stated a lot of her appointment the offices do not have wheelchairs and it really is a struggle for her to walk in.

## 2023-07-07 NOTE — Progress Notes (Signed)
 Patient ID: Kristen Mack, female   DOB: 07-17-1945, 78 y.o.   MRN: [id number]  Chief Complaint  Patient presents with   Shoulder Injury    Fracture right shoulder    Encounter Diagnosis  Name Primary?   Other closed nondisplaced fracture of proximal end of right humerus with routine healing, subsequent encounter 06/17/23 Yes   F/u right proximal humerus fracture   She is doing well, pain much less   She is adjusting to the issues with the sling   DG Shoulder Right Result Date: 07/07/2023 Right shoulder 2 views.  Proximal humerus fracture.  There is a large fracture fragment of the greater tuberosity and also surgical neck fracture.  There has been no significant displacement Impression proximal humerus fracture stable configuration

## 2023-07-07 NOTE — Progress Notes (Signed)
   There were no vitals taken for this visit.  There is no height or weight on file to calculate BMI.  Chief Complaint  Patient presents with   Shoulder Injury    Fracture right shoulder     Encounter Diagnosis  Name Primary?   Other closed nondisplaced fracture of proximal end of right humerus with routine healing, subsequent encounter 06/17/23 Yes    DOI/DOS/ Date: 06/17/23  Improved states pain not as bad as previous visit. Has home PT/OT on order start of care is tomorrow

## 2023-07-08 DIAGNOSIS — K219 Gastro-esophageal reflux disease without esophagitis: Secondary | ICD-10-CM | POA: Diagnosis not present

## 2023-07-08 DIAGNOSIS — F909 Attention-deficit hyperactivity disorder, unspecified type: Secondary | ICD-10-CM | POA: Diagnosis not present

## 2023-07-08 DIAGNOSIS — Z556 Problems related to health literacy: Secondary | ICD-10-CM | POA: Diagnosis not present

## 2023-07-08 DIAGNOSIS — I129 Hypertensive chronic kidney disease with stage 1 through stage 4 chronic kidney disease, or unspecified chronic kidney disease: Secondary | ICD-10-CM | POA: Diagnosis not present

## 2023-07-08 DIAGNOSIS — M797 Fibromyalgia: Secondary | ICD-10-CM | POA: Diagnosis not present

## 2023-07-08 DIAGNOSIS — I493 Ventricular premature depolarization: Secondary | ICD-10-CM | POA: Diagnosis not present

## 2023-07-08 DIAGNOSIS — Z9181 History of falling: Secondary | ICD-10-CM | POA: Diagnosis not present

## 2023-07-08 DIAGNOSIS — E039 Hypothyroidism, unspecified: Secondary | ICD-10-CM | POA: Diagnosis not present

## 2023-07-08 DIAGNOSIS — Z79620 Long term (current) use of immunosuppressive biologic: Secondary | ICD-10-CM | POA: Diagnosis not present

## 2023-07-08 DIAGNOSIS — Z87891 Personal history of nicotine dependence: Secondary | ICD-10-CM | POA: Diagnosis not present

## 2023-07-08 DIAGNOSIS — R32 Unspecified urinary incontinence: Secondary | ICD-10-CM | POA: Diagnosis not present

## 2023-07-08 DIAGNOSIS — E785 Hyperlipidemia, unspecified: Secondary | ICD-10-CM | POA: Diagnosis not present

## 2023-07-08 DIAGNOSIS — J45909 Unspecified asthma, uncomplicated: Secondary | ICD-10-CM | POA: Diagnosis not present

## 2023-07-08 DIAGNOSIS — Z8673 Personal history of transient ischemic attack (TIA), and cerebral infarction without residual deficits: Secondary | ICD-10-CM | POA: Diagnosis not present

## 2023-07-08 DIAGNOSIS — E1122 Type 2 diabetes mellitus with diabetic chronic kidney disease: Secondary | ICD-10-CM | POA: Diagnosis not present

## 2023-07-08 DIAGNOSIS — H8109 Meniere's disease, unspecified ear: Secondary | ICD-10-CM | POA: Diagnosis not present

## 2023-07-08 DIAGNOSIS — Z79891 Long term (current) use of opiate analgesic: Secondary | ICD-10-CM | POA: Diagnosis not present

## 2023-07-08 DIAGNOSIS — M8580 Other specified disorders of bone density and structure, unspecified site: Secondary | ICD-10-CM | POA: Diagnosis not present

## 2023-07-08 DIAGNOSIS — M1990 Unspecified osteoarthritis, unspecified site: Secondary | ICD-10-CM | POA: Diagnosis not present

## 2023-07-08 DIAGNOSIS — G4733 Obstructive sleep apnea (adult) (pediatric): Secondary | ICD-10-CM | POA: Diagnosis not present

## 2023-07-08 DIAGNOSIS — Z794 Long term (current) use of insulin: Secondary | ICD-10-CM | POA: Diagnosis not present

## 2023-07-08 DIAGNOSIS — F32A Depression, unspecified: Secondary | ICD-10-CM | POA: Diagnosis not present

## 2023-07-08 DIAGNOSIS — K760 Fatty (change of) liver, not elsewhere classified: Secondary | ICD-10-CM | POA: Diagnosis not present

## 2023-07-08 DIAGNOSIS — K589 Irritable bowel syndrome without diarrhea: Secondary | ICD-10-CM | POA: Diagnosis not present

## 2023-07-08 DIAGNOSIS — S42294D Other nondisplaced fracture of upper end of right humerus, subsequent encounter for fracture with routine healing: Secondary | ICD-10-CM | POA: Diagnosis not present

## 2023-07-08 DIAGNOSIS — N183 Chronic kidney disease, stage 3 unspecified: Secondary | ICD-10-CM | POA: Diagnosis not present

## 2023-07-11 NOTE — Progress Notes (Deleted)
  Cardiology Office Note:  .   Date:  07/11/2023  ID:  Kristen Mack, DOB 1946/01/25, MRN [id number] PCP: Bennet Brasil, MD  [facility] Providers Cardiologist:  Oneil Bigness, MD { Click to update primary MD,subspecialty MD or APP then REFRESH:1}   History of Present Illness: .   No chief complaint on file.   Kristen Mack is a 78 y.o. female with history of PVCs, HLD, DM who presents for follow-up.      Problem List 1. Diabetes -A1c 6.2 -T chol 134, HDL 42, LDL 59, TG 198 2. Hypothyroidism  3. CKD 3b 4. CVA 5. OSA 6. PVCs  -22% burden -in setting of iatrogenic hyperthyroidism  -EF 55-60%    ROS: All other ROS reviewed and negative. Pertinent positives noted in the HPI.     Studies Reviewed: Aaron Aas       TTE 04/14/2023  1. Left ventricular ejection fraction, by estimation, is 55 to 60%. The  left ventricle has normal function. The left ventricle has no regional  wall motion abnormalities. There is moderate concentric left ventricular  hypertrophy. Left ventricular  diastolic parameters are consistent with Grade I diastolic dysfunction  (impaired relaxation). Elevated left atrial pressure.   2. Right ventricular systolic function is normal. The right ventricular  size is normal.   3. Left atrial size was mildly dilated.   4. Trivial mitral valve regurgitation.   5. The aortic valve is tricuspid. Aortic valve regurgitation is not  visualized.   6. The inferior vena cava is normal in size with greater than 50%  respiratory variability, suggesting right atrial pressure of 3 mmHg.  Physical Exam:   VS:  There were no vitals taken for this visit.   Wt Readings from Last 3 Encounters:  06/20/23 216 lb 0.8 oz (98 kg)  06/17/23 216 lb 0.8 oz (98 kg)  04/26/23 217 lb 12.8 oz (98.8 kg)    GEN: Well nourished, well developed in no acute distress NECK: No JVD; No carotid bruits CARDIAC: ***RRR, no murmurs, rubs, gallops RESPIRATORY:  Clear to  auscultation without rales, wheezing or rhonchi  ABDOMEN: Soft, non-tender, non-distended EXTREMITIES:  No edema; No deformity  ASSESSMENT AND PLAN: .   ***    {Are you ordering a CV Procedure (e.g. stress test, cath, DCCV, TEE, etc)?   Press F2        :[id number]}   Follow-up: No follow-ups on file.  Time Spent with Patient: I have spent a total of *** minutes caring for this patient today face to face, ordering and reviewing labs/tests, reviewing prior records/medical history, examining the patient, establishing an assessment and plan, communicating results/findings to the patient/family, and documenting in the medical record.   Signed, Gigi Kyle. Rolm Clos, MD, [facility]  [facility]  [address] [place], [place] [address] [phone]  8:09 PM

## 2023-07-12 ENCOUNTER — Ambulatory Visit: Payer: PPO | Admitting: Cardiovascular Disease

## 2023-07-12 DIAGNOSIS — I493 Ventricular premature depolarization: Secondary | ICD-10-CM

## 2023-07-12 DIAGNOSIS — E782 Mixed hyperlipidemia: Secondary | ICD-10-CM

## 2023-07-12 DIAGNOSIS — I10 Essential (primary) hypertension: Secondary | ICD-10-CM

## 2023-07-13 ENCOUNTER — Encounter: Payer: Self-pay | Admitting: Family Medicine

## 2023-07-13 ENCOUNTER — Telehealth: Payer: Self-pay | Admitting: Orthopedic Surgery

## 2023-07-13 ENCOUNTER — Other Ambulatory Visit: Payer: Self-pay

## 2023-07-13 ENCOUNTER — Encounter: Payer: Self-pay | Admitting: Urology

## 2023-07-13 DIAGNOSIS — R930 Abnormal findings on diagnostic imaging of skull and head, not elsewhere classified: Secondary | ICD-10-CM

## 2023-07-13 DIAGNOSIS — E1122 Type 2 diabetes mellitus with diabetic chronic kidney disease: Secondary | ICD-10-CM | POA: Diagnosis not present

## 2023-07-13 NOTE — Telephone Encounter (Signed)
 Dr. Delfino Fellers pt - spoke w/Darlene w/[facility] [phone], she stated that she needs clarification on the pt's oders.

## 2023-07-13 NOTE — Telephone Encounter (Signed)
 I would recommend neurology consultation.  Due to abnormal CT scan of the head Typical approach for this type of issue is keeping blood pressure and cholesterol under good control. May go ahead with referral as requested

## 2023-07-13 NOTE — Telephone Encounter (Signed)
 I called she is asking about restrictions, I advised per Dr Phyllis Breeze no restrictions advance as tolerated with ROM and WB she voiced understanding

## 2023-07-14 ENCOUNTER — Ambulatory Visit: Payer: PPO | Admitting: "Endocrinology

## 2023-07-14 ENCOUNTER — Telehealth: Payer: Self-pay

## 2023-07-14 NOTE — Telephone Encounter (Signed)
 [facility] PT called to let us  know that patient had complaints of strong odor and cloudy urine.  Call back is [phone].  I called patient to follow up on her symptoms, patient's daughter denies dysuria, no complaints of burning and denies fever.  Advised daughter to have patient increase fluids at this time and monitor temperature and symptoms.  She was advised to call back if symptoms develop or patient has a fever.  Daughter request ua/uc orders be faxed to [facility] on [address] in [place] if patient needs to give a urine specimen.

## 2023-07-14 NOTE — Telephone Encounter (Signed)
 See telephone encounter, spoke with daughter.

## 2023-07-20 ENCOUNTER — Ambulatory Visit: Payer: Self-pay | Admitting: Family Medicine

## 2023-07-20 ENCOUNTER — Ambulatory Visit
Admission: EM | Admit: 2023-07-20 | Discharge: 2023-07-20 | Disposition: A | Discharge status: Home / Self Care | Attending: Family Medicine | Admitting: Family Medicine

## 2023-07-20 ENCOUNTER — Other Ambulatory Visit: Payer: Self-pay

## 2023-07-20 DIAGNOSIS — R42 Dizziness and giddiness: Secondary | ICD-10-CM

## 2023-07-20 DIAGNOSIS — I160 Hypertensive urgency: Secondary | ICD-10-CM | POA: Diagnosis not present

## 2023-07-20 NOTE — ED Provider Notes (Addendum)
 [facility]    CSN: [id number] Arrival date & time: 07/20/23  1704      History   Chief Complaint No chief complaint on file.   HPI Kristen Mack is a 78 y.o. female.   Patient presenting today with significantly elevated blood pressure readings and an episode of lightheadedness that was around 3:30 PM this afternoon.  She called her primary care provider and spoke with the triage nurse who recommended she go to the emergency department but she declined this.  She states the lightheadedness has now dissipated but she is wanting to discuss her blood pressure medication.  She is on a diuretic but feels that she needs to be on more blood pressure medication.  Denies chest pain, shortness of breath, visual change, headache, ongoing dizziness, mental status changes, weakness numbness or tingling.  She was able to schedule with the triage nurse a primary care appointment for tomorrow to follow-up on this issue.    Past Medical History:  Diagnosis Date   ADHD (attention deficit hyperactivity disorder) 2007   Adrenal adenoma, left    followed by dr nida   Arthritis    Asthma    followed by pcp   CFS (chronic fatigue syndrome)    CKD (chronic kidney disease), stage III [facility])    nephrologist---  dr Jearldine Mina   Depression    Edema of both lower extremities    Fatty liver disease, nonalcoholic    Fibromyalgia    GERD (gastroesophageal reflux disease)    Hiatal hernia    History of adenomatous polyp of colon    History of cardiac murmur    until age 31- pt has had echocardiograms   History of TIA (transient ischemic attack)    per CT scan    Hyperlipidemia    Hypertension    Hypothyroidism    endocrinologist-- dr g. nida   IBS (irritable bowel syndrome)    Meniere disease    OSA on CPAP    per pt uses nightly   Osteopenia    Pancreas cyst    followed by pcp   PMB (postmenopausal bleeding)    PVC's (premature ventricular contractions)    cardiologist--- dr  Rolm Clos   Type 2 diabetes mellitus treated with insulin  [facility])    endocrinologist-- dr g. nida;    (08-14-2021  per pt checks blood sugar QID ,  fasting average --- 73-150)   Urinary incontinence    Wears glasses    Wears partial dentures    upper    Patient Active Problem List   Diagnosis Date Noted   Closed fracture of proximal end of right humerus with routine healing 06/29/2023   Urinary frequency 06/01/2023   Heart failure with preserved ejection fraction, unspecified HF chronicity (HCC) 04/26/2023   Moderate episode of recurrent major depressive disorder (HCC) 04/26/2022   Stage 3b chronic kidney disease (HCC) 04/26/2022   DOE (dyspnea on exertion) 01/29/2022   Symptomatic bradycardia 12/11/2021   Hypokalemia 12/11/2021   Type 2 DM with CKD stage 3 and hypertension (HCC) 08/10/2021   Trochanteric bursitis 06/05/2021   History of cystocele 08/04/2020   Urinary incontinence 08/04/2020   Bruising tendency 06/30/2020   Thickened endometrium 03/06/2020   Non-alcoholic fatty liver disease 03/06/2020   Chronic fatigue syndrome 03/04/2020   DDD (degenerative disc disease), cervical 03/04/2020   History of multiple allergies 03/04/2020   Mnire's disease 03/04/2020   Depressive disorder 03/04/2020   Hypertensive disorder 03/04/2020   Diabetes mellitus (HCC)  03/04/2020   Asthma 03/04/2020   Meniere's disease 03/04/2020   Mixed hyperlipidemia 02/13/2020   Diabetes mellitus without complication (HCC) 10/31/2018   OSA on CPAP 07/11/2017   Pancreatic pseudocyst 05/10/2017   Adrenal adenoma 05/10/2017   Senile osteoporosis 11/21/2016   Fibromyalgia 08/10/2016   Female stress incontinence 11/05/2015   NASH (nonalcoholic steatohepatitis) 04/24/2015   Orthostatic hypotension 09/19/2013   Elevated transaminase level 09/19/2013   HTN (hypertension), benign 09/03/2013   Depression 08/14/2013   Fatty liver 08/14/2013   Other specified hypothyroidism 10/23/2012   Osteopenia 07/08/2012    Morbid obesity (HCC) 07/08/2012   Urinary bladder incontinence 04/30/2012   Family history of rectal cancer 10/04/2011   GERD (gastroesophageal reflux disease) 10/04/2011   IBS (irritable bowel syndrome) 10/04/2011    Past Surgical History:  Procedure Laterality Date   CERVICAL POLYPECTOMY N/A 08/17/2021   Procedure: BIOPSY OF ENDOMETRIUM AND OR POLYPECTOMY;  Surgeon: Artemisa Bile, MD;  Location: [facility];  Service: Gynecology;  Laterality: N/A;   CESAREAN SECTION  1977  &  1979   CHOLECYSTECTOMY  1995   and  REPAIR UMBILICAL HERNIA   COLONOSCOPY N/A 04/19/2013   Procedure: COLONOSCOPY;  Surgeon: Ruby Corporal, MD;  [facility];  Service: Endoscopy;  Laterality: N/A;     DILITATION & CURRETTAGE/HYSTROSCOPY WITH NOVASURE ABLATION N/A 08/17/2021   Procedure: DILATATION & CURETTAGE/HYSTEROSCOPY;  Surgeon: Artemisa Bile, MD;  Location: [facility];  Service: Gynecology;  Laterality: N/A;   ESOPHAGOGASTRODUODENOSCOPY  02/08/2003   HERNIA REPAIR  1995   HYSTEROSCOPY WITH D & C  11/14/2002   endometrial polypectomy   HYSTEROSCOPY WITH D & C N/A 04/05/2014   Procedure: DILATATION AND CURETTAGE /HYSTEROSCOPY, ENDOMETRIAL POLYPECTOMY;  Surgeon: Artemisa Bile, MD;  Location: [facility];  Service: Gynecology;  Laterality: N/A;   LIVER BIOPSY  01/29/2004   benign   TUBAL LIGATION  1979    OB History     Gravida  5   Para      Term      Preterm      AB  3   Living  2      SAB  3   IAB      Ectopic      Multiple      Live Births  2            Home Medications    Prior to Admission medications   Medication Sig Start Date End Date Taking? Authorizing Provider  albuterol  (PROVENTIL ) (2.5 MG/3ML) 0.083% nebulizer solution Take 3 mLs (2.5 mg total) by nebulization every 6 (six) hours as needed for wheezing or shortness of breath. 01/28/22   Diamond Formica, MD  albuterol  (VENTOLIN  HFA) 108 (90 Base) MCG/ACT inhaler  Inhale 2 puffs into the lungs every 4 (four) hours as needed. INHALE 2 PUFFS INTO LUNGS EVERY 6 HOURS AS NEEDED FOR SHORTNESS OF BREATH 03/13/23   Corbin Dess, PA-C  Azelastine  HCl 137 MCG/SPRAY SOLN 2 sprays each nostrils BID 07/07/23   Bennet Brasil, MD  B Complex Vitamins (B COMPLEX 100 PO) Take 1 tablet by mouth daily.    [provider]  blood glucose meter kit and supplies Dispense based on patient and insurance preference. Use to test sugars BID  (FOR ICD-10 E10.9, E11.9). 07/17/21   Bennet Brasil, MD  buPROPion  (WELLBUTRIN  XL) 300 MG 24 hr tablet Take 1 tablet by mouth once daily 06/27/23   Luking,  Scott A, MD  Chromium Picolinate 1000 MCG TABS Take 1 tablet by mouth daily.    [provider]  Coenzyme Q10 (COQ10) 200 MG CAPS Take 200 mg by mouth daily.    [provider]  Continuous Glucose Receiver (FREESTYLE LIBRE 3 READER) DEVI 1 Piece by Does not apply route once as needed for up to 1 dose. 06/28/23   Nida, Gebreselassie W, MD  Continuous Glucose Sensor (FREESTYLE LIBRE 3 PLUS SENSOR) MISC Change sensor every 15 days. 06/28/23   Nida, Gebreselassie W, MD  Cyanocobalamin (B-12) 1000 MCG SUBL Place 1,000 mcg under the tongue daily.    [provider]  denosumab  (PROLIA ) 60 MG/ML SOSY injection INJECT 60 MG SUBCUTANEOUSLY ONCE FOR 1 DOSE 12/07/22   Nida, Gebreselassie W, MD  DIGESTIVE ENZYMES PO Take 1 tablet by mouth 3 (three) times daily.     [provider]  furosemide  (LASIX ) 20 MG tablet 1 qam prn pedal edema 03/17/23   Bennet Brasil, MD  gabapentin  (NEURONTIN ) 100 MG capsule Take 1 capsule (100 mg total) by mouth 2 (two) times daily. 02/03/23   Bennet Brasil, MD  glucose blood test strip Use to check blood glucose four times daily as instructed 09/03/21   Nida, Gebreselassie W, MD  Insulin  Pen Needle (RELION PEN NEEDLES) 31G X 6 MM MISC USE 1 PEN NEEDLE TO INJECT INSULIN  ONCE DAILY 01/25/23   Bennet Brasil, MD  levothyroxine   (SYNTHROID ) 125 MCG tablet TAKE 1 TABLET BY MOUTH ONCE DAILY BEFORE BREAKFAST 07/07/23   Nida, Gebreselassie W, MD  lidocaine  (LIDODERM ) 5 % Place 1 patch onto the skin daily. Remove & Discard patch within 12 hours or as directed by MD 06/17/23   Kommor, Alyse July, MD  Lysine HCl 500 MG TABS Take 500 mg by mouth daily.    [provider]  Magnesium  250 MG TABS Take 250 mg by mouth 2 (two) times daily.    [provider]  meclizine (ANTIVERT) 25 MG tablet Take 25 mg by mouth 3 (three) times daily as needed for dizziness.    [provider]  mirabegron  ER (MYRBETRIQ ) 25 MG TB24 tablet Take 1 tablet (25 mg total) by mouth daily. 06/01/23   McKenzie, Arden Beck, MD  montelukast  (SINGULAIR ) 10 MG tablet TAKE 1 TABLET BY MOUTH AT BEDTIME 04/12/23   Luking, Jackelyn Marvel, MD  OVER THE COUNTER MEDICATION Take 1 Scoop by mouth 3 (three) times daily with meals. Fennel seeds    [provider]  OVER THE COUNTER MEDICATION Choline and inisotol    [provider]  oxyCODONE  (ROXICODONE ) 5 MG immediate release tablet Take 1 tablet (5 mg total) by mouth every 4 (four) hours as needed for breakthrough pain. 06/29/23   Bennet Brasil, MD  oxyCODONE -acetaminophen  (PERCOCET/ROXICET) 5-325 MG tablet Take 1 tablet by mouth every 4 (four) hours as needed for severe pain (pain score 7-10). 06/21/23   Pollina, Marine Sia, MD  rosuvastatin  (CRESTOR ) 5 MG tablet TAKE 1 TABLET BY MOUTH ON MONDAY, WEDNESDAY,  FRIDAY AND SATURDAY 02/03/23   Bennet Brasil, MD  spironolactone  (ALDACTONE ) 25 MG tablet Take 1 tablet (25 mg total) by mouth daily. 06/28/23   Baby Bolt, MD  TART CHERRY PO Take 2 tablets by mouth 3 (three) times daily.    [provider]  tiZANidine  (ZANAFLEX ) 2 MG tablet TAKE 1 TABLET BY MOUTH AT BEDTIME AS NEEDED, MAY CAUSE DROWSINESS 02/23/23   Bennet Brasil, MD  [facility]  FLEXTOUCH 100 UNIT/ML FlexTouch Pen Inject 46 Units into the skin at bedtime. 07/05/23   Nida,  Gebreselassie W, MD  Vitamin A 3 MG (10000 UT) TABS Take 10,000 Units by mouth daily.    [provider]  vitamin E 180 MG (400 UNITS) capsule Take 800 Units by mouth daily.     [provider]    Family History Family History  Problem Relation Age of Onset   Rectal cancer Brother 4   Diabetes Brother    Ovarian cancer Mother 7   Hypertension Mother    Diabetes Mother    Cancer Mother    Depression Mother    Early death Mother    Obesity Mother    Hypertension Father    Diabetes Father    Depression Father    Hyperlipidemia Father    Stroke Father    Breast cancer Maternal Aunt    Breast cancer Paternal Aunt        four Paternal aunts   Heart disease Paternal Grandfather    Hyperlipidemia Paternal Grandfather    Hypertension Paternal Grandfather    Stroke Paternal Grandfather    Liver disease Sister        x 2; fatty liver   Hearing loss Maternal Grandfather    Hypertension Maternal Grandfather    Heart disease Maternal Grandmother    Hypertension Maternal Grandmother    Obesity Maternal Grandmother    Obesity Paternal Grandmother    ADD / ADHD Daughter    Depression Daughter    ADD / ADHD Daughter    Depression Daughter    Alcohol abuse Maternal Uncle    Depression Maternal Uncle    Drug abuse Maternal Uncle    Early death Maternal Uncle    Alcohol abuse Paternal Uncle    Depression Paternal Uncle    Drug abuse Paternal Uncle    Anxiety disorder Sister    Depression Sister    Hyperlipidemia Sister    Miscarriages / Stillbirths Sister    Cancer Brother    Depression Brother    Early death Brother    Cancer Maternal Aunt    Cancer Maternal Aunt    Cancer Paternal Aunt    Cancer Paternal Aunt    Cancer Paternal Aunt    Cancer Paternal Aunt    Drug abuse Brother    Heart disease Maternal Uncle    Hyperlipidemia Maternal Uncle    Hypertension Maternal Uncle    Hyperlipidemia Sister    Hyperlipidemia Brother    Hyperlipidemia Brother     Hypertension Maternal Uncle    Pancreatic cancer Neg Hx    Esophageal cancer Neg Hx     Social History Social History   Tobacco Use   Smoking status: Former    Current packs/day: 0.00    Average packs/day: 1.5 packs/day for 3.0 years (4.5 ttl pk-yrs)    Types: Cigarettes    Start date: 10/03/1965    Quit date: 10/03/1968    Years since quitting: 54.8    Passive exposure: Past   Smokeless tobacco: Never  Vaping Use   Vaping status: Never Used  Substance Use Topics   Alcohol use: No   Drug use: No     Allergies   Latex, Ambien [zolpidem tartrate], Cefzil  [cefprozil ], Citalopram , Codeine, Demerol  [meperidine ], Formaldehyde, Sulfa antibiotics, Zolpidem, and Celexa  [citalopram  hydrobromide]   Review of Systems Review of Systems Per HPI  Physical Exam Triage Vital Signs ED Triage Vitals  Encounter Vitals Group  BP 07/20/23 1710 (!) 190/99     Systolic BP Percentile --      Diastolic BP Percentile --      Pulse Rate 07/20/23 1710 82     Resp 07/20/23 1710 20     Temp 07/20/23 1710 98.3 F (36.8 C)     Temp src --      SpO2 07/20/23 1710 95 %     Weight --      Height --      Head Circumference --      Peak Flow --      Pain Score 07/20/23 1712 0     Pain Loc --      Pain Education --      Exclude from Growth Chart --    Orthostatic VS for the past 24 hrs:  BP- Lying Pulse- Lying BP- Sitting Pulse- Sitting BP- Standing at 0 minutes Pulse- Standing at 0 minutes  07/20/23 1730 200/89 80 195/89 78 194/89 95    Updated Vital Signs BP (!) 190/99 (BP Location: Right Arm)   Pulse 82   Temp 98.3 F (36.8 C)   Resp 20   SpO2 95%   Visual Acuity Right Eye Distance:   Left Eye Distance:   Bilateral Distance:    Right Eye Near:   Left Eye Near:    Bilateral Near:     Physical Exam Vitals and nursing note reviewed.  Constitutional:      Appearance: Normal appearance. She is not ill-appearing.  HENT:     Head: Atraumatic.     Mouth/Throat:     Mouth:  Mucous membranes are moist.  Eyes:     Extraocular Movements: Extraocular movements intact.     Conjunctiva/sclera: Conjunctivae normal.     Pupils: Pupils are equal, round, and reactive to light.  Cardiovascular:     Rate and Rhythm: Normal rate and regular rhythm.     Heart sounds: Normal heart sounds.  Pulmonary:     Effort: Pulmonary effort is normal.     Breath sounds: Normal breath sounds.  Musculoskeletal:        General: Normal range of motion.     Cervical back: Normal range of motion and neck supple.  Skin:    General: Skin is warm and dry.  Neurological:     General: No focal deficit present.     Mental Status: She is alert and oriented to person, place, and time.     Cranial Nerves: No cranial nerve deficit.     Motor: No weakness.  Psychiatric:        Mood and Affect: Mood normal.        Thought Content: Thought content normal.        Judgment: Judgment normal.      UC Treatments / Results  Labs (all labs ordered are listed, but only abnormal results are displayed) Labs Reviewed - No data to display  EKG   Radiology No results found.  Procedures Procedures (including critical care time)  Medications Ordered in UC Medications - No data to display  Initial Impression / Assessment and Plan / UC Course  I have reviewed the triage vital signs and the nursing notes.  Pertinent labs & imaging results that were available during my care of the patient were reviewed by me and considered in my medical decision making (see chart for details).     Significantly hypertensive today, otherwise vital signs within normal limits.  She is very well-appearing, alert  and oriented with no focal neurologic deficits on exam.  EKG today showing sinus rhythm with PACs and LVH.  81 bpm.  Orthostatic vital signs negative.  Blood sugar currently 116, she has a Dexcom that we have pulled this data from instead of a point-of-care glucose test.  She states her lightheadedness has  completely dissipated and she is asymptomatic at this time and just wanting to discuss labs and blood pressure medication changes.  Discussed with patient that we do not start new chronic medications in the setting and she should follow-up as scheduled with her primary care provider tomorrow regarding both of these things.  She again declines going to the emergency department for further evaluation of her episode of lightheadedness but agreeable to going if her symptoms worsen.  Final Clinical Impressions(s) / UC Diagnoses   Final diagnoses:  Hypertensive urgency  Lightheaded     Discharge Instructions      Your blood pressure continues to be significantly elevated today.  As we discussed, I do recommend that you go to the emergency department for further evaluation and monitoring particular given your episode of lightheadedness but if you do not wish to do this please keep your primary care follow-up tomorrow to discuss medication management and symptoms.  Go to the emergency department if symptoms significantly worsen at any time.    ED Prescriptions   None    PDMP not reviewed this encounter.   Corbin Dess, [place] 07/20/23 1821    Corbin Dess, [place] 07/20/23 [phone]

## 2023-07-20 NOTE — ED Notes (Signed)
 Patient is being discharged from the Urgent Care and sent to the Emergency Department via private vehicle . Per PA, patient is in need of higher level of care due to high blood pressure and episodes of dizziness. Patient is aware and verbalizes understanding of plan of care.  Vitals:   07/20/23 1710  BP: (!) 190/99  Pulse: 82  Resp: 20  Temp: 98.3 F (36.8 C)  SpO2: 95%

## 2023-07-20 NOTE — ED Triage Notes (Signed)
 Pt reports elevated BP at 3:30 pm and feeling light headed started around the same time.

## 2023-07-20 NOTE — Discharge Instructions (Signed)
 Your blood pressure continues to be significantly elevated today.  As we discussed, I do recommend that you go to the emergency department for further evaluation and monitoring particular given your episode of lightheadedness but if you do not wish to do this please keep your primary care follow-up tomorrow to discuss medication management and symptoms.  Go to the emergency department if symptoms significantly worsen at any time.

## 2023-07-20 NOTE — Telephone Encounter (Signed)
I agree with this approach. 

## 2023-07-20 NOTE — Telephone Encounter (Signed)
 Copied from CRM [id number]. Topic: Clinical - Red Word Triage >> Jul 20, 2023  4:08 PM Kristen Mack wrote: Red Word that prompted transfer to Nurse Triage: dizziness, elevated bp 168/100 170/100  Chief Complaint: High blood pressure Symptoms: lightheadedness and dizziness Frequency: constant Pertinent Negatives: Patient denies chest pain Disposition: [x] ED /[] Urgent Care (no appt availability in office) / [] Appointment(In office/virtual)/ []  [facility]/ [] Home Care/ [x] Refused Recommended Disposition /[]  Mobile Bus/ []  Follow-up with PCP Additional Notes: Kristen Mack, PTA from [facility] with patient and reports elevated BP. Pt endorsing lightheadedness and dizziness as well. Denies missing medication doses., RN advising ED, pt refuses. Call transferred to CAL Reason for Disposition  [1] Systolic BP  >= 160 OR Diastolic >= 100 AND [2] cardiac (e.g., breathing difficulty, chest pain) or neurologic symptoms (e.g., new-onset blurred or double vision, unsteady gait)  Answer Assessment - Initial Assessment Questions 1. BLOOD PRESSURE: "What is the blood pressure?" "Did you take at least two measurements 5 minutes apart?"     168/100 and 170/100  2. ONSET: "When did you take your blood pressure?"     Taken within last 25 minutes before call  3. HOW: "How did you take your blood pressure?" (e.g., automatic home BP monitor, visiting nurse)     Visiting [facility] nurse  4. HISTORY: "Do you have a history of high blood pressure?"     Yes  5. MEDICINES: "Are you taking any medicines for blood pressure?" "Have you missed any doses recently?"     Has not missed doses  6. OTHER SYMPTOMS: "Do you have any symptoms?" (e.g., blurred vision, chest pain, difficulty breathing, headache, weakness)     Dizziness, lightheadedness  7. PREGNANCY: "Is there any chance you are pregnant?" "When was your last menstrual period?"     No  Protocols used: Blood Pressure - High-A-AH

## 2023-07-20 NOTE — Telephone Encounter (Signed)
 Spoke with home health nurse and family regarding elevated bp readings and lightheadedness and dizziness, pt was also advised to go to urgent care or ER depending on symptoms, denied any chest pain or weakness on either side. appt also scheduled for tomorrow for further evaluation.

## 2023-07-21 ENCOUNTER — Ambulatory Visit (INDEPENDENT_AMBULATORY_CARE_PROVIDER_SITE_OTHER): Admitting: Nurse Practitioner

## 2023-07-21 VITALS — BP 194/98 | HR 68 | Temp 97.2°F | Ht 59.0 in | Wt 228.4 lb

## 2023-07-21 DIAGNOSIS — R8290 Unspecified abnormal findings in urine: Secondary | ICD-10-CM

## 2023-07-21 DIAGNOSIS — I129 Hypertensive chronic kidney disease with stage 1 through stage 4 chronic kidney disease, or unspecified chronic kidney disease: Secondary | ICD-10-CM

## 2023-07-21 DIAGNOSIS — E1122 Type 2 diabetes mellitus with diabetic chronic kidney disease: Secondary | ICD-10-CM | POA: Diagnosis not present

## 2023-07-21 DIAGNOSIS — I10 Essential (primary) hypertension: Secondary | ICD-10-CM

## 2023-07-21 DIAGNOSIS — Z79899 Other long term (current) drug therapy: Secondary | ICD-10-CM | POA: Diagnosis not present

## 2023-07-21 DIAGNOSIS — N183 Chronic kidney disease, stage 3 unspecified: Secondary | ICD-10-CM | POA: Diagnosis not present

## 2023-07-21 DIAGNOSIS — N3945 Continuous leakage: Secondary | ICD-10-CM | POA: Diagnosis not present

## 2023-07-21 DIAGNOSIS — N1832 Chronic kidney disease, stage 3b: Secondary | ICD-10-CM

## 2023-07-21 LAB — POCT UA - MICROSCOPIC ONLY
Bacteria, U Microscopic: NEGATIVE
RBC, Urine, Miroscopic: NEGATIVE (ref 0–2)

## 2023-07-21 LAB — POCT URINALYSIS DIP (CLINITEK)
Bilirubin, UA: NEGATIVE
Blood, UA: NEGATIVE
Glucose, UA: NEGATIVE mg/dL
Ketones, POC UA: NEGATIVE mg/dL
Nitrite, UA: NEGATIVE
POC PROTEIN,UA: NEGATIVE
Spec Grav, UA: 1.01 (ref 1.010–1.025)
Urobilinogen, UA: 0.2 U/dL
pH, UA: 6 (ref 5.0–8.0)

## 2023-07-21 MED ORDER — VALSARTAN 80 MG PO TABS: 80.0000 mg | ORAL_TABLET | Freq: Every day | ORAL | 0 refills | Status: DC

## 2023-07-21 MED ORDER — CEPHALEXIN 500 MG PO CAPS: 500.0000 mg | ORAL_CAPSULE | Freq: Three times a day (TID) | ORAL | 0 refills | Status: DC

## 2023-07-21 NOTE — Progress Notes (Unsigned)
 Subjective:    Patient ID: Kristen Mack, female    DOB: 21-Sep-1945, 78 y.o.   MRN: [id number]  HPI Presents with her daughters for complaints of elevated blood pressure.  Currently on furosemide  20 mg daily as needed for pedal edema.  Also started on spironolactone , that dose was recently reduced to 25 mg daily.  Family is taking her blood pressure with a wrist cuff, states that even the extra-large cuff is too uncomfortable for her to use.  It was noted in the history that patient's blood pressure began going very high back in March this was around the time of her right arm injury.  Patient also has been intaking more sodium through her diet.  Sees Dr. Monte Antonio for her diabetes.  Currently on Tresiba 46 units at bedtime.  Wears a continuous blood glucose sensor.  Recently switched to the new freestyle libre 3+.  Family has checked her low blood sugars with her machine as well sometimes getting different numbers.  Has had a few episodes of her sugars getting into the 60s. Has noticed cloudy urine with odor for about a month worse lately.  Urinary incontinence was doing better on Myrbetriq  although lately she has had increased urinary incontinence.  No fever.  Her temperatures at home have been normal.  No dysuria urgency or frequency.  No CVA or flank pain.  Sees urology for urinary incontinence and frequency.   Review of Systems  Constitutional:  Positive for fatigue. Negative for fever.  Respiratory:  Negative for cough, chest tightness and shortness of breath.   Cardiovascular:  Positive for leg swelling. Negative for chest pain.  Genitourinary:  Positive for enuresis. Negative for dysuria, flank pain, frequency and urgency.  Neurological:  Negative for syncope, facial asymmetry, speech difficulty, weakness and numbness.   Social History   Tobacco Use   Smoking status: Former    Current packs/day: 0.00    Average packs/day: 1.5 packs/day for 3.0 years (4.5 ttl pk-yrs)    Types:  Cigarettes    Start date: 10/03/1965    Quit date: 10/03/1968    Years since quitting: 54.8    Passive exposure: Past   Smokeless tobacco: Never  Vaping Use   Vaping status: Never Used  Substance Use Topics   Alcohol use: No   Drug use: No        Objective:   Physical Exam Exam was performed in a wheelchair due to her orthopedic issues.  NAD.  Alert, oriented.  Lungs clear.  Heart regular rate rhythm.  Abdomen soft nondistended nontender.  No CVA tenderness to percussion. Today's Vitals   07/21/23 1501  BP: (!) 194/98  Pulse: 68  Temp: (!) 97.2 F (36.2 C)  SpO2: 98%  Weight: 228 lb 6.4 oz (103.6 kg)  Height: 4\' 11"  (1.499 m)   Body mass index is 46.13 kg/m. Her current BP reading is consistent with the last several readings in our office.  Results for orders placed or performed in visit on 07/21/23  POCT URINALYSIS DIP (CLINITEK)   Collection Time: 07/21/23  4:10 PM  Result Value Ref Range   Color, UA light yellow (A) yellow   Clarity, UA cloudy (A) clear   Glucose, UA negative negative mg/dL   Bilirubin, UA negative negative   Ketones, POC UA negative negative mg/dL   Spec Grav, UA 5.621 3.086 - 1.025   Blood, UA negative negative   pH, UA 6.0 5.0 - 8.0   POC PROTEIN,UA negative negative, trace  Urobilinogen, UA 0.2 0.2 or 1.0 E.U./dL   Nitrite, UA Negative Negative   Leukocytes, UA Moderate (2+) (A) Negative  POCT UA - Microscopic Only   Collection Time: 07/21/23  4:54 PM  Result Value Ref Range   WBC, Ur, HPF, POC 5-10 0 - 5   RBC, Urine, Miroscopic neg 0 - 2   Bacteria, U Microscopic neg None - Trace   Mucus, UA     Epithelial cells, urine per micros rare    Crystals, Ur, HPF, POC     Casts, Ur, LPF, POC     Yeast, UA     Small clumps of WBCs noted.      Assessment & Plan:   Problem List Items Addressed This Visit       Cardiovascular and Mediastinum   HTN (hypertension), benign - Primary   Relevant Medications   valsartan  (DIOVAN ) 80 MG tablet    Other Relevant Orders   Microalbumin/Creatinine Ratio, Urine   Comprehensive metabolic panel with GFR (Completed)   Type 2 DM with CKD stage 3 and hypertension (HCC)   Relevant Medications   valsartan  (DIOVAN ) 80 MG tablet   Other Relevant Orders   Comprehensive metabolic panel with GFR (Completed)   Uncontrolled hypertension   Relevant Medications   valsartan  (DIOVAN ) 80 MG tablet     Genitourinary   Stage 3b chronic kidney disease (HCC)   Relevant Orders   Comprehensive metabolic panel with GFR (Completed)     Other   Urinary incontinence   Relevant Orders   POCT UA - Microscopic Only (Completed)   Other Visit Diagnoses       Abnormal urine odor       Relevant Orders   POCT URINALYSIS DIP (CLINITEK) (Completed)   Urine Culture   POCT UA - Microscopic Only (Completed)     High risk medication use       Relevant Orders   Comprehensive metabolic panel with GFR (Completed)      Meds ordered this encounter  Medications   valsartan  (DIOVAN ) 80 MG tablet    Sig: Take 1 tablet (80 mg total) by mouth daily. For BP and kidneys    Dispense:  30 tablet    Refill:  0    Supervising Provider:   Charlotta Cook A [[id number]]   cephALEXin  (KEFLEX ) 500 MG capsule    Sig: Take 1 capsule (500 mg total) by mouth 3 (three) times daily.    Dispense:  21 capsule    Refill:  0    She has taken Cephalexin  without difficulty in the past    Supervising Provider:   Charlotta Cook A [[id number]]   Urine culture pending.  Start Keflex  as directed.  Further follow-up based on results.  Warning signs were reviewed with patient and her family.  Go to ED or urgent care in the meantime if any worsening symptoms. Discussed importance of getting her blood pressure under better control.  CMP and urine ACR ordered to recheck her potassium and kidney function. Start low-dose valsartan  as directed.  Monitor BP over the next few days and send results through MyChart.  Plan to slowly titrate dose if needed and  tolerated.  Recommend BP cuff for the arm, explained that it is probably getting very tight because her BP is so high.  A machine was ordered through her pharmacy. Follow-up with cardiology on 5/7 as planned. Return if symptoms worsen or fail to improve.

## 2023-07-22 ENCOUNTER — Encounter: Payer: Self-pay | Admitting: Nurse Practitioner

## 2023-07-22 DIAGNOSIS — I10 Essential (primary) hypertension: Secondary | ICD-10-CM | POA: Insufficient documentation

## 2023-07-22 LAB — COMPREHENSIVE METABOLIC PANEL WITH GFR
ALT: 23 IU/L (ref 0–32)
AST: 20 IU/L (ref 0–40)
Albumin: 3.9 g/dL (ref 3.8–4.8)
Alkaline Phosphatase: 115 IU/L (ref 44–121)
BUN/Creatinine Ratio: 16 (ref 12–28)
BUN: 23 mg/dL (ref 8–27)
Bilirubin Total: 0.4 mg/dL (ref 0.0–1.2)
CO2: 19 mmol/L — ABNORMAL LOW (ref 20–29)
Calcium: 9.2 mg/dL (ref 8.7–10.3)
Chloride: 107 mmol/L — ABNORMAL HIGH (ref 96–106)
Creatinine, Ser: 1.41 mg/dL — ABNORMAL HIGH (ref 0.57–1.00)
Globulin, Total: 3.3 g/dL (ref 1.5–4.5)
Glucose: 124 mg/dL — ABNORMAL HIGH (ref 70–99)
Potassium: 3.7 mmol/L (ref 3.5–5.2)
Sodium: 143 mmol/L (ref 134–144)
Total Protein: 7.2 g/dL (ref 6.0–8.5)
eGFR: 38 mL/min/{1.73_m2} — ABNORMAL LOW (ref >59)

## 2023-07-22 MED ORDER — RELION BLOOD PRESSURE MONITOR KIT: PACK | 0 refills | Status: DC

## 2023-07-24 LAB — URINE CULTURE

## 2023-07-24 LAB — MICROALBUMIN / CREATININE URINE RATIO
Creatinine, Urine: 16.5 mg/dL
Microalb/Creat Ratio: 68 mg/g{creat} — ABNORMAL HIGH (ref 0–29)
Microalbumin, Urine: 11.3 ug/mL

## 2023-07-24 LAB — SPECIMEN STATUS REPORT

## 2023-07-25 ENCOUNTER — Other Ambulatory Visit: Payer: Self-pay | Admitting: Family Medicine

## 2023-07-25 ENCOUNTER — Encounter: Payer: Self-pay | Admitting: Family Medicine

## 2023-07-27 DIAGNOSIS — G4733 Obstructive sleep apnea (adult) (pediatric): Secondary | ICD-10-CM | POA: Diagnosis not present

## 2023-08-03 ENCOUNTER — Encounter: Payer: Self-pay | Admitting: Emergency Medicine

## 2023-08-03 ENCOUNTER — Other Ambulatory Visit: Payer: Self-pay | Admitting: Family Medicine

## 2023-08-03 ENCOUNTER — Other Ambulatory Visit: Payer: Self-pay | Admitting: "Endocrinology

## 2023-08-03 ENCOUNTER — Ambulatory Visit: Attending: Emergency Medicine | Admitting: Emergency Medicine

## 2023-08-03 VITALS — BP 156/96 | HR 82 | Ht 59.0 in | Wt 226.0 lb

## 2023-08-03 DIAGNOSIS — I493 Ventricular premature depolarization: Secondary | ICD-10-CM

## 2023-08-03 DIAGNOSIS — I5030 Unspecified diastolic (congestive) heart failure: Secondary | ICD-10-CM

## 2023-08-03 DIAGNOSIS — R001 Bradycardia, unspecified: Secondary | ICD-10-CM | POA: Diagnosis not present

## 2023-08-03 DIAGNOSIS — I5189 Other ill-defined heart diseases: Secondary | ICD-10-CM

## 2023-08-03 DIAGNOSIS — I517 Cardiomegaly: Secondary | ICD-10-CM | POA: Diagnosis not present

## 2023-08-03 DIAGNOSIS — E1122 Type 2 diabetes mellitus with diabetic chronic kidney disease: Secondary | ICD-10-CM

## 2023-08-03 DIAGNOSIS — I10 Essential (primary) hypertension: Secondary | ICD-10-CM

## 2023-08-03 MED ORDER — VALSARTAN 160 MG PO TABS: 160.0000 mg | ORAL_TABLET | Freq: Every day | ORAL | 1 refills | Status: DC

## 2023-08-03 NOTE — Progress Notes (Signed)
 Cardiology Office Note:    Date:  08/04/2023  ID:  Kristen Mack, DOB 1946-02-24, MRN [id number] PCP: Bennet Brasil, MD  [facility] Providers Cardiologist:  Oneil Bigness, MD       Patient Profile:      Chief Complaint: 1 year follow-up History of Present Illness:  Kristen Mack is a 78 y.o. female with visit-pertinent history of PVC, bradycardia, hyperlipidemia, hypertension, type 2 diabetes, hyperthyroidism, CKD stage III, CVA, OSA  Seen in November 2021 for bradycardia.  Found to have PVCs.  Monitor showed 22% burden.  Echocardiogram was normal at that time.  Hyperthyroid was contributing, medication management been adjusted.  She was started on diltiazem .  She was found to be bradycardic, this was suspected to be PVC related.  She was seen in clinic on 12/18/2021 by Aleta Anda, NP.  Her blood pressure was elevated 170/92.  She was started on spironolactone  12.5 mg daily.  She was last seen in clinic on 04/22/2022.  She was doing well at that time however did have some increased fatigue.  She was asked to make sure she follows up with sleep specialist regarding sleep apnea treatment.  Her TSH was normal.  She was to follow-up in 1 year.  Seen by PCP on 03/17/2023.  Patient noted to to have some DOE.  Echocardiogram 04/14/2023 showed LVEF 55 to 60%, no RWMA, moderate LVH, grade 1 DD, RV systolic function normal, left atrial size mildly dilated, trivial MR.  She was recently seen by her PCP on 07/21/2023.  She was found to have blood pressure of 194/98.  She was started on valsartan  80 mg daily.   Discussed the use of AI scribe software for clinical note transcription with the patient, who gave verbal consent to proceed.  History of Present Illness Kristen Mack is a 78 year old female with a history of PVCs, who presents for a one-year cardiology follow-up with her daughter.  Today patient is without any acute cardiovascular concerns or complaints.  She notes  that she has been doing well overall.  She notes that she had a fall on March 21 which resulted in an arm fracture.  She notes CT scan of the head was without acute abnormalities however did show severe chronic small vessel ischemic disease that she has a follow-up with neurology scheduled.   She notes that she still does experience some fatigue however this has been stable over the past year.  She does experience some mild dyspnea when she overexerts herself which has been unchanged and stable over several years.  She has chronic lower extremity edema related to venous insufficiency.  She notes that her lower extremity swelling has been stable but no recent exacerbations.  She is without any chest pains or exertional symptoms.  She denies any symptoms of recurrent PVCs and denies any palpitations or irregular heartbeats.  She has been dealing with elevated blood pressures over the last several months.  She was recently started on valsartan  however has not been able to take her blood pressure at home just yet.  She plans to get a new blood pressure cuff soon because the one she has is a wrist cuff and not accurate.  We reviewed her most recent echocardiogram from January.  She denies chest pain, fatigue, palpitations, melena, hematuria, hemoptysis, diaphoresis, weakness, presyncope, syncope, orthopnea, and PND.  Review of systems:  Please see the history of present illness. All other systems are reviewed and otherwise negative.  Home Medications:    Current Meds  Medication Sig   acetaminophen  (TYLENOL ) 500 MG tablet Take 500 mg by mouth every 6 (six) hours as needed.   albuterol  (PROVENTIL ) (2.5 MG/3ML) 0.083% nebulizer solution Take 3 mLs (2.5 mg total) by nebulization every 6 (six) hours as needed for wheezing or shortness of breath.   albuterol  (VENTOLIN  HFA) 108 (90 Base) MCG/ACT inhaler Inhale 2 puffs into the lungs every 4 (four) hours as needed. INHALE 2 PUFFS INTO LUNGS EVERY 6  HOURS AS NEEDED FOR SHORTNESS OF BREATH   Azelastine  HCl 137 MCG/SPRAY SOLN 2 sprays each nostrils BID   B Complex Vitamins (B COMPLEX 100 PO) Take 1 tablet by mouth daily.   blood glucose meter kit and supplies Dispense based on patient and insurance preference. Use to test sugars BID  (FOR ICD-10 E10.9, E11.9).   buPROPion  (WELLBUTRIN  XL) 300 MG 24 hr tablet Take 1 tablet by mouth once daily   Chromium Picolinate 1000 MCG TABS Take 1 tablet by mouth daily.   Coenzyme Q10 (COQ10) 200 MG CAPS Take 200 mg by mouth daily.   Continuous Glucose Receiver (FREESTYLE LIBRE 3 READER) DEVI 1 Piece by Does not apply route once as needed for up to 1 dose.   Continuous Glucose Sensor (FREESTYLE LIBRE 3 PLUS SENSOR) MISC Change sensor every 15 days.   Cyanocobalamin (B-12) 1000 MCG SUBL Place 1,000 mcg under the tongue daily.   denosumab  (PROLIA ) 60 MG/ML SOSY injection INJECT 60 MG SUBCUTANEOUSLY ONCE FOR 1 DOSE   DIGESTIVE ENZYMES PO Take 1 tablet by mouth 3 (three) times daily.    FREESTYLE PRECISION NEO TEST test strip USE 1 STRIP TO CHECK GLUCOSE 4 TIMES DAILY AS DIRECTED   furosemide  (LASIX ) 20 MG tablet 1 qam prn pedal edema   gabapentin  (NEURONTIN ) 100 MG capsule Take 1 capsule by mouth twice daily   levothyroxine  (SYNTHROID ) 125 MCG tablet TAKE 1 TABLET BY MOUTH ONCE DAILY BEFORE BREAKFAST   Lysine HCl 500 MG TABS Take 500 mg by mouth daily.   Magnesium  250 MG TABS Take 250 mg by mouth 2 (two) times daily.   meclizine (ANTIVERT) 25 MG tablet Take 25 mg by mouth 3 (three) times daily as needed for dizziness.   mirabegron  ER (MYRBETRIQ ) 25 MG TB24 tablet Take 1 tablet (25 mg total) by mouth daily.   montelukast  (SINGULAIR ) 10 MG tablet TAKE 1 TABLET BY MOUTH AT BEDTIME   OVER THE COUNTER MEDICATION Take 1 Scoop by mouth 3 (three) times daily with meals. Fennel seeds   OVER THE COUNTER MEDICATION Choline and inisotol   rosuvastatin  (CRESTOR ) 5 MG tablet TAKE 1 TABLET BY MOUTH ON MONDAY, WEDNESDAY,   FRIDAY AND SATURDAY   spironolactone  (ALDACTONE ) 25 MG tablet Take 1 tablet (25 mg total) by mouth daily.   TART CHERRY PO Take 2 tablets by mouth 3 (three) times daily.   TRESIBA FLEXTOUCH 100 UNIT/ML FlexTouch Pen Inject 46 Units into the skin at bedtime.   valsartan  (DIOVAN ) 160 MG tablet Take 1 tablet (160 mg total) by mouth daily.   Vitamin A 3 MG (10000 UT) TABS Take 10,000 Units by mouth daily.   vitamin E 180 MG (400 UNITS) capsule Take 800 Units by mouth daily.    [DISCONTINUED] valsartan  (DIOVAN ) 80 MG tablet Take 1 tablet (80 mg total) by mouth daily. For BP and kidneys   Studies Reviewed:   EKG Interpretation Date/Time:  Wednesday Aug 03 2023 14:53:06 EDT Ventricular Rate:  82 PR  Interval:  190 QRS Duration:  92 QT Interval:  402 QTC Calculation: 469 R Axis:   -34  Text Interpretation: Normal sinus rhythm Left axis deviation Left ventricular hypertrophy with repolarization abnormality ( R in aVL , Cornell product ) Confirmed by Palmer Bobo [phone]) on 08/04/2023 12:15:20 AM    Echocardiogram 04/14/2023 1. Left ventricular ejection fraction, by estimation, is 55 to 60%. The  left ventricle has normal function. The left ventricle has no regional  wall motion abnormalities. There is moderate concentric left ventricular  hypertrophy. Left ventricular  diastolic parameters are consistent with Grade I diastolic dysfunction  (impaired relaxation). Elevated left atrial pressure.   2. Right ventricular systolic function is normal. The right ventricular  size is normal.   3. Left atrial size was mildly dilated.   4. Trivial mitral valve regurgitation.   5. The aortic valve is tricuspid. Aortic valve regurgitation is not  visualized.   6. The inferior vena cava is normal in size with greater than 50%  respiratory variability, suggesting right atrial pressure of 3 mmHg.   Risk Assessment/Calculations:     HYPERTENSION CONTROL Vitals:   08/03/23 1500 08/03/23 1543  BP: (!)  120/100 (!) 156/96    The patient's blood pressure is elevated above target today.  In order to address the patient's elevated BP: A current anti-hypertensive medication was adjusted today.          Physical Exam:   VS:  BP (!) 156/96 (BP Location: Left Arm, Patient Position: Sitting, Cuff Size: Normal)   Pulse 82   Ht 4\' 11"  (1.499 m)   Wt 226 lb (102.5 kg)   SpO2 97%   BMI 45.65 kg/m    Wt Readings from Last 3 Encounters:  08/03/23 226 lb (102.5 kg)  07/21/23 228 lb 6.4 oz (103.6 kg)  06/20/23 216 lb 0.8 oz (98 kg)    GEN: Well nourished, well developed in no acute distress NECK: No JVD; No carotid bruits CARDIAC: RRR, no murmurs, rubs, gallops RESPIRATORY:  Clear to auscultation without rales, wheezing or rhonchi  ABDOMEN: Soft, non-tender, non-distended EXTREMITIES: 1+ bilateral lower extremity edema; No acute deformity     Assessment and Plan:  PVCs Bradycardia She was diagnosed with frequent PVCs in the setting of iatrogenic hyperthyroidism.  Since her thyroid  medication has been adjusted.  Now on TSH has been stable. - EKG today shows NSR without PVCs - Most recent echocardiogram 03/2023 with normal LVEF 55 to 60% -No signs of bradycardia today.  Her HR is 82 bpm.  Bradycardia suspected to be related to PVCs in the past - Continue to clinically monitor - Hyperthyroidism is managed by primary care provider  Hypertension Blood pressure today is 156/96 She was recently started on valsartan  80 mg by her PCP Does not seem she has had much benefit in blood pressure control at this moment - Plan to increase valsartan  from 80 mg to 160 mg daily - She is scheduled to see PCP in 2 weeks for blood pressure check where a BMET can be ordered at that time - Continue spironolactone  25 mg daily  Left ventricular hypertrophy / Diastolic dysfunction Echocardiogram 03/2023 showed moderate concentric left ventricular hypertrophy and grade 1 diastolic dysfunction - Today patient is  euvolemic and well compensated on exam - Well-maintained on Lasix  20 mg daily as needed      Dispo:  Return in about 1 year (around 08/02/2024).  Signed, Kristen Boatman, NP

## 2023-08-03 NOTE — Patient Instructions (Signed)
 Medication Instructions:  INCREASE YOUR VALSARTAN  TO 160 MG DAILY.   Lab Work: HAVE LABS REPEATED BY YOUR PCP.  Testing/Procedures: NONE  Follow-Up: At [facility], you and your health needs are our priority.  As part of our continuing mission to provide you with exceptional heart care, our providers are all part of one team.  This team includes your primary Cardiologist (physician) and Advanced Practice Providers or APPs (Physician Assistants and Nurse Practitioners) who all work together to provide you with the care you need, when you need it.  Your next appointment:   1 YEAR  Provider:   Oneil Bigness, MD

## 2023-08-04 ENCOUNTER — Encounter: Payer: Self-pay | Admitting: Emergency Medicine

## 2023-08-06 ENCOUNTER — Encounter: Payer: Self-pay | Admitting: Nurse Practitioner

## 2023-08-08 ENCOUNTER — Other Ambulatory Visit: Payer: Self-pay | Admitting: Nurse Practitioner

## 2023-08-08 MED ORDER — CEPHALEXIN 500 MG PO CAPS
500.0000 mg | ORAL_CAPSULE | Freq: Three times a day (TID) | ORAL | 0 refills | Status: DC
Start: 2023-08-08 — End: 2023-08-17

## 2023-08-10 ENCOUNTER — Encounter: Payer: Self-pay | Admitting: Nurse Practitioner

## 2023-08-11 ENCOUNTER — Other Ambulatory Visit: Payer: Self-pay | Admitting: Nurse Practitioner

## 2023-08-11 DIAGNOSIS — N1832 Chronic kidney disease, stage 3b: Secondary | ICD-10-CM | POA: Diagnosis not present

## 2023-08-11 DIAGNOSIS — N2581 Secondary hyperparathyroidism of renal origin: Secondary | ICD-10-CM | POA: Diagnosis not present

## 2023-08-11 DIAGNOSIS — G4733 Obstructive sleep apnea (adult) (pediatric): Secondary | ICD-10-CM | POA: Diagnosis not present

## 2023-08-11 DIAGNOSIS — I10 Essential (primary) hypertension: Secondary | ICD-10-CM

## 2023-08-11 DIAGNOSIS — R350 Frequency of micturition: Secondary | ICD-10-CM | POA: Diagnosis not present

## 2023-08-11 DIAGNOSIS — E1122 Type 2 diabetes mellitus with diabetic chronic kidney disease: Secondary | ICD-10-CM | POA: Diagnosis not present

## 2023-08-11 DIAGNOSIS — I129 Hypertensive chronic kidney disease with stage 1 through stage 4 chronic kidney disease, or unspecified chronic kidney disease: Secondary | ICD-10-CM | POA: Diagnosis not present

## 2023-08-11 DIAGNOSIS — K219 Gastro-esophageal reflux disease without esophagitis: Secondary | ICD-10-CM | POA: Diagnosis not present

## 2023-08-11 DIAGNOSIS — K7581 Nonalcoholic steatohepatitis (NASH): Secondary | ICD-10-CM | POA: Diagnosis not present

## 2023-08-11 MED ORDER — RELION BLOOD PRESSURE MONITOR KIT: PACK | 0 refills | Status: AC

## 2023-08-12 DIAGNOSIS — E1122 Type 2 diabetes mellitus with diabetic chronic kidney disease: Secondary | ICD-10-CM | POA: Diagnosis not present

## 2023-08-12 LAB — LAB REPORT - SCANNED: EGFR: 34

## 2023-08-15 ENCOUNTER — Other Ambulatory Visit: Payer: Self-pay | Admitting: Family Medicine

## 2023-08-16 ENCOUNTER — Other Ambulatory Visit: Payer: Self-pay

## 2023-08-16 DIAGNOSIS — E119 Type 2 diabetes mellitus without complications: Secondary | ICD-10-CM

## 2023-08-16 MED ORDER — SPIRONOLACTONE 25 MG PO TABS: 25.0000 mg | ORAL_TABLET | Freq: Every day | ORAL | 3 refills | Status: DC

## 2023-08-17 ENCOUNTER — Encounter: Payer: Self-pay | Admitting: Family Medicine

## 2023-08-17 ENCOUNTER — Ambulatory Visit (INDEPENDENT_AMBULATORY_CARE_PROVIDER_SITE_OTHER): Admitting: Family Medicine

## 2023-08-17 VITALS — BP 132/86 | HR 91 | Temp 97.3°F | Ht 59.0 in | Wt 226.0 lb

## 2023-08-17 DIAGNOSIS — I10 Essential (primary) hypertension: Secondary | ICD-10-CM

## 2023-08-17 DIAGNOSIS — R8290 Unspecified abnormal findings in urine: Secondary | ICD-10-CM

## 2023-08-17 DIAGNOSIS — N1832 Chronic kidney disease, stage 3b: Secondary | ICD-10-CM

## 2023-08-17 DIAGNOSIS — E782 Mixed hyperlipidemia: Secondary | ICD-10-CM | POA: Diagnosis not present

## 2023-08-17 MED ORDER — FUROSEMIDE 20 MG PO TABS: ORAL_TABLET | ORAL | 5 refills | Status: DC

## 2023-08-17 MED ORDER — MONTELUKAST SODIUM 10 MG PO TABS: 10.0000 mg | ORAL_TABLET | Freq: Every day | ORAL | 3 refills | Status: AC

## 2023-08-17 MED ORDER — LEVOTHYROXINE SODIUM 125 MCG PO TABS: 125.0000 ug | ORAL_TABLET | Freq: Every day | ORAL | 3 refills | Status: DC

## 2023-08-17 MED ORDER — ROSUVASTATIN CALCIUM 5 MG PO TABS
ORAL_TABLET | ORAL | 5 refills | Status: DC
Start: 2023-08-17 — End: 2024-01-03

## 2023-08-17 MED ORDER — BUPROPION HCL ER (XL) 300 MG PO TB24: 300.0000 mg | ORAL_TABLET | Freq: Every day | ORAL | 1 refills | Status: DC

## 2023-08-17 NOTE — Progress Notes (Addendum)
 Subjective:    Patient ID: Kristen Mack, female    DOB: [birth year], 78 y.o.   MRN: [id number]  HPI  Follow up hypertension currently taking spironolactone  25 mg 1 tab every day and valsartan  160 mg 1qd Discussed the use of AI scribe software for clinical note transcription with the patient, who gave verbal consent to proceed.  History of Present Illness   Kristen Mack is a 78 year old female with chronic kidney disease and hypertension who presents for a follow-up visit.  She experiences difficulty breathing, particularly in humid conditions, which worsens with movement, impacting her ability to move around comfortably.  Her arm, previously fractured, aches after exercise, affecting her daily activities. She is supposed to wear a sling when going out but did not wear it to the appointment as she felt it was not necessary.  She manages her blood pressure with valsartan , which she has been taking for two weeks. She is in the process of obtaining a blood pressure machine, as her insurance would not cover the cost of one from [facility]. She has concerns about the accuracy of wrist monitors.  Recent kidney function tests showed a creatinine level of 1.5, sodium at 142, and potassium at 4.5. Her phosphorus level was slightly elevated. She had a follow-up with a kidney specialist on the 15th of the month, where her creatinine level was noted to be 1.55, with similar sodium and potassium levels.  She uses loratadine and a nasal spray for allergy relief.     Past Medical History:  Diagnosis Date   ADHD (attention deficit hyperactivity disorder) 2007   Adrenal adenoma, left    followed by dr nida   Arthritis    Asthma    followed by pcp   CFS (chronic fatigue syndrome)    CKD (chronic kidney disease), stage III [facility])    nephrologist---  dr marlee   Depression    Edema of both lower extremities    Fatty liver disease, nonalcoholic    Fibromyalgia    GERD  (gastroesophageal reflux disease)    Hiatal hernia    History of adenomatous polyp of colon    History of cardiac murmur    until age 78- pt has had echocardiograms   History of TIA (transient ischemic attack)    per CT scan    Hyperlipidemia    Hypertension    Hypothyroidism    endocrinologist-- dr g. nida   IBS (irritable bowel syndrome)    Meniere disease    OSA on CPAP    per pt uses nightly   Osteopenia    Pancreas cyst    followed by pcp   PMB (postmenopausal bleeding)    PVC's (premature ventricular contractions)    cardiologist--- dr barbaraann   Type 2 diabetes mellitus treated with insulin  [facility])    endocrinologist-- dr g. nida;    (08-14-2021  per pt checks blood sugar QID ,  fasting average --- 73-150)   Urinary incontinence    Wears glasses    Wears partial dentures    upper    Review of Systems     Objective:   Physical Exam  General-in no acute distress Eyes-no discharge Lungs-respiratory rate normal, CTA CV-no murmurs,RRR Extremities skin warm dry some edema is noted in the lower legs and feet Neuro grossly normal Behavior normal, alert Foot exam normal Lab work from her kidney specialist was reviewed     Assessment & Plan:  Assessment and Plan  Chronic Kidney Disease Chronic kidney disease with creatinine level of 1.55, consistent with valsartan  use. Slightly elevated phosphorus level. Emphasized managing blood pressure and blood sugar to prevent further kidney decline. Provided dietary guidance for phosphorus management. - Continue valsartan  as prescribed. - Provide dietary guidance on phosphorus intake, categorizing foods into low, medium, and high phosphorus content. - Monitor kidney function with labs closer to the next visit in October. - Schedule follow-up with nephrologist in six months.  Hypertension Hypertension managed with valsartan . Blood pressure readings are well-controlled. Discussed accurate blood pressure monitoring and  limitations of wrist blood pressure cuffs. - Continue valsartan  as prescribed. - Advise against using decongestants like Alavert D due to potential blood pressure elevation. - Recommend bringing blood pressure cuff to next visit for accuracy check.  Urinary Tract Infection Recent UTI treated with cefalexin. Discussed asymptomatic bacteriuria in older adults and treatment criteria. - Order urine specimen in three weeks to check for persistent infection.  Arm Pain Post-Fracture Arm pain post-fracture with ongoing physical therapy. Awaiting further instructions from orthopedic specialist regarding weight-bearing activities. - Continue physical therapy as advised. - Wear sling when necessary. - Follow up with orthopedic specialist for further instructions.  Allergic Rhinitis Allergic rhinitis with occasional need for antihistamines. Advised against decongestants due to potential blood pressure elevation. - Use loratadine or fexofenadine as needed for allergy symptoms. - Use nasal spray as needed. - Avoid decongestants like Alavert D.

## 2023-08-18 ENCOUNTER — Other Ambulatory Visit (INDEPENDENT_AMBULATORY_CARE_PROVIDER_SITE_OTHER): Payer: Self-pay

## 2023-08-18 ENCOUNTER — Ambulatory Visit (INDEPENDENT_AMBULATORY_CARE_PROVIDER_SITE_OTHER): Admitting: Orthopedic Surgery

## 2023-08-18 ENCOUNTER — Other Ambulatory Visit: Payer: Self-pay

## 2023-08-18 DIAGNOSIS — N390 Urinary tract infection, site not specified: Secondary | ICD-10-CM

## 2023-08-18 DIAGNOSIS — S42294D Other nondisplaced fracture of upper end of right humerus, subsequent encounter for fracture with routine healing: Secondary | ICD-10-CM

## 2023-08-18 NOTE — Progress Notes (Signed)
 FRACTURE CARE NOTE    Chief Complaint  Patient presents with   Shoulder Pain    Right    Encounter Diagnosis  Name Primary?   Other closed nondisplaced fracture of proximal end of right humerus with routine healing, subsequent encounter 06/17/23 Yes    DOI/DOS/ Date: 06/17/23  Improved

## 2023-08-18 NOTE — Progress Notes (Signed)
   There were no vitals taken for this visit.  There is no height or weight on file to calculate BMI.  Chief Complaint  Patient presents with   Shoulder Pain    Right    Encounter Diagnosis  Name Primary?   Other closed nondisplaced fracture of proximal end of right humerus with routine healing, subsequent encounter 06/17/23 Yes    DOI/DOS/ Date: 06/17/23  Improved

## 2023-08-18 NOTE — Progress Notes (Signed)
 FRACTURE CARE FOLLOW UP   Patient: Kristen Mack           Date of Birth: Aug 12, 1945           MRN: [id number] Visit Date: 08/18/2023 Requested by: Bennet Brasil, MD [address] [place],  [place] [address] PCP: Bennet Brasil, MD  Encounter Diagnosis  Name Primary?   Other closed nondisplaced fracture of proximal end of right humerus with routine healing, subsequent encounter 06/17/23 Yes    Chief Complaint  Patient presents with   Shoulder Pain    Right   78 year old female doing well range of motion is improving pain is well-controlled patient ready to advance with physical therapy  No results found.  X-rays today show healing fracture right proximal humerus with no significant displacement or angulation  TREATMENT PLAN   Physical therapy advance as tolerated weight-bear as tolerated no restrictions at this time  Follow-up in 6 to 8 weeks

## 2023-08-23 ENCOUNTER — Other Ambulatory Visit: Payer: Self-pay | Admitting: "Endocrinology

## 2023-08-23 DIAGNOSIS — N183 Chronic kidney disease, stage 3 unspecified: Secondary | ICD-10-CM

## 2023-08-24 ENCOUNTER — Ambulatory Visit: Payer: PPO | Admitting: Family Medicine

## 2023-08-25 ENCOUNTER — Other Ambulatory Visit: Payer: Self-pay

## 2023-08-25 MED ORDER — ALBUTEROL SULFATE HFA 108 (90 BASE) MCG/ACT IN AERS: 2.0000 | INHALATION_SPRAY | RESPIRATORY_TRACT | 0 refills | Status: DC | PRN

## 2023-09-05 ENCOUNTER — Encounter: Payer: Self-pay | Admitting: Nurse Practitioner

## 2023-09-06 ENCOUNTER — Other Ambulatory Visit: Payer: Self-pay | Admitting: Nurse Practitioner

## 2023-09-06 MED ORDER — CEPHALEXIN 500 MG PO CAPS: 500.0000 mg | ORAL_CAPSULE | Freq: Three times a day (TID) | ORAL | 0 refills | Status: DC

## 2023-09-07 DIAGNOSIS — N390 Urinary tract infection, site not specified: Secondary | ICD-10-CM | POA: Diagnosis not present

## 2023-09-08 LAB — MICROSCOPIC EXAMINATION
Casts: NONE SEEN /LPF
RBC, Urine: NONE SEEN /HPF (ref 0–2)
WBC, UA: >30 /HPF — AB (ref 0–5)

## 2023-09-08 LAB — URINALYSIS, COMPLETE
Bilirubin, UA: NEGATIVE
Glucose, UA: NEGATIVE
Ketones, UA: NEGATIVE
Nitrite, UA: NEGATIVE
Protein,UA: NEGATIVE
RBC, UA: NEGATIVE
Specific Gravity, UA: 1.01 (ref 1.005–1.030)
Urobilinogen, Ur: 0.2 mg/dL (ref 0.2–1.0)
pH, UA: 6 (ref 5.0–7.5)

## 2023-09-10 ENCOUNTER — Ambulatory Visit: Payer: Self-pay | Admitting: Family Medicine

## 2023-09-10 LAB — URINE CULTURE

## 2023-09-12 DIAGNOSIS — E1122 Type 2 diabetes mellitus with diabetic chronic kidney disease: Secondary | ICD-10-CM | POA: Diagnosis not present

## 2023-09-23 ENCOUNTER — Encounter: Payer: Self-pay | Admitting: Nurse Practitioner

## 2023-10-06 ENCOUNTER — Encounter: Payer: Self-pay | Admitting: Orthopedic Surgery

## 2023-10-06 ENCOUNTER — Ambulatory Visit (INDEPENDENT_AMBULATORY_CARE_PROVIDER_SITE_OTHER): Admitting: Orthopedic Surgery

## 2023-10-06 DIAGNOSIS — S42294D Other nondisplaced fracture of upper end of right humerus, subsequent encounter for fracture with routine healing: Secondary | ICD-10-CM | POA: Diagnosis not present

## 2023-10-06 NOTE — Progress Notes (Signed)
   There were no vitals taken for this visit.  There is no height or weight on file to calculate BMI.  Chief Complaint  Patient presents with   Shoulder Injury    Right / 06/17/23    Encounter Diagnosis  Name Primary?   Other closed nondisplaced fracture of proximal end of right humerus with routine healing, subsequent encounter 06/17/23 Yes    DOI/DOS/ Date:    Was improving/ d/c from OT when she could raise arm above head, now has lost motion and feels more weak, states she has exercises but still has declined since she has finished therapy

## 2023-10-06 NOTE — Patient Instructions (Signed)
 Exercises for the shoulder as instructed

## 2023-10-06 NOTE — Progress Notes (Signed)
   Chief Complaint  Patient presents with   Shoulder Injury    Right / 06/17/23    Encounter Diagnosis  Name Primary?   Other closed nondisplaced fracture of proximal end of right humerus with routine healing, subsequent encounter 06/17/23 Yes    DOI/DOS/ Date:    Was improving/ d/c from OT when she could raise arm above head, now has lost motion and feels more weak, states she has exercises but still has declined since she has finished therapy   Scapular plane flexion is 110 degrees  Recommend home exercise program  Patient advised that therapy and exercise will give her the best result she is diligent with those her range of motion should improve  Return as needed as the fracture has healed

## 2023-10-10 ENCOUNTER — Other Ambulatory Visit: Payer: Self-pay | Admitting: "Endocrinology

## 2023-10-10 DIAGNOSIS — N183 Chronic kidney disease, stage 3 unspecified: Secondary | ICD-10-CM

## 2023-10-10 NOTE — Addendum Note (Signed)
 Addended by: MARGRETTE BARS E on: 10/10/2023 11:10 AM   Modules accepted: Level of Service

## 2023-10-11 ENCOUNTER — Other Ambulatory Visit: Payer: Self-pay

## 2023-10-11 ENCOUNTER — Encounter: Payer: Self-pay | Admitting: "Endocrinology

## 2023-10-11 DIAGNOSIS — E1122 Type 2 diabetes mellitus with diabetic chronic kidney disease: Secondary | ICD-10-CM

## 2023-10-11 MED ORDER — TRESIBA FLEXTOUCH 100 UNIT/ML ~~LOC~~ SOPN: 46.0000 [IU] | PEN_INJECTOR | Freq: Every day | SUBCUTANEOUS | 0 refills | Status: DC

## 2023-10-12 DIAGNOSIS — E1122 Type 2 diabetes mellitus with diabetic chronic kidney disease: Secondary | ICD-10-CM | POA: Diagnosis not present

## 2023-10-25 ENCOUNTER — Encounter: Payer: Self-pay | Admitting: Neurology

## 2023-10-25 ENCOUNTER — Ambulatory Visit: Admitting: Neurology

## 2023-10-25 VITALS — BP 142/84 | HR 91 | Ht 59.0 in | Wt 228.0 lb

## 2023-10-25 DIAGNOSIS — I6782 Cerebral ischemia: Secondary | ICD-10-CM | POA: Diagnosis not present

## 2023-10-25 NOTE — Patient Instructions (Signed)
 Continue current medications  Start Aspirin 81 mg daily  Continue to follow up with your PCP  Return as needed

## 2023-10-25 NOTE — Progress Notes (Unsigned)
 [facility]  PATIENT: Kristen Mack DOB: 12-Sep-1945  REQUESTING CLINICIAN: Alphonsa Glendia LABOR, MD HISTORY FROM: Patient/Daughter  REASON FOR VISIT: Abnormal head CT   HISTORICAL  CHIEF COMPLAINT:  Chief Complaint  Patient presents with   New Patient (Initial Visit)    Room 12 New patient abnormal CT, pt stated that she she is sure if she had issues  with memory , family is concerns with patient memory , she  has pain in her right shoulder and arm from a fall in march.    HISTORY OF PRESENT ILLNESS:  Discussed the use of AI scribe software for clinical note transcription with the patient, who gave verbal consent to proceed.  Kristen Mack is a 78 year old female with migraines and hypertension who was referred for abnormal CT head. She is currently complaining of shoulder pain and limited mobility following shoulder fracture from a fall.   She describes her shoulder as 'frozen up' again, causing significant pain and limiting her ability to perform daily activities independently. She requires considerable assistance due to these limitations. She has previously undergone physical and occupational therapy, which she found not very effective, and has since been discontinued. She performs exercises at home about 50% of the time but notes a regression in her ability to raise her arm fully, which she could do previously. Pain occurs when laying her arm back down after raising it, persisting for a few minutes. Being right-handed, the shoulder issue affects her ability to perform tasks such as writing and dressing, requiring assistance from her daughter.  She has a history of migraines, which have been present for most of her life. She also has a history of high blood pressure and high cholesterol, for which she takes statins on a Monday, Wednesday, Friday, and Saturday schedule. Her cholesterol levels were last checked on Aug 20, 2022, showing an LDL of 59, HDL of  42, and total cholesterol of 134. She monitors her blood pressure at home, with recent readings around 130/80 and 140/84, noting that pain can elevate her readings.  She has concerns about memory and potential early dementia but reports no significant memory issues affecting her daily living. She has a history of vascular risk factors, including high blood pressure and past smoking, which may contribute to small vessel disease seen on imaging. CT scan shows white matter spots, and the patient recalls being told these may be related to small vessel disease.  She has a history of smoking but has quit. She also has digestive problems and a history of ulcers, liver issues, and kidney issues, which influence her decision not to take baby aspirin despite having small vessel disease noted on a CT scan.    OTHER MEDICAL CONDITIONS: Hypertension, Hyperlipidemia, Migraines   REVIEW OF SYSTEMS: Full 14 system review of systems performed and negative with exception of: As noted in the HPI   ALLERGIES: Allergies  Allergen Reactions   Latex Other (See Comments) and Itching    skin gets raw   Ambien [Zolpidem Tartrate] Other (See Comments)    Side effect lost a couple days; memory loss   Cefzil  [Cefprozil ] Diarrhea   Citalopram      Other reaction(s): Not available   Codeine Itching and Nausea And Vomiting   Demerol  [Meperidine ] Other (See Comments)    messes up senses   Formaldehyde    Sulfa Antibiotics Other (See Comments)    Severe abd cramp   Zolpidem     Other reaction(s):  Not available   Celexa  [Citalopram  Hydrobromide] Other (See Comments)    Mouth sores    HOME MEDICATIONS: Outpatient Medications Prior to Visit  Medication Sig Dispense Refill   acetaminophen  (TYLENOL ) 500 MG tablet Take 500 mg by mouth every 6 (six) hours as needed.     albuterol  (PROVENTIL ) (2.5 MG/3ML) 0.083% nebulizer solution Take 3 mLs (2.5 mg total) by nebulization every 6 (six) hours as needed for wheezing  or shortness of breath. 75 mL 12   albuterol  (VENTOLIN  HFA) 108 (90 Base) MCG/ACT inhaler Inhale 2 puffs into the lungs every 4 (four) hours as needed. INHALE 2 PUFFS INTO LUNGS EVERY 6 HOURS AS NEEDED FOR SHORTNESS OF BREATH 18 g 0   Azelastine  HCl 137 MCG/SPRAY SOLN 2 sprays each nostrils BID 30 mL 5   B Complex Vitamins (B COMPLEX 100 PO) Take 1 tablet by mouth daily.     blood glucose meter kit and supplies Dispense based on patient and insurance preference. Use to test sugars BID  (FOR ICD-10 E10.9, E11.9). 1 each 0   Blood Pressure Monitoring (RELION BLOOD PRESSURE MONITOR) KIT Use daily to monitor BP 1 kit 0   buPROPion  (WELLBUTRIN  XL) 300 MG 24 hr tablet Take 1 tablet (300 mg total) by mouth daily. 90 tablet 1   Chromium Picolinate 1000 MCG TABS Take 1 tablet by mouth daily.     Coenzyme Q10 (COQ10) 200 MG CAPS Take 200 mg by mouth daily.     Continuous Glucose Receiver (FREESTYLE LIBRE 3 READER) DEVI 1 Piece by Does not apply route once as needed for up to 1 dose. 1 each 0   Continuous Glucose Sensor (FREESTYLE LIBRE 3 PLUS SENSOR) MISC Change sensor every 15 days. 2 each 2   Cyanocobalamin (B-12) 1000 MCG SUBL Place 1,000 mcg under the tongue daily.     denosumab  (PROLIA ) 60 MG/ML SOSY injection INJECT 60 MG SUBCUTANEOUSLY ONCE FOR 1 DOSE 1 mL 1   DIGESTIVE ENZYMES PO Take 1 tablet by mouth 3 (three) times daily.      FREESTYLE PRECISION NEO TEST test strip USE 1 STRIP TO CHECK GLUCOSE 4 TIMES DAILY AS DIRECTED 100 each 0   furosemide  (LASIX ) 20 MG tablet TAKE 1 TABLET BY MOUTH ONCE DAILY IN THE MORNING AS NEEDED PEDAL EDEMA 30 tablet 5   gabapentin  (NEURONTIN ) 100 MG capsule Take 1 capsule by mouth twice daily 60 capsule 5   levothyroxine  (SYNTHROID ) 125 MCG tablet Take 1 tablet (125 mcg total) by mouth daily before breakfast. 90 tablet 3   Lysine HCl 500 MG TABS Take 500 mg by mouth daily.     Magnesium  250 MG TABS Take 250 mg by mouth 2 (two) times daily.     meclizine (ANTIVERT) 25  MG tablet Take 25 mg by mouth 3 (three) times daily as needed for dizziness.     mirabegron  ER (MYRBETRIQ ) 25 MG TB24 tablet Take 1 tablet (25 mg total) by mouth daily. 30 tablet 11   montelukast  (SINGULAIR ) 10 MG tablet Take 1 tablet (10 mg total) by mouth at bedtime. 90 tablet 3   OVER THE COUNTER MEDICATION Take 1 Scoop by mouth 3 (three) times daily with meals. Fennel seeds     OVER THE COUNTER MEDICATION Choline and inisotol     oxyCODONE  (ROXICODONE ) 5 MG immediate release tablet Take 1 tablet (5 mg total) by mouth every 4 (four) hours as needed for breakthrough pain. 20 tablet 0   oxyCODONE -acetaminophen  (PERCOCET/ROXICET) 5-325 MG tablet  Take 1 tablet by mouth every 4 (four) hours as needed for severe pain (pain score 7-10). 6 tablet 0   RELION PEN NEEDLES 31G X 6 MM MISC USE 1 PEN NEEDLE ONCE DAILY TO INJECT INSULIN  100 each 0   rosuvastatin  (CRESTOR ) 5 MG tablet TAKE 1 TABLET BY MOUTH ON MONDAY, WEDNESDAY,  FRIDAY AND SATURDAY 18 tablet 5   spironolactone  (ALDACTONE ) 25 MG tablet Take 1 tablet (25 mg total) by mouth daily. 30 tablet 3   TART CHERRY PO Take 2 tablets by mouth 3 (three) times daily.     TRESIBA  FLEXTOUCH 100 UNIT/ML FlexTouch Pen Inject 46 Units into the skin at bedtime. 45 mL 0   valsartan  (DIOVAN ) 160 MG tablet Take 1 tablet (160 mg total) by mouth daily. 90 tablet 1   Vitamin A 3 MG (10000 UT) TABS Take 10,000 Units by mouth daily.     vitamin E 180 MG (400 UNITS) capsule Take 800 Units by mouth daily.      Facility-Administered Medications Prior to Visit  Medication Dose Route Frequency Provider Last Rate Last Admin   gentamicin  (GARAMYCIN ) 330 mg in dextrose  5 % 100 mL IVPB  330 mg Intravenous Q24H Casimir Camelia RAMAN, RPH   330 mg at 08/17/21 1442    PAST MEDICAL HISTORY: Past Medical History:  Diagnosis Date   ADHD (attention deficit hyperactivity disorder) 2007   Adrenal adenoma, left    followed by dr nida   Arthritis    Asthma    followed by pcp   CFS  (chronic fatigue syndrome)    CKD (chronic kidney disease), stage III [facility])    nephrologist---  dr marlee   Depression    Edema of both lower extremities    Fatty liver disease, nonalcoholic    Fibromyalgia    GERD (gastroesophageal reflux disease)    Hiatal hernia    History of adenomatous polyp of colon    History of cardiac murmur    until age 24- pt has had echocardiograms   History of TIA (transient ischemic attack)    per CT scan    Hyperlipidemia    Hypertension    Hypothyroidism    endocrinologist-- dr g. nida   IBS (irritable bowel syndrome)    Meniere disease    OSA on CPAP    per pt uses nightly   Osteopenia    Pancreas cyst    followed by pcp   PMB (postmenopausal bleeding)    PVC's (premature ventricular contractions)    cardiologist--- dr barbaraann   Type 2 diabetes mellitus treated with insulin  [facility])    endocrinologist-- dr g. nida;    (08-14-2021  per pt checks blood sugar QID ,  fasting average --- 73-150)   Urinary incontinence    Wears glasses    Wears partial dentures    upper    PAST SURGICAL HISTORY: Past Surgical History:  Procedure Laterality Date   CERVICAL POLYPECTOMY N/A 08/17/2021   Procedure: BIOPSY OF ENDOMETRIUM AND OR POLYPECTOMY;  Surgeon: Bettina Muskrat, MD;  Location: [facility];  Service: Gynecology;  Laterality: N/A;   CESAREAN SECTION  1977  &  1979   CHOLECYSTECTOMY  1995   and  REPAIR UMBILICAL HERNIA   COLONOSCOPY N/A 04/19/2013   Procedure: COLONOSCOPY;  Surgeon: Claudis RAYMOND Rivet, MD;  [facility];  Service: Endoscopy;  Laterality: N/A;     DILITATION & CURRETTAGE/HYSTROSCOPY WITH NOVASURE ABLATION N/A 08/17/2021   Procedure: DILATATION & CURETTAGE/HYSTEROSCOPY;  Surgeon: Bettina Muskrat, MD;  [facility];  Service: Gynecology;  Laterality: N/A;   ESOPHAGOGASTRODUODENOSCOPY  02/08/2003   HERNIA REPAIR  1995   HYSTEROSCOPY WITH D & C  11/14/2002   endometrial polypectomy    HYSTEROSCOPY WITH D & C N/A 04/05/2014   Procedure: DILATATION AND CURETTAGE /HYSTEROSCOPY, ENDOMETRIAL POLYPECTOMY;  Surgeon: Muskrat Bettina, MD;  Location: [facility];  Service: Gynecology;  Laterality: N/A;   LIVER BIOPSY  01/29/2004   benign   TUBAL LIGATION  1979    FAMILY HISTORY: Family History  Problem Relation Age of Onset   Rectal cancer Brother 53   Diabetes Brother    Ovarian cancer Mother 24   Hypertension Mother    Diabetes Mother    Cancer Mother    Depression Mother    Early death Mother    Obesity Mother    Hypertension Father    Diabetes Father    Depression Father    Hyperlipidemia Father    Stroke Father    Breast cancer Maternal Aunt    Breast cancer Paternal Aunt        four Paternal aunts   Heart disease Paternal Grandfather    Hyperlipidemia Paternal Grandfather    Hypertension Paternal Grandfather    Stroke Paternal Grandfather    Liver disease Sister        x 2; fatty liver   Hearing loss Maternal Grandfather    Hypertension Maternal Grandfather    Heart disease Maternal Grandmother    Hypertension Maternal Grandmother    Obesity Maternal Grandmother    Obesity Paternal Grandmother    ADD / ADHD Daughter    Depression Daughter    ADD / ADHD Daughter    Depression Daughter    Alcohol abuse Maternal Uncle    Depression Maternal Uncle    Drug abuse Maternal Uncle    Early death Maternal Uncle    Alcohol abuse Paternal Uncle    Depression Paternal Uncle    Drug abuse Paternal Uncle    Anxiety disorder Sister    Depression Sister    Hyperlipidemia Sister    Miscarriages / Stillbirths Sister    Cancer Brother    Depression Brother    Early death Brother    Cancer Maternal Aunt    Cancer Maternal Aunt    Cancer Paternal Aunt    Cancer Paternal Aunt    Cancer Paternal Aunt    Cancer Paternal Aunt    Drug abuse Brother    Heart disease Maternal Uncle    Hyperlipidemia Maternal Uncle    Hypertension Maternal Uncle     Hyperlipidemia Sister    Hyperlipidemia Brother    Hyperlipidemia Brother    Hypertension Maternal Uncle    Pancreatic cancer Neg Hx    Esophageal cancer Neg Hx     SOCIAL HISTORY: Social History   Socioeconomic History   Marital status: Married    Spouse name: Lamar   Number of children: 2   Years of education: Not on file   Highest education level: Associate degree: occupational, Scientist, product/process development, or vocational program  Occupational History   Occupation: retired, Charity fundraiser  Tobacco Use   Smoking status: Former    Current packs/day: 0.00    Average packs/day: 1.5 packs/day for 3.0 years (4.5 ttl pk-yrs)    Types: Cigarettes    Start date: 10/03/1965    Quit date: 10/03/1968    Years since quitting: 55.0    Passive exposure: Past  Smokeless tobacco: Never  Vaping Use   Vaping status: Never Used  Substance and Sexual Activity   Alcohol use: No   Drug use: No   Sexual activity: Not Currently    Birth control/protection: None  Other Topics Concern   Not on file  Social History Narrative   2 daughters, Amy and Rosaline. Amy lives with parents. Rosaline lives in [place].   Social Drivers of Corporate investment banker Strain: Low Risk  (04/24/2023)   Overall Financial Resource Strain (CARDIA)    Difficulty of Paying Living Expenses: Not very hard  Recent Concern: Financial Resource Strain - Medium Risk (01/25/2023)   Overall Financial Resource Strain (CARDIA)    Difficulty of Paying Living Expenses: Somewhat hard  Food Insecurity: Food Insecurity Present (04/24/2023)   Hunger Vital Sign    Worried About Running Out of Food in the Last Year: Sometimes true    Ran Out of Food in the Last Year: Never true  Transportation Needs: No Transportation Needs (04/24/2023)   PRAPARE - Administrator, Civil Service (Medical): No    Lack of Transportation (Non-Medical): No  Physical Activity: Unknown (04/24/2023)   Exercise Vital Sign    Days of Exercise per Week: 0 days    Minutes  of Exercise per Session: Patient declined  Stress: No Stress Concern Present (04/24/2023)   [employer] of Occupational Health - Occupational Stress Questionnaire    Feeling of Stress : Not at all  Social Connections: Moderately Isolated (04/24/2023)   Social Connection and Isolation Panel    Frequency of Communication with Friends and Family: More than three times a week    Frequency of Social Gatherings with Friends and Family: Once a week    Attends Religious Services: Never    Database administrator or Organizations: No    Attends Banker Meetings: Never    Marital Status: Married  Catering manager Violence: Not At Risk (01/14/2023)   Humiliation, Afraid, Rape, and Kick questionnaire    Fear of Current or Ex-Partner: No    Emotionally Abused: No    Physically Abused: No    Sexually Abused: No    PHYSICAL EXAM   GENERAL EXAM/CONSTITUTIONAL: Vitals:  Vitals:   10/25/23 1408  BP: (!) 142/84  Pulse: 91  SpO2: 94%  Weight: 228 lb (103.4 kg)  Height: 4' 11 (1.499 m)   Body mass index is 46.05 kg/m. Wt Readings from Last 3 Encounters:  10/25/23 228 lb (103.4 kg)  08/17/23 226 lb (102.5 kg)  08/03/23 226 lb (102.5 kg)   Patient is in no distress; well developed, nourished and groomed; neck is supple  MUSCULOSKELETAL: Gait, strength, tone, movements noted in Neurologic exam below  NEUROLOGIC: MENTAL STATUS:     10/25/2023    2:28 PM  MMSE - Mini Mental State Exam  Orientation to time 5  Orientation to Place 5  Registration 3  Attention/ Calculation 5  Recall 3  Language- name 2 objects 2  Language- repeat 1  Language- follow 3 step command 3  Language- read & follow direction 1  Write a sentence 1  Copy design 1  Total score 30   awake, alert, oriented to person, place and time recent and remote memory intact normal attention and concentration language fluent, comprehension intact, naming intact fund of knowledge appropriate  CRANIAL  NERVE:  2nd, 3rd, 4th, 6th- visual fields full to confrontation, extraocular muscles intact, no nystagmus 5th - facial sensation symmetric 7th -  facial strength symmetric 8th - hearing intact 9th - palate elevates symmetrically, uvula midline 11th - shoulder shrug symmetric 12th - tongue protrusion midline  MOTOR:  normal bulk and tone, full strength in the BUE, BLE. LUE is limited due to shoulder pain  SENSORY:  normal and symmetric to light touch  COORDINATION:  finger-nose-finger, fine finger movements normal  GAIT/STATION:  normal    DIAGNOSTIC DATA (LABS, IMAGING, TESTING) - I reviewed patient records, labs, notes, testing and imaging myself where available.  Lab Results  Component Value Date   WBC 9.5 06/20/2023   HGB 12.3 06/20/2023   HCT 38.6 06/20/2023   MCV 88.9 06/20/2023   PLT 323 06/20/2023      Component Value Date/Time   NA 143 07/21/2023 1650   K 3.7 07/21/2023 1650   CL 107 (H) 07/21/2023 1650   CO2 19 (L) 07/21/2023 1650   GLUCOSE 124 (H) 07/21/2023 1650   GLUCOSE 140 (H) 06/20/2023 2242   BUN 23 07/21/2023 1650   CREATININE 1.41 (H) 07/21/2023 1650   CREATININE 0.67 05/07/2014 1122   CALCIUM  9.2 07/21/2023 1650   PROT 7.2 07/21/2023 1650   ALBUMIN  3.9 07/21/2023 1650   AST 20 07/21/2023 1650   ALT 23 07/21/2023 1650   ALKPHOS 115 07/21/2023 1650   BILITOT 0.4 07/21/2023 1650   GFRNONAA 33 (L) 06/20/2023 2242   GFRAA 30 (L) 02/20/2020 1509   Lab Results  Component Value Date   CHOL 134 08/24/2022   HDL 42 08/24/2022   LDLCALC 59 08/24/2022   TRIG 198 (H) 08/24/2022   CHOLHDL 3.2 08/24/2022   Lab Results  Component Value Date   HGBA1C 6.2 06/28/2023   No results found for: VITAMINB12 Lab Results  Component Value Date   TSH 0.309 (L) 12/13/2022    MRI Brain 06/17/2023 1. No evidence of acute intracranial abnormality or cervical spine fracture. 2. Severe chronic small vessel ischemic disease.     ASSESSMENT AND PLAN  78  y.o. year old female with history including hypertension, hyperlipidemia, Hypothyroidism, here for abnormal CT head.   Small vessel cerebrovascular disease Presence of white matter disease on CT scan, indicative of small vessel cerebrovascular disease. Nonspecific findings, common in aging and associated with vascular risk factors such as hypertension and hyperlipidemia. No current evidence of dementia. Discussed the potential risk of TIA or mini-stroke due to advanced changes. She expressed concerns about taking baby aspirin due to gastrointestinal issues and past ulcer history.  - Start baby aspirin 81 mg daily - Monitor for gastrointestinal symptoms   Right shoulder pain and contracture with limited range of motion Right shoulder pain and contracture with limited range of motion. Pain exacerbated by certain movements, such as lowering the arm. Previous physical and occupational therapy discontinued as deemed sufficient. Exercises performed at home 50% of the time, advised to increase to 100%. - Increase home exercise regimen to 100%  Hypertension Blood pressure readings generally within acceptable range, with occasional elevations due to pain. Monitoring at home advised. - Continue home blood pressure monitoring    1. Ischemic changes on computed tomography of head     Patient Instructions  Continue current medications  Start Aspirin 81 mg daily  Continue to follow up with your PCP  Return as needed   No orders of the defined types were placed in this encounter.   No orders of the defined types were placed in this encounter.   Return if symptoms worsen or fail to improve.  Pastor Falling, MD 10/26/2023, 1:48 PM  [facility] [address], [address] [place], [place] [address] [phone]

## 2023-11-12 DIAGNOSIS — E1122 Type 2 diabetes mellitus with diabetic chronic kidney disease: Secondary | ICD-10-CM | POA: Diagnosis not present

## 2023-11-16 ENCOUNTER — Other Ambulatory Visit: Payer: Self-pay | Admitting: Family Medicine

## 2023-11-24 DIAGNOSIS — D259 Leiomyoma of uterus, unspecified: Secondary | ICD-10-CM | POA: Diagnosis not present

## 2023-11-29 ENCOUNTER — Other Ambulatory Visit: Payer: Self-pay | Admitting: Obstetrics and Gynecology

## 2023-11-29 DIAGNOSIS — N950 Postmenopausal bleeding: Secondary | ICD-10-CM | POA: Diagnosis not present

## 2023-11-29 DIAGNOSIS — N719 Inflammatory disease of uterus, unspecified: Secondary | ICD-10-CM | POA: Diagnosis not present

## 2023-11-29 DIAGNOSIS — Z1211 Encounter for screening for malignant neoplasm of colon: Secondary | ICD-10-CM | POA: Diagnosis not present

## 2023-11-29 DIAGNOSIS — Z87440 Personal history of urinary (tract) infections: Secondary | ICD-10-CM | POA: Diagnosis not present

## 2023-11-29 HISTORY — PX: ENDOMETRIAL BIOPSY: SHX622

## 2023-12-01 LAB — SURGICAL PATHOLOGY

## 2023-12-07 ENCOUNTER — Other Ambulatory Visit: Payer: Self-pay | Admitting: "Endocrinology

## 2023-12-07 ENCOUNTER — Ambulatory Visit: Admitting: Urology

## 2023-12-07 ENCOUNTER — Encounter: Payer: Self-pay | Admitting: Urology

## 2023-12-07 DIAGNOSIS — R350 Frequency of micturition: Secondary | ICD-10-CM

## 2023-12-07 DIAGNOSIS — N390 Urinary tract infection, site not specified: Secondary | ICD-10-CM | POA: Diagnosis not present

## 2023-12-07 DIAGNOSIS — E119 Type 2 diabetes mellitus without complications: Secondary | ICD-10-CM

## 2023-12-07 LAB — URINALYSIS, ROUTINE W REFLEX MICROSCOPIC
Bilirubin, UA: NEGATIVE
Glucose, UA: NEGATIVE
Ketones, UA: NEGATIVE
Nitrite, UA: NEGATIVE
Protein,UA: NEGATIVE
RBC, UA: NEGATIVE
Specific Gravity, UA: 1.02 (ref 1.005–1.030)
Urobilinogen, Ur: 0.2 mg/dL (ref 0.2–1.0)
pH, UA: 6 (ref 5.0–7.5)

## 2023-12-07 LAB — MICROSCOPIC EXAMINATION: WBC, UA: >30 /HPF — AB (ref 0–5)

## 2023-12-07 MED ORDER — MIRABEGRON ER 25 MG PO TB24: 25.0000 mg | ORAL_TABLET | Freq: Every day | ORAL | 11 refills | Status: AC

## 2023-12-07 MED ORDER — NITROFURANTOIN MONOHYD MACRO 100 MG PO CAPS: 100.0000 mg | ORAL_CAPSULE | Freq: Two times a day (BID) | ORAL | 0 refills | Status: AC

## 2023-12-07 MED ORDER — NITROFURANTOIN MACROCRYSTAL 50 MG PO CAPS: 50.0000 mg | ORAL_CAPSULE | Freq: Every day | ORAL | 11 refills | Status: AC

## 2023-12-07 NOTE — Progress Notes (Signed)
 12/07/2023 3:24 PM   Kristen Mack March 25, 1946 [id number]  Referring provider: Alphonsa Glendia LABOR, MD [address] [place],  [place] [address]  Followup urinary frequency and frequent UTI   HPI: Kristen Mack is a 78yo here for followup for frequent UTI and urge incontinence. Her urgency and incontinence has significantly improved on mirabegron . UA today is concerning for infection. She had 3-4 UTIs in the past 6 months. She previously had keflex  which caused vaginal yeast.    PMH: Past Medical History:  Diagnosis Date   ADHD (attention deficit hyperactivity disorder) 2007   Adrenal adenoma, left    followed by dr nida   Arthritis    Asthma    followed by pcp   CFS (chronic fatigue syndrome)    CKD (chronic kidney disease), stage III [facility])    nephrologist---  dr sanford   Depression    Edema of both lower extremities    Fatty liver disease, nonalcoholic    Fibromyalgia    GERD (gastroesophageal reflux disease)    Hiatal hernia    History of adenomatous polyp of colon    History of cardiac murmur    until age 61- pt has had echocardiograms   History of TIA (transient ischemic attack)    per CT scan    Hyperlipidemia    Hypertension    Hypothyroidism    endocrinologist-- dr g. nida   IBS (irritable bowel syndrome)    Meniere disease    OSA on CPAP    per pt uses nightly   Osteopenia    Pancreas cyst    followed by pcp   PMB (postmenopausal bleeding)    PVC's (premature ventricular contractions)    cardiologist--- dr barbaraann   Type 2 diabetes mellitus treated with insulin  [facility])    endocrinologist-- dr g. nida;    (08-14-2021  per pt checks blood sugar QID ,  fasting average --- 73-150)   Urinary incontinence    Wears glasses    Wears partial dentures    upper    Surgical History: Past Surgical History:  Procedure Laterality Date   CERVICAL POLYPECTOMY N/A 08/17/2021   Procedure: BIOPSY OF ENDOMETRIUM AND OR POLYPECTOMY;  Surgeon: Bettina Muskrat,  MD;  Location: [facility];  Service: Gynecology;  Laterality: N/A;   CESAREAN SECTION  1977  &  1979   CHOLECYSTECTOMY  1995   and  REPAIR UMBILICAL HERNIA   COLONOSCOPY N/A 04/19/2013   Procedure: COLONOSCOPY;  Surgeon: Claudis RAYMOND Rivet, MD;  [facility];  Service: Endoscopy;  Laterality: N/A;     DILITATION & CURRETTAGE/HYSTROSCOPY WITH NOVASURE ABLATION N/A 08/17/2021   Procedure: DILATATION & CURETTAGE/HYSTEROSCOPY;  Surgeon: Bettina Muskrat, MD;  Location: [facility];  Service: Gynecology;  Laterality: N/A;   ESOPHAGOGASTRODUODENOSCOPY  02/08/2003   HERNIA REPAIR  1995   HYSTEROSCOPY WITH D & C  11/14/2002   endometrial polypectomy   HYSTEROSCOPY WITH D & C N/A 04/05/2014   Procedure: DILATATION AND CURETTAGE /HYSTEROSCOPY, ENDOMETRIAL POLYPECTOMY;  Surgeon: Muskrat Bettina, MD;  Location: [facility];  Service: Gynecology;  Laterality: N/A;   LIVER BIOPSY  01/29/2004   benign   TUBAL LIGATION  1979    Home Medications:  Allergies as of 12/07/2023       Reactions   Latex Other (See Comments), Itching   skin gets raw   Ambien [zolpidem Tartrate] Other (See Comments)   Side effect lost a couple days; memory loss  Cefzil  [cefprozil ] Diarrhea   Citalopram     Other reaction(s): Not available   Codeine Itching, Nausea And Vomiting   Demerol  [meperidine ] Other (See Comments)   messes up senses   Formaldehyde    Sulfa Antibiotics Other (See Comments)   Severe abd cramp   Zolpidem    Other reaction(s): Not available   Celexa  [citalopram  Hydrobromide] Other (See Comments)   Mouth sores        Medication List        Accurate as of December 07, 2023  3:24 PM. If you have any questions, ask your nurse or doctor.          acetaminophen  500 MG tablet Commonly known as: TYLENOL  Take 500 mg by mouth every 6 (six) hours as needed.   albuterol  (2.5 MG/3ML) 0.083% nebulizer solution Commonly known as: PROVENTIL  Take 3  mLs (2.5 mg total) by nebulization every 6 (six) hours as needed for wheezing or shortness of breath.   albuterol  108 (90 Base) MCG/ACT inhaler Commonly known as: VENTOLIN  HFA Inhale 2 puffs into the lungs every 4 (four) hours as needed. INHALE 2 PUFFS INTO LUNGS EVERY 6 HOURS AS NEEDED FOR SHORTNESS OF BREATH   Azelastine  HCl 137 MCG/SPRAY Soln 2 sprays each nostrils BID   B COMPLEX 100 PO Take 1 tablet by mouth daily.   B-12 1000 MCG Subl Place 1,000 mcg under the tongue daily.   blood glucose meter kit and supplies Dispense based on patient and insurance preference. Use to test sugars BID  (FOR ICD-10 E10.9, E11.9).   buPROPion  300 MG 24 hr tablet Commonly known as: WELLBUTRIN  XL Take 1 tablet (300 mg total) by mouth daily.   Chromium Picolinate 1000 MCG Tabs Take 1 tablet by mouth daily.   CoQ10 200 MG Caps Take 200 mg by mouth daily.   DIGESTIVE ENZYMES PO Take 1 tablet by mouth 3 (three) times daily.   FreeStyle Libre 3 Plus Sensor Misc Change sensor every 15 days.   FreeStyle Libre 3 Reader Devi 1 Piece by Does not apply route once as needed for up to 1 dose.   FreeStyle Precision Neo Test test strip Generic drug: glucose blood USE 1 STRIP TO CHECK GLUCOSE 4 TIMES DAILY AS DIRECTED   furosemide  20 MG tablet Commonly known as: LASIX  TAKE 1 TABLET BY MOUTH ONCE DAILY IN THE MORNING AS NEEDED PEDAL EDEMA   gabapentin  100 MG capsule Commonly known as: NEURONTIN  Take 1 capsule by mouth twice daily   levothyroxine  125 MCG tablet Commonly known as: SYNTHROID  Take 1 tablet (125 mcg total) by mouth daily before breakfast.   Lysine HCl 500 MG Tabs Take 500 mg by mouth daily.   Magnesium  250 MG Tabs Take 250 mg by mouth 2 (two) times daily.   meclizine 25 MG tablet Commonly known as: ANTIVERT Take 25 mg by mouth 3 (three) times daily as needed for dizziness.   mirabegron  ER 25 MG Tb24 tablet Commonly known as: MYRBETRIQ  Take 1 tablet (25 mg total) by mouth  daily.   montelukast  10 MG tablet Commonly known as: SINGULAIR  Take 1 tablet (10 mg total) by mouth at bedtime.   OVER THE COUNTER MEDICATION Take 1 Scoop by mouth 3 (three) times daily with meals. Fennel seeds   OVER THE COUNTER MEDICATION Choline and inisotol   oxyCODONE  5 MG immediate release tablet Commonly known as: Roxicodone  Take 1 tablet (5 mg total) by mouth every 4 (four) hours as needed for breakthrough pain.  oxyCODONE -acetaminophen  5-325 MG tablet Commonly known as: PERCOCET/ROXICET Take 1 tablet by mouth every 4 (four) hours as needed for severe pain (pain score 7-10).   Prolia  60 MG/ML Sosy injection Generic drug: denosumab  INJECT 60 MG SUBCUTANEOUSLY ONCE FOR 1 DOSE   ReliOn Blood Pressure Monitor Kit Use daily to monitor BP   ReliOn Pen Needles 31G X 6 MM Misc Generic drug: Insulin  Pen Needle USE 1 PEN NEEDLE ONCE DAILY TO INJECT INSULIN    rosuvastatin  5 MG tablet Commonly known as: CRESTOR  TAKE 1 TABLET BY MOUTH ON MONDAY, WEDNESDAY,  FRIDAY AND SATURDAY   spironolactone  25 MG tablet Commonly known as: ALDACTONE  Take 1 tablet (25 mg total) by mouth daily.   TART CHERRY PO Take 2 tablets by mouth 3 (three) times daily.   Tresiba  FlexTouch 100 UNIT/ML FlexTouch Pen Generic drug: insulin  degludec Inject 46 Units into the skin at bedtime.   valsartan  160 MG tablet Commonly known as: DIOVAN  Take 1 tablet (160 mg total) by mouth daily.   Vitamin A 3 MG (10000 UT) Tabs Take 10,000 Units by mouth daily.   vitamin E 180 MG (400 UNITS) capsule Take 800 Units by mouth daily.        Allergies:  Allergies  Allergen Reactions   Latex Other (See Comments) and Itching    skin gets raw   Ambien [Zolpidem Tartrate] Other (See Comments)    Side effect lost a couple days; memory loss   Cefzil  [Cefprozil ] Diarrhea   Citalopram      Other reaction(s): Not available   Codeine Itching and Nausea And Vomiting   Demerol  [Meperidine ] Other (See  Comments)    messes up senses   Formaldehyde    Sulfa Antibiotics Other (See Comments)    Severe abd cramp   Zolpidem     Other reaction(s): Not available   Celexa  [Citalopram  Hydrobromide] Other (See Comments)    Mouth sores    Family History: Family History  Problem Relation Age of Onset   Rectal cancer Brother 19   Diabetes Brother    Ovarian cancer Mother 93   Hypertension Mother    Diabetes Mother    Cancer Mother    Depression Mother    Early death Mother    Obesity Mother    Hypertension Father    Diabetes Father    Depression Father    Hyperlipidemia Father    Stroke Father    Breast cancer Maternal Aunt    Breast cancer Paternal Aunt        four Paternal aunts   Heart disease Paternal Grandfather    Hyperlipidemia Paternal Grandfather    Hypertension Paternal Grandfather    Stroke Paternal Grandfather    Liver disease Sister        x 2; fatty liver   Hearing loss Maternal Grandfather    Hypertension Maternal Grandfather    Heart disease Maternal Grandmother    Hypertension Maternal Grandmother    Obesity Maternal Grandmother    Obesity Paternal Grandmother    ADD / ADHD Daughter    Depression Daughter    ADD / ADHD Daughter    Depression Daughter    Alcohol abuse Maternal Uncle    Depression Maternal Uncle    Drug abuse Maternal Uncle    Early death Maternal Uncle    Alcohol abuse Paternal Uncle    Depression Paternal Uncle    Drug abuse Paternal Uncle    Anxiety disorder Sister    Depression Sister  Hyperlipidemia Sister    Miscarriages / [place] Sister    Cancer Brother    Depression Brother    Early death Brother    Cancer Maternal Aunt    Cancer Maternal Aunt    Cancer Paternal Aunt    Cancer Paternal Aunt    Cancer Paternal Aunt    Cancer Paternal Aunt    Drug abuse Brother    Heart disease Maternal Uncle    Hyperlipidemia Maternal Uncle    Hypertension Maternal Uncle    Hyperlipidemia Sister    Hyperlipidemia Brother     Hyperlipidemia Brother    Hypertension Maternal Uncle    Pancreatic cancer Neg Hx    Esophageal cancer Neg Hx     Social History:  reports that she quit smoking about 55 years ago. Her smoking use included cigarettes. She started smoking about 58 years ago. She has a 4.5 pack-year smoking history. She has been exposed to tobacco smoke. She has never used smokeless tobacco. She reports that she does not drink alcohol and does not use drugs.  ROS: All other review of systems were reviewed and are negative except what is noted above in HPI  Physical Exam: There were no vitals taken for this visit.  Constitutional:  Alert and oriented, No acute distress. HEENT: [place] AT, moist mucus membranes.  Trachea midline, no masses. Cardiovascular: No clubbing, cyanosis, or edema. Respiratory: Normal respiratory effort, no increased work of breathing. GI: Abdomen is soft, nontender, nondistended, no abdominal masses GU: No CVA tenderness.  Lymph: No cervical or inguinal lymphadenopathy. Skin: No rashes, bruises or suspicious lesions. Neurologic: Grossly intact, no focal deficits, moving all 4 extremities. Psychiatric: Normal mood and affect.  Laboratory Data: Lab Results  Component Value Date   WBC 9.5 06/20/2023   HGB 12.3 06/20/2023   HCT 38.6 06/20/2023   MCV 88.9 06/20/2023   PLT 323 06/20/2023    Lab Results  Component Value Date   CREATININE 1.41 (H) 07/21/2023    No results found for: PSA  No results found for: TESTOSTERONE  Lab Results  Component Value Date   HGBA1C 6.2 06/28/2023    Urinalysis    Component Value Date/Time   COLORURINE STRAW (A) 12/11/2021 1826   APPEARANCEUR Clear 09/07/2023 1035   LABSPEC 1.003 (L) 12/11/2021 1826   PHURINE 6.0 12/11/2021 1826   GLUCOSEU Negative 09/07/2023 1035   HGBUR NEGATIVE 12/11/2021 1826   BILIRUBINUR Negative 09/07/2023 1035   KETONESUR negative 07/21/2023 1610   KETONESUR NEGATIVE 12/11/2021 1826   PROTEINUR Negative  09/07/2023 1035   PROTEINUR NEGATIVE 12/11/2021 1826   UROBILINOGEN 0.2 07/21/2023 1610   NITRITE Negative 09/07/2023 1035   NITRITE NEGATIVE 12/11/2021 1826   LEUKOCYTESUR 2+ (A) 09/07/2023 1035   LEUKOCYTESUR SMALL (A) 12/11/2021 1826    Lab Results  Component Value Date   LABMICR See below: 09/07/2023   WBCUA >30 (A) 09/07/2023   RBCUA 0-2 12/31/2016   LABEPIT 0-10 09/07/2023   MUCUS Present 12/31/2016   BACTERIA Many (A) 09/07/2023    Pertinent Imaging:  No results found for this or any previous visit.  No results found for this or any previous visit.  No results found for this or any previous visit.  No results found for this or any previous visit.  Results for orders placed during the hospital encounter of 09/18/19  US  RENAL  Narrative CLINICAL DATA:  Stage III B chronic renal disease.  EXAM: RENAL / URINARY TRACT ULTRASOUND COMPLETE  COMPARISON:  None.  FINDINGS: Right Kidney:  Renal measurements: 10.4 cm x 3.6 cm x 4.4 cm = volume: 84.5 mL . Echogenicity within normal limits. No mass or hydronephrosis visualized.  Left Kidney:  Renal measurements: 10.8 cm x 4.6 cm x 4.3 cm = volume: 111.2 mL. Echogenicity within normal limits. No mass or hydronephrosis visualized.  Bladder:  Appears normal for degree of bladder distention. Bilateral ureteral jets are seen.  Other:  None.  IMPRESSION: Normal bilateral renal ultrasound.   Electronically Signed By: Suzen Dials M.D. On: 09/19/2019 22:54  No results found for this or any previous visit.  No results found for this or any previous visit.  No results found for this or any previous visit.   Assessment & Plan:    1. Urinary frequency (Primary) Continue mirabegron  25mg  daily - Urinalysis, Routine w reflex microscopic  2. Frequent UTI Urine for culture -macrobid  100mg  BID for 7 days \-We will start macrodantin  50mg  qhs   No follow-ups on file.  Belvie Clara, MD  [facility] [place]

## 2023-12-07 NOTE — Patient Instructions (Signed)

## 2023-12-09 ENCOUNTER — Other Ambulatory Visit: Payer: Self-pay | Admitting: "Endocrinology

## 2023-12-09 DIAGNOSIS — E119 Type 2 diabetes mellitus without complications: Secondary | ICD-10-CM

## 2023-12-09 MED ORDER — SPIRONOLACTONE 25 MG PO TABS: 25.0000 mg | ORAL_TABLET | Freq: Every day | ORAL | 1 refills | Status: DC

## 2023-12-12 ENCOUNTER — Other Ambulatory Visit: Payer: Self-pay | Admitting: "Endocrinology

## 2023-12-12 LAB — URINE CULTURE

## 2023-12-13 ENCOUNTER — Ambulatory Visit: Payer: Self-pay

## 2023-12-13 DIAGNOSIS — E1122 Type 2 diabetes mellitus with diabetic chronic kidney disease: Secondary | ICD-10-CM | POA: Diagnosis not present

## 2023-12-14 DIAGNOSIS — N950 Postmenopausal bleeding: Secondary | ICD-10-CM | POA: Diagnosis not present

## 2023-12-16 LAB — COLOGUARD

## 2023-12-20 ENCOUNTER — Other Ambulatory Visit: Payer: Self-pay

## 2023-12-20 DIAGNOSIS — M810 Age-related osteoporosis without current pathological fracture: Secondary | ICD-10-CM

## 2023-12-20 MED ORDER — PROLIA 60 MG/ML ~~LOC~~ SOSY: 60.0000 mg | PREFILLED_SYRINGE | SUBCUTANEOUS | 0 refills | Status: DC

## 2023-12-22 ENCOUNTER — Other Ambulatory Visit: Payer: Self-pay | Admitting: "Endocrinology

## 2023-12-22 DIAGNOSIS — M810 Age-related osteoporosis without current pathological fracture: Secondary | ICD-10-CM

## 2023-12-26 ENCOUNTER — Other Ambulatory Visit: Payer: Self-pay | Admitting: "Endocrinology

## 2023-12-26 DIAGNOSIS — M810 Age-related osteoporosis without current pathological fracture: Secondary | ICD-10-CM

## 2023-12-27 ENCOUNTER — Other Ambulatory Visit: Payer: Self-pay

## 2023-12-27 DIAGNOSIS — E782 Mixed hyperlipidemia: Secondary | ICD-10-CM | POA: Diagnosis not present

## 2023-12-27 DIAGNOSIS — E038 Other specified hypothyroidism: Secondary | ICD-10-CM | POA: Diagnosis not present

## 2023-12-27 DIAGNOSIS — M810 Age-related osteoporosis without current pathological fracture: Secondary | ICD-10-CM

## 2023-12-27 MED ORDER — JUBBONTI 60 MG/ML ~~LOC~~ SOSY: 60.0000 mg | PREFILLED_SYRINGE | SUBCUTANEOUS | 0 refills | Status: AC

## 2023-12-28 ENCOUNTER — Ambulatory Visit: Admitting: "Endocrinology

## 2023-12-28 LAB — LIPID PANEL
Chol/HDL Ratio: 3.6 ratio (ref 0.0–4.4)
Cholesterol, Total: 142 mg/dL (ref 100–199)
HDL: 40 mg/dL (ref >39)
LDL Chol Calc (NIH): 80 mg/dL (ref 0–99)
Triglycerides: 123 mg/dL (ref 0–149)
VLDL Cholesterol Cal: 22 mg/dL (ref 5–40)

## 2023-12-28 LAB — T4, FREE: Free T4: 1.35 ng/dL (ref 0.82–1.77)

## 2023-12-28 LAB — TSH: TSH: 0.36 u[IU]/mL — ABNORMAL LOW (ref 0.450–4.500)

## 2024-01-02 DIAGNOSIS — Z1211 Encounter for screening for malignant neoplasm of colon: Secondary | ICD-10-CM | POA: Diagnosis not present

## 2024-01-03 ENCOUNTER — Encounter: Payer: Self-pay | Admitting: Family Medicine

## 2024-01-03 ENCOUNTER — Encounter: Payer: Self-pay | Admitting: "Endocrinology

## 2024-01-03 ENCOUNTER — Ambulatory Visit: Admitting: Family Medicine

## 2024-01-03 ENCOUNTER — Ambulatory Visit: Admitting: "Endocrinology

## 2024-01-03 VITALS — BP 138/70 | HR 67 | Ht 59.0 in | Wt 232.0 lb

## 2024-01-03 VITALS — BP 122/68 | HR 56 | Ht 59.0 in | Wt 234.2 lb

## 2024-01-03 DIAGNOSIS — E1122 Type 2 diabetes mellitus with diabetic chronic kidney disease: Secondary | ICD-10-CM | POA: Diagnosis not present

## 2024-01-03 DIAGNOSIS — N183 Chronic kidney disease, stage 3 unspecified: Secondary | ICD-10-CM

## 2024-01-03 DIAGNOSIS — F324 Major depressive disorder, single episode, in partial remission: Secondary | ICD-10-CM | POA: Diagnosis not present

## 2024-01-03 DIAGNOSIS — Z23 Encounter for immunization: Secondary | ICD-10-CM | POA: Diagnosis not present

## 2024-01-03 DIAGNOSIS — Z794 Long term (current) use of insulin: Secondary | ICD-10-CM

## 2024-01-03 DIAGNOSIS — M25561 Pain in right knee: Secondary | ICD-10-CM

## 2024-01-03 DIAGNOSIS — D3502 Benign neoplasm of left adrenal gland: Secondary | ICD-10-CM

## 2024-01-03 DIAGNOSIS — E782 Mixed hyperlipidemia: Secondary | ICD-10-CM

## 2024-01-03 DIAGNOSIS — M79652 Pain in left thigh: Secondary | ICD-10-CM

## 2024-01-03 DIAGNOSIS — M810 Age-related osteoporosis without current pathological fracture: Secondary | ICD-10-CM | POA: Diagnosis not present

## 2024-01-03 DIAGNOSIS — G8929 Other chronic pain: Secondary | ICD-10-CM

## 2024-01-03 DIAGNOSIS — I129 Hypertensive chronic kidney disease with stage 1 through stage 4 chronic kidney disease, or unspecified chronic kidney disease: Secondary | ICD-10-CM

## 2024-01-03 DIAGNOSIS — E038 Other specified hypothyroidism: Secondary | ICD-10-CM

## 2024-01-03 DIAGNOSIS — E1165 Type 2 diabetes mellitus with hyperglycemia: Secondary | ICD-10-CM | POA: Diagnosis not present

## 2024-01-03 LAB — POCT GLYCOSYLATED HEMOGLOBIN (HGB A1C)

## 2024-01-03 MED ORDER — FUROSEMIDE 20 MG PO TABS: ORAL_TABLET | ORAL | 5 refills | Status: AC

## 2024-01-03 MED ORDER — GABAPENTIN 100 MG PO CAPS: 100.0000 mg | ORAL_CAPSULE | Freq: Two times a day (BID) | ORAL | 5 refills | Status: AC

## 2024-01-03 MED ORDER — LEVOTHYROXINE SODIUM 125 MCG PO TABS: 125.0000 ug | ORAL_TABLET | Freq: Every day | ORAL | 3 refills | Status: AC

## 2024-01-03 MED ORDER — VALSARTAN 160 MG PO TABS: 160.0000 mg | ORAL_TABLET | Freq: Every day | ORAL | 1 refills | Status: AC

## 2024-01-03 MED ORDER — ROSUVASTATIN CALCIUM 5 MG PO TABS: ORAL_TABLET | ORAL | 5 refills | Status: AC

## 2024-01-03 MED ORDER — BUPROPION HCL ER (XL) 300 MG PO TB24: 300.0000 mg | ORAL_TABLET | Freq: Every day | ORAL | 1 refills | Status: AC

## 2024-01-03 MED ORDER — DENOSUMAB-BBDZ 60 MG/ML ~~LOC~~ SOSY
60.0000 mg | PREFILLED_SYRINGE | Freq: Once | SUBCUTANEOUS | Status: AC
  Administered 2024-01-03: 60 mg via SUBCUTANEOUS

## 2024-01-03 NOTE — Progress Notes (Signed)
 01/03/2024, 4:06 PM     Endocrinology follow-up note   Subjective:    Patient ID: Kristen Mack, female    DOB: [birth year], PCP Alphonsa Glendia LABOR, MD   Past Medical History:  Diagnosis Date   ADHD (attention deficit hyperactivity disorder) 2007   Adrenal adenoma, left    followed by dr Steffi Noviello   Arthritis    Asthma    followed by pcp   CFS (chronic fatigue syndrome)    CKD (chronic kidney disease), stage III [facility])    nephrologist---  dr marlee   Depression    Edema of both lower extremities    Fatty liver disease, nonalcoholic    Fibromyalgia    GERD (gastroesophageal reflux disease)    Hiatal hernia    History of adenomatous polyp of colon    History of cardiac murmur    until age 78- pt has had echocardiograms   History of TIA (transient ischemic attack)    per CT scan    Hyperlipidemia    Hypertension    Hypothyroidism    endocrinologist-- dr g. Ladean Steinmeyer   IBS (irritable bowel syndrome)    Meniere disease    OSA on CPAP    per pt uses nightly   Osteopenia    Pancreas cyst    followed by pcp   PMB (postmenopausal bleeding)    PVC's (premature ventricular contractions)    cardiologist--- dr barbaraann   Type 2 diabetes mellitus treated with insulin  [facility])    endocrinologist-- dr g. Quadasia Newsham;    (08-14-2021  per pt checks blood sugar QID ,  fasting average --- 73-150)   Urinary incontinence    Wears glasses    Wears partial dentures    upper   Past Surgical History:  Procedure Laterality Date   CERVICAL POLYPECTOMY N/A 08/17/2021   Procedure: BIOPSY OF ENDOMETRIUM AND OR POLYPECTOMY;  Surgeon: Bettina Muskrat, MD;  Location: [facility];  Service: Gynecology;  Laterality: N/A;   CESAREAN SECTION  1977  &  1979   CHOLECYSTECTOMY  1995   and  REPAIR UMBILICAL HERNIA   COLONOSCOPY N/A 04/19/2013   Procedure: COLONOSCOPY;  Surgeon: Claudis RAYMOND Rivet, MD;  [facility];  Service: Endoscopy;   Laterality: N/A;     DILITATION & CURRETTAGE/HYSTROSCOPY WITH NOVASURE ABLATION N/A 08/17/2021   Procedure: DILATATION & CURETTAGE/HYSTEROSCOPY;  Surgeon: Bettina Muskrat, MD;  Location: [facility];  Service: Gynecology;  Laterality: N/A;   ENDOMETRIAL BIOPSY  11/29/2023   ESOPHAGOGASTRODUODENOSCOPY  02/08/2003   HERNIA REPAIR  1995   HYSTEROSCOPY WITH D & C  11/14/2002   endometrial polypectomy   HYSTEROSCOPY WITH D & C N/A 04/05/2014   Procedure: DILATATION AND CURETTAGE /HYSTEROSCOPY, ENDOMETRIAL POLYPECTOMY;  Surgeon: Muskrat Bettina, MD;  Location: [facility];  Service: Gynecology;  Laterality: N/A;   LIVER BIOPSY  01/29/2004   benign   TUBAL LIGATION  1979   Social History   Socioeconomic History   Marital status: Married    Spouse name: Lamar   Number of children: 2   Years of education: Not on file   Highest education level: Associate degree: occupational, Scientist, product/process development, or vocational program  Occupational History  Occupation: retired, Charity fundraiser  Tobacco Use   Smoking status: Former    Current packs/day: 0.00    Average packs/day: 1.5 packs/day for 3.0 years (4.5 ttl pk-yrs)    Types: Cigarettes    Start date: 10/03/1965    Quit date: 10/03/1968    Years since quitting: 55.2    Passive exposure: Past   Smokeless tobacco: Never  Vaping Use   Vaping status: Never Used  Substance and Sexual Activity   Alcohol use: No   Drug use: No   Sexual activity: Not Currently    Birth control/protection: None  Other Topics Concern   Not on file  Social History Narrative   2 daughters, Amy and Rosaline. Amy lives with parents. Rosaline lives in [place].   Social Drivers of Corporate investment banker Strain: Low Risk  (04/24/2023)   Overall Financial Resource Strain (CARDIA)    Difficulty of Paying Living Expenses: Not very hard  Recent Concern: Financial Resource Strain - Medium Risk (01/25/2023)   Overall Financial Resource Strain (CARDIA)    Difficulty of  Paying Living Expenses: Somewhat hard  Food Insecurity: Food Insecurity Present (04/24/2023)   Hunger Vital Sign    Worried About Running Out of Food in the Last Year: Sometimes true    Ran Out of Food in the Last Year: Never true  Transportation Needs: No Transportation Needs (04/24/2023)   PRAPARE - Administrator, Civil Service (Medical): No    Lack of Transportation (Non-Medical): No  Physical Activity: Unknown (04/24/2023)   Exercise Vital Sign    Days of Exercise per Week: 0 days    Minutes of Exercise per Session: Patient declined  Stress: No Stress Concern Present (04/24/2023)   [employer] of Occupational Health - Occupational Stress Questionnaire    Feeling of Stress : Not at all  Social Connections: Moderately Isolated (04/24/2023)   Social Connection and Isolation Panel    Frequency of Communication with Friends and Family: More than three times a week    Frequency of Social Gatherings with Friends and Family: Once a week    Attends Religious Services: Never    Database administrator or Organizations: No    Attends Banker Meetings: Never    Marital Status: Married   Outpatient Encounter Medications as of 01/03/2024  Medication Sig   acetaminophen  (TYLENOL ) 500 MG tablet Take 500 mg by mouth every 6 (six) hours as needed.   albuterol  (PROVENTIL ) (2.5 MG/3ML) 0.083% nebulizer solution Take 3 mLs (2.5 mg total) by nebulization every 6 (six) hours as needed for wheezing or shortness of breath.   albuterol  (VENTOLIN  HFA) 108 (90 Base) MCG/ACT inhaler Inhale 2 puffs into the lungs every 4 (four) hours as needed. INHALE 2 PUFFS INTO LUNGS EVERY 6 HOURS AS NEEDED FOR SHORTNESS OF BREATH   Azelastine  HCl 137 MCG/SPRAY SOLN 2 sprays each nostrils BID   B Complex Vitamins (B COMPLEX 100 PO) Take 1 tablet by mouth daily.   blood glucose meter kit and supplies Dispense based on patient and insurance preference. Use to test sugars BID  (FOR ICD-10 E10.9, E11.9).    Blood Pressure Monitoring (RELION BLOOD PRESSURE MONITOR) KIT Use daily to monitor BP   buPROPion  (WELLBUTRIN  XL) 300 MG 24 hr tablet Take 1 tablet (300 mg total) by mouth daily.   Chromium Picolinate 1000 MCG TABS Take 1 tablet by mouth daily.   Coenzyme Q10 (COQ10) 200 MG CAPS Take 200 mg by mouth daily.  Continuous Glucose Receiver (FREESTYLE LIBRE 3 READER) DEVI 1 Piece by Does not apply route once as needed for up to 1 dose.   Continuous Glucose Sensor (FREESTYLE LIBRE 3 PLUS SENSOR) MISC Change sensor every 15 days.   Cyanocobalamin (B-12) 1000 MCG SUBL Place 1,000 mcg under the tongue daily.   denosumab -bbdz (JUBBONTI) 60 MG/ML SOSY Inject 60 mg into the skin every 6 (six) months.   DIGESTIVE ENZYMES PO Take 1 tablet by mouth 3 (three) times daily.    FREESTYLE PRECISION NEO TEST test strip USE 1 STRIP TO CHECK GLUCOSE 4 TIMES DAILY AS DIRECTED   furosemide  (LASIX ) 20 MG tablet TAKE 1 TABLET BY MOUTH ONCE DAILY IN THE MORNING AS NEEDED PEDAL EDEMA   gabapentin  (NEURONTIN ) 100 MG capsule Take 1 capsule by mouth twice daily   levothyroxine  (SYNTHROID ) 125 MCG tablet Take 1 tablet (125 mcg total) by mouth daily before breakfast.   Lysine HCl 500 MG TABS Take 500 mg by mouth daily.   Magnesium  250 MG TABS Take 250 mg by mouth 2 (two) times daily.   meclizine (ANTIVERT) 25 MG tablet Take 25 mg by mouth 3 (three) times daily as needed for dizziness.   mirabegron  ER (MYRBETRIQ ) 25 MG TB24 tablet Take 1 tablet (25 mg total) by mouth daily.   montelukast  (SINGULAIR ) 10 MG tablet Take 1 tablet (10 mg total) by mouth at bedtime.   nitrofurantoin  (MACRODANTIN ) 50 MG capsule Take 1 capsule (50 mg total) by mouth at bedtime.   nitrofurantoin , macrocrystal-monohydrate, (MACROBID ) 100 MG capsule Take 1 capsule (100 mg total) by mouth every 12 (twelve) hours.   OVER THE COUNTER MEDICATION Take 1 Scoop by mouth 3 (three) times daily with meals. Fennel seeds   OVER THE COUNTER MEDICATION Choline and  inisotol   oxyCODONE  (ROXICODONE ) 5 MG immediate release tablet Take 1 tablet (5 mg total) by mouth every 4 (four) hours as needed for breakthrough pain.   oxyCODONE -acetaminophen  (PERCOCET/ROXICET) 5-325 MG tablet Take 1 tablet by mouth every 4 (four) hours as needed for severe pain (pain score 7-10).   RELION PEN NEEDLES 31G X 6 MM MISC USE 1 PEN NEEDLE ONCE DAILY TO INJECT INSULIN    rosuvastatin  (CRESTOR ) 5 MG tablet TAKE 1 TABLET BY MOUTH ON MONDAY, WEDNESDAY,  FRIDAY AND SATURDAY   spironolactone  (ALDACTONE ) 25 MG tablet Take 1 tablet by mouth once daily   TART CHERRY PO Take 2 tablets by mouth 3 (three) times daily.   TRESIBA  FLEXTOUCH 100 UNIT/ML FlexTouch Pen Inject 46 Units into the skin at bedtime.   valsartan  (DIOVAN ) 160 MG tablet Take 1 tablet (160 mg total) by mouth daily.   Vitamin A 3 MG (10000 UT) TABS Take 10,000 Units by mouth daily.   vitamin E 180 MG (400 UNITS) capsule Take 800 Units by mouth daily.    [DISCONTINUED] spironolactone  (ALDACTONE ) 25 MG tablet Take 1 tablet (25 mg total) by mouth daily.   Facility-Administered Encounter Medications as of 01/03/2024  Medication   [COMPLETED] denosumab -bbdz SOSY 60 mg   denosumab -bbdz SOSY 60 mg   gentamicin  (GARAMYCIN ) 330 mg in dextrose  5 % 100 mL IVPB   ALLERGIES: Allergies  Allergen Reactions   Latex Other (See Comments) and Itching    skin gets raw   Ambien [Zolpidem Tartrate] Other (See Comments)    Side effect lost a couple days; memory loss   Cefzil  [Cefprozil ] Diarrhea   Citalopram      Other reaction(s): Not available   Codeine Itching and Nausea  And Vomiting   Demerol  [Meperidine ] Other (See Comments)    messes up senses   Formaldehyde    Sulfa Antibiotics Other (See Comments)    Severe abd cramp   Zolpidem     Other reaction(s): Not available   Celexa  [Citalopram  Hydrobromide] Other (See Comments)    Mouth sores    VACCINATION STATUS: Immunization History  Administered Date(s) Administered    Fluad Quad(high Dose 65+) 01/22/2020, 02/10/2021, 01/22/2022   Fluad Trivalent(High Dose 65+) 01/26/2023   INFLUENZA, HIGH DOSE SEASONAL PF 04/15/2018, 01/13/2019   Influenza,inj,Quad PF,6+ Mos 03/02/2016, 03/03/2017   Influenza-Unspecified 04/15/2018, 01/15/2019, 01/23/2020   Moderna Sars-Covid-2 Vaccination 05/23/2019, 06/19/2019, 03/05/2020   Pneumococcal Conjugate-13 05/22/2014   Pneumococcal Polysaccharide-23 03/03/2017   Tdap 04/18/2020   Zoster Recombinant(Shingrix ) 12/08/2018, 04/27/2019    HPI HARUKO MERSCH is 78 y.o. female who is accompanied by her daughter to clinic.  She usually follows in this clinic for stable, nonfunctioning 1.5 cm left adrenal adenoma which was previously worked up completely.  She also has hypothyroidism on levothyroxine  125 mcg p.o. daily, osteoporosis, type 2 diabetes followed in this clinic.  She remains on Tresiba  46 units nightly.  She presents with her freestyle libre CGM device showing 90% time in range, 10% level 1 hyperglycemia.  She did not document or report hypoglycemia.  Her point-of-care A1c is 6.4% today with glucose variability of 20.7%.  -  Her previsit thyroid  function tests are consistent with appropriate replacement.    She has no new complaints today.  She is also here to get her next  Jubbonti injection to treat osteoporosis.  She has tolerated Prolia  treatment before. -In April 2022,  she underwent surveillance MRI abdomen/pelvis which documented stable adrenal finding.  This study was done to follow-up pancreatic cyst which was also reported to be stable and favoring benign findings.   -Her  previous 24-hour urine function studies for adrenal function have been within normal limits . -She did not have subsequent adrenal imaging. -She denies history of difficult to control hypertension, in fact has history of orthostatic hypotension.  She denies spells of palpitations, headaches, sweating. -She denies close  family history of  pituitary, adrenal dysfunctions.  Review of Systems  Limited as above.  Objective:    BP 122/68   Pulse (!) 56   Ht 4' 11 (1.499 m)   Wt 234 lb 3.2 oz (106.2 kg)   BMI 47.30 kg/m   Wt Readings from Last 3 Encounters:  01/03/24 232 lb (105.2 kg)  01/03/24 234 lb 3.2 oz (106.2 kg)  10/25/23 228 lb (103.4 kg)    Recent Results (from the past 2160 hours)  Surgical pathology     Status: None   Collection Time: 11/29/23 12:00 AM  Result Value Ref Range   SURGICAL PATHOLOGY      SURGICAL PATHOLOGY [facility] [address] [place], [place] [address] Telephone [phone] or [phone] Fax [phone]  REPORT OF SURGICAL PATHOLOGY   Accession #: [id number] Patient Name: ALLAYA, ABBASI Visit # :   MRN: [id number] Physician: Henry Slough DOB/Age 11/11/1945 (Age: 41) Gender: F Collected Date: 11/29/2023 Received Date: 11/30/2023  FINAL DIAGNOSIS       1. Endometrium, biopsy,  :       - SPARSE FRAGMENTS OF INACTIVE ENDOMETRIUM.      - INCIDENTAL FRAGMENTS OF BENIGN ENDOCERVICAL MUCOSA.      - MUCUS AND INFLAMMATORY CELLS.       DATE SIGNED  OUT: 12/01/2023 ELECTRONIC SIGNATURE : [place] Md, Mark, Pathologist, Electronic Signature  MICROSCOPIC DESCRIPTION  CASE COMMENTS STAINS USED IN DIAGNOSIS: H&E    CLINICAL HISTORY  SPECIMEN(S) OBTAINED 1. Endometrium, biopsy,  SPECIMEN COMMENTS: SPECIMEN CLINICAL INFORMATION: 1. Postmenopausal bleeding    Gross Description 1. Received in  formalin is a 1.5 x 1.4 x 0.3 cm aggregate of cloudy mucinous material.  The specimen is entirely submitted in one block.   (KW:kh 11/30/23)        Report signed out from the following location(s) [place] [address] [place], [place] [address] CLIA #: [id number]  [facility] [address] [place], [place] [address] CLIA #: [id number]   Urinalysis, Routine w reflex microscopic      Status: Abnormal   Collection Time: 12/07/23  3:10 PM  Result Value Ref Range   Specific Gravity, UA 1.020 1.005 - 1.030   pH, UA 6.0 5.0 - 7.5   Color, UA Yellow Yellow   Appearance Ur Clear Clear   Leukocytes,UA 2+ (A) Negative   Protein,UA Negative Negative/Trace   Glucose, UA Negative Negative   Ketones, UA Negative Negative   RBC, UA Negative Negative   Bilirubin, UA Negative Negative   Urobilinogen, Ur 0.2 0.2 - 1.0 mg/dL   Nitrite, UA Negative Negative   Microscopic Examination See below:     Comment: Microscopic was indicated and was performed.  Microscopic Examination     Status: Abnormal   Collection Time: 12/07/23  3:10 PM   Urine  Result Value Ref Range   WBC, UA >30 (A) 0 - 5 /hpf   RBC, Urine 3-10 (A) 0 - 2 /hpf   Epithelial Cells (non renal) 0-2 0 - 10 /hpf   Bacteria, UA Moderate (A) None seen/Few  Urine Culture     Status: Abnormal   Collection Time: 12/07/23  3:33 PM   Specimen: Urine   UR  Result Value Ref Range   Urine Culture, Routine Final report (A)    Organism ID, Bacteria Escherichia coli (A)     Comment: Cefazolin with an MIC <=16 predicts susceptibility to the oral agents cefaclor, cefdinir, cefpodoxime, cefprozil , cefuroxime, cephalexin , and loracarbef when used for therapy of uncomplicated urinary tract infections due to E. coli, Klebsiella pneumoniae, and Proteus mirabilis. Greater than 100,000 colony forming units per mL    Antimicrobial Susceptibility Comment     Comment:       ** S = Susceptible; I = Intermediate; R = Resistant **                    P = Positive; N = Negative             MICS are expressed in micrograms per mL    Antibiotic                 RSLT#1    RSLT#2    RSLT#3    RSLT#4 Amoxicillin /Clavulanic Acid    S Ampicillin                     S Cefazolin                      S Cefepime                       S Cefoxitin  S Cefpodoxime                    S Ceftriaxone                    S Ciprofloxacin                   S Ertapenem                      S Gentamicin                      S Levofloxacin                   S Meropenem                      S Nitrofurantoin                  S Piperacillin/Tazobactam        S Tetracycline                   S Tobramycin                     S Trimethoprim/Sulfa             S   COLOGUARD     Status: None   Collection Time: 12/14/23 12:46 PM  Result Value Ref Range   COLOGUARD Sample Could Not Be Processed 6 N/A    Comment: The Cologuard (TM) test was assigned to this specimen.  The specimen was not collected according to the provided instructions. The patient will be contacted to initiate a new sample collection.  Lipid panel     Status: None   Collection Time: 12/27/23 12:21 PM  Result Value Ref Range   Cholesterol, Total 142 100 - 199 mg/dL   Triglycerides 876 0 - 149 mg/dL   HDL 40 >60 mg/dL   VLDL Cholesterol Cal 22 5 - 40 mg/dL   LDL Chol Calc (NIH) 80 0 - 99 mg/dL   Chol/HDL Ratio 3.6 0.0 - 4.4 ratio    Comment:                                   T. Chol/HDL Ratio                                             Men  Women                               1/2 Avg.Risk  3.4    3.3                                   Avg.Risk  5.0    4.4                                2X Avg.Risk  9.6    7.1  3X Avg.Risk 23.4   11.0   TSH     Status: Abnormal   Collection Time: 12/27/23 12:21 PM  Result Value Ref Range   TSH 0.360 (L) 0.450 - 4.500 uIU/mL  T4, free     Status: None   Collection Time: 12/27/23 12:21 PM  Result Value Ref Range   Free T4 1.35 0.82 - 1.77 ng/dL    Diabetic Labs (most recent): Lab Results  Component Value Date   HGBA1C 6.2 06/28/2023   HGBA1C 7.1 (A) 12/14/2022   HGBA1C 6.7 08/31/2022   MICROALBUR 0.4 05/07/2014   MICROALBUR 1.74 08/09/2013     Lipid Panel ( most recent) Lipid Panel     Component Value Date/Time   CHOL 142 12/27/2023 1221   TRIG 123 12/27/2023 1221   HDL 40 12/27/2023  1221   CHOLHDL 3.6 12/27/2023 1221   CHOLHDL 3.3 05/07/2014 1122   VLDL 24 05/07/2014 1122   LDLCALC 80 12/27/2023 1221          MRI of abdomen on May 04, 2017:  Normal right adrenal gland. Left adrenal nodule on the order of 1.5 cm is consistent with an adenoma.   Surveillance abdominal MRI from December 25, 2019 IMPRESSION: 1. Motion degraded exam shows multiple tiny cystic lesions in the pancreas. Dominant lesion has minimally increased from 14 mm to 17 mm in the 2.5 year interval since previous MRI. No gross soft tissue component or abnormal enhancement evident on today's markedly motion degraded exam. At 17 mm, consensus guidelines recommend repeat imaging every 6 months for 2 years to ensure stability. This recommendation follows ACR consensus guidelines: Management of Incidental Pancreatic Cysts: A White Paper of the ACR Incidental Findings Committee. J Am Coll Radiol 2017;14:911-923. 2. Additional tiny 6 mm cystic foci in the body/tail region are new. Attention on follow-up recommended. 3. Abnormal fluid in the endometrial cavity of the uterus. Pelvic ultrasound for further evaluation. 4. Stable left adrenal adenoma. 5. Small hiatal hernia.   MRI of abdomen/pelvis on July 15, 2020 IMPRESSION: Motion degraded images.   16 mm unilocular pancreatic cyst, unchanged. This favors a benign pseudocyst or side branch IPMN and is of questionable clinical significance. Consider follow-up MR or CT abdomen with/without contrast in 1 year, as clinically warranted. (MR is generally favored, but CT may be preferred given motion concerns).   Suspected endometrial soft tissue/thickening, incompletely visualized/evaluated. This continues to raise concern for early endometrial neoplasm. GYN consultation is suggested.   These results will be called to the ordering clinician or representative by the Radiologist Assistant, and communication documented in the PACS or [employer].  Assessment & Plan:   1.  Type 2 diabetes-  Based on her presentation with controlled glycemic profile and point-of-care A1c was 6.2.  She will not need prandial insulin  for now.    She remains on Tresiba  46 units nightly.  She presents with her freestyle libre CGM device showing 90% time in range, 10% level 1 hyperglycemia.  She did not document or report hypoglycemia.  Her point-of-care A1c is 6.4% today with glucose variability of 20.7%.   It appears that she was taken off of Jardiance  since her last visit.  And this patient worries about cost of medications and would like to avoid prescription for GLP-1 receptor agonists.   Patient stands to benefit from lifestyle medicine, however she states that whole food , plant-based diet will not work for her.    - she acknowledges that there is a room  for improvement in her food and drink choices. - Suggestion is made for her to avoid simple carbohydrates  from her diet including Cakes, Sweet Desserts, Ice Cream, Soda (diet and regular), Sweet Tea, Candies, Chips, Cookies, Store Bought Juices, Alcohol , Artificial Sweeteners,  Coffee Creamer, and Sugar-free Products, Lemonade. This will help patient to have more stable blood glucose profile and potentially avoid unintended weight gain.    She will not need prandial insulin  for now.  She is advised to continue Tresiba  46 units nightly,  associated with continuous utility of her CGM.     She is advised to call clinic for hypoglycemia below 70 or hyperglycemia greater than 200 mg per DL.  2.  Adenoma of left adrenal gland -She did not have any subsequent dedicated adrenal imaging since 2022.   The left adrenal gland is reported to have mild thickening, right adrenal gland is normal.    -Her previsit plasma metanephrines are within normal limits.   -She has previously undergone 24-hour urine studies for catecholamines, metanephrines, cortisol, and aldosterone, which are all within  normal limits.  -This is consistent with nonfunctioning left adrenal adenoma of 1.5 cm, stable on imaging studies.   -She will not need antiadrenal  intervention at this time. However, this patient will benefit from surveillance imaging.  Due to her CKD, instead of CT she will be considered for MRI to reassess the pancreatic cyst as well as adrenal nodule.      3.  Hypothyroidism Her previsit thyroid  function tests are consistent with appropriate replacement.  She is advised to continue levothyroxine  125 mcg p.o. daily before breakfast.      - We discussed about the correct intake of her thyroid  hormone, on empty stomach at fasting, with water , separated by at least 30 minutes from breakfast and other medications,  and separated by more than 4 hours from calcium , iron, multivitamins, acid reflux medications (PPIs). -Patient is made aware of the fact that thyroid  hormone replacement is needed for life, dose to be adjusted by periodic monitoring of thyroid  function tests.   4)  Osteoporosis: she did not tolerate Prolia  injections in the previous sessions, insurance recently switched to Jubbonti.  She will receive her first Jubbonti  injection today and every 6 months.      She is well-informed that this treatment should not be interrupted  without appropriate backup/replacement to avoid withdrawal fracture.     - I advised patient to maintain close follow up with Alphonsa Glendia LABOR, MD for primary care needs.   I spent  41  minutes in the care of the patient today including review of labs from CMP, Lipids, Thyroid  Function, Hematology (current and previous including abstractions from other facilities); face-to-face time discussing  her blood glucose readings/logs, discussing hypoglycemia and hyperglycemia episodes and symptoms, medications doses, her options of short and long term treatment based on the latest standards of care / guidelines;  discussion about incorporating lifestyle medicine;  and  documenting the encounter. Risk reduction counseling performed per USPSTF guidelines to reduce  obesity and cardiovascular risk factors.     Please refer to Patient Instructions for Blood Glucose Monitoring and Insulin /Medications Dosing Guide  in media tab for additional information. Please  also refer to  Patient Self Inventory in the Media  tab for reviewed elements of pertinent patient history.  Kristen Mack participated in the discussions, expressed understanding, and voiced agreement with the above plans.  All questions were answered to her satisfaction. she is  encouraged to contact clinic should she have any questions or concerns prior to her return visit.    Follow up plan: Return in about 3 months (around 04/04/2024), or MRI abdomen/pelvis, for Bring Meter/CGM Device/Logs- A1c in Office, Prolia (Jubbonti) Today (ASAP) and P(J) NV.   Ranny Earl, MD [facility] [address] [place], [place] [address] Phone: [phone]  Fax: [phone]     01/03/2024, 4:06 PM  This note was partially dictated with voice recognition software. Similar sounding words can be transcribed inadequately or may not  be corrected upon review.

## 2024-01-03 NOTE — Progress Notes (Signed)
 Pt seen today for a follow up with Dr. Lenis and to receive Jubbonti injection. Pt supplied Jubbonti ( Lot [id number], Exp. Date 06/26/2025). Jubbonti 60mg  given SQ in upper L arm without difficulty. Pt waited the required 15 minutes post injection, no adverse reactions noted.

## 2024-01-03 NOTE — Progress Notes (Signed)
   Subjective:    Patient ID: Kristen Mack, female    DOB: [birth year], 78 y.o.   MRN: [id number]  HPI 6 month follow up - HTN  Right knee pain , left thigh pain with standing  Patient does have hypertension takes her medicine regular basis Patient does use Lasix  but sometimes she skips it when she does not want to pee as much She does take the gabapentin  to help with neuropathic pain which it does She takes her thyroid  medicine regular basis denies missing it Takes her allergy medicines her breathing is done well recently Takes her cholesterol medicine regular basis has not had any setbacks Does take her bladder medicine per Dr. Sherrilee     01/03/2024    2:56 PM 08/17/2023    3:14 PM 04/26/2023    3:35 PM  PHQ9 SCORE ONLY  PHQ-9 Total Score 6 6  6       Data saved with a previous flowsheet row definition    Review of Systems     Objective:   Physical Exam General-in no acute distress Eyes-no discharge Lungs-respiratory rate normal, CTA CV-no murmurs,RRR Extremities skin warm dry no edema Neuro grossly normal Behavior normal, alert        Assessment & Plan:  1. Immunization due (Primary) Flu shot today so - Flu vaccine HIGH DOSE PF(Fluzone Trivalent)  2. Chronic pain of right knee More than likely osteoarthritis Tylenol  would be the best approach  3. Type 2 DM with CKD stage 3 and hypertension (HCC) Diabetes fairly good control continue current measures kidney function stable  4. Left thigh pain She describes a pain from the hip down toward the thigh if it persist we will need to do x-rays  5. Depression, major, single episode, in partial remission Continue medicine PHQ looking better  Follow-up in 6 months

## 2024-01-03 NOTE — Patient Instructions (Signed)

## 2024-01-09 LAB — COLOGUARD: COLOGUARD: POSITIVE — AB

## 2024-01-11 ENCOUNTER — Other Ambulatory Visit: Payer: Self-pay | Admitting: "Endocrinology

## 2024-01-11 DIAGNOSIS — E1122 Type 2 diabetes mellitus with diabetic chronic kidney disease: Secondary | ICD-10-CM

## 2024-01-12 DIAGNOSIS — E1122 Type 2 diabetes mellitus with diabetic chronic kidney disease: Secondary | ICD-10-CM | POA: Diagnosis not present

## 2024-01-16 ENCOUNTER — Telehealth: Payer: Self-pay | Admitting: *Deleted

## 2024-01-16 DIAGNOSIS — G8929 Other chronic pain: Secondary | ICD-10-CM

## 2024-01-16 DIAGNOSIS — H8109 Meniere's disease, unspecified ear: Secondary | ICD-10-CM

## 2024-01-16 DIAGNOSIS — R296 Repeated falls: Secondary | ICD-10-CM

## 2024-01-16 DIAGNOSIS — N183 Chronic kidney disease, stage 3 unspecified: Secondary | ICD-10-CM

## 2024-01-16 NOTE — Telephone Encounter (Signed)
 Mauro Elveria BROCKS, NP     01/16/24  2:10 PM Will she need a specific visit or detailed notes from her home health verifying need? Should we place the order since they will send paper work? Thanks.

## 2024-01-16 NOTE — Telephone Encounter (Signed)
 Copied from CRM #[id number]. Topic: Clinical - Home Health Verbal Orders >> Jan 13, 2024  2:12 PM Hadassah PARAS wrote: Caller/Agency: Lenward from [facility] Callback Number: [phone] Service Requested: 4 wheeled walker with seat, please send to [facility] Frequency: n/a Any new concerns about the patient? No

## 2024-01-20 ENCOUNTER — Telehealth: Payer: Self-pay | Admitting: Family Medicine

## 2024-01-20 ENCOUNTER — Ambulatory Visit: Payer: PPO

## 2024-01-20 VITALS — Ht 59.0 in | Wt 232.0 lb

## 2024-01-20 DIAGNOSIS — Z0000 Encounter for general adult medical examination without abnormal findings: Secondary | ICD-10-CM

## 2024-01-20 DIAGNOSIS — Z1231 Encounter for screening mammogram for malignant neoplasm of breast: Secondary | ICD-10-CM

## 2024-01-20 NOTE — Progress Notes (Signed)
 Subjective:   Kristen Mack is a 78 y.o. who presents for a Medicare Wellness preventive visit.  As a reminder, Annual Wellness Visits don't include a physical exam, and some assessments may be limited, especially if this visit is performed virtually. We may recommend an in-person follow-up visit with your provider if needed.  Visit Complete: Virtual I connected with  Kristen Mack on 01/20/24 by a audio enabled telemedicine application and verified that I am speaking with the correct person using two identifiers.  Patient Location: Home  Provider Location: Home Office  I discussed the limitations of evaluation and management by telemedicine. The patient expressed understanding and agreed to proceed.  Vital Signs: Because this visit was a virtual/telehealth visit, some criteria may be missing or patient reported. Any vitals not documented were not able to be obtained and vitals that have been documented are patient reported.  VideoDeclined- This patient declined Librarian, academic. Therefore the visit was completed with audio only.  Persons Participating in Visit: Patient assisted by daughter Amy.  AWV Questionnaire: No: Patient Medicare AWV questionnaire was not completed prior to this visit.  Cardiac Risk Factors include: advanced age (>44men, >38 women);diabetes mellitus;dyslipidemia;hypertension;sedentary lifestyle     Objective:    Today's Vitals   01/20/24 1351  Weight: 232 lb (105.2 kg)  Height: 4' 11 (1.499 m)   Body mass index is 46.86 kg/m.     01/20/2024    1:56 PM 06/20/2023    8:59 PM 06/17/2023    3:15 PM 01/14/2023    2:19 PM 01/05/2022    1:58 PM 12/11/2021   11:00 PM 12/11/2021   11:39 AM  Advanced Directives  Does Patient Have a Medical Advance Directive? No No No No No No No  Would patient like information on creating a medical advance directive? Yes (MAU/Ambulatory/Procedural Areas - Information given) No -  Patient declined No - Patient declined No - Patient declined No - Patient declined No - Patient declined     Current Medications (verified) Outpatient Encounter Medications as of 01/20/2024  Medication Sig   acetaminophen  (TYLENOL ) 500 MG tablet Take 500 mg by mouth every 6 (six) hours as needed.   albuterol  (PROVENTIL ) (2.5 MG/3ML) 0.083% nebulizer solution Take 3 mLs (2.5 mg total) by nebulization every 6 (six) hours as needed for wheezing or shortness of breath.   albuterol  (VENTOLIN  HFA) 108 (90 Base) MCG/ACT inhaler Inhale 2 puffs into the lungs every 4 (four) hours as needed. INHALE 2 PUFFS INTO LUNGS EVERY 6 HOURS AS NEEDED FOR SHORTNESS OF BREATH   Azelastine  HCl 137 MCG/SPRAY SOLN 2 sprays each nostrils BID   B Complex Vitamins (B COMPLEX 100 PO) Take 1 tablet by mouth daily.   blood glucose meter kit and supplies Dispense based on patient and insurance preference. Use to test sugars BID  (FOR ICD-10 E10.9, E11.9).   Blood Pressure Monitoring (RELION BLOOD PRESSURE MONITOR) KIT Use daily to monitor BP   buPROPion  (WELLBUTRIN  XL) 300 MG 24 hr tablet Take 1 tablet (300 mg total) by mouth daily.   Chromium Picolinate 1000 MCG TABS Take 1 tablet by mouth daily.   Coenzyme Q10 (COQ10) 200 MG CAPS Take 200 mg by mouth daily.   Continuous Glucose Receiver (FREESTYLE LIBRE 3 READER) DEVI 1 Piece by Does not apply route once as needed for up to 1 dose.   Continuous Glucose Sensor (FREESTYLE LIBRE 3 PLUS SENSOR) MISC Change sensor every 15 days.   Cyanocobalamin (B-12) 1000  MCG SUBL Place 1,000 mcg under the tongue daily.   denosumab -bbdz (JUBBONTI) 60 MG/ML SOSY Inject 60 mg into the skin every 6 (six) months.   DIGESTIVE ENZYMES PO Take 1 tablet by mouth 3 (three) times daily.    FREESTYLE PRECISION NEO TEST test strip USE 1 STRIP TO CHECK GLUCOSE 4 TIMES DAILY AS DIRECTED   furosemide  (LASIX ) 20 MG tablet TAKE 1 TABLET BY MOUTH ONCE DAILY IN THE MORNING AS NEEDED PEDAL EDEMA   gabapentin   (NEURONTIN ) 100 MG capsule Take 1 capsule (100 mg total) by mouth 2 (two) times daily.   levothyroxine  (SYNTHROID ) 125 MCG tablet Take 1 tablet (125 mcg total) by mouth daily before breakfast.   Lysine HCl 500 MG TABS Take 500 mg by mouth daily.   Magnesium  250 MG TABS Take 250 mg by mouth 2 (two) times daily.   meclizine (ANTIVERT) 25 MG tablet Take 25 mg by mouth 3 (three) times daily as needed for dizziness.   mirabegron  ER (MYRBETRIQ ) 25 MG TB24 tablet Take 1 tablet (25 mg total) by mouth daily.   montelukast  (SINGULAIR ) 10 MG tablet Take 1 tablet (10 mg total) by mouth at bedtime.   nitrofurantoin  (MACRODANTIN ) 50 MG capsule Take 1 capsule (50 mg total) by mouth at bedtime.   nitrofurantoin , macrocrystal-monohydrate, (MACROBID ) 100 MG capsule Take 1 capsule (100 mg total) by mouth every 12 (twelve) hours.   OVER THE COUNTER MEDICATION Take 1 Scoop by mouth 3 (three) times daily with meals. Fennel seeds   OVER THE COUNTER MEDICATION Choline and inisotol   oxyCODONE  (ROXICODONE ) 5 MG immediate release tablet Take 1 tablet (5 mg total) by mouth every 4 (four) hours as needed for breakthrough pain.   oxyCODONE -acetaminophen  (PERCOCET/ROXICET) 5-325 MG tablet Take 1 tablet by mouth every 4 (four) hours as needed for severe pain (pain score 7-10).   RELION PEN NEEDLES 31G X 6 MM MISC USE 1 PEN NEEDLE ONCE DAILY TO INJECT INSULIN    rosuvastatin  (CRESTOR ) 5 MG tablet TAKE 1 TABLET BY MOUTH ON MONDAY, WEDNESDAY,  FRIDAY AND SATURDAY   spironolactone  (ALDACTONE ) 25 MG tablet Take 1 tablet by mouth once daily   TART CHERRY PO Take 2 tablets by mouth 3 (three) times daily.   TRESIBA  FLEXTOUCH 100 UNIT/ML FlexTouch Pen INJECT 46 UNITS SUBCUTANEOUSLY AT BEDTIME   valsartan  (DIOVAN ) 160 MG tablet Take 1 tablet (160 mg total) by mouth daily.   Vitamin A 3 MG (10000 UT) TABS Take 10,000 Units by mouth daily.   vitamin E 180 MG (400 UNITS) capsule Take 800 Units by mouth daily.    Facility-Administered  Encounter Medications as of 01/20/2024  Medication   gentamicin  (GARAMYCIN ) 330 mg in dextrose  5 % 100 mL IVPB    Allergies (verified) Latex, Ambien [zolpidem tartrate], Cefzil  [cefprozil ], Citalopram , Codeine, Demerol  [meperidine ], Formaldehyde, Sulfa antibiotics, Zolpidem, and Celexa  [citalopram  hydrobromide]   History: Past Medical History:  Diagnosis Date   ADHD (attention deficit hyperactivity disorder) 2007   Adrenal adenoma, left    followed by dr nida   Arthritis    Asthma    followed by pcp   CFS (chronic fatigue syndrome)    CKD (chronic kidney disease), stage III [facility])    nephrologist---  dr marlee   Depression    Edema of both lower extremities    Fatty liver disease, nonalcoholic    Fibromyalgia    GERD (gastroesophageal reflux disease)    Hiatal hernia    History of adenomatous polyp of colon  History of cardiac murmur    until age 73- pt has had echocardiograms   History of TIA (transient ischemic attack)    per CT scan    Hyperlipidemia    Hypertension    Hypothyroidism    endocrinologist-- dr g. nida   IBS (irritable bowel syndrome)    Meniere disease    OSA on CPAP    per pt uses nightly   Osteopenia    Pancreas cyst    followed by pcp   PMB (postmenopausal bleeding)    PVC's (premature ventricular contractions)    cardiologist--- dr barbaraann   Sleep apnea    Type 2 diabetes mellitus treated with insulin  [facility])    endocrinologist-- dr g. nida;    (08-14-2021  per pt checks blood sugar QID ,  fasting average --- 73-150)   Urinary incontinence    Wears glasses    Wears partial dentures    upper   Past Surgical History:  Procedure Laterality Date   CERVICAL POLYPECTOMY N/A 08/17/2021   Procedure: BIOPSY OF ENDOMETRIUM AND OR POLYPECTOMY;  Surgeon: Bettina Muskrat, MD;  Location: [facility];  Service: Gynecology;  Laterality: N/A;   CESAREAN SECTION  1977  &  1979   CHOLECYSTECTOMY  1995   and  REPAIR UMBILICAL HERNIA   COLONOSCOPY  N/A 04/19/2013   Procedure: COLONOSCOPY;  Surgeon: Claudis RAYMOND Rivet, MD;  [facility];  Service: Endoscopy;  Laterality: N/A;     DILITATION & CURRETTAGE/HYSTROSCOPY WITH NOVASURE ABLATION N/A 08/17/2021   Procedure: DILATATION & CURETTAGE/HYSTEROSCOPY;  Surgeon: Bettina Muskrat, MD;  Location: [facility];  Service: Gynecology;  Laterality: N/A;   ENDOMETRIAL BIOPSY  11/29/2023   ESOPHAGOGASTRODUODENOSCOPY  02/08/2003   HERNIA REPAIR  1995   HYSTEROSCOPY WITH D & C  11/14/2002   endometrial polypectomy   HYSTEROSCOPY WITH D & C N/A 04/05/2014   Procedure: DILATATION AND CURETTAGE /HYSTEROSCOPY, ENDOMETRIAL POLYPECTOMY;  Surgeon: Muskrat Bettina, MD;  Location: [facility];  Service: Gynecology;  Laterality: N/A;   LIVER BIOPSY  01/29/2004   benign   TUBAL LIGATION  1979   Family History  Problem Relation Age of Onset   Rectal cancer Brother 15   Diabetes Brother    Ovarian cancer Mother 37   Hypertension Mother    Diabetes Mother    Cancer Mother    Depression Mother    Early death Mother    Obesity Mother    Hypertension Father    Diabetes Father    Depression Father    Hyperlipidemia Father    Stroke Father    Breast cancer Maternal Aunt    Breast cancer Paternal Aunt        four Paternal aunts   Heart disease Paternal Grandfather    Hyperlipidemia Paternal Grandfather    Hypertension Paternal Grandfather    Stroke Paternal Grandfather    Liver disease Sister        x 2; fatty liver   Hearing loss Maternal Grandfather    Hypertension Maternal Grandfather    Heart disease Maternal Grandmother    Hypertension Maternal Grandmother    Obesity Maternal Grandmother    Obesity Paternal Grandmother    ADD / ADHD Daughter    Depression Daughter    ADD / ADHD Daughter    Depression Daughter    Alcohol abuse Maternal Uncle    Depression Maternal Uncle    Drug abuse Maternal Uncle    Early death Maternal  Uncle    Alcohol abuse Paternal  Uncle    Depression Paternal Uncle    Drug abuse Paternal Uncle    Anxiety disorder Sister    Depression Sister    Hyperlipidemia Sister    Miscarriages / Stillbirths Sister    Cancer Brother    Depression Brother    Early death Brother    Cancer Maternal Aunt    Cancer Maternal Aunt    Cancer Paternal Aunt    Cancer Paternal Aunt    Cancer Paternal Aunt    Cancer Paternal Aunt    Drug abuse Brother    Heart disease Maternal Uncle    Hyperlipidemia Maternal Uncle    Hypertension Maternal Uncle    Hyperlipidemia Sister    Hyperlipidemia Brother    Hyperlipidemia Brother    Hypertension Maternal Uncle    Pancreatic cancer Neg Hx    Esophageal cancer Neg Hx    Social History   Socioeconomic History   Marital status: Married    Spouse name: Lamar   Number of children: 2   Years of education: Not on file   Highest education level: Associate degree: occupational, Scientist, product/process development, or vocational program  Occupational History   Occupation: retired, Charity fundraiser  Tobacco Use   Smoking status: Former    Current packs/day: 0.00    Average packs/day: 1.5 packs/day for 3.0 years (4.5 ttl pk-yrs)    Types: Cigarettes    Start date: 10/03/1965    Quit date: 10/03/1968    Years since quitting: 55.3    Passive exposure: Past   Smokeless tobacco: Never  Vaping Use   Vaping status: Never Used  Substance and Sexual Activity   Alcohol use: No   Drug use: No   Sexual activity: Not Currently    Birth control/protection: None  Other Topics Concern   Not on file  Social History Narrative   2 daughters, Amy and Rosaline. Amy lives with parents. Rosaline lives in [place].   Social Drivers of Corporate investment banker Strain: Low Risk  (01/20/2024)   Overall Financial Resource Strain (CARDIA)    Difficulty of Paying Living Expenses: Not very hard  Food Insecurity: Food Insecurity Present (01/20/2024)   Hunger Vital Sign    Worried About Running Out of Food in the Last Year: Sometimes true     Ran Out of Food in the Last Year: Never true  Transportation Needs: No Transportation Needs (01/20/2024)   PRAPARE - Administrator, Civil Service (Medical): No    Lack of Transportation (Non-Medical): No  Physical Activity: Inactive (01/20/2024)   Exercise Vital Sign    Days of Exercise per Week: 0 days    Minutes of Exercise per Session: 0 min  Stress: No Stress Concern Present (01/20/2024)   [employer] of Occupational Health - Occupational Stress Questionnaire    Feeling of Stress: Not at all  Social Connections: Moderately Isolated (01/20/2024)   Social Connection and Isolation Panel    Frequency of Communication with Friends and Family: More than three times a week    Frequency of Social Gatherings with Friends and Family: Once a week    Attends Religious Services: Never    Database administrator or Organizations: No    Attends Engineer, structural: Never    Marital Status: Married    Tobacco Counseling Counseling given: Not Answered    Clinical Intake:  Pre-visit preparation completed: Yes  Pain : No/denies pain  Diabetes: Yes CBG done?:  No Did pt. bring in CBG monitor from home?: No  Lab Results  Component Value Date   HGBA1C 6.2 06/28/2023   HGBA1C 7.1 (A) 12/14/2022   HGBA1C 6.7 08/31/2022     How often do you need to have someone help you when you read instructions, pamphlets, or other written materials from your doctor or pharmacy?: 1 - Never  Interpreter Needed?: No  Information entered by :: Charmaine Bloodgood LPN   Activities of Daily Living     01/20/2024    1:55 PM  In your present state of health, do you have any difficulty performing the following activities:  Hearing? 1  Vision? 0  Difficulty concentrating or making decisions? 0  Walking or climbing stairs? 1  Dressing or bathing? 0  Doing errands, shopping? 1  Preparing Food and eating ? N  Using the Toilet? N  In the past six months, have you accidently leaked  urine? N  Do you have problems with loss of bowel control? N  Managing your Medications? Y  Managing your Finances? Y  Housekeeping or managing your Housekeeping? Y    Patient Care Team: Alphonsa Glendia LABOR, MD as PCP - General (Family Medicine) O'Neal, Darryle Ned, MD as PCP - Cardiology (Cardiology) Maree Paticia BRAVO, MD as Referring Physician (Ophthalmology) Lenis Ethelle ORN, MD as Consulting Physician (Endocrinology) Sherrilee Belvie CROME, MD as Consulting Physician (Urology) Prescilla Beams, MD as Consulting Physician (Nephrology) Margrette Taft BRAVO, MD as Consulting Physician (Orthopedic Surgery) Henry Slough, MD as Consulting Physician (Obstetrics and Gynecology) Gregg Lek, MD as Consulting Physician (Neurology)  I have updated your Care Teams any recent Medical Services you may have received from other providers in the past year.     Assessment:   This is a routine wellness examination for [place].  Hearing/Vision screen Hearing Screening - Comments:: Some hearing loss; wears hearing aids  Vision Screening - Comments:: Wears rx glasses - up to date with [facility] exams with [facility]    Goals Addressed             This Visit's Progress    DIET - EAT MORE FRUITS AND VEGETABLES   On track      Depression Screen     01/20/2024    1:54 PM 01/03/2024    2:56 PM 08/17/2023    3:14 PM 04/26/2023    3:35 PM 03/17/2023   11:47 AM 01/26/2023    2:27 PM 01/14/2023    2:26 PM  PHQ 2/9 Scores  PHQ - 2 Score 1 1 1 2  0 1 1  PHQ- 9 Score 6 6 6 6 6 8 8     Fall Risk     01/20/2024    1:55 PM 10/25/2023    2:10 PM 04/26/2023    3:35 PM 03/17/2023   11:47 AM 01/26/2023    2:27 PM  Fall Risk   Falls in the past year? 1 1 1 1 1   Number falls in past yr: 1 0 0 0 0  Injury with Fall? 1 0 0 0 0  Risk for fall due to : History of fall(s);Impaired balance/gait;Impaired mobility Impaired balance/gait;Orthopedic patient;Impaired mobility     Follow  up Education provided;Falls prevention discussed;Falls evaluation completed Falls evaluation completed       MEDICARE RISK AT HOME:  Medicare Risk at Home Any stairs in or around the home?: No If so, are there any without handrails?: No Home free of loose throw rugs in walkways, pet beds,  electrical cords, etc?: Yes Adequate lighting in your home to reduce risk of falls?: Yes Life alert?: No Use of a cane, walker or w/c?: Yes Grab bars in the bathroom?: Yes Shower chair or bench in shower?: Yes Elevated toilet seat or a handicapped toilet?: Yes  TIMED UP AND GO:  Was the test performed?  No  Cognitive Function: 6CIT completed    10/25/2023    2:28 PM  MMSE - Mini Mental State Exam  Orientation to time 5  Orientation to Place 5  Registration 3  Attention/ Calculation 5  Recall 3  Language- name 2 objects 2  Language- repeat 1  Language- follow 3 step command 3  Language- read & follow direction 1  Write a sentence 1  Copy design 1  Total score 30        01/20/2024    1:56 PM 01/14/2023    2:26 PM 01/05/2022    2:09 PM 12/30/2020    2:03 PM  6CIT Screen  What Year? 0 points 0 points 0 points 0 points  What month? 0 points 0 points 0 points 0 points  What time? 0 points 0 points 0 points 0 points  Count back from 20 0 points 0 points 0 points 0 points  Months in reverse 0 points 0 points 0 points 0 points  Repeat phrase 0 points 0 points 4 points 0 points  Total Score 0 points 0 points 4 points 0 points    Immunizations Immunization History  Administered Date(s) Administered   Fluad Quad(high Dose 65+) 01/22/2020, 02/10/2021, 01/22/2022   Fluad Trivalent(High Dose 65+) 01/26/2023   INFLUENZA, HIGH DOSE SEASONAL PF 04/15/2018, 01/13/2019, 01/03/2024   Influenza,inj,Quad PF,6+ Mos 03/02/2016, 03/03/2017   Influenza-Unspecified 04/15/2018, 01/15/2019, 01/23/2020   Moderna Sars-Covid-2 Vaccination 05/23/2019, 06/19/2019, 03/05/2020   Pneumococcal Conjugate-13  05/22/2014   Pneumococcal Polysaccharide-23 03/03/2017   Tdap 04/18/2020   Zoster Recombinant(Shingrix ) 12/08/2018, 04/27/2019    Screening Tests Health Maintenance  Topic Date Due   Mammogram  10/21/2023   COVID-19 Vaccine (4 - 2025-26 season) 11/28/2023   HEMOGLOBIN A1C  12/28/2023   OPHTHALMOLOGY EXAM  01/31/2024   Diabetic kidney evaluation - Urine ACR  07/20/2024   Diabetic kidney evaluation - eGFR measurement  08/11/2024   FOOT EXAM  08/16/2024   Medicare Annual Wellness (AWV)  01/19/2025   DTaP/Tdap/Td (2 - Td or Tdap) 04/18/2030   Pneumococcal Vaccine: 50+ Years  Completed   Influenza Vaccine  Completed   DEXA SCAN  Completed   Hepatitis C Screening  Completed   Zoster Vaccines- Shingrix   Completed   Meningococcal B Vaccine  Aged Out   Colonoscopy  Discontinued    Health Maintenance Items Addressed: Mammogram ordered  Additional Screening:  Vision Screening: Recommended annual ophthalmology exams for early detection of glaucoma and other disorders of the eye. Is the patient up to date with their annual eye exam?  Yes  Who is the provider or what is the name of the office in which the patient attends annual eye exams? [facility]   Dental Screening: Recommended annual dental exams for proper oral hygiene  Community Resource Referral / Chronic Care Management: CRR required this visit?  No   CCM required this visit?  No   Plan:    I have personally reviewed and noted the following in the patient's chart:   Medical and social history Use of alcohol, tobacco or illicit drugs  Current medications and supplements including opioid prescriptions. Patient is not currently taking  opioid prescriptions. Functional ability and status Nutritional status Physical activity Advanced directives List of other physicians Hospitalizations, surgeries, and ER visits in previous 12 months Vitals Screenings to include cognitive, depression, and falls Referrals and  appointments  In addition, I have reviewed and discussed with patient certain preventive protocols, quality metrics, and best practice recommendations. A written personalized care plan for preventive services as well as general preventive health recommendations were provided to patient.   Lavelle Pfeiffer [place], [place]   [id number]   After Visit Summary: (MyChart) Due to this being a telephonic visit, the after visit summary with patients personalized plan was offered to patient via MyChart   Notes: See telephone note

## 2024-01-20 NOTE — Telephone Encounter (Signed)
 Copied from CRM [id number]. Topic: General - Other >> Jan 20, 2024  1:18 PM Zebedee SAUNDERS wrote: Reason for CRM: Pt can't make AWV today and does not allow me to reschedule on Epic please call Angelea Penny [phone]

## 2024-01-20 NOTE — Patient Instructions (Signed)
 Ms. Hagin,  Thank you for taking the time for your Medicare Wellness Visit. I appreciate your continued commitment to your health goals. Please review the care plan we discussed, and feel free to reach out if I can assist you further.  Medicare recommends these wellness visits once per year to help you and your care team stay ahead of potential health issues. These visits are designed to focus on prevention, allowing your provider to concentrate on managing your acute and chronic conditions during your regular appointments.  Please note that Annual Wellness Visits do not include a physical exam. Some assessments may be limited, especially if the visit was conducted virtually. If needed, we may recommend a separate in-person follow-up with your provider.  Ongoing Care Seeing your primary care provider every 3 to 6 months helps us  monitor your health and provide consistent, personalized care.   Referrals If a referral was made during today's visit and you haven't received any updates within two weeks, please contact the referred provider directly to check on the status.  You have an order for:  []   2D Mammogram  [x]   3D Mammogram  []   Bone Density     Please call for appointment:   [facility] [address] [place], [place] [address] [phone]   Schedule your [place] screening mammogram through MyChart!   Log into your MyChart account.  Go to 'Visit' (or 'Appointments' if on mobile App) --> Schedule an Appointment  Under 'Select a Reason for Visit' choose the Mammogram Screening option.  Complete the pre-visit questions and select the time and place that best fits your schedule.  Make sure to wear two-piece clothing.  No lotions, powders, or deodorants the day of the appointment. Make sure to bring picture ID and insurance card.  Bring list of medications you are currently taking including any supplements.   Recommended  Screenings:  Health Maintenance  Topic Date Due   Breast Cancer Screening  10/21/2023   COVID-19 Vaccine (4 - 2025-26 season) 11/28/2023   Hemoglobin A1C  12/28/2023   Eye exam for diabetics  01/31/2024   Yearly kidney health urinalysis for diabetes  07/20/2024   Yearly kidney function blood test for diabetes  08/11/2024   Complete foot exam   08/16/2024   Medicare Annual Wellness Visit  01/19/2025   DTaP/Tdap/Td vaccine (2 - Td or Tdap) 04/18/2030   Pneumococcal Vaccine for age over 50  Completed   Flu Shot  Completed   DEXA scan (bone density measurement)  Completed   Hepatitis C Screening  Completed   Zoster (Shingles) Vaccine  Completed   Meningitis B Vaccine  Aged Out   Colon Cancer Screening  Discontinued       01/20/2024    1:56 PM  Advanced Directives  Does Patient Have a Medical Advance Directive? No  Would patient like information on creating a medical advance directive? Yes (MAU/Ambulatory/Procedural Areas - Information given)   Advance Care Planning is important because it: Ensures you receive medical care that aligns with your values, goals, and preferences. Provides guidance to your family and loved ones, reducing the emotional burden of decision-making during critical moments.  Information on Advanced Care Planning can be found at [place]  Secretary of [facility] Advance Health Care Directives Advance Health Care Directives ([email or web address]   Vision: Annual vision screenings are recommended for early detection of glaucoma, cataracts, and diabetic retinopathy. These exams can also reveal signs of chronic conditions such as diabetes  and high blood pressure.  Dental: Annual dental screenings help detect early signs of oral cancer, gum disease, and other conditions linked to overall health, including heart disease and diabetes.  Please see the attached documents for additional preventive care recommendations.

## 2024-01-23 ENCOUNTER — Other Ambulatory Visit: Payer: Self-pay | Admitting: Nurse Practitioner

## 2024-01-24 ENCOUNTER — Encounter: Payer: Self-pay | Admitting: "Endocrinology

## 2024-01-25 NOTE — Telephone Encounter (Signed)
 Order sent.

## 2024-02-01 ENCOUNTER — Ambulatory Visit (HOSPITAL_COMMUNITY)
Admission: RE | Admit: 2024-02-01 | Discharge: 2024-02-01 | Disposition: A | Discharge status: Home / Self Care | Source: Ambulatory Visit | Attending: Family Medicine | Admitting: Family Medicine

## 2024-02-01 ENCOUNTER — Encounter (HOSPITAL_COMMUNITY): Payer: Self-pay

## 2024-02-01 DIAGNOSIS — M25561 Pain in right knee: Secondary | ICD-10-CM | POA: Diagnosis not present

## 2024-02-01 DIAGNOSIS — Z1231 Encounter for screening mammogram for malignant neoplasm of breast: Secondary | ICD-10-CM | POA: Diagnosis not present

## 2024-02-01 DIAGNOSIS — H8109 Meniere's disease, unspecified ear: Secondary | ICD-10-CM | POA: Diagnosis not present

## 2024-02-01 DIAGNOSIS — R296 Repeated falls: Secondary | ICD-10-CM | POA: Diagnosis not present

## 2024-02-01 DIAGNOSIS — E1122 Type 2 diabetes mellitus with diabetic chronic kidney disease: Secondary | ICD-10-CM | POA: Diagnosis not present

## 2024-02-02 ENCOUNTER — Other Ambulatory Visit: Payer: Self-pay | Admitting: "Endocrinology

## 2024-02-02 DIAGNOSIS — E119 Type 2 diabetes mellitus without complications: Secondary | ICD-10-CM | POA: Diagnosis not present

## 2024-02-02 DIAGNOSIS — D3502 Benign neoplasm of left adrenal gland: Secondary | ICD-10-CM

## 2024-02-02 LAB — OPHTHALMOLOGY REPORT-SCANNED

## 2024-02-07 DIAGNOSIS — G4733 Obstructive sleep apnea (adult) (pediatric): Secondary | ICD-10-CM | POA: Diagnosis not present

## 2024-02-07 DIAGNOSIS — R350 Frequency of micturition: Secondary | ICD-10-CM | POA: Diagnosis not present

## 2024-02-07 DIAGNOSIS — K219 Gastro-esophageal reflux disease without esophagitis: Secondary | ICD-10-CM | POA: Diagnosis not present

## 2024-02-07 DIAGNOSIS — K7581 Nonalcoholic steatohepatitis (NASH): Secondary | ICD-10-CM | POA: Diagnosis not present

## 2024-02-07 DIAGNOSIS — N2581 Secondary hyperparathyroidism of renal origin: Secondary | ICD-10-CM | POA: Diagnosis not present

## 2024-02-07 DIAGNOSIS — I129 Hypertensive chronic kidney disease with stage 1 through stage 4 chronic kidney disease, or unspecified chronic kidney disease: Secondary | ICD-10-CM | POA: Diagnosis not present

## 2024-02-07 DIAGNOSIS — E1122 Type 2 diabetes mellitus with diabetic chronic kidney disease: Secondary | ICD-10-CM | POA: Diagnosis not present

## 2024-02-07 DIAGNOSIS — N1832 Chronic kidney disease, stage 3b: Secondary | ICD-10-CM | POA: Diagnosis not present

## 2024-02-10 ENCOUNTER — Ambulatory Visit (HOSPITAL_COMMUNITY)
Admission: RE | Admit: 2024-02-10 | Discharge: 2024-02-10 | Disposition: A | Discharge status: Home / Self Care | Source: Ambulatory Visit | Attending: "Endocrinology | Admitting: "Endocrinology

## 2024-02-10 DIAGNOSIS — Z9049 Acquired absence of other specified parts of digestive tract: Secondary | ICD-10-CM | POA: Diagnosis not present

## 2024-02-10 DIAGNOSIS — R160 Hepatomegaly, not elsewhere classified: Secondary | ICD-10-CM | POA: Diagnosis not present

## 2024-02-10 DIAGNOSIS — D3502 Benign neoplasm of left adrenal gland: Secondary | ICD-10-CM | POA: Diagnosis not present

## 2024-02-10 DIAGNOSIS — K862 Cyst of pancreas: Secondary | ICD-10-CM | POA: Diagnosis not present

## 2024-02-10 MED ORDER — GADOBUTROL 1 MMOL/ML IV SOLN
10.0000 mL | Freq: Once | INTRAVENOUS | Status: AC | PRN
  Administered 2024-02-10: 10 mL via INTRAVENOUS

## 2024-02-12 DIAGNOSIS — E1122 Type 2 diabetes mellitus with diabetic chronic kidney disease: Secondary | ICD-10-CM | POA: Diagnosis not present

## 2024-02-13 ENCOUNTER — Telehealth: Payer: Self-pay | Admitting: Pharmacist

## 2024-02-13 NOTE — Telephone Encounter (Signed)
   This patient is appearing on a report for being at risk of failing the adherence measure for cholesterol (statin) medications this calendar year.     Insurance report was not up to date. No action needed at this time.    Loralie Malta Dattero Lora Chavers, PharmD, BCACP, CPP Clinical Pharmacist, [facility]

## 2024-02-16 ENCOUNTER — Encounter: Payer: Self-pay | Admitting: Gastroenterology

## 2024-02-24 ENCOUNTER — Other Ambulatory Visit: Payer: Self-pay | Admitting: Family Medicine

## 2024-02-28 ENCOUNTER — Other Ambulatory Visit: Payer: Self-pay | Admitting: Family Medicine

## 2024-04-04 ENCOUNTER — Encounter: Payer: Self-pay | Admitting: "Endocrinology

## 2024-04-04 ENCOUNTER — Ambulatory Visit: Admitting: "Endocrinology

## 2024-04-04 VITALS — BP 136/80 | HR 68 | Ht 59.0 in | Wt 238.8 lb

## 2024-04-04 DIAGNOSIS — M810 Age-related osteoporosis without current pathological fracture: Secondary | ICD-10-CM

## 2024-04-04 DIAGNOSIS — Z794 Long term (current) use of insulin: Secondary | ICD-10-CM | POA: Diagnosis not present

## 2024-04-04 DIAGNOSIS — K862 Cyst of pancreas: Secondary | ICD-10-CM | POA: Insufficient documentation

## 2024-04-04 DIAGNOSIS — E1165 Type 2 diabetes mellitus with hyperglycemia: Secondary | ICD-10-CM | POA: Diagnosis not present

## 2024-04-04 DIAGNOSIS — N183 Chronic kidney disease, stage 3 unspecified: Secondary | ICD-10-CM

## 2024-04-04 DIAGNOSIS — E038 Other specified hypothyroidism: Secondary | ICD-10-CM

## 2024-04-04 DIAGNOSIS — D3502 Benign neoplasm of left adrenal gland: Secondary | ICD-10-CM

## 2024-04-04 DIAGNOSIS — Z6841 Body Mass Index (BMI) 40.0 and over, adult: Secondary | ICD-10-CM

## 2024-04-04 DIAGNOSIS — E782 Mixed hyperlipidemia: Secondary | ICD-10-CM

## 2024-04-04 LAB — POCT GLYCOSYLATED HEMOGLOBIN (HGB A1C): HbA1c, POC (controlled diabetic range): 6.8 % (ref 0.0–7.0)

## 2024-04-04 MED ORDER — TRESIBA FLEXTOUCH 100 UNIT/ML ~~LOC~~ SOPN: 44.0000 [IU] | PEN_INJECTOR | Freq: Every day | SUBCUTANEOUS | 1 refills | Status: DC

## 2024-04-04 NOTE — Progress Notes (Signed)
 "                                                     04/04/2024, 5:11 PM     Endocrinology follow-up note   Subjective:    Patient ID: Kristen Mack, female    DOB: 1945-12-02, PCP Alphonsa Glendia LABOR, MD   Past Medical History:  Diagnosis Date   ADHD (attention deficit hyperactivity disorder) 2007   Adrenal adenoma, left    followed by dr Maxximus Gotay   Arthritis    Asthma    followed by pcp   CFS (chronic fatigue syndrome)    CKD (chronic kidney disease), stage III [facility])    nephrologist---  dr marlee   Depression    Edema of both lower extremities    Fatty liver disease, nonalcoholic    Fibromyalgia    GERD (gastroesophageal reflux disease)    Hiatal hernia    History of adenomatous polyp of colon    History of cardiac murmur    until age 43- pt has had echocardiograms   History of TIA (transient ischemic attack)    per CT scan    Hyperlipidemia    Hypertension    Hypothyroidism    endocrinologist-- dr g. Tyshell Ramberg   IBS (irritable bowel syndrome)    Meniere disease    OSA on CPAP    per pt uses nightly   Osteopenia    Pancreas cyst    followed by pcp   PMB (postmenopausal bleeding)    PVC's (premature ventricular contractions)    cardiologist--- dr barbaraann   Sleep apnea    Type 2 diabetes mellitus treated with insulin  [facility])    endocrinologist-- dr g. Makih Stefanko;    (08-14-2021  per pt checks blood sugar QID ,  fasting average --- 73-150)   Urinary incontinence    Wears glasses    Wears partial dentures    upper   Past Surgical History:  Procedure Laterality Date   CERVICAL POLYPECTOMY N/A 08/17/2021   Procedure: BIOPSY OF ENDOMETRIUM AND OR POLYPECTOMY;  Surgeon: Bettina Muskrat, MD;  Location: [facility];  Service: Gynecology;  Laterality: N/A;   CESAREAN SECTION  1977  &  1979   CHOLECYSTECTOMY  1995   and  REPAIR UMBILICAL HERNIA   COLONOSCOPY N/A 04/19/2013   Procedure: COLONOSCOPY;  Surgeon: Claudis RAYMOND Rivet, MD;  [facility];  Service:  Endoscopy;  Laterality: N/A;     DILITATION & CURRETTAGE/HYSTROSCOPY WITH NOVASURE ABLATION N/A 08/17/2021   Procedure: DILATATION & CURETTAGE/HYSTEROSCOPY;  Surgeon: Bettina Muskrat, MD;  Location: [facility];  Service: Gynecology;  Laterality: N/A;   ENDOMETRIAL BIOPSY  11/29/2023   ESOPHAGOGASTRODUODENOSCOPY  02/08/2003   HERNIA REPAIR  1995   HYSTEROSCOPY WITH D & C  11/14/2002   endometrial polypectomy   HYSTEROSCOPY WITH D & C N/A 04/05/2014   Procedure: DILATATION AND CURETTAGE /HYSTEROSCOPY, ENDOMETRIAL POLYPECTOMY;  Surgeon: Muskrat Bettina, MD;  Location: [facility];  Service: Gynecology;  Laterality: N/A;   LIVER BIOPSY  01/29/2004   benign   TUBAL LIGATION  1979   Social History   Socioeconomic History   Marital status: Married    Spouse name: Kristen Mack   Number of children: 2   Years of education: Not on file   Highest education level: Associate  degree: occupational, technical, or vocational program  Occupational History   Occupation: retired, [employer]  Tobacco Use   Smoking status: Former    Current packs/day: 0.00    Average packs/day: 1.5 packs/day for 3.0 years (4.5 ttl pk-yrs)    Types: Cigarettes    Start date: 10/03/1965    Quit date: 10/03/1968    Years since quitting: 55.5    Passive exposure: Past   Smokeless tobacco: Never  Vaping Use   Vaping status: Never Used  Substance and Sexual Activity   Alcohol use: No   Drug use: No   Sexual activity: Not Currently    Birth control/protection: None  Other Topics Concern   Not on file  Social History Narrative   2 daughters, Kristen Mack and Kristen Mack. Kristen Mack lives with parents. Kristen Mack lives in [place].   Social Drivers of Health   Tobacco Use: Medium Risk (04/04/2024)   Patient History    Smoking Tobacco Use: Former    Smokeless Tobacco Use: Never    Passive Exposure: Past  Physicist, Medical Strain: Low Risk (01/20/2024)   Overall Financial Resource Strain (CARDIA)    Difficulty of Paying Living  Expenses: Not very hard  Food Insecurity: Food Insecurity Present (01/20/2024)   Epic    Worried About Programme Researcher, Broadcasting/film/video in the Last Year: Sometimes true    Ran Out of Food in the Last Year: Never true  Transportation Needs: No Transportation Needs (01/20/2024)   Epic    Lack of Transportation (Medical): No    Lack of Transportation (Non-Medical): No  Physical Activity: Inactive (01/20/2024)   Exercise Vital Sign    Days of Exercise per Week: 0 days    Minutes of Exercise per Session: 0 min  Stress: No Stress Concern Present (01/20/2024)   [employer] of Occupational Health - Occupational Stress Questionnaire    Feeling of Stress: Not at all  Social Connections: Moderately Isolated (01/20/2024)   Social Connection and Isolation Panel    Frequency of Communication with Friends and Family: More than three times a week    Frequency of Social Gatherings with Friends and Family: Once a week    Attends Religious Services: Never    Database Administrator or Organizations: No    Attends Banker Meetings: Never    Marital Status: Married  Depression (PHQ2-9): Medium Risk (01/20/2024)   Depression (PHQ2-9)    PHQ-2 Score: 6  Alcohol Screen: Low Risk (01/20/2024)   Alcohol Screen    Last Alcohol Screening Score (AUDIT): 0  Housing: Low Risk (01/20/2024)   Epic    Unable to Pay for Housing in the Last Year: No    Number of Times Moved in the Last Year: 0    Homeless in the Last Year: No  Utilities: Not At Risk (01/20/2024)   Epic    Threatened with loss of utilities: No  Health Literacy: Adequate Health Literacy (01/20/2024)   B1300 Health Literacy    Frequency of need for help with medical instructions: Never   Outpatient Encounter Medications as of 04/04/2024  Medication Sig   [DISCONTINUED] TRESIBA  FLEXTOUCH 100 UNIT/ML FlexTouch Pen INJECT 46 UNITS SUBCUTANEOUSLY AT BEDTIME (Patient taking differently: 44 Units at bedtime.)   acetaminophen  (TYLENOL ) 500 MG  tablet Take 500 mg by mouth every 6 (six) hours as needed.   albuterol  (PROVENTIL ) (2.5 MG/3ML) 0.083% nebulizer solution Take 3 mLs (2.5 mg total) by nebulization every 6 (six) hours as needed for wheezing or shortness of breath.  albuterol  (VENTOLIN  HFA) 108 (90 Base) MCG/ACT inhaler INHALE 2 PUFFS BY MOUTH EVERY 4 TO 6 HOURS AS NEEDED FOR SHORTNESS OF BREATH   Azelastine  HCl 137 MCG/SPRAY SOLN 2 sprays each nostrils BID   B Complex Vitamins (B COMPLEX 100 PO) Take 1 tablet by mouth daily.   blood glucose meter kit and supplies Dispense based on patient and insurance preference. Use to test sugars BID  (FOR ICD-10 E10.9, E11.9).   Blood Pressure Monitoring (RELION BLOOD PRESSURE MONITOR) KIT Use daily to monitor BP   buPROPion  (WELLBUTRIN  XL) 300 MG 24 hr tablet Take 1 tablet (300 mg total) by mouth daily.   Chromium Picolinate 1000 MCG TABS Take 1 tablet by mouth daily.   Coenzyme Q10 (COQ10) 200 MG CAPS Take 200 mg by mouth daily.   Continuous Glucose Receiver (FREESTYLE LIBRE 3 READER) DEVI 1 Piece by Does not apply route once as needed for up to 1 dose.   Continuous Glucose Sensor (FREESTYLE LIBRE 3 PLUS SENSOR) MISC Change sensor every 15 days.   Cyanocobalamin (B-12) 1000 MCG SUBL Place 1,000 mcg under the tongue daily.   denosumab -bbdz (JUBBONTI ) 60 MG/ML SOSY Inject 60 mg into the skin every 6 (six) months.   DIGESTIVE ENZYMES PO Take 1 tablet by mouth 3 (three) times daily.    FREESTYLE PRECISION NEO TEST test strip USE 1 STRIP TO CHECK GLUCOSE 4 TIMES DAILY AS DIRECTED   furosemide  (LASIX ) 20 MG tablet TAKE 1 TABLET BY MOUTH ONCE DAILY IN THE MORNING AS NEEDED PEDAL EDEMA   gabapentin  (NEURONTIN ) 100 MG capsule Take 1 capsule (100 mg total) by mouth 2 (two) times daily.   levothyroxine  (SYNTHROID ) 125 MCG tablet Take 1 tablet (125 mcg total) by mouth daily before breakfast.   Lysine HCl 500 MG TABS Take 500 mg by mouth daily.   Magnesium  250 MG TABS Take 250 mg by mouth 2 (two) times  daily.   meclizine (ANTIVERT) 25 MG tablet Take 25 mg by mouth 3 (three) times daily as needed for dizziness.   mirabegron  ER (MYRBETRIQ ) 25 MG TB24 tablet Take 1 tablet (25 mg total) by mouth daily.   montelukast  (SINGULAIR ) 10 MG tablet Take 1 tablet (10 mg total) by mouth at bedtime.   nitrofurantoin  (MACRODANTIN ) 50 MG capsule Take 1 capsule (50 mg total) by mouth at bedtime.   nitrofurantoin , macrocrystal-monohydrate, (MACROBID ) 100 MG capsule Take 1 capsule (100 mg total) by mouth every 12 (twelve) hours.   OVER THE COUNTER MEDICATION Take 1 Scoop by mouth 3 (three) times daily with meals. Fennel seeds   OVER THE COUNTER MEDICATION Choline and inisotol   oxyCODONE  (ROXICODONE ) 5 MG immediate release tablet Take 1 tablet (5 mg total) by mouth every 4 (four) hours as needed for breakthrough pain.   oxyCODONE -acetaminophen  (PERCOCET/ROXICET) 5-325 MG tablet Take 1 tablet by mouth every 4 (four) hours as needed for severe pain (pain score 7-10).   RELION PEN NEEDLES 31G X 6 MM MISC USE ONE NEEDLE DAILY TO INJECT INSULIN    rosuvastatin  (CRESTOR ) 5 MG tablet TAKE 1 TABLET BY MOUTH ON MONDAY, WEDNESDAY,  FRIDAY AND SATURDAY   spironolactone  (ALDACTONE ) 25 MG tablet Take 1 tablet by mouth once daily   TART CHERRY PO Take 2 tablets by mouth 3 (three) times daily.   TRESIBA  FLEXTOUCH 100 UNIT/ML FlexTouch Pen Inject 44 Units into the skin at bedtime.   valsartan  (DIOVAN ) 160 MG tablet Take 1 tablet (160 mg total) by mouth daily.   Vitamin A  3 MG (10000 UT) TABS Take 10,000 Units by mouth daily.   vitamin E 180 MG (400 UNITS) capsule Take 800 Units by mouth daily.    Facility-Administered Encounter Medications as of 04/04/2024  Medication   gentamicin  (GARAMYCIN ) 330 mg in dextrose  5 % 100 mL IVPB   ALLERGIES: Allergies  Allergen Reactions   Latex Other (See Comments) and Itching    skin gets raw   Ambien [Zolpidem Tartrate] Other (See Comments)    Side effect lost a couple days; memory  loss   Cefzil  [Cefprozil ] Diarrhea   Citalopram      Other reaction(s): Not available   Codeine Itching and Nausea And Vomiting   Demerol  [Meperidine ] Other (See Comments)    messes up senses   Formaldehyde    Sulfa Antibiotics Other (See Comments)    Severe abd cramp   Zolpidem     Other reaction(s): Not available   Celexa  [Citalopram  Hydrobromide] Other (See Comments)    Mouth sores    VACCINATION STATUS: Immunization History  Administered Date(s) Administered   Fluad Quad(high Dose 65+) 01/22/2020, 02/10/2021, 01/22/2022   Fluad Trivalent(High Dose 65+) 01/26/2023   INFLUENZA, HIGH DOSE SEASONAL PF 04/15/2018, 01/13/2019, 01/03/2024   Influenza,inj,Quad PF,6+ Mos 03/02/2016, 03/03/2017   Influenza-Unspecified 04/15/2018, 01/15/2019, 01/23/2020   Moderna Sars-Covid-2 Vaccination 05/23/2019, 06/19/2019, 03/05/2020   Pneumococcal Conjugate-13 05/22/2014   Pneumococcal Polysaccharide-23 03/03/2017   Tdap 04/18/2020   Zoster Recombinant(Shingrix ) 12/08/2018, 04/27/2019    HPI Kristen Mack is 79 y.o. female who is accompanied by her daughter to clinic.  She usually follows in this clinic for stable, nonfunctioning 1.5 cm left adrenal adenoma which was previously worked up completely.  Her previsit MRI of abdomen showed stable left adrenal adenoma, however enlarging pancreatic cyst which grew from 1.4 cm to 2.8 cm since 2019. She also has hypothyroidism on levothyroxine  125 mcg p.o. daily, osteoporosis, type 2 diabetes followed in this clinic.  She remains on Tresiba  44 units nightly.  She presents with her freestyle libre CGM device showing 96% time in range, 4% level 1 hyperglycemia.  She did not document or report hypoglycemia.  Her point-of-care A1c is 6.8% today with glucose variability of 13.7%.  -  Her previsit thyroid  function tests are consistent with appropriate replacement.    She has no new complaints today.  She is also here to get her next  Jubbonti  injection  to treat osteoporosis.  She has tolerated Prolia  treatment before.  -Her  previous 24-hour urine function studies for adrenal function have been within normal limits .  -She denies history of difficult to control hypertension, in fact has history of orthostatic hypotension.  She denies spells of palpitations, headaches, sweating. -She denies close  family history of pituitary, adrenal dysfunctions.  Review of Systems  Limited as above.  Objective:    BP 136/80   Pulse 68   Ht 4' 11 (1.499 m)   Wt 238 lb 12.8 oz (108.3 kg)   BMI 48.23 kg/m   Wt Readings from Last 3 Encounters:  04/04/24 238 lb 12.8 oz (108.3 kg)  01/20/24 232 lb (105.2 kg)  01/03/24 232 lb (105.2 kg)    Recent Results (from the past 2160 hours)  OPHTHALMOLOGY REPORT-SCANNED     Status: None   Collection Time: 02/02/24  1:02 PM  Result Value Ref Range   HM Diabetic Eye Exam No Retinopathy No Retinopathy    Comment: ABS BY HIM   A Comment    HgB A1c  Status: None   Collection Time: 04/04/24  2:36 PM  Result Value Ref Range   Hemoglobin A1C     HbA1c POC (<> result, manual entry)     HbA1c, POC (prediabetic range)     HbA1c, POC (controlled diabetic range) 6.8 0.0 - 7.0 %    Diabetic Labs (most recent): Lab Results  Component Value Date   HGBA1C 6.8 04/04/2024   HGBA1C 6.2 06/28/2023   HGBA1C 7.1 (A) 12/14/2022   MICROALBUR 0.4 05/07/2014   MICROALBUR 1.74 08/09/2013     Lipid Panel ( most recent) Lipid Panel     Component Value Date/Time   CHOL 142 12/27/2023 1221   TRIG 123 12/27/2023 1221   HDL 40 12/27/2023 1221   CHOLHDL 3.6 12/27/2023 1221   CHOLHDL 3.3 05/07/2014 1122   VLDL 24 05/07/2014 1122   LDLCALC 80 12/27/2023 1221          MRI of abdomen on May 04, 2017:  Normal right adrenal gland. Left adrenal nodule on the order of 1.5 cm is consistent with an adenoma.   Surveillance abdominal MRI from December 25, 2019 IMPRESSION: 1. Motion degraded exam shows multiple  tiny cystic lesions in the pancreas. Dominant lesion has minimally increased from 14 mm to 17 mm in the 2.5 year interval since previous MRI. No gross soft tissue component or abnormal enhancement evident on today's markedly motion degraded exam. At 17 mm, consensus guidelines recommend repeat imaging every 6 months for 2 years to ensure stability. This recommendation follows ACR consensus guidelines: Management of Incidental Pancreatic Cysts: A White Paper of the ACR Incidental Findings Committee. J Am Coll Radiol 2017;14:911-923. 2. Additional tiny 6 mm cystic foci in the body/tail region are new. Attention on follow-up recommended. 3. Abnormal fluid in the endometrial cavity of the uterus. Pelvic ultrasound for further evaluation. 4. Stable left adrenal adenoma. 5. Small hiatal hernia.   MRI of abdomen/pelvis on July 15, 2020 IMPRESSION: Motion degraded images.   16 mm unilocular pancreatic cyst, unchanged. This favors a benign pseudocyst or side branch IPMN and is of questionable clinical significance. Consider follow-up MR or CT abdomen with/without contrast in 1 year, as clinically warranted. (MR is generally favored, but CT may be preferred given motion concerns).   Suspected endometrial soft tissue/thickening, incompletely visualized/evaluated. This continues to raise concern for early endometrial neoplasm. GYN consultation is suggested.   These results will be called to the ordering clinician or representative by the Radiologist Assistant, and communication documented in the PACS or [employer].  MRI of abdomen with and without contrast February 10, 2024  CLINICAL DATA:  F/U for pancreatic Cyst and Left Adrenal Adenoma Dx: Adenoma of left adrenal gland D35.02 (ICD-10-CM)   EXAM: MRI ABDOMEN WITHOUT AND WITH CONTRAST   TECHNIQUE: Multiplanar multisequence MR imaging of the abdomen was performed both before and after the administration of intravenous  contrast.   CONTRAST:  10mL GADAVIST  GADOBUTROL  1 MMOL/ML IV SOLN   COMPARISON:  MRI abdomen from 07/15/2020.   FINDINGS: Lower chest: Unremarkable MR appearance to the lung bases. No pleural effusion. No pericardial effusion. Normal heart size.   Hepatobiliary: The liver is moderately enlarged in size measuring up to 18.7 cm in length. Noncirrhotic configuration. There are several scattered subcentimeter sized simple cysts throughout the liver. No intrahepatic or extrahepatic bile duct dilatation. No choledocholithiasis. Status post cholecystectomy.   Pancreas: There is mild diffuse atrophy of pancreas. Redemonstration of lobulated T2 hyperintense cystic nonenhancing lesion in the pancreatic head/neck  region. The lesion measures 2.1 x 2.2 x 2.8 cm, which previously measured 1.4 x 1.7 x 1.7 cm. There is no abnormal enhancement. No mural nodule within. Communication of the lesion with pancreatic side branch or main duct can not be established on this exam. However, this is favored to represent pancreatic side-branch IPMN. Considering the growth, the lesion meets the criteria for EUS/FNA. However, correlate clinically.   There are multiple additional smaller similar characteristic lesions in the pancreatic head/uncinate process as well as pancreatic tail, which has also increased in size and number since the prior study. Multiple of these lesions exhibit communication with the pancreatic side branches and are also favored to represent pancreatic side-branch IPMN.   No peripancreatic fat stranding. Main pancreatic duct is nondilated.   Spleen: Within normal limits in size and appearance. No focal mass.   Adrenals/Urinary Tract: Unremarkable right adrenal gland. There is a well-circumscribed 1.8 x 2.1 cm nodule in the left adrenal gland, which can be characterized as an adenoma.x No hydroureteronephrosis. No suspicious renal mass.   Stomach/Bowel: Visualized portions within the  abdomen are unremarkable. No disproportionate dilation of bowel loops.   Vascular/Lymphatic: No pathologically enlarged lymph nodes identified. No abdominal aortic aneurysm demonstrated. No ascites.   Other:  None.   Musculoskeletal: No suspicious bone lesions identified.   IMPRESSION: 1. There is interval increase in the size of the previously seen pancreatic head/neck region cystic lesion, which now measures 2.1 x 2.2 x 2.8 cm. There are multiple additional smaller cystic lesions throughout the pancreas, which have also increased in size and number since the prior study. These are favored to represent pancreatic side-branch IPMN. Correlate clinically. The largest lesion meets the criteria for EUS/FNA. Alternatively, continued follow-up can also be performed, as clinically indicated. 2. Multiple other nonacute observations, as described above.     Electronically Signed   By: Ree Molt M.D.   On: 02/10/2024 17:10  Assessment & Plan:   1.  Type 2 diabetes-  Based on her presentation with controlled glycemic profile and point-of-care A1c was 6.8.  She will not need prandial insulin  for now.    She remains on Tresiba  44 units nightly.  She presents with her freestyle libre CGM device showing 96% time in range, 4% level 1 hyperglycemia.  She did not document or report hypoglycemia.  Her point-of-care A1c is 6.8% today with glucose variability of 13.7%.   It appears that she was taken off of Jardiance  since her last visit.  And this patient worries about cost of medications and would like to avoid prescription for GLP-1 receptor agonists.   Patient stands to benefit from lifestyle medicine, however she states that whole food , plant-based diet will not work for her.    - she acknowledges that there is a room for improvement in her food and drink choices. - Suggestion is made for her to avoid simple carbohydrates  from her diet including Cakes, Sweet Desserts, Ice Cream, Soda  (diet and regular), Sweet Tea, Candies, Chips, Cookies, Store Bought Juices, Alcohol , Artificial Sweeteners,  Coffee Creamer, and Sugar-free Products, Lemonade. This will help patient to have more stable blood glucose profile and potentially avoid unintended weight gain.    She will not need prandial insulin  for now.  She is advised to continue Tresiba  44 units nightly,  associated with continuous utility of her CGM.     She is advised to call clinic for hypoglycemia below 70 or hyperglycemia greater than 200 mg per DL.  2.  Adenoma of left adrenal gland -She did not have any subsequent dedicated adrenal imaging since 2022.   The left adrenal gland is reported to have mild thickening, right adrenal gland is normal.    -Her previsit plasma metanephrines are within normal limits.   -She has previously undergone 24-hour urine studies for catecholamines, metanephrines, cortisol, and aldosterone, which are all within normal limits.  -This is consistent with nonfunctioning left adrenal adenoma of 1.5 cm, stable on imaging studies.  Her repeat MRI of abdomen shows stable left adrenal adenoma, however progressively enlarging pancreatic cyst.  The recommendation has been either EUS biopsy or watchful repeat imaging in short order. She does have GI appointment next week, advised her to address this concern with them. -I will also approach her for repeat MRI in February 2026 to monitor the pancreatic cyst. -She will not need antiadrenal  intervention at this time.    3.  Hypothyroidism Her previsit thyroid  function tests are consistent with appropriate replacement.  She is advised to continue levothyroxine  125 mcg p.o. daily before breakfast.     - We discussed about the correct intake of her thyroid  hormone, on empty stomach at fasting, with water , separated by at least 30 minutes from breakfast and other medications,  and separated by more than 4 hours from calcium , iron, multivitamins, acid reflux  medications (PPIs). -Patient is made aware of the fact that thyroid  hormone replacement is needed for life, dose to be adjusted by periodic monitoring of thyroid  function tests.    4)  Osteoporosis:   She will receive her first Jubbonti   injection October 2025.  Her next treatment is due in April 2026 during her next visit in this clinic.     She is well-informed that this treatment should not be interrupted  without appropriate backup/replacement to avoid withdrawal fracture.     - I advised patient to maintain close follow up with Alphonsa Glendia LABOR, MD for primary care needs.   I spent  42  minutes in the care of the patient today including review of labs from CMP, Lipids, Thyroid  Function, Hematology (current and previous including abstractions from other facilities); face-to-face time discussing  her blood glucose readings/logs, discussing hypoglycemia and hyperglycemia episodes and symptoms, medications doses, her options of short and long term treatment based on the latest standards of care / guidelines;  discussion about incorporating lifestyle medicine;  and documenting the encounter. Risk reduction counseling performed per USPSTF guidelines to reduce  obesity and cardiovascular risk factors.     Please refer to Patient Instructions for Blood Glucose Monitoring and Insulin /Medications Dosing Guide  in media tab for additional information. Please  also refer to  Patient Self Inventory in the Media  tab for reviewed elements of pertinent patient history.  Jackolyn CHRISTELLA Mallick participated in the discussions, expressed understanding, and voiced agreement with the above plans.  All questions were answered to her satisfaction. she is encouraged to contact clinic should she have any questions or concerns prior to her return visit. Dear Patient: Feel free to review your progress notes.  If you are reviewing this progress note and have questions about the meaning of /or medical terms being used,  please make a note and address it at your next follow-up appointment.  Medical notes are meant to be a communication tool between medical professionals and require medical terms to be used for efficiency and insurance approval.    Follow up plan: Return in about 3 months (around 07/03/2024), or  MRI abdomen in Feb 2026, for Prolia  During NV, F/U with Pre-visit Labs, Meter/CGM/Logs, A1c here.   Ranny Earl, MD [facility] [address] [place], [place] [address] Phone: [phone]  Fax: [phone]     04/04/2024, 5:11 PM  This note was partially dictated with voice recognition software. Similar sounding words can be transcribed inadequately or may not  be corrected upon review.  "

## 2024-04-09 ENCOUNTER — Ambulatory Visit: Admitting: Gastroenterology

## 2024-04-09 ENCOUNTER — Encounter: Payer: Self-pay | Admitting: Gastroenterology

## 2024-04-09 VITALS — BP 138/78 | HR 92 | Temp 97.8°F | Ht 59.0 in | Wt 238.0 lb

## 2024-04-09 DIAGNOSIS — Z800 Family history of malignant neoplasm of digestive organs: Secondary | ICD-10-CM | POA: Diagnosis not present

## 2024-04-09 DIAGNOSIS — R160 Hepatomegaly, not elsewhere classified: Secondary | ICD-10-CM | POA: Diagnosis not present

## 2024-04-09 DIAGNOSIS — K7581 Nonalcoholic steatohepatitis (NASH): Secondary | ICD-10-CM | POA: Diagnosis not present

## 2024-04-09 DIAGNOSIS — R195 Other fecal abnormalities: Secondary | ICD-10-CM | POA: Diagnosis not present

## 2024-04-09 DIAGNOSIS — K8689 Other specified diseases of pancreas: Secondary | ICD-10-CM | POA: Diagnosis not present

## 2024-04-09 DIAGNOSIS — K219 Gastro-esophageal reflux disease without esophagitis: Secondary | ICD-10-CM

## 2024-04-09 DIAGNOSIS — K760 Fatty (change of) liver, not elsewhere classified: Secondary | ICD-10-CM

## 2024-04-09 DIAGNOSIS — K580 Irritable bowel syndrome with diarrhea: Secondary | ICD-10-CM

## 2024-04-09 DIAGNOSIS — Z860100 Personal history of colon polyps, unspecified: Secondary | ICD-10-CM | POA: Diagnosis not present

## 2024-04-09 NOTE — Progress Notes (Signed)
 "  GI Office Note    Referring Provider: Alphonsa Glendia LABOR, MD Primary Care Physician:  Alphonsa Glendia LABOR, MD  Primary Gastroenterologist: Carlin POUR. Cindie, DO  Chief Complaint   Chief Complaint  Patient presents with   Postivie cologuard     Positive cologuard    History of Present Illness   Kristen Mack is a 79 y.o. female presenting today at the request of Luking, Glendia LABOR, MD for positive Cologuard.   Colonoscopy in August 2004: Normal, repeat in 5 years given positive family history of colon cancer.  EGD November 2004: - Grade 3 reflux esophagitis - Normal stomach, pylorus, and duodenum -Pathology with mild chronic inflammation, negative for celiac. - Advised weight loss and GERD diet, start taking Nexium 40 mg daily.  Underwent liver biopsy in November 2005: Benign hepatic tissue with marked steatosis and minimal variable portal triad chronic information.  Negative for necrosis.  Last echocardiogram December 2024 with EF 55 to 60% and moderate left ventricular hypertrophy and grade 1 diastolic dysfunction.  No evidence of aortic stenosis.  Cologuard + October 2025  MRI abdomen 02/10/24 IMPRESSION: 1. There is interval increase in the size of the previously seen pancreatic head/neck region cystic lesion, which now measures 2.1 x 2.2 x 2.8 cm. There are multiple additional smaller cystic lesions throughout the pancreas, which have also increased in size and number since the prior study. These are favored to represent pancreatic side-branch IPMN. Correlate clinically. The largest lesion meets the criteria for EUS/FNA. Alternatively, continued follow-up can also be performed, as clinically indicated.  Colonoscopy January 2015: - 2  small polyps ablated via cold biopsy and submitted together (cecum and hepatic flexure). - Small external hemorrhoids - small submucosal lipoma next to ileocecal valve - Path: tubular adenoma without dysplasia - Recommended 5 year repeat.    Today:  Discussed the use of AI scribe software for clinical note transcription with the patient, who gave verbal consent to proceed.  Accompanied by her daughter Amy.   Completed a Cologuard test earlier this year, which was positive. No prior Cologuard testing. Last colonoscopy in January 2015 revealed two small polyps, a small colonic lipoma, and hemorrhoids. Expresses reluctance to undergo another colonoscopy due to difficulty with bowel preparation. Currently asymptomatic for GI bleeding, and no changes in baseline bowel habits beyond known IBS. Family history notable for a rare colon cancer in her brother, previously described as not typical for hereditary cancer.  Cystic lesion near the pancreatic head was discovered incidentally on imaging. Most recent MRI in November showed interval growth of the largest cystic lesion since 2019, though it remains below the threshold for precise measurement. Additional smaller lesions are present throughout the pancreas. Scheduled for repeat MRI to monitor for further growth of adrenal adenoma and will also follow up pancreatic cyst. Aware that further evaluation may be indicated if the lesion remains at or above 2 cm or shows further interval growth.  Longstanding IBS with diarrhea-predominant symptoms, described as soft to liquid stools. Does not take regular medication for IBS and has not required antidiarrheal agents except during an acute norovirus infection over the holidays. No regular nausea, vomiting, or reflux, though she occasionally experiences mild reflux and intermittent nausea, often coinciding with IBS flares. Does not take medication for reflux. Currently sleeps in a chair due to difficulty getting in and out of bed after a left arm fracture in March of the previous year.  No new symptoms related to her liver.  Poor  appetite is longstanding and not new. Diabetes is present, and she finds it challenging to maintain blood sugars due to low  appetite. Additional medical history includes TIA, fibromyalgia, arthritis, and adrenal adenoma under surveillance. Follows with two kidney doctors. Daughter provides additional support and history during the visit.       Wt Readings from Last 6 Encounters:  04/09/24 238 lb (108 kg)  04/04/24 238 lb 12.8 oz (108.3 kg)  01/20/24 232 lb (105.2 kg)  01/03/24 232 lb (105.2 kg)  01/03/24 234 lb 3.2 oz (106.2 kg)  10/25/23 228 lb (103.4 kg)    Body mass index is 48.07 kg/m.  Current Outpatient Medications  Medication Sig Dispense Refill   acetaminophen  (TYLENOL ) 500 MG tablet Take 500 mg by mouth every 6 (six) hours as needed.     albuterol  (PROVENTIL ) (2.5 MG/3ML) 0.083% nebulizer solution Take 3 mLs (2.5 mg total) by nebulization every 6 (six) hours as needed for wheezing or shortness of breath. 75 mL 12   albuterol  (VENTOLIN  HFA) 108 (90 Base) MCG/ACT inhaler INHALE 2 PUFFS BY MOUTH EVERY 4 TO 6 HOURS AS NEEDED FOR SHORTNESS OF BREATH 18 g 0   Azelastine  HCl 137 MCG/SPRAY SOLN 2 sprays each nostrils BID 30 mL 5   B Complex Vitamins (B COMPLEX 100 PO) Take 1 tablet by mouth daily.     blood glucose meter kit and supplies Dispense based on patient and insurance preference. Use to test sugars BID  (FOR ICD-10 E10.9, E11.9). 1 each 0   Blood Pressure Monitoring (RELION BLOOD PRESSURE MONITOR) KIT Use daily to monitor BP 1 kit 0   buPROPion  (WELLBUTRIN  XL) 300 MG 24 hr tablet Take 1 tablet (300 mg total) by mouth daily. 90 tablet 1   Chromium Picolinate 1000 MCG TABS Take 1 tablet by mouth daily.     Coenzyme Q10 (COQ10) 200 MG CAPS Take 200 mg by mouth daily.     Continuous Glucose Receiver (FREESTYLE LIBRE 3 READER) DEVI 1 Piece by Does not apply route once as needed for up to 1 dose. 1 each 0   Continuous Glucose Sensor (FREESTYLE LIBRE 3 PLUS SENSOR) MISC Change sensor every 15 days. 2 each 2   Cyanocobalamin (B-12) 1000 MCG SUBL Place 1,000 mcg under the tongue daily.     denosumab -bbdz  (JUBBONTI ) 60 MG/ML SOSY Inject 60 mg into the skin every 6 (six) months. 1 mL 0   DIGESTIVE ENZYMES PO Take 1 tablet by mouth 3 (three) times daily.      FREESTYLE PRECISION NEO TEST test strip USE 1 STRIP TO CHECK GLUCOSE 4 TIMES DAILY AS DIRECTED 100 each 0   furosemide  (LASIX ) 20 MG tablet TAKE 1 TABLET BY MOUTH ONCE DAILY IN THE MORNING AS NEEDED PEDAL EDEMA 30 tablet 5   gabapentin  (NEURONTIN ) 100 MG capsule Take 1 capsule (100 mg total) by mouth 2 (two) times daily. 60 capsule 5   levothyroxine  (SYNTHROID ) 125 MCG tablet Take 1 tablet (125 mcg total) by mouth daily before breakfast. 90 tablet 3   Lysine HCl 500 MG TABS Take 500 mg by mouth daily.     Magnesium  250 MG TABS Take 250 mg by mouth 2 (two) times daily.     meclizine (ANTIVERT) 25 MG tablet Take 25 mg by mouth 3 (three) times daily as needed for dizziness.     mirabegron  ER (MYRBETRIQ ) 25 MG TB24 tablet Take 1 tablet (25 mg total) by mouth daily. 30 tablet 11   montelukast  (SINGULAIR )  10 MG tablet Take 1 tablet (10 mg total) by mouth at bedtime. 90 tablet 3   nitrofurantoin  (MACRODANTIN ) 50 MG capsule Take 1 capsule (50 mg total) by mouth at bedtime. 30 capsule 11   nitrofurantoin , macrocrystal-monohydrate, (MACROBID ) 100 MG capsule Take 1 capsule (100 mg total) by mouth every 12 (twelve) hours. 14 capsule 0   OVER THE COUNTER MEDICATION Take 1 Scoop by mouth 3 (three) times daily with meals. Fennel seeds     OVER THE COUNTER MEDICATION Choline and inisotol     oxyCODONE  (ROXICODONE ) 5 MG immediate release tablet Take 1 tablet (5 mg total) by mouth every 4 (four) hours as needed for breakthrough pain. 20 tablet 0   oxyCODONE -acetaminophen  (PERCOCET/ROXICET) 5-325 MG tablet Take 1 tablet by mouth every 4 (four) hours as needed for severe pain (pain score 7-10). 6 tablet 0   RELION PEN NEEDLES 31G X 6 MM MISC USE ONE NEEDLE DAILY TO INJECT INSULIN  100 each 0   rosuvastatin  (CRESTOR ) 5 MG tablet TAKE 1 TABLET BY MOUTH ON MONDAY,  WEDNESDAY,  FRIDAY AND SATURDAY 18 tablet 5   spironolactone  (ALDACTONE ) 25 MG tablet Take 1 tablet by mouth once daily 30 tablet 0   TART CHERRY PO Take 2 tablets by mouth 3 (three) times daily.     TRESIBA  FLEXTOUCH 100 UNIT/ML FlexTouch Pen Inject 44 Units into the skin at bedtime. 30 mL 1   valsartan  (DIOVAN ) 160 MG tablet Take 1 tablet (160 mg total) by mouth daily. 90 tablet 1   Vitamin A 3 MG (10000 UT) TABS Take 10,000 Units by mouth daily.     vitamin E 180 MG (400 UNITS) capsule Take 800 Units by mouth daily.      No current facility-administered medications for this visit.   Facility-Administered Medications Ordered in Other Visits  Medication Dose Route Frequency Provider Last Rate Last Admin   gentamicin  (GARAMYCIN ) 330 mg in dextrose  5 % 100 mL IVPB  330 mg Intravenous Q24H Casimir Camelia RAMAN, RPH   330 mg at 08/17/21 1442    Past Medical History:  Diagnosis Date   ADHD (attention deficit hyperactivity disorder) 2007   Adrenal adenoma, left    followed by dr nida   Arthritis    Asthma    followed by pcp   CFS (chronic fatigue syndrome)    CKD (chronic kidney disease), stage III [facility])    nephrologist---  dr marlee   Depression    Edema of both lower extremities    Fatty liver disease, nonalcoholic    Fibromyalgia    GERD (gastroesophageal reflux disease)    Hiatal hernia    History of adenomatous polyp of colon    History of cardiac murmur    until age 14- pt has had echocardiograms   History of TIA (transient ischemic attack)    per CT scan    Hyperlipidemia    Hypertension    Hypothyroidism    endocrinologist-- dr g. nida   IBS (irritable bowel syndrome)    Meniere disease    OSA on CPAP    per pt uses nightly   Osteopenia    Pancreas cyst    followed by pcp   PMB (postmenopausal bleeding)    PVC's (premature ventricular contractions)    cardiologist--- dr barbaraann   Sleep apnea    Type 2 diabetes mellitus treated with insulin  [facility])     endocrinologist-- dr g. nida;    (08-14-2021  per pt checks blood sugar QID ,  fasting average --- 73-150)   Urinary incontinence    Wears glasses    Wears partial dentures    upper    Past Surgical History:  Procedure Laterality Date   CERVICAL POLYPECTOMY N/A 08/17/2021   Procedure: BIOPSY OF ENDOMETRIUM AND OR POLYPECTOMY;  Surgeon: Bettina Muskrat, MD;  Location: [facility];  Service: Gynecology;  Laterality: N/A;   CESAREAN SECTION  1977  &  1979   CHOLECYSTECTOMY  1995   and  REPAIR UMBILICAL HERNIA   COLONOSCOPY N/A 04/19/2013   Procedure: COLONOSCOPY;  Surgeon: Claudis RAYMOND Rivet, MD;  [facility];  Service: Endoscopy;  Laterality: N/A;     DILITATION & CURRETTAGE/HYSTROSCOPY WITH NOVASURE ABLATION N/A 08/17/2021   Procedure: DILATATION & CURETTAGE/HYSTEROSCOPY;  Surgeon: Bettina Muskrat, MD;  Location: [facility];  Service: Gynecology;  Laterality: N/A;   ENDOMETRIAL BIOPSY  11/29/2023   ESOPHAGOGASTRODUODENOSCOPY  02/08/2003   HERNIA REPAIR  1995   HYSTEROSCOPY WITH D & C  11/14/2002   endometrial polypectomy   HYSTEROSCOPY WITH D & C N/A 04/05/2014   Procedure: DILATATION AND CURETTAGE /HYSTEROSCOPY, ENDOMETRIAL POLYPECTOMY;  Surgeon: Muskrat Bettina, MD;  Location: [facility];  Service: Gynecology;  Laterality: N/A;   LIVER BIOPSY  01/29/2004   benign   TUBAL LIGATION  1979    Family History  Problem Relation Age of Onset   Rectal cancer Brother 36   Diabetes Brother    Ovarian cancer Mother 71   Hypertension Mother    Diabetes Mother    Cancer Mother    Depression Mother    Early death Mother    Obesity Mother    Hypertension Father    Diabetes Father    Depression Father    Hyperlipidemia Father    Stroke Father    Breast cancer Maternal Aunt    Breast cancer Paternal Aunt        four Paternal aunts   Heart disease Paternal Grandfather    Hyperlipidemia Paternal Grandfather    Hypertension Paternal  Grandfather    Stroke Paternal Grandfather    Liver disease Sister        x 2; fatty liver   Hearing loss Maternal Grandfather    Hypertension Maternal Grandfather    Heart disease Maternal Grandmother    Hypertension Maternal Grandmother    Obesity Maternal Grandmother    Obesity Paternal Grandmother    ADD / ADHD Daughter    Depression Daughter    ADD / ADHD Daughter    Depression Daughter    Alcohol abuse Maternal Uncle    Depression Maternal Uncle    Drug abuse Maternal Uncle    Early death Maternal Uncle    Alcohol abuse Paternal Uncle    Depression Paternal Uncle    Drug abuse Paternal Uncle    Anxiety disorder Sister    Depression Sister    Hyperlipidemia Sister    Miscarriages / Stillbirths Sister    Cancer Brother    Depression Brother    Early death Brother    Cancer Maternal Aunt    Cancer Maternal Aunt    Cancer Paternal Aunt    Cancer Paternal Aunt    Cancer Paternal Aunt    Cancer Paternal Aunt    Drug abuse Brother    Heart disease Maternal Uncle    Hyperlipidemia Maternal Uncle    Hypertension Maternal Uncle    Hyperlipidemia Sister    Hyperlipidemia Brother    Hyperlipidemia Brother  Hypertension Maternal Uncle    Pancreatic cancer Neg Hx    Esophageal cancer Neg Hx     Allergies as of 04/09/2024 - Review Complete 04/09/2024  Allergen Reaction Noted   Latex Other (See Comments) and Itching 04/03/2014   Ambien [zolpidem tartrate] Other (See Comments) 07/01/2012   Cefzil  [cefprozil ] Diarrhea 03/02/2016   Citalopram   01/04/2022   Codeine Itching and Nausea And Vomiting 10/04/2011   Demerol  [meperidine ] Other (See Comments) 04/19/2013   Formaldehyde  05/10/2017   Sulfa antibiotics Other (See Comments) 10/04/2011   Zolpidem  01/04/2022   Celexa  [citalopram  hydrobromide] Other (See Comments) 08/14/2013    Social History   Socioeconomic History   Marital status: Married    Spouse name: Lamar   Number of children: 2   Years of education:  Not on file   Highest education level: Associate degree: occupational, scientist, product/process development, or vocational program  Occupational History   Occupation: retired, [employer]  Tobacco Use   Smoking status: Former    Current packs/day: 0.00    Average packs/day: 1.5 packs/day for 3.0 years (4.5 ttl pk-yrs)    Types: Cigarettes    Start date: 10/03/1965    Quit date: 10/03/1968    Years since quitting: 55.5    Passive exposure: Past   Smokeless tobacco: Never  Vaping Use   Vaping status: Never Used  Substance and Sexual Activity   Alcohol use: No   Drug use: No   Sexual activity: Not Currently    Birth control/protection: None  Other Topics Concern   Not on file  Social History Narrative   2 daughters, Amy and Rosaline. Amy lives with parents. Rosaline lives in [place].   Social Drivers of Health   Tobacco Use: Medium Risk (04/09/2024)   Patient History    Smoking Tobacco Use: Former    Smokeless Tobacco Use: Never    Passive Exposure: Past  Physicist, Medical Strain: Low Risk (01/20/2024)   Overall Financial Resource Strain (CARDIA)    Difficulty of Paying Living Expenses: Not very hard  Food Insecurity: Food Insecurity Present (01/20/2024)   Epic    Worried About Programme Researcher, Broadcasting/film/video in the Last Year: Sometimes true    Ran Out of Food in the Last Year: Never true  Transportation Needs: No Transportation Needs (01/20/2024)   Epic    Lack of Transportation (Medical): No    Lack of Transportation (Non-Medical): No  Physical Activity: Inactive (01/20/2024)   Exercise Vital Sign    Days of Exercise per Week: 0 days    Minutes of Exercise per Session: 0 min  Stress: No Stress Concern Present (01/20/2024)   [employer] of Occupational Health - Occupational Stress Questionnaire    Feeling of Stress: Not at all  Social Connections: Moderately Isolated (01/20/2024)   Social Connection and Isolation Panel    Frequency of Communication with Friends and Family: More than three times a week     Frequency of Social Gatherings with Friends and Family: Once a week    Attends Religious Services: Never    Database Administrator or Organizations: No    Attends Banker Meetings: Never    Marital Status: Married  Catering Manager Violence: Not At Risk (01/20/2024)   Epic    Fear of Current or Ex-Partner: No    Emotionally Abused: No    Physically Abused: No    Sexually Abused: No  Depression (PHQ2-9): Medium Risk (01/20/2024)   Depression (PHQ2-9)    PHQ-2 Score: 6  Alcohol Screen: Low Risk (01/20/2024)   Alcohol Screen    Last Alcohol Screening Score (AUDIT): 0  Housing: Low Risk (01/20/2024)   Epic    Unable to Pay for Housing in the Last Year: No    Number of Times Moved in the Last Year: 0    Homeless in the Last Year: No  Utilities: Not At Risk (01/20/2024)   Epic    Threatened with loss of utilities: No  Health Literacy: Adequate Health Literacy (01/20/2024)   B1300 Health Literacy    Frequency of need for help with medical instructions: Never    Review of Systems   Gen: + lack of appetite. Denies any fever, chills, fatigue, weight loss  CV: Denies chest pain, heart palpitations, peripheral edema, syncope.  Resp: Denies shortness of breath at rest or with exertion. Denies wheezing or cough.  GI: see HPI GU : + urinary frequency. Denies urinary burning, urinary hesitancy MS: + joint pain and limited movement. Denies muscle weakness, cramps Psych: Denies depression, anxiety, memory loss, and confusion Heme: Denies bruising, bleeding, and enlarged lymph nodes.  Physical Exam   BP 138/78 (BP Location: Right Arm, Patient Position: Sitting, Cuff Size: Large)   Pulse 92   Temp 97.8 F (36.6 C) (Temporal)   Ht 4' 11 (1.499 m)   Wt 238 lb (108 kg)   BMI 48.07 kg/m   General:   Alert and oriented. Pleasant and cooperative.  Head:  Normocephalic and atraumatic. Eyes:  Without icterus, sclera clear and conjunctiva pink.  Ears:  Hard of hearing.   Lungs:  Clear to auscultation bilaterally. No wheezes, rales, or rhonchi. No distress.  Heart:  S1, S2 present without murmurs appreciated.  Abdomen:  +BS, soft, non-tender and non-distended. No HSM noted. No guarding or rebound. No masses appreciated. Exam limited as not able to get on exam table and sitting in rolling walker.  Rectal:  deferred Msk:  UTA Neurologic:  Alert and  oriented x4;  grossly normal neurologically. Skin:  Intact without significant lesions or rashes. Psych:  Alert and cooperative. Normal mood and affect.  Assessment & Plan   Kristen Mack is a 79 y.o. female with a history of TIA, fibromyalgia, arthritis, OSA and CPAP, Menieres, HLD, HTN, hypothyroidism, GERD, pancreatic cyst, depression, CKD Stage 3, fatty liver, diabetes type 2, and adrenal adenoma under surveillance presenting today to discuss positive Cologuard and abnormal pancreas on MRI.      History of colonic polyps with positive cologuard, family history of colon cancer Increased risk for recurrent polyps or neoplasia given personal history and positive Cologuard. The positive Cologuard may represent a false positive, but the likelihood of polyps is elevated in this context. Risks and benefits of colonoscopy versus observation/Cologuard were reviewed.She would like to discuss whether or not colonoscopy will be done with the rest of her family. She indicates her understanding of missing potential malignancy or polyps by not performing procedure in the setting of personal history and family history.  - Discussed need for follow-up colonoscopy given positive Cologuard and prior polyps and family history.  - Reviewed risks and benefits of colonoscopy and alternative approaches. - Advised her to discuss with family and contact the office if she elects to proceed with colonoscopy; will provide orders and instructions at that time.  Pancreatic cystic lesion with interval growth Chronic pancreatic cystic  lesion with interval growth over several years, not currently meeting high-risk criteria. Further evaluation with repeat MRI is warranted to determine need for  EUS with FNA. Further growth or size =2 cm would prompt referral for advanced endoscopic evaluation (EUS). - Advised her to notify the office when repeat MRI is scheduled and completed. - Plan to review MRI results approximately one week after completion to determine if referral for EUS with FNA is indicated. - If lesion is =2 cm or shows further growth, will refer to Dr. Bard in [place] for EUS with FNA.  Irritable bowel syndrome with diarrhea Longstanding, diarrhea-predominant IBS without alarming features or need for regular antidiarrheal therapy. No evidence of gastrointestinal bleeding. - Provided reassurance regarding current management.  Nonalcoholic fatty liver disease with mild hepatomegaly Chronic MASLD with mild hepatomegaly and simple cysts on imaging. No evidence of progression or mass effect. Prior workup for elevated LFTs unrevealing, recently more normalized. Fib 4 low for her age.  - Reviewed recent MRI findings showing mild hepatomegaly and simple cysts. - Continue current monitoring.  - low fat diet, heart healthy diet.   GERD - Advised to consider over-the-counter famotidine if intermittent reflux or nausea becomes more frequent or bothersome.      Follow up   Follow up to be determined pending follow up imaging and discussion with family.    Charmaine Melia, MSN, FNP-BC, AGACNP-BC [facility] "

## 2024-04-09 NOTE — Patient Instructions (Signed)
 Given your positive Cologuard please let me know after you discuss with family about what you would like to do in regards to colonoscopy.  As we discussed there is a 12-15% of false positives and false negatives with these test and given your family history of colon cancer in your brother and your personal history of colon polyps this does put you at a higher risk therefore colonoscopy is the gold standard.  Please let me know when you are having your follow-up MRI of your abdomen so I can follow-up on these results to to see if we need to get you referred to discuss an endoscopic ultrasound with fine-needle aspiration to further evaluate the growing pancreatic cyst.  Sending a MyChart message would be just fine.  If you began having any frequent reflux symptoms you could add famotidine 20 mg to your regimen to help with this, you do not certainly have to take it every day but if you eat a particular food that you know causes some reflux symptoms you can take this about 30 minutes prior (maximum dose that you can take in a day is 40 mg).   Follow a GERD diet:  Avoid fried, fatty, greasy, spicy, citrus foods. Avoid caffeine and carbonated beverages. Avoid chocolate. Try eating 4-6 small meals a day rather than 3 large meals. Do not eat within 3 hours of laying down. Prop head of bed up on wood or bricks to create a 6 inch incline.  Please monitor for bowel changes, bright red blood in your stool, black/tarry stool, changes in appetite or significant weight loss without trying, excetra.  I am not going to schedule him follow-up with you, I would like for you to let me know what you decide and then we can determine if any regular follow-up is needed.  It was a pleasure to see you today. I want to create trusting relationships with patients. If you receive a survey regarding your visit,  I greatly appreciate you taking time to fill this out on paper or through your MyChart. I value your  feedback.  Charmaine Melia, MSN, FNP-BC, AGACNP-BC [facility]

## 2024-04-12 ENCOUNTER — Ambulatory Visit (HOSPITAL_COMMUNITY)
Admission: RE | Admit: 2024-04-12 | Discharge: 2024-04-12 | Disposition: A | Discharge status: Home / Self Care | Source: Ambulatory Visit | Attending: "Endocrinology | Admitting: "Endocrinology

## 2024-04-12 ENCOUNTER — Other Ambulatory Visit: Payer: Self-pay | Admitting: "Endocrinology

## 2024-04-12 DIAGNOSIS — E1122 Type 2 diabetes mellitus with diabetic chronic kidney disease: Secondary | ICD-10-CM

## 2024-04-12 DIAGNOSIS — I129 Hypertensive chronic kidney disease with stage 1 through stage 4 chronic kidney disease, or unspecified chronic kidney disease: Secondary | ICD-10-CM | POA: Insufficient documentation

## 2024-04-12 DIAGNOSIS — N183 Chronic kidney disease, stage 3 unspecified: Secondary | ICD-10-CM | POA: Insufficient documentation

## 2024-04-12 DIAGNOSIS — K862 Cyst of pancreas: Secondary | ICD-10-CM

## 2024-04-12 MED ORDER — GADOBUTROL 1 MMOL/ML IV SOLN
10.0000 mL | Freq: Once | INTRAVENOUS | Status: AC | PRN
  Administered 2024-04-12: 10 mL via INTRAVENOUS

## 2024-04-14 ENCOUNTER — Other Ambulatory Visit: Payer: Self-pay | Admitting: "Endocrinology

## 2024-04-14 DIAGNOSIS — E1122 Type 2 diabetes mellitus with diabetic chronic kidney disease: Secondary | ICD-10-CM

## 2024-04-19 ENCOUNTER — Other Ambulatory Visit: Payer: Self-pay

## 2024-04-19 DIAGNOSIS — E1122 Type 2 diabetes mellitus with diabetic chronic kidney disease: Secondary | ICD-10-CM

## 2024-04-19 MED ORDER — TRESIBA FLEXTOUCH 100 UNIT/ML ~~LOC~~ SOPN: 44.0000 [IU] | PEN_INJECTOR | Freq: Every day | SUBCUTANEOUS | 0 refills | Status: AC

## 2024-06-13 ENCOUNTER — Ambulatory Visit: Admitting: Urology

## 2024-07-03 ENCOUNTER — Ambulatory Visit: Admitting: Family Medicine

## 2024-07-05 ENCOUNTER — Ambulatory Visit: Admitting: "Endocrinology
